# Patient Record
Sex: Male | Born: 1940 | Race: White | Hispanic: No | Marital: Married | Smoking: Never smoker
Health system: Southern US, Community
[De-identification: ages and names within clinical notes are randomized; demographics above are authoritative.]

## PROBLEM LIST (undated history)

## (undated) DIAGNOSIS — D472 Monoclonal gammopathy: Secondary | ICD-10-CM

## (undated) DIAGNOSIS — R7303 Prediabetes: Secondary | ICD-10-CM

## (undated) DIAGNOSIS — Z905 Acquired absence of kidney: Secondary | ICD-10-CM

## (undated) DIAGNOSIS — D369 Benign neoplasm, unspecified site: Secondary | ICD-10-CM

## (undated) DIAGNOSIS — C61 Malignant neoplasm of prostate: Secondary | ICD-10-CM

## (undated) DIAGNOSIS — N189 Chronic kidney disease, unspecified: Secondary | ICD-10-CM

## (undated) DIAGNOSIS — I10 Essential (primary) hypertension: Secondary | ICD-10-CM

## (undated) DIAGNOSIS — M1990 Unspecified osteoarthritis, unspecified site: Secondary | ICD-10-CM

## (undated) DIAGNOSIS — E785 Hyperlipidemia, unspecified: Secondary | ICD-10-CM

## (undated) DIAGNOSIS — Z8739 Personal history of other diseases of the musculoskeletal system and connective tissue: Secondary | ICD-10-CM

## (undated) DIAGNOSIS — Z87442 Personal history of urinary calculi: Secondary | ICD-10-CM

## (undated) DIAGNOSIS — I214 Non-ST elevation (NSTEMI) myocardial infarction: Secondary | ICD-10-CM

## (undated) DIAGNOSIS — C9000 Multiple myeloma not having achieved remission: Secondary | ICD-10-CM

## (undated) HISTORY — DX: Monoclonal gammopathy: D47.2

## (undated) HISTORY — DX: Malignant neoplasm of prostate: C61

## (undated) HISTORY — PX: CORONARY ANGIOPLASTY WITH STENT PLACEMENT: SHX49

## (undated) HISTORY — PX: INSERTION PROSTATE RADIATION SEED: SUR718

## (undated) HISTORY — PX: TONSILLECTOMY: SUR1361

## (undated) HISTORY — DX: Essential (primary) hypertension: I10

## (undated) HISTORY — DX: Prediabetes: R73.03

## (undated) HISTORY — DX: Hyperlipidemia, unspecified: E78.5

## (undated) HISTORY — DX: Benign neoplasm, unspecified site: D36.9

## (undated) HISTORY — DX: Chronic kidney disease, unspecified: N18.9

---
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
## 1982-05-13 HISTORY — PX: NEPHRECTOMY: SHX65

## 1982-05-13 HISTORY — PX: CARDIAC CATHETERIZATION: SHX172

## 1991-05-14 DIAGNOSIS — D369 Benign neoplasm, unspecified site: Secondary | ICD-10-CM

## 1991-05-14 HISTORY — DX: Benign neoplasm, unspecified site: D36.9

## 2002-08-14 ENCOUNTER — Emergency Department (HOSPITAL_COMMUNITY): Admission: EM | Admit: 2002-08-14 | Discharge: 2002-08-14 | Payer: Self-pay | Admitting: Emergency Medicine

## 2002-08-14 ENCOUNTER — Encounter: Payer: Self-pay | Admitting: Emergency Medicine

## 2004-05-13 DIAGNOSIS — C61 Malignant neoplasm of prostate: Secondary | ICD-10-CM

## 2004-05-13 HISTORY — DX: Malignant neoplasm of prostate: C61

## 2005-01-29 ENCOUNTER — Ambulatory Visit: Admission: RE | Admit: 2005-01-29 | Discharge: 2005-04-29 | Payer: Self-pay | Admitting: *Deleted

## 2005-05-17 ENCOUNTER — Ambulatory Visit (HOSPITAL_BASED_OUTPATIENT_CLINIC_OR_DEPARTMENT_OTHER): Admission: RE | Admit: 2005-05-17 | Discharge: 2005-05-17 | Payer: Self-pay | Admitting: Urology

## 2005-07-18 ENCOUNTER — Ambulatory Visit: Admission: RE | Admit: 2005-07-18 | Discharge: 2005-08-30 | Payer: Self-pay | Admitting: *Deleted

## 2009-06-22 ENCOUNTER — Ambulatory Visit: Payer: Self-pay | Admitting: Oncology

## 2009-06-26 LAB — COMPREHENSIVE METABOLIC PANEL
Albumin: 4 g/dL (ref 3.5–5.2)
BUN: 17 mg/dL (ref 6–23)
Chloride: 103 mEq/L (ref 96–112)
Creatinine, Ser: 1.48 mg/dL (ref 0.40–1.50)
Glucose, Bld: 162 mg/dL — ABNORMAL HIGH (ref 70–99)
Potassium: 4.2 mEq/L (ref 3.5–5.3)

## 2009-06-26 LAB — CBC WITH DIFFERENTIAL/PLATELET
Basophils Absolute: 0 10*3/uL (ref 0.0–0.1)
Eosinophils Absolute: 0.2 10*3/uL (ref 0.0–0.5)
HCT: 46.3 % (ref 38.4–49.9)
HGB: 15.9 g/dL (ref 13.0–17.1)
LYMPH%: 32.6 % (ref 14.0–49.0)
MCH: 30.2 pg (ref 27.2–33.4)
MONO#: 0.5 10*3/uL (ref 0.1–0.9)
MONO%: 8.1 % (ref 0.0–14.0)
NEUT#: 3.4 10*3/uL (ref 1.5–6.5)
NEUT%: 55.8 % (ref 39.0–75.0)
lymph#: 2 10*3/uL (ref 0.9–3.3)

## 2009-06-26 LAB — MORPHOLOGY
PLT EST: ADEQUATE
RBC Comments: NORMAL

## 2009-06-28 LAB — TSH: TSH: 1.123 u[IU]/mL (ref 0.350–4.500)

## 2009-06-28 LAB — SPEP & IFE WITH QIG
IgA: 302 mg/dL (ref 68–378)
IgG (Immunoglobin G), Serum: 499 mg/dL — ABNORMAL LOW (ref 694–1618)
IgM, Serum: 29 mg/dL — ABNORMAL LOW (ref 60–263)
M-Spike, %: 0.3 g/dL

## 2009-06-28 LAB — KAPPA/LAMBDA LIGHT CHAINS: Kappa free light chain: <0.6 mg/dL (ref 0.33–1.94)

## 2009-06-28 LAB — VITAMIN B12: Vitamin B-12: 549 pg/mL (ref 211–911)

## 2009-06-30 ENCOUNTER — Ambulatory Visit (HOSPITAL_COMMUNITY): Admission: RE | Admit: 2009-06-30 | Discharge: 2009-06-30 | Payer: Self-pay | Admitting: Internal Medicine

## 2009-06-30 IMAGING — CR DG BONE SURVEY MET
10 series · 10 of 10 positions shown · non-contrast
Comparison: None.

CLINICAL DATA: Abdominal pain and weight loss.  Evaluate for
multiple myeloma.  History of renal cell carcinoma and prostate
cancer.

METASTATIC BONE SURVEY

[w skull lat]
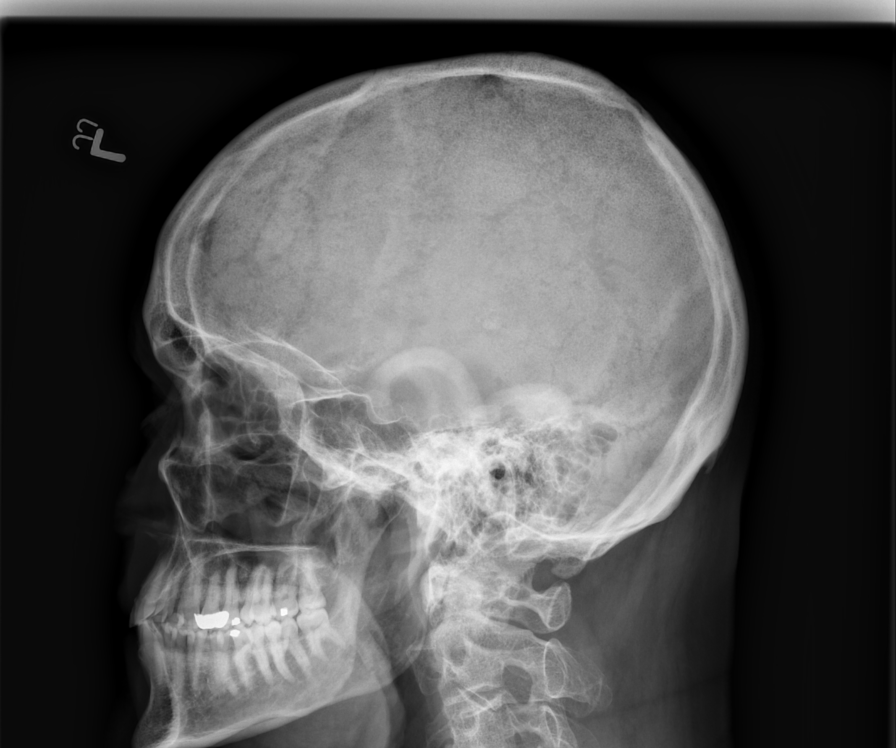

[w c-spine lat]
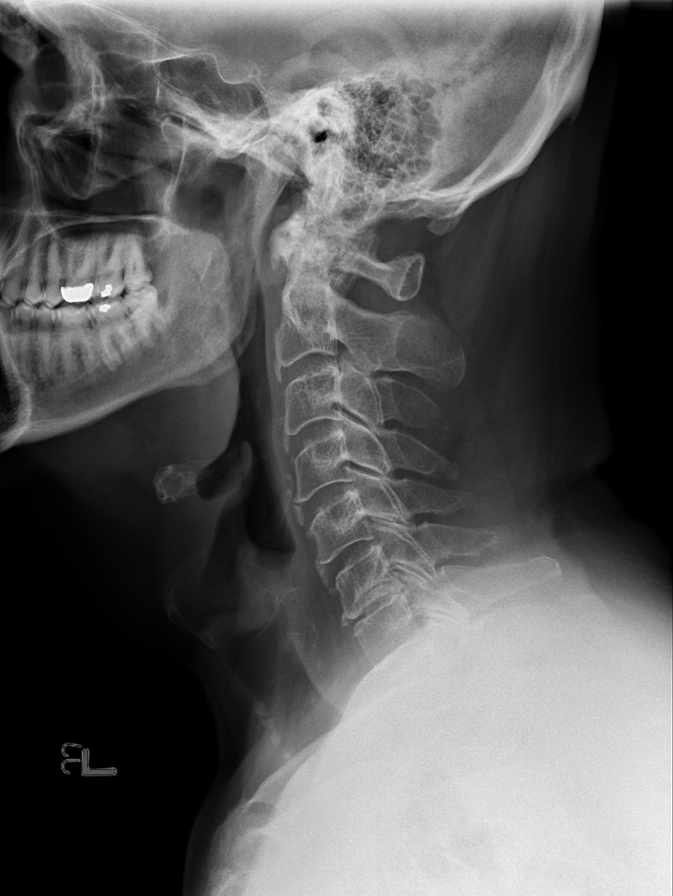

[t c-spine a.p.]
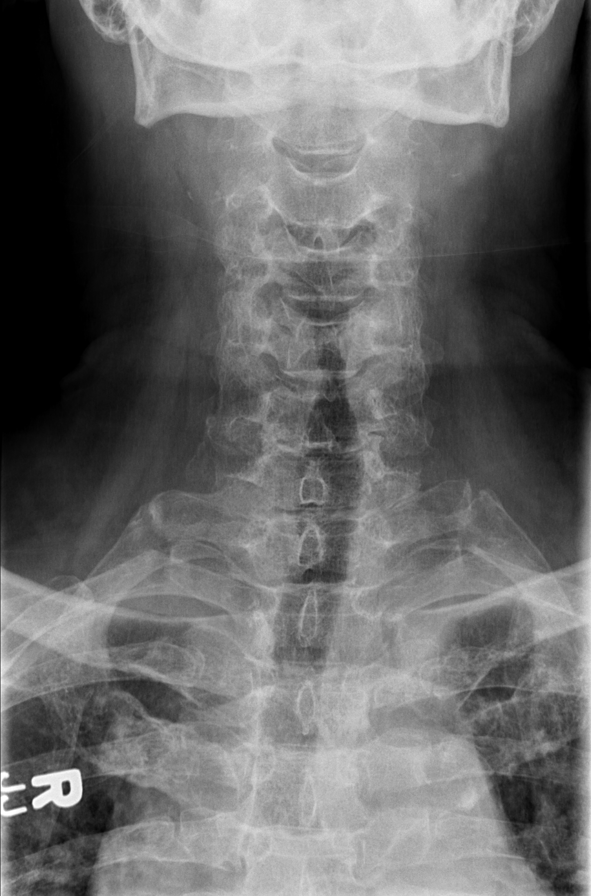

[t t-spine a.p.]
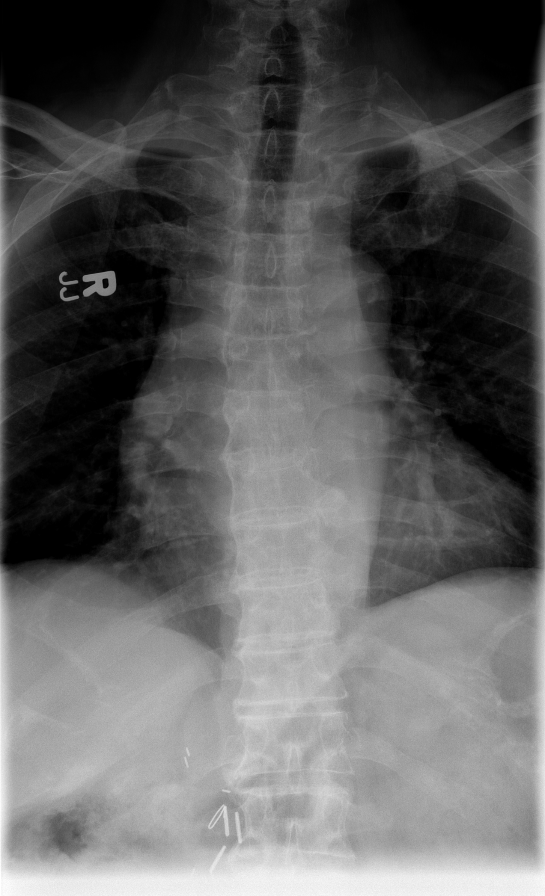

[t l-spine a.p.]
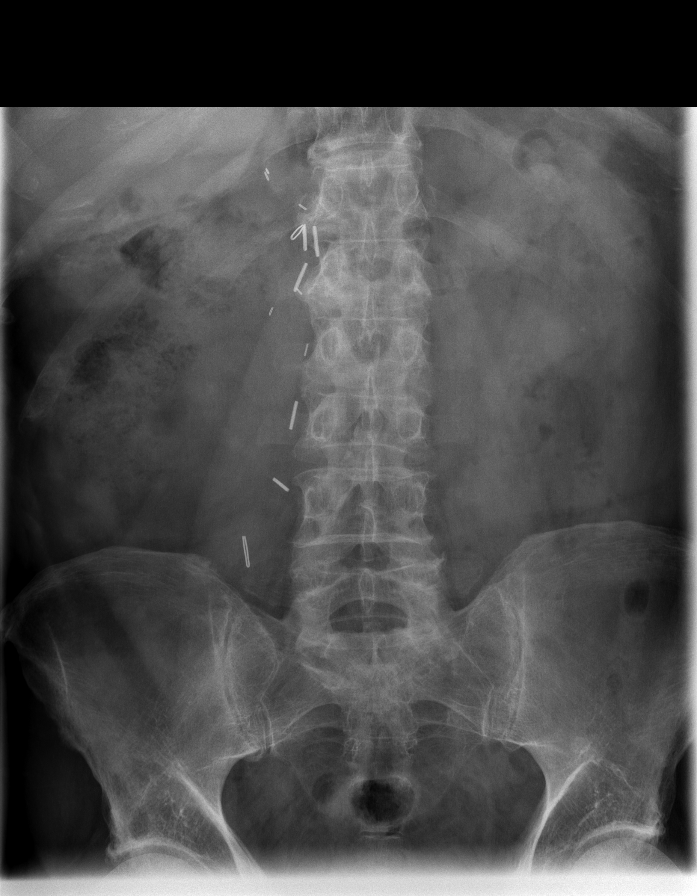

[t pelvis a.p.]
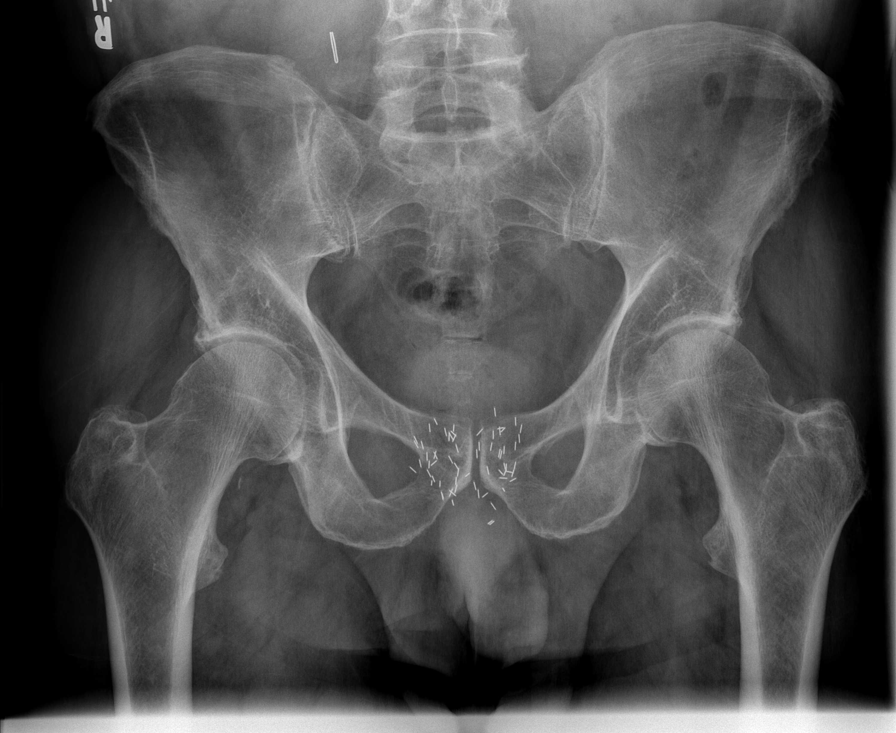

[t femur with hip  ap left]
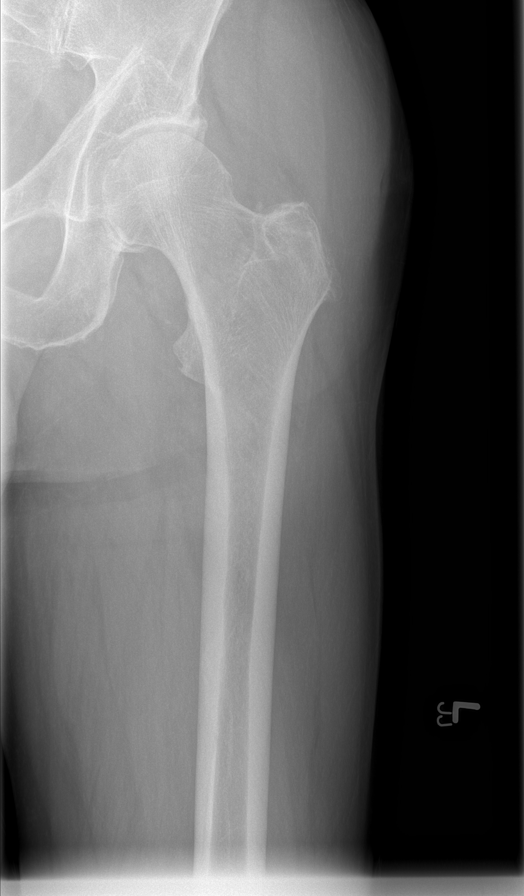

[t femur with knee ap left]
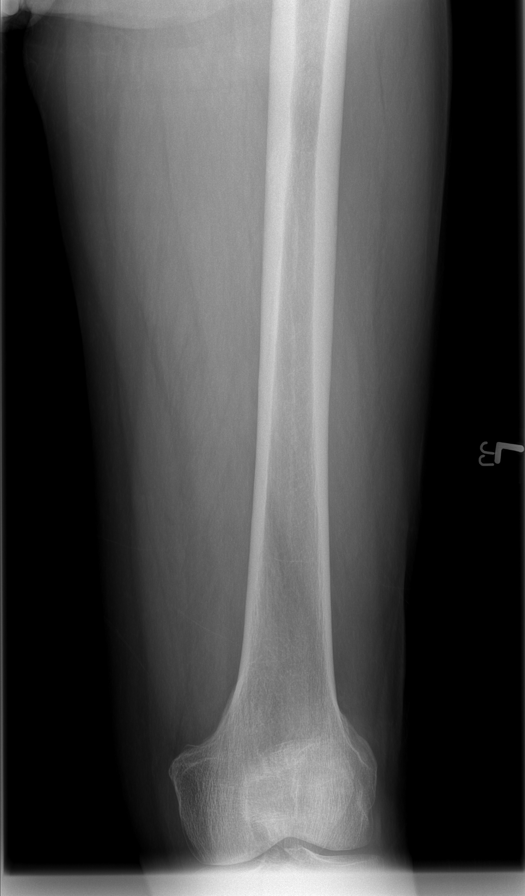

[t femur with hip  ap right]
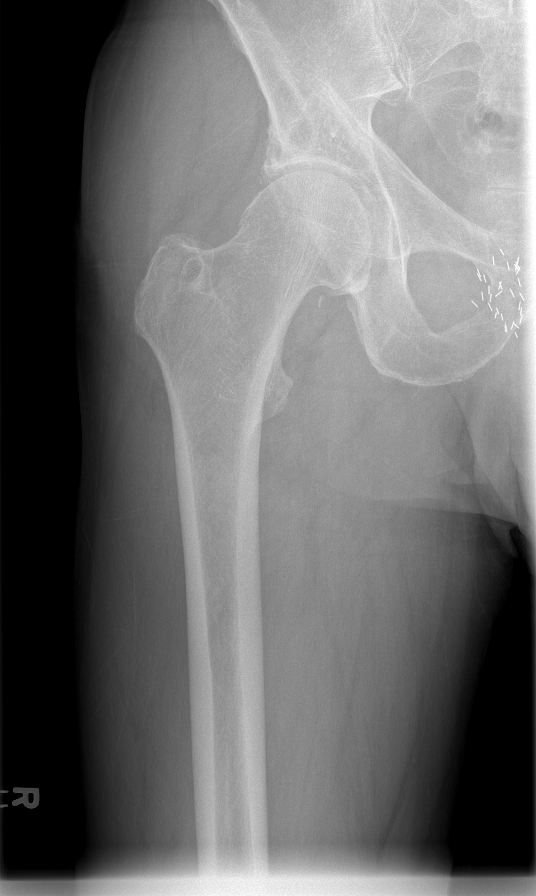

[t femur with knee ap right]
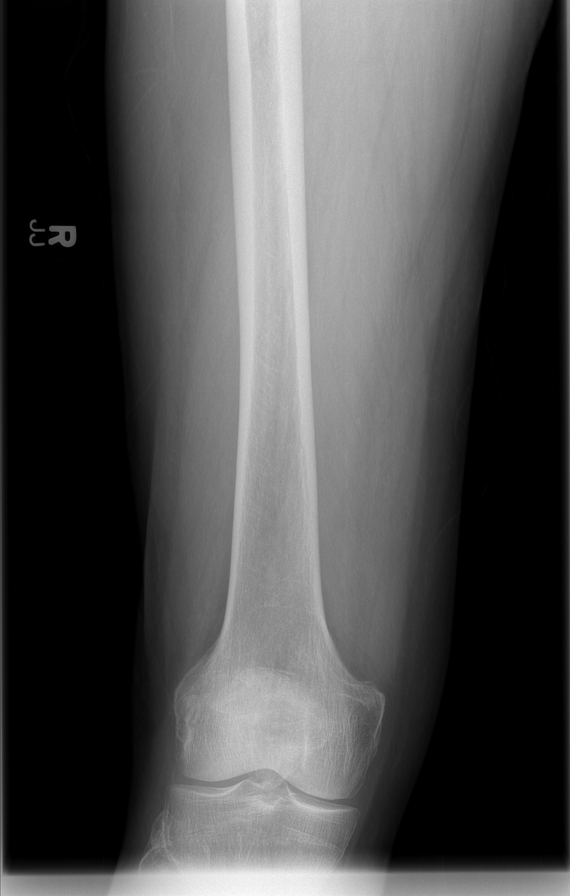

[10 of 10 positions shown; findings below may reference images not displayed]

FINDINGS: They are two small areas of lucency along the frontal and
parietal bones, on the lateral view.  No additional lucent or lytic
lesions in the visualized osseous structures.  Degenerative changes
are seen throughout the spine, with flowing anterior osteophytosis
in the thoracic spine.  Mild degenerative changes are seen in the
hips.  There are brachytherapy seeds in the prostate.
IMPRESSION: Two small lucent areas within the calvarium.  Without prior exams,
it is difficult to exclude lytic lesions.  Artifact is considered.
Attention on follow-up exams is warranted.

## 2009-07-03 LAB — UIFE/LIGHT CHAINS/TP QN, 24-HR UR
Free Kappa/Lambda Ratio: 7.9 ratio — ABNORMAL HIGH (ref 0.46–4.00)
Free Lambda Excretion/Day: 1.94 mg/d
Free Lambda Lt Chains,Ur: 0.21 mg/dL (ref 0.08–1.01)
Time: 24 hours
Total Protein, Urine-Ur/day: 22 mg/d (ref 10–140)
Total Protein, Urine: 2.4 mg/dL

## 2009-08-04 ENCOUNTER — Ambulatory Visit (HOSPITAL_COMMUNITY): Admission: RE | Admit: 2009-08-04 | Discharge: 2009-08-04 | Payer: Self-pay | Admitting: Internal Medicine

## 2009-09-25 ENCOUNTER — Ambulatory Visit: Payer: Self-pay | Admitting: Oncology

## 2009-09-26 LAB — CBC WITH DIFFERENTIAL/PLATELET
Basophils Absolute: 0 10*3/uL (ref 0.0–0.1)
Eosinophils Absolute: 0.1 10*3/uL (ref 0.0–0.5)
LYMPH%: 29.8 % (ref 14.0–49.0)
MCV: 88.3 fL (ref 79.3–98.0)
MONO#: 0.5 10*3/uL (ref 0.1–0.9)
NEUT#: 4 10*3/uL (ref 1.5–6.5)
NEUT%: 60.5 % (ref 39.0–75.0)
RBC: 5.09 10*6/uL (ref 4.20–5.82)
RDW: 13 % (ref 11.0–14.6)
lymph#: 2 10*3/uL (ref 0.9–3.3)

## 2009-09-28 LAB — COMPREHENSIVE METABOLIC PANEL
ALT: 31 U/L (ref 0–53)
Alkaline Phosphatase: 94 U/L (ref 39–117)
Creatinine, Ser: 1.5 mg/dL (ref 0.40–1.50)
Total Bilirubin: 0.6 mg/dL (ref 0.3–1.2)
Total Protein: 6.5 g/dL (ref 6.0–8.3)

## 2009-09-28 LAB — PROTEIN ELECTROPHORESIS, SERUM
Albumin ELP: 65.5 % (ref 55.8–66.1)
Alpha-1-Globulin: 4.5 % (ref 2.9–4.9)
Beta 2: 4.2 % (ref 3.2–6.5)
Beta Globulin: 6.9 % (ref 4.7–7.2)

## 2009-09-28 LAB — IGG, IGA, IGM: IgA: 283 mg/dL (ref 68–378)

## 2010-02-05 ENCOUNTER — Ambulatory Visit: Payer: Self-pay | Admitting: Oncology

## 2010-02-07 LAB — CBC WITH DIFFERENTIAL/PLATELET
BASO%: 0.7 % (ref 0.0–2.0)
Basophils Absolute: 0 10*3/uL (ref 0.0–0.1)
EOS%: 1.5 % (ref 0.0–7.0)
Eosinophils Absolute: 0.1 10*3/uL (ref 0.0–0.5)
HCT: 43.1 % (ref 38.4–49.9)
MCHC: 35 g/dL (ref 32.0–36.0)
MONO#: 0.5 10*3/uL (ref 0.1–0.9)
MONO%: 7 % (ref 0.0–14.0)
NEUT%: 62.7 % (ref 39.0–75.0)
RBC: 4.84 10*6/uL (ref 4.20–5.82)
RDW: 13.9 % (ref 11.0–14.6)
WBC: 6.8 10*3/uL (ref 4.0–10.3)

## 2010-02-13 LAB — PROTEIN ELECTROPHORESIS, SERUM
Alpha-2-Globulin: 8.6 % (ref 7.1–11.8)
Beta Globulin: 6.6 % (ref 4.7–7.2)

## 2010-02-13 LAB — KAPPA/LAMBDA LIGHT CHAINS: Lambda Free Lght Chn: 1.49 mg/dL (ref 0.57–2.63)

## 2010-02-13 LAB — COMPREHENSIVE METABOLIC PANEL
ALT: 16 U/L (ref 0–53)
AST: 16 U/L (ref 0–37)
Creatinine, Ser: 1.59 mg/dL — ABNORMAL HIGH (ref 0.40–1.50)
Potassium: 4.3 mEq/L (ref 3.5–5.3)
Total Bilirubin: 0.6 mg/dL (ref 0.3–1.2)

## 2010-05-24 ENCOUNTER — Ambulatory Visit: Payer: Self-pay | Admitting: Oncology

## 2010-05-28 LAB — CBC WITH DIFFERENTIAL/PLATELET
BASO%: 0.6 % (ref 0.0–2.0)
Basophils Absolute: 0 10*3/uL (ref 0.0–0.1)
EOS%: 1 % (ref 0.0–7.0)
Eosinophils Absolute: 0.1 10*3/uL (ref 0.0–0.5)
HCT: 46.3 % (ref 38.4–49.9)
HGB: 15.8 g/dL (ref 13.0–17.1)
LYMPH%: 26.1 % (ref 14.0–49.0)
MCH: 30.4 pg (ref 27.2–33.4)
MCHC: 34.2 g/dL (ref 32.0–36.0)
MCV: 88.9 fL (ref 79.3–98.0)
MONO#: 0.5 10*3/uL (ref 0.1–0.9)
MONO%: 6.3 % (ref 0.0–14.0)
NEUT#: 5.1 10*3/uL (ref 1.5–6.5)
NEUT%: 66 % (ref 39.0–75.0)
Platelets: 155 10*3/uL (ref 140–400)
RBC: 5.21 10*6/uL (ref 4.20–5.82)
RDW: 13.5 % (ref 11.0–14.6)
WBC: 7.8 10*3/uL (ref 4.0–10.3)
lymph#: 2 10*3/uL (ref 0.9–3.3)

## 2010-05-30 LAB — COMPREHENSIVE METABOLIC PANEL
ALT: 20 U/L (ref 0–53)
AST: 19 U/L (ref 0–37)
Albumin: 4.5 g/dL (ref 3.5–5.2)
Alkaline Phosphatase: 83 U/L (ref 39–117)
BUN: 20 mg/dL (ref 6–23)
CO2: 21 mEq/L (ref 19–32)
Calcium: 9.6 mg/dL (ref 8.4–10.5)
Chloride: 103 mEq/L (ref 96–112)
Creatinine, Ser: 1.8 mg/dL — ABNORMAL HIGH (ref 0.40–1.50)
Glucose, Bld: 152 mg/dL — ABNORMAL HIGH (ref 70–99)
Potassium: 4.5 mEq/L (ref 3.5–5.3)
Sodium: 143 mEq/L (ref 135–145)
Total Bilirubin: 0.7 mg/dL (ref 0.3–1.2)
Total Protein: 6.5 g/dL (ref 6.0–8.3)

## 2010-05-30 LAB — PROTEIN ELECTROPHORESIS, SERUM
Albumin ELP: 65.2 % (ref 55.8–66.1)
Alpha-1-Globulin: 4.5 % (ref 2.9–4.9)
Alpha-2-Globulin: 9.7 % (ref 7.1–11.8)
Beta 2: 4.2 % (ref 3.2–6.5)
Beta Globulin: 6.8 % (ref 4.7–7.2)
Gamma Globulin: 9.6 % — ABNORMAL LOW (ref 11.1–18.8)
M-Spike, %: 0.21 g/dL
Total Protein, Serum Electrophoresis: 6.5 g/dL (ref 6.0–8.3)

## 2010-05-30 LAB — KAPPA/LAMBDA LIGHT CHAINS
Kappa free light chain: 0.73 mg/dL (ref 0.33–1.94)
Kappa:Lambda Ratio: 0.57 (ref 0.26–1.65)
Lambda Free Lght Chn: 1.29 mg/dL (ref 0.57–2.63)

## 2010-08-27 ENCOUNTER — Other Ambulatory Visit: Payer: Self-pay | Admitting: Oncology

## 2010-08-27 ENCOUNTER — Encounter (HOSPITAL_BASED_OUTPATIENT_CLINIC_OR_DEPARTMENT_OTHER): Payer: Medicare Other | Admitting: Oncology

## 2010-08-27 DIAGNOSIS — D472 Monoclonal gammopathy: Secondary | ICD-10-CM

## 2010-08-27 LAB — CBC WITH DIFFERENTIAL/PLATELET
Basophils Absolute: 0.1 10*3/uL (ref 0.0–0.1)
LYMPH%: 29.1 % (ref 14.0–49.0)
MCV: 89.7 fL (ref 79.3–98.0)
MONO#: 0.6 10*3/uL (ref 0.1–0.9)
NEUT#: 4 10*3/uL (ref 1.5–6.5)
NEUT%: 58.8 % (ref 39.0–75.0)
RBC: 4.76 10*6/uL (ref 4.20–5.82)
RDW: 13.3 % (ref 11.0–14.6)
WBC: 6.7 10*3/uL (ref 4.0–10.3)
lymph#: 2 10*3/uL (ref 0.9–3.3)

## 2010-08-29 LAB — PROTEIN ELECTROPHORESIS, SERUM
Alpha-1-Globulin: 4.4 % (ref 2.9–4.9)
Alpha-2-Globulin: 9.5 % (ref 7.1–11.8)
Beta 2: 4 % (ref 3.2–6.5)
Gamma Globulin: 9.9 % — ABNORMAL LOW (ref 11.1–18.8)
Total Protein, Serum Electrophoresis: 6.1 g/dL (ref 6.0–8.3)

## 2010-08-29 LAB — COMPREHENSIVE METABOLIC PANEL
ALT: 18 U/L (ref 0–53)
Alkaline Phosphatase: 81 U/L (ref 39–117)
BUN: 28 mg/dL — ABNORMAL HIGH (ref 6–23)
CO2: 26 mEq/L (ref 19–32)
Glucose, Bld: 173 mg/dL — ABNORMAL HIGH (ref 70–99)
Potassium: 4.6 mEq/L (ref 3.5–5.3)
Sodium: 142 mEq/L (ref 135–145)
Total Bilirubin: 0.5 mg/dL (ref 0.3–1.2)

## 2010-09-03 ENCOUNTER — Encounter (HOSPITAL_BASED_OUTPATIENT_CLINIC_OR_DEPARTMENT_OTHER): Payer: Medicare Other | Admitting: Oncology

## 2010-09-03 DIAGNOSIS — D472 Monoclonal gammopathy: Secondary | ICD-10-CM

## 2010-09-03 DIAGNOSIS — I10 Essential (primary) hypertension: Secondary | ICD-10-CM

## 2010-09-03 DIAGNOSIS — N289 Disorder of kidney and ureter, unspecified: Secondary | ICD-10-CM

## 2010-09-03 DIAGNOSIS — E785 Hyperlipidemia, unspecified: Secondary | ICD-10-CM

## 2010-09-28 NOTE — Op Note (Signed)
NAME:  [name], [name] NO.:  [id number]   MEDICAL RECORD NO.:  [id number]          PATIENT TYPE:  AMB   LOCATION:  [facility]                         FACILITY:  [facility]   PHYSICIAN:  Domingo Pulse, M.D.  DATE OF BIRTH:  16-May-1940   DATE OF PROCEDURE:  05/17/2005  DATE OF DISCHARGE:                                 OPERATIVE REPORT   SERVICE:  Urology.   PREOPERATIVE DIAGNOSES:  Carcinoma of the prostate.   POSTOPERATIVE DIAGNOSES:  Carcinoma of the prostate.   PROCEDURE:  Radiation seed implantation plus cystoscopy.   SURGEON:  Domingo Pulse, M.D.   RADIATION ONCOLOGIST:  Ulyess Blossom. Elba Barman, M.D.   ANESTHESIA:  General.   COMPLICATIONS:  None.   DRAINS:  None.   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 The  patient was given options of therapy including extra beam radiation therapy,  radiation seed implantation, radical prostatectomy, robotic radical  prostatectomy, cryosurgery, etc. He was also told that he could consider  watchful waiting but it was not advised as he is felt to be in excellent  condition and likely to have at least 10 years of life expectancy. The  patient has elected to have seed implantation after considering all the pros  and cons of the options. He understands the risks and benefits including the  possibility of urgency, frequency, urgency incontinence, radiation injury,  etc. He gave full informed consent.   DESCRIPTION OF PROCEDURE:  After successful induction of general anesthesia,  the patient was placed in the dorsal lithotomy position, prepped with  Betadine and draped in the usual sterile fashion. The ultrasound probe was  inserted and the patient was positioned so that the image obtained on the  ultrasound was consistent with the preoperative imaging study. The patient  then had his plan developed using the Nucletron system. The  total dose  delivered was 145 cGy. The patient had a total of 24 needles with a total of  68 seeds using I-125. The seeds were placed using the Nucletron system and  what appeared to be an appropriate fashion with no evidence of seed  migration. The patient underwent cystoscopy, the urethra was visualized in  its entirety and was found to be normal beyond the verumontanum. One seed  could be seen partly sticking in the urethra, this was extracted. The  bladder was carefully inspected and no tumors or stones could be seen. The  patient did not have any seeds within the bladder.   The Foley catheter was reinserted, the patient tolerated the procedure well  and was taken to the recovery room in good condition. He will be sent home  with Lorcet plus and Cipro and return to see me in 2 weeks time. He will be  given instructions on how to use a leg bag and will have the Foley catheter  removed in 1-2 days.           ______________________________  Domingo Pulse, M.D.  Electronically Signed  RJE/MEDQ  D:  05/17/2005  T:  05/17/2005  Job:  [id number]   cc:   Ulyess Blossom. Elba Barman, M.D.  Fax: [phone]

## 2010-11-26 ENCOUNTER — Other Ambulatory Visit: Payer: Self-pay | Admitting: Oncology

## 2010-11-26 ENCOUNTER — Encounter (HOSPITAL_BASED_OUTPATIENT_CLINIC_OR_DEPARTMENT_OTHER): Payer: Medicare Other | Admitting: Oncology

## 2010-11-26 DIAGNOSIS — D472 Monoclonal gammopathy: Secondary | ICD-10-CM

## 2010-11-26 LAB — CBC WITH DIFFERENTIAL/PLATELET
Eosinophils Absolute: 0.1 10*3/uL (ref 0.0–0.5)
HCT: 44.3 % (ref 38.4–49.9)
LYMPH%: 32.9 % (ref 14.0–49.0)
MCH: 30.8 pg (ref 27.2–33.4)
MCHC: 34.3 g/dL (ref 32.0–36.0)
MCV: 89.6 fL (ref 79.3–98.0)
MONO#: 0.6 10*3/uL (ref 0.1–0.9)
MONO%: 8.8 % (ref 0.0–14.0)
Platelets: 162 10*3/uL (ref 140–400)
RBC: 4.95 10*6/uL (ref 4.20–5.82)
RDW: 13.8 % (ref 11.0–14.6)
WBC: 6.6 10*3/uL (ref 4.0–10.3)

## 2010-11-28 LAB — COMPREHENSIVE METABOLIC PANEL
BUN: 20 mg/dL (ref 6–23)
CO2: 27 mEq/L (ref 19–32)
Chloride: 102 mEq/L (ref 96–112)
Creatinine, Ser: 1.62 mg/dL — ABNORMAL HIGH (ref 0.50–1.35)
Glucose, Bld: 207 mg/dL — ABNORMAL HIGH (ref 70–99)
Total Bilirubin: 0.6 mg/dL (ref 0.3–1.2)
Total Protein: 6.5 g/dL (ref 6.0–8.3)

## 2010-11-28 LAB — PROTEIN ELECTROPHORESIS, SERUM
Albumin ELP: 66.2 % — ABNORMAL HIGH (ref 55.8–66.1)
Alpha-1-Globulin: 4.4 % (ref 2.9–4.9)
Gamma Globulin: 9.8 % — ABNORMAL LOW (ref 11.1–18.8)
M-Spike, %: 0.29 g/dL
Total Protein, Serum Electrophoresis: 6.5 g/dL (ref 6.0–8.3)

## 2010-11-28 LAB — KAPPA/LAMBDA LIGHT CHAINS: Lambda Free Lght Chn: 1.56 mg/dL (ref 0.57–2.63)

## 2010-12-03 ENCOUNTER — Encounter (HOSPITAL_BASED_OUTPATIENT_CLINIC_OR_DEPARTMENT_OTHER): Payer: Medicare Other | Admitting: Oncology

## 2010-12-03 DIAGNOSIS — I10 Essential (primary) hypertension: Secondary | ICD-10-CM

## 2010-12-03 DIAGNOSIS — D472 Monoclonal gammopathy: Secondary | ICD-10-CM

## 2010-12-03 DIAGNOSIS — E785 Hyperlipidemia, unspecified: Secondary | ICD-10-CM

## 2010-12-03 DIAGNOSIS — N289 Disorder of kidney and ureter, unspecified: Secondary | ICD-10-CM

## 2011-04-18 ENCOUNTER — Encounter: Payer: Self-pay | Admitting: *Deleted

## 2011-04-21 ENCOUNTER — Encounter: Payer: Self-pay | Admitting: Oncology

## 2011-04-21 DIAGNOSIS — N184 Chronic kidney disease, stage 4 (severe): Secondary | ICD-10-CM | POA: Insufficient documentation

## 2011-04-21 DIAGNOSIS — I10 Essential (primary) hypertension: Secondary | ICD-10-CM | POA: Insufficient documentation

## 2011-04-21 DIAGNOSIS — R7303 Prediabetes: Secondary | ICD-10-CM | POA: Insufficient documentation

## 2011-04-21 DIAGNOSIS — N189 Chronic kidney disease, unspecified: Secondary | ICD-10-CM | POA: Insufficient documentation

## 2011-04-21 DIAGNOSIS — L821 Other seborrheic keratosis: Secondary | ICD-10-CM | POA: Insufficient documentation

## 2011-04-21 DIAGNOSIS — E785 Hyperlipidemia, unspecified: Secondary | ICD-10-CM | POA: Insufficient documentation

## 2011-04-21 DIAGNOSIS — D472 Monoclonal gammopathy: Secondary | ICD-10-CM | POA: Insufficient documentation

## 2011-04-22 ENCOUNTER — Other Ambulatory Visit: Payer: Medicare Other | Admitting: Lab

## 2011-04-24 ENCOUNTER — Other Ambulatory Visit: Payer: Self-pay | Admitting: Oncology

## 2011-04-24 ENCOUNTER — Other Ambulatory Visit (HOSPITAL_BASED_OUTPATIENT_CLINIC_OR_DEPARTMENT_OTHER): Payer: Medicare Other | Admitting: Lab

## 2011-04-24 DIAGNOSIS — E785 Hyperlipidemia, unspecified: Secondary | ICD-10-CM

## 2011-04-24 DIAGNOSIS — I10 Essential (primary) hypertension: Secondary | ICD-10-CM

## 2011-04-24 DIAGNOSIS — N289 Disorder of kidney and ureter, unspecified: Secondary | ICD-10-CM

## 2011-04-24 DIAGNOSIS — D472 Monoclonal gammopathy: Secondary | ICD-10-CM

## 2011-04-24 LAB — CBC WITH DIFFERENTIAL/PLATELET
Basophils Absolute: 0 10*3/uL (ref 0.0–0.1)
EOS%: 3.6 % (ref 0.0–7.0)
HCT: 45.2 % (ref 38.4–49.9)
HGB: 15.3 g/dL (ref 13.0–17.1)
MCH: 30.3 pg (ref 27.2–33.4)
MCHC: 33.9 g/dL (ref 32.0–36.0)
MCV: 89.4 fL (ref 79.3–98.0)
MONO%: 7.9 % (ref 0.0–14.0)
NEUT%: 61.3 % (ref 39.0–75.0)
RDW: 14 % (ref 11.0–14.6)

## 2011-04-26 LAB — COMPREHENSIVE METABOLIC PANEL
AST: 14 U/L (ref 0–37)
Alkaline Phosphatase: 82 U/L (ref 39–117)
BUN: 30 mg/dL — ABNORMAL HIGH (ref 6–23)
Creatinine, Ser: 1.75 mg/dL — ABNORMAL HIGH (ref 0.50–1.35)

## 2011-04-26 LAB — PROTEIN ELECTROPHORESIS, SERUM
Alpha-1-Globulin: 4.5 % (ref 2.9–4.9)
Alpha-2-Globulin: 9.7 % (ref 7.1–11.8)
Beta 2: 3.7 % (ref 3.2–6.5)
Beta Globulin: 6.6 % (ref 4.7–7.2)
Gamma Globulin: 9.8 % — ABNORMAL LOW (ref 11.1–18.8)

## 2011-04-29 ENCOUNTER — Ambulatory Visit (HOSPITAL_BASED_OUTPATIENT_CLINIC_OR_DEPARTMENT_OTHER): Payer: Medicare Other | Admitting: Oncology

## 2011-04-29 ENCOUNTER — Telehealth: Payer: Self-pay | Admitting: Oncology

## 2011-04-29 DIAGNOSIS — I10 Essential (primary) hypertension: Secondary | ICD-10-CM

## 2011-04-29 DIAGNOSIS — R7303 Prediabetes: Secondary | ICD-10-CM

## 2011-04-29 DIAGNOSIS — N189 Chronic kidney disease, unspecified: Secondary | ICD-10-CM

## 2011-04-29 DIAGNOSIS — E785 Hyperlipidemia, unspecified: Secondary | ICD-10-CM

## 2011-04-29 DIAGNOSIS — D472 Monoclonal gammopathy: Secondary | ICD-10-CM

## 2011-04-29 NOTE — Progress Notes (Signed)
[place] OFFICE PROGRESS NOTE  Cc:  Andre Lyons, MD, MD  DIAGNOSIS:  Monoclonal gammopathy of undetermined significance.  CURRENT THERAPY:  watchful observation.  INTERVAL HISTORY: [name] 70 y.o. male returns for regular follow up by himself.  He only mild fatigue in the afternoon every day which requires him to take a short one hour nap.  He otherwise is very active and doing chores around the house. He is independent of all activities daily living.  He denies focal bone pain, paresthesia, bleeding symptoms, chest pain, dyspnea on exertion.  Patient denies headache, visual changes, confusion, drenching night sweats, palpable lymph node swelling, mucositis, odynophagia, dysphagia, nausea vomiting, jaundice, chest pain, palpitation, shortness of breath, dyspnea on exertion, productive cough, gum bleeding, epistaxis, hematemesis, hemoptysis, abdominal pain, abdominal swelling, early satiety, melena, hematochezia, hematuria, skin rash, spontaneous bleeding, joint swelling, joint pain, heat or cold intolerance, bowel bladder incontinence, back pain, focal motor weakness, paresthesia, depression, suicidal or homocidal ideation, feeling hopelessness.   MEDICAL HISTORY: Past Medical History  Diagnosis Date  . MGUS (monoclonal gammopathy of unknown significance)   . 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 Prostate cancer 2006    s/p brachytherapy  . Hypertension   . Migraine   . Chronic kidney disease   . Hyperlipidemia   . Borderline diabetes   . Seborrheic keratosis     SURGICAL HISTORY: No past surgical history on file.  MEDICATIONS: Current Outpatient Prescriptions  Medication Sig Dispense Refill  . enalapril (VASOTEC) 20 MG tablet Take 20 mg by mouth daily.        . metoprolol (TOPROL-XL) 50 MG 24 hr tablet Take 50 mg by mouth daily.        . simvastatin (ZOCOR) 20 MG tablet Take 20 mg by mouth at bedtime.           ALLERGIES:  is allergic to sulfa antibiotics.  REVIEW OF SYSTEMS:  The rest of the 14-point review of system was negative.   Filed Vitals:   04/29/11 1055  BP: 180/87  Pulse: 50  Temp: 98.4 F (36.9 C)   Wt Readings from Last 3 Encounters:  04/29/11 191 lb 6.4 oz (86.818 kg)  12/03/10 191 lb 3.2 oz (86.728 kg)   ECOG Performance status: 0-1  PHYSICAL EXAMINATION:   General:  well-nourished in no acute distress.  Eyes:  no scleral icterus.  ENT:  There were no oropharyngeal lesions.  Neck was without thyromegaly.  Lymphatics:  Negative cervical, supraclavicular or axillary adenopathy.  Respiratory: lungs were clear bilaterally without wheezing or crackles.  Cardiovascular:  Regular rate and rhythm, S1/S2, without murmur, rub or gallop.  There was no pedal edema.  GI:  abdomen was soft, flat, nontender, nondistended, without organomegaly.  Muscoloskeletal:  no spinal tenderness of palpation of vertebral spine.  Skin exam was without echymosis, petichae.  Neuro exam was nonfocal.  Patient was able to get on and off exam table without assistance.  Gait was normal.  Patient was alerted and oriented.  Attention was good.   Language was appropriate.  Mood was normal without depression.  Speech was not pressured.  Thought content was not tangential.     LABORATORY/RADIOLOGY DATA:  Lab Results  Component Value Date   WBC 6.1 04/24/2011   HGB 15.3 04/24/2011   HCT 45.2 04/24/2011   PLT 161 04/24/2011   GLUCOSE 219* 04/24/2011   GLUCOSE 219* 04/24/2011   GLUCOSE 219* 04/24/2011  ALT 17 04/24/2011   ALT 17 04/24/2011   ALT 17 04/24/2011   AST 14 04/24/2011   AST 14 04/24/2011   AST 14 04/24/2011   NA 140 04/24/2011   NA 140 04/24/2011   NA 140 04/24/2011   K 4.4 04/24/2011   K 4.4 04/24/2011   K 4.4 04/24/2011   CL 102 04/24/2011   CL 102 04/24/2011   CL 102 04/24/2011   CREATININE 1.75* 04/24/2011   CREATININE 1.75* 04/24/2011   CREATININE 1.75* 04/24/2011   BUN 30*  04/24/2011   BUN 30* 04/24/2011   BUN 30* 04/24/2011   CO2 30 04/24/2011   CO2 30 04/24/2011   CO2 30 04/24/2011   TSH 1.123 06/26/2009    ASSESSMENT AND PLAN:   1. Monoclonal gammopathy of undetermined significance (MGUS):  Again, does not have any evidence of MGUS progression to active myeloma.  His creatinine is stable.  He does not have anemia, thrombocytopenia, or hypercalcemia. In the future, if there is significant worsening in renal functions, M-spike increasing, or any concerning signs of progression, then repeat bone marrow biopsy may be indicated.  2. Renal insufficiency:  stable Cr, most likely secondary to his hypertension history.  He is also s/p right nephrectomy from history of oncocytoma in 1993.  3. Hypertension.  He is on enalapril and metoprolol per Dr. Reynaldo Townsend.  He said that the elevated BP today is white coat syndrome.  At home, his BP is consistently below 140's/80's.  4. Hyperlipidemia.  He is on simvastatin per PCP.  5. Borderline diabetes:  Diet control  6. Seborrheic keratosis.  F/u with Dermatology.  7. Follow up with me in 5-6 months.  I advised him to contact us sooner if he is concerning symptoms like to see fatigue, bleeding, recurrent infection, drenching night sweats, severe focal bone pain.

## 2011-04-29 NOTE — Telephone Encounter (Signed)
gv pt appt schedule for June. Date per pt due to out of town may. Ok per [facility].

## 2011-10-17 ENCOUNTER — Other Ambulatory Visit (HOSPITAL_BASED_OUTPATIENT_CLINIC_OR_DEPARTMENT_OTHER): Payer: Medicare Other | Admitting: Lab

## 2011-10-17 DIAGNOSIS — I10 Essential (primary) hypertension: Secondary | ICD-10-CM

## 2011-10-17 DIAGNOSIS — E785 Hyperlipidemia, unspecified: Secondary | ICD-10-CM

## 2011-10-17 DIAGNOSIS — D472 Monoclonal gammopathy: Secondary | ICD-10-CM

## 2011-10-17 DIAGNOSIS — R7303 Prediabetes: Secondary | ICD-10-CM

## 2011-10-17 DIAGNOSIS — N189 Chronic kidney disease, unspecified: Secondary | ICD-10-CM

## 2011-10-17 DIAGNOSIS — N289 Disorder of kidney and ureter, unspecified: Secondary | ICD-10-CM

## 2011-10-17 LAB — CBC WITH DIFFERENTIAL/PLATELET
Basophils Absolute: 0 10*3/uL (ref 0.0–0.1)
EOS%: 2 % (ref 0.0–7.0)
Eosinophils Absolute: 0.1 10*3/uL (ref 0.0–0.5)
HCT: 42.6 % (ref 38.4–49.9)
HGB: 15 g/dL (ref 13.0–17.1)
LYMPH%: 34.9 % (ref 14.0–49.0)
MCH: 30.2 pg (ref 27.2–33.4)
MCV: 85.7 fL (ref 79.3–98.0)
MONO%: 7.2 % (ref 0.0–14.0)
NEUT%: 55.6 % (ref 39.0–75.0)
Platelets: 150 10*3/uL (ref 140–400)

## 2011-10-21 LAB — PROTEIN ELECTROPHORESIS, SERUM
Alpha-1-Globulin: 4 % (ref 2.9–4.9)
Alpha-2-Globulin: 8.7 % (ref 7.1–11.8)
M-Spike, %: 0.21 g/dL
Total Protein, Serum Electrophoresis: 5.8 g/dL — ABNORMAL LOW (ref 6.0–8.3)

## 2011-10-21 LAB — COMPREHENSIVE METABOLIC PANEL
AST: 17 U/L (ref 0–37)
Alkaline Phosphatase: 66 U/L (ref 39–117)
BUN: 24 mg/dL — ABNORMAL HIGH (ref 6–23)
Creatinine, Ser: 1.54 mg/dL — ABNORMAL HIGH (ref 0.50–1.35)
Glucose, Bld: 141 mg/dL — ABNORMAL HIGH (ref 70–99)

## 2011-10-21 LAB — KAPPA/LAMBDA LIGHT CHAINS
Kappa:Lambda Ratio: 0.65 (ref 0.26–1.65)
Lambda Free Lght Chn: 1.48 mg/dL (ref 0.57–2.63)

## 2011-10-23 NOTE — Patient Instructions (Addendum)
1. Monoclonal gammopathy of undermined significance (MGUS):  No evidence of progression to active myeloma.  There is no anemia, hypercalcemia, bone lesions.  Chronic kidney disease is not due to myeloma in this case.  Your abnormal protein level is stable without progression. 2. Recommendation:  Continue to observe your protein level (M-spike) every 4-6 months.  In the future, if there is sign of progression from MGUS to active myeloma, we may consider bone marrow biopsy.

## 2011-10-24 ENCOUNTER — Ambulatory Visit (HOSPITAL_BASED_OUTPATIENT_CLINIC_OR_DEPARTMENT_OTHER): Payer: Medicare Other | Admitting: Oncology

## 2011-10-24 ENCOUNTER — Telehealth: Payer: Self-pay | Admitting: Oncology

## 2011-10-24 VITALS — BP 173/87 | HR 60 | Temp 97.8°F | Ht 72.0 in | Wt 191.3 lb

## 2011-10-24 DIAGNOSIS — D472 Monoclonal gammopathy: Secondary | ICD-10-CM

## 2011-10-24 DIAGNOSIS — I10 Essential (primary) hypertension: Secondary | ICD-10-CM

## 2011-10-24 DIAGNOSIS — N289 Disorder of kidney and ureter, unspecified: Secondary | ICD-10-CM

## 2011-10-24 DIAGNOSIS — N189 Chronic kidney disease, unspecified: Secondary | ICD-10-CM

## 2011-10-24 NOTE — Progress Notes (Signed)
[place]  Telephone:([phone] Fax:([phone]   OFFICE PROGRESS NOTE   Cc:  ARONSON,RICHARD A, MD  DIAGNOSIS: Monoclonal gammopathy of undetermined significance (MGUS).   CURRENT THERAPY: watchful observation.    INTERVAL HISTORY: [name] 71 y.o. male returns for regular follow up by himself. He reports he is slight fatigue especially after lunch around 2:00. He said that almost every day he he said that his TSH with Dr. Reynaldo Minium was normal last year. He denies recurrent infections such as pneumonia, urinary tract nfection, cellulitis, abscess. He has chronic mild diffuse bone pain however nothing focal or severe enough to require chronic pain medication. He has good appetite and relatively stable weight. He is independent of all activities of daily living.  Patient denies headache, visual changes, confusion, drenching night sweats, palpable lymph node swelling, mucositis, odynophagia, dysphagia, nausea vomiting, jaundice, chest pain, palpitation, shortness of breath, dyspnea on exertion, productive cough, gum bleeding, epistaxis, hematemesis, hemoptysis, abdominal pain, abdominal swelling, early satiety, melena, hematochezia, hematuria, skin rash, spontaneous bleeding, joint swelling, heat or cold intolerance, bowel bladder incontinence, back pain, focal motor weakness, paresthesia, depression, suicidal or homocidal ideation, feeling hopelessness.   Past Medical History  Diagnosis Date  . MGUS (monoclonal gammopathy of unknown significance)   . 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 Prostate cancer 2006    s/p brachytherapy  . Hypertension   . Migraine   . Chronic kidney disease   . Hyperlipidemia   . Borderline diabetes   . Seborrheic keratosis     No past surgical history on file.  Current Outpatient Prescriptions  Medication Sig Dispense Refill  . lisinopril-hydrochlorothiazide (PRINZIDE,ZESTORETIC) 20-12.5 MG per  tablet Take 1 tablet by mouth daily.      Marland Kitchen BYSTOLIC 20 MG TABS Take 20 mg by mouth. daily      . COLCRYS 0.6 MG tablet Take 0.6 mg by mouth. prn      . metoprolol (TOPROL-XL) 50 MG 24 hr tablet Take 50 mg by mouth daily.        . simvastatin (ZOCOR) 20 MG tablet Take 20 mg by mouth at bedtime.          ALLERGIES:  is allergic to sulfa antibiotics.  REVIEW OF SYSTEMS:  The rest of the 14-point review of system was negative.   Filed Vitals:   10/24/11 0903  BP: 173/87  Pulse: 60  Temp: 97.8 F (36.6 C)   Wt Readings from Last 3 Encounters:  10/24/11 191 lb 4.8 oz (86.773 kg)  04/29/11 191 lb 6.4 oz (86.818 kg)  12/03/10 191 lb 3.2 oz (86.728 kg)   ECOG Performance status: 1  PHYSICAL EXAMINATION:   General:  well-nourished man, in no acute distress.  Eyes:  no scleral icterus.  ENT:  There were no oropharyngeal lesions.  Neck was without thyromegaly.  Lymphatics:  Negative cervical, supraclavicular or axillary adenopathy.  Respiratory: lungs were clear bilaterally without wheezing or crackles.  Cardiovascular:  Regular rate and rhythm, S1/S2, without murmur, rub or gallop.  There was no pedal edema.  GI:  abdomen was soft, flat, nontender, nondistended, without organomegaly.  Muscoloskeletal:  no spinal tenderness of palpation of vertebral spine.  Skin exam was without echymosis, petichae.  Neuro exam was nonfocal.  Patient was able to get on and off exam table without assistance.  Gait was normal.  Patient was alerted and oriented.  Attention was good.   Language was appropriate.  Mood was normal without depression.  Speech was not pressured.  Thought content was not tangential.     LABORATORY/RADIOLOGY DATA:  Lab Results  Component Value Date   WBC 6.0 10/17/2011   HGB 15.0 10/17/2011   HCT 42.6 10/17/2011   PLT 150 10/17/2011   GLUCOSE 141* 10/17/2011   ALKPHOS 66 10/17/2011   ALT 19 10/17/2011   AST 17 10/17/2011   NA 140 10/17/2011   K 4.3 10/17/2011   CL 102 10/17/2011   CREATININE 1.54*  10/17/2011   BUN 24* 10/17/2011   CO2 30 10/17/2011    ASSESSMENT AND PLAN:    1. Monoclonal gammopathy of undetermined significance (MGUS):  No evidence of progression to active myeloma.  There is no anemia, hypercalcemia, bone lesions.  Chronic kidney disease is not due to myeloma in this case.  His S-spike level is stable.  I again recommend observation. In the future, if there is sign of progression from MGUS to active myeloma, we may consider bone marrow biopsy.  2. Renal insufficiency: stable Cr, most likely secondary to his hypertension history. He is also s/p right nephrectomy from history of oncocytoma in 1993.  3. Hypertension. He is on Lisinopril/HCTZ and metoprolol per PCP. 4. Hyperlipidemia. He is on simvastatin per PCP.  5. Borderline diabetes: Diet control  6. Seborrheic keratosis. F/u with Dermatology.  7. Follow up with me in about 6 months.   The length of time of the face-to-face encounter was 15 minutes. More than 50% of time was spent counseling and coordination of care.

## 2011-10-24 NOTE — Telephone Encounter (Signed)
appts made and printed for pt aom °

## 2012-04-20 ENCOUNTER — Other Ambulatory Visit (HOSPITAL_BASED_OUTPATIENT_CLINIC_OR_DEPARTMENT_OTHER): Payer: Medicare Other | Admitting: Lab

## 2012-04-20 DIAGNOSIS — N189 Chronic kidney disease, unspecified: Secondary | ICD-10-CM

## 2012-04-20 DIAGNOSIS — D472 Monoclonal gammopathy: Secondary | ICD-10-CM

## 2012-04-20 LAB — CBC WITH DIFFERENTIAL/PLATELET
BASO%: 1 % (ref 0.0–2.0)
HCT: 44.1 % (ref 38.4–49.9)
HGB: 15.3 g/dL (ref 13.0–17.1)
MCHC: 34.6 g/dL (ref 32.0–36.0)
MONO#: 0.7 10*3/uL (ref 0.1–0.9)
NEUT#: 3.6 10*3/uL (ref 1.5–6.5)
NEUT%: 55.2 % (ref 39.0–75.0)
WBC: 6.5 10*3/uL (ref 4.0–10.3)
lymph#: 1.9 10*3/uL (ref 0.9–3.3)

## 2012-04-22 LAB — KAPPA/LAMBDA LIGHT CHAINS
Kappa free light chain: 0.78 mg/dL (ref 0.33–1.94)
Lambda Free Lght Chn: 1.83 mg/dL (ref 0.57–2.63)

## 2012-04-22 LAB — PROTEIN ELECTROPHORESIS, SERUM
Alpha-1-Globulin: 8 % — ABNORMAL HIGH (ref 2.9–4.9)
Alpha-2-Globulin: 10 % (ref 7.1–11.8)
Gamma Globulin: 9.5 % — ABNORMAL LOW (ref 11.1–18.8)

## 2012-04-27 ENCOUNTER — Ambulatory Visit (HOSPITAL_BASED_OUTPATIENT_CLINIC_OR_DEPARTMENT_OTHER): Payer: Medicare Other | Admitting: Oncology

## 2012-04-27 ENCOUNTER — Telehealth: Payer: Self-pay | Admitting: Oncology

## 2012-04-27 ENCOUNTER — Encounter: Payer: Self-pay | Admitting: Oncology

## 2012-04-27 VITALS — BP 179/92 | HR 55 | Temp 96.8°F | Resp 18 | Ht 72.0 in | Wt 190.8 lb

## 2012-04-27 DIAGNOSIS — D472 Monoclonal gammopathy: Secondary | ICD-10-CM

## 2012-04-27 NOTE — Telephone Encounter (Signed)
appts made and printed for pt aom °

## 2012-04-27 NOTE — Patient Instructions (Addendum)
1. Monoclonal gammopathy of undermined significance (MGUS): No evidence of progression to active myeloma. There is no anemia, hypercalcemia, bone lesions. Chronic kidney disease is not due to myeloma in this case. Your abnormal protein level is stable without progression. 2. Recommendation: Continue to observe your protein level (M-spike) every 6 months. In the future, if there is sign of progression from MGUS to active myeloma, we may consider bone marrow biopsy.

## 2012-04-27 NOTE — Progress Notes (Signed)
[place]  Telephone:(336) [phone] Fax:([phone]   OFFICE PROGRESS NOTE   Cc:  ARONSON,RICHARD A, MD  DIAGNOSIS: Monoclonal gammopathy of undetermined significance (MGUS).   CURRENT THERAPY: watchful observation.  INTERVAL HISTORY: [name] 71 y.o. male returns for regular follow up by himself. He reports he is slight fatigue especially after lunch around 2:00. He denies recurrent infections such as pneumonia, urinary tract nfection, cellulitis, abscess. He has chronic mild diffuse bone pain however nothing focal or severe enough to require chronic pain medication. He has good appetite and relatively stable weight. He is independent of all activities of daily living.  Patient denies headache, visual changes, confusion, drenching night sweats, palpable lymph node swelling, mucositis, odynophagia, dysphagia, nausea vomiting, jaundice, chest pain, palpitation, shortness of breath, dyspnea on exertion, productive cough, gum bleeding, epistaxis, hematemesis, hemoptysis, abdominal pain, abdominal swelling, early satiety, melena, hematochezia, hematuria, skin rash, spontaneous bleeding, joint swelling, heat or cold intolerance, bowel bladder incontinence, back pain, focal motor weakness, paresthesia, depression, suicidal or homocidal ideation, feeling hopelessness.   Past Medical History  Diagnosis Date  . MGUS (monoclonal gammopathy of unknown significance)   . 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 Prostate cancer 2006    s/p brachytherapy  . Hypertension   . Migraine   . Chronic kidney disease   . Hyperlipidemia   . Borderline diabetes   . Seborrheic keratosis     History reviewed. No pertinent past surgical history.  Current Outpatient Prescriptions  Medication Sig Dispense Refill  . BYSTOLIC 20 MG TABS Take 20 mg by mouth. daily      . lisinopril-hydrochlorothiazide (PRINZIDE,ZESTORETIC) 20-12.5 MG per tablet Take 1 tablet  by mouth daily.      . simvastatin (ZOCOR) 20 MG tablet Take 20 mg by mouth at bedtime.          ALLERGIES:  is allergic to sulfa antibiotics.  REVIEW OF SYSTEMS:  The rest of the 14-point review of system was negative.   Filed Vitals:   04/27/12 0953  BP: 179/92  Pulse: 55  Temp: 96.8 F (36 C)  Resp: 18   Wt Readings from Last 3 Encounters:  04/27/12 190 lb 12.8 oz (86.546 kg)  10/24/11 191 lb 4.8 oz (86.773 kg)  04/29/11 191 lb 6.4 oz (86.818 kg)   ECOG Performance status: 1  PHYSICAL EXAMINATION:   General:  well-nourished man, in no acute distress.  Eyes:  no scleral icterus.  ENT:  There were no oropharyngeal lesions.  Neck was without thyromegaly.  Lymphatics:  Negative cervical, supraclavicular or axillary adenopathy.  Respiratory: lungs were clear bilaterally without wheezing or crackles.  Cardiovascular:  Regular rate and rhythm, S1/S2, without murmur, rub or gallop.  There was no pedal edema.  GI:  abdomen was soft, flat, nontender, nondistended, without organomegaly.  Muscoloskeletal:  no spinal tenderness of palpation of vertebral spine.  Skin exam was without echymosis, petichae.  Neuro exam was nonfocal.  Patient was able to get on and off exam table without assistance.  Gait was normal.  Patient was alerted and oriented.  Attention was good.   Language was appropriate.  Mood was normal without depression.  Speech was not pressured.  Thought content was not tangential.     LABORATORY/RADIOLOGY DATA:  Lab Results  Component Value Date   WBC 6.5 04/20/2012   HGB 15.3 04/20/2012   HCT 44.1 04/20/2012   PLT 147 04/20/2012   GLUCOSE 141*  10/17/2011   ALKPHOS 66 10/17/2011   ALT 19 10/17/2011   AST 17 10/17/2011   NA 140 10/17/2011   K 4.3 10/17/2011   CL 102 10/17/2011   CREATININE 1.54* 10/17/2011   BUN 24* 10/17/2011   CO2 30 10/17/2011   Creatinine 1.7, Calcium 9.5  ASSESSMENT AND PLAN:    1. Monoclonal gammopathy of undetermined significance (MGUS):  No evidence of  progression to active myeloma.  There is no anemia, hypercalcemia, bone lesions.  Chronic kidney disease is not due to myeloma in this case.  His M-spike level is stable.  I again recommend observation. In the future, if there is sign of progression from MGUS to active myeloma, we may consider bone marrow biopsy.  2. Renal insufficiency: stable Cr, most likely secondary to his hypertension history. He is also s/p right nephrectomy from history of oncocytoma in 1993.  3. Hypertension. He is on Lisinopril/HCTZ per PCP. 4. Hyperlipidemia. He is on simvastatin per PCP.  5. Borderline diabetes: Diet control  6. Seborrheic keratosis. F/u with Dermatology.  7. Follow up in about 6 months.   The length of time of the face-to-face encounter was 15 minutes. More than 50% of time was spent counseling and coordination of care.

## 2012-10-26 ENCOUNTER — Other Ambulatory Visit (HOSPITAL_BASED_OUTPATIENT_CLINIC_OR_DEPARTMENT_OTHER): Payer: Medicare Other

## 2012-10-26 DIAGNOSIS — D472 Monoclonal gammopathy: Secondary | ICD-10-CM

## 2012-10-26 LAB — CBC WITH DIFFERENTIAL/PLATELET
Basophils Absolute: 0.1 10*3/uL (ref 0.0–0.1)
EOS%: 3.1 % (ref 0.0–7.0)
HCT: 43.2 % (ref 38.4–49.9)
HGB: 14.7 g/dL (ref 13.0–17.1)
LYMPH%: 27.4 % (ref 14.0–49.0)
MCH: 30.5 pg (ref 27.2–33.4)
MCV: 89.7 fL (ref 79.3–98.0)
MONO%: 9.2 % (ref 0.0–14.0)
NEUT%: 59.1 % (ref 39.0–75.0)
Platelets: 167 10*3/uL (ref 140–400)

## 2012-10-26 LAB — COMPREHENSIVE METABOLIC PANEL (CC13)
AST: 13 U/L (ref 5–34)
BUN: 27.8 mg/dL — ABNORMAL HIGH (ref 7.0–26.0)
Calcium: 9.2 mg/dL (ref 8.4–10.4)
Chloride: 104 mEq/L (ref 98–107)
Creatinine: 2 mg/dL — ABNORMAL HIGH (ref 0.7–1.3)
Total Bilirubin: 0.55 mg/dL (ref 0.20–1.20)

## 2012-10-28 LAB — PROTEIN ELECTROPHORESIS, SERUM
Alpha-1-Globulin: 7.2 % — ABNORMAL HIGH (ref 2.9–4.9)
Alpha-2-Globulin: 8.8 % (ref 7.1–11.8)
Beta Globulin: 7.1 % (ref 4.7–7.2)
Gamma Globulin: 9 % — ABNORMAL LOW (ref 11.1–18.8)

## 2012-10-28 LAB — KAPPA/LAMBDA LIGHT CHAINS
Kappa free light chain: 0.98 mg/dL (ref 0.33–1.94)
Lambda Free Lght Chn: 1.79 mg/dL (ref 0.57–2.63)

## 2012-11-02 ENCOUNTER — Ambulatory Visit (HOSPITAL_BASED_OUTPATIENT_CLINIC_OR_DEPARTMENT_OTHER): Payer: Medicare Other | Admitting: Oncology

## 2012-11-02 ENCOUNTER — Telehealth: Payer: Self-pay | Admitting: Oncology

## 2012-11-02 VITALS — BP 180/83 | HR 48 | Temp 97.7°F | Resp 18 | Ht 72.0 in | Wt 190.9 lb

## 2012-11-02 DIAGNOSIS — N183 Chronic kidney disease, stage 3 unspecified: Secondary | ICD-10-CM

## 2012-11-02 DIAGNOSIS — D472 Monoclonal gammopathy: Secondary | ICD-10-CM

## 2012-11-02 NOTE — Telephone Encounter (Signed)
Gave pt appt for lab and MD on October , labs every month until October 2014

## 2012-11-02 NOTE — Progress Notes (Signed)
[place]  Telephone:(336) [phone] Fax:(336) [phone]   OFFICE PROGRESS NOTE   Cc:  ARONSON,RICHARD A, MD  DIAGNOSIS: Monoclonal gammopathy of undetermined significance (MGUS).   CURRENT THERAPY: watchful observation.    INTERVAL HISTORY: [name] 72 y.o. male returns for regular follow up by himself. He has mild fatigue when he watches his grandchildren.  He is still independent of activities of daily living.   Patient denies fever, anorexia, weight loss, headache, visual changes, confusion, drenching night sweats, palpable lymph node swelling, mucositis, odynophagia, dysphagia, nausea vomiting, jaundice, chest pain, palpitation, shortness of breath, dyspnea on exertion, productive cough, gum bleeding, epistaxis, hematemesis, hemoptysis, abdominal pain, abdominal swelling, early satiety, melena, hematochezia, hematuria, skin rash, spontaneous bleeding, joint swelling, joint pain, heat or cold intolerance, bowel bladder incontinence, back pain, focal motor weakness, paresthesia, depression.    Past Medical History  Diagnosis Date  . MGUS (monoclonal gammopathy of unknown significance)   . 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 Prostate cancer 2006    s/p brachytherapy  . Hypertension   . Migraine   . Chronic kidney disease   . Hyperlipidemia   . Borderline diabetes   . Seborrheic keratosis     No past surgical history on file.  Current Outpatient Prescriptions  Medication Sig Dispense Refill  . BYSTOLIC 20 MG TABS Take 20 mg by mouth. daily      . lisinopril-hydrochlorothiazide (PRINZIDE,ZESTORETIC) 20-12.5 MG per tablet Take 1 tablet by mouth daily.      . simvastatin (ZOCOR) 20 MG tablet Take 20 mg by mouth at bedtime.         No current facility-administered medications for this visit.    ALLERGIES:  is allergic to sulfa antibiotics.  REVIEW OF SYSTEMS:  The rest of the 14-point review of system was negative.    Filed Vitals:   11/02/12 1015  BP: 180/83  Pulse: 48  Temp: 97.7 F (36.5 C)  Resp: 18   Wt Readings from Last 3 Encounters:  11/02/12 190 lb 14.4 oz (86.592 kg)  04/27/12 190 lb 12.8 oz (86.546 kg)  10/24/11 191 lb 4.8 oz (86.773 kg)   ECOG Performance status: 1  PHYSICAL EXAMINATION:   General:  well-nourished man, in no acute distress.  Eyes:  no scleral icterus.  ENT:  There were no oropharyngeal lesions.  Neck was without thyromegaly.  Lymphatics:  Negative cervical, supraclavicular or axillary adenopathy.  Respiratory: lungs were clear bilaterally without wheezing or crackles.  Cardiovascular:  Regular rate and rhythm, S1/S2, without murmur, rub or gallop.  There was no pedal edema.  GI:  abdomen was soft, flat, nontender, nondistended, without organomegaly.  Muscoloskeletal:  no spinal tenderness of palpation of vertebral spine.  Skin exam was without echymosis, petichae.  Neuro exam was nonfocal.  Patient was able to get on and off exam table without assistance.  Gait was normal.  Patient was alerted and oriented.  Attention was good.   Language was appropriate.  Mood was normal without depression.  Speech was not pressured.  Thought content was not tangential.     LABORATORY/RADIOLOGY DATA:  Lab Results  Component Value Date   WBC 6.3 10/26/2012   HGB 14.7 10/26/2012   HCT 43.2 10/26/2012   PLT 167 10/26/2012   GLUCOSE 304* 10/26/2012   ALKPHOS 89 10/26/2012   ALT 14 10/26/2012   AST 13 10/26/2012   NA 140 10/26/2012   K 4.4 10/26/2012  CL 104 10/26/2012   CREATININE 2.0* 10/26/2012   BUN 27.8* 10/26/2012   CO2 28 10/26/2012    ASSESSMENT AND PLAN:    1. Monoclonal gammopathy of undetermined significance (MGUS):  No evidence of progression to active myeloma with the exception of worsening renal function which can be due to HTN, DM.  There is no anemia, hypercalcemia, lytic bone lesions.  His M-spike and serum free light chains have been stable.  I discussed with him two  options.  The aggressive option is to proceed with bone marrow biopsy now to rule out progression of MGUS to myeloma due to worsening renal function.  The conservative option is to wait until follow up BMET.  If his renal function worsens in the future, we can proceed with bone marrow biopsy then.  He preferred to wait.  2. Renal insufficiency: stable Cr, most likely secondary to his hypertension and diabetes. He is also s/p right nephrectomy from history of oncocytoma in 1993.  To monitor his renal function, he has lab only appointment for BMET monthly the next 3 months.  3. Hypertension. He is on Lisinopril/HCTZ and metoprolol per PCP. 4. Hyperlipidemia. He is on simvastatin per PCP.  5. Diabetes mellitus:  Per PCP.  6. Seborrheic keratosis. F/u with Dermatology.  7. Follow up:  Lab only appointment monthly for BMET.  Return visit in about 4 months.    I informed [name] that I am leaving the practice.  [facility] will arrange for him to see another provider when he returns.    The length of time of the face-to-face encounter was 15 minutes. More than 50% of time was spent counseling and coordination of care.      Tushar Enns T. Lamonte Sakai, M.D.

## 2012-11-09 ENCOUNTER — Telehealth: Payer: Self-pay | Admitting: *Deleted

## 2012-11-09 NOTE — Telephone Encounter (Signed)
Pt left VM states wants to s/w Dr. Lamonte Sakai about having a Bone Marrow Biopsy.  He would like to talk more about it and asks if Dr Lamonte Sakai can return his call after 5:30 pm today when he gets home from work?

## 2012-11-10 NOTE — Telephone Encounter (Signed)
Informed wife of Bone Marrow Biopsy time change.  Arrive to [facility] at 10:30 am for lab and Bx is scheduled w/ Dr. Lamonte Sakai at 11 am on 7/11.  She verbalized understanding and will inform pt..  Notified Butch Penny in Flow Cytometry of date/time of Biopsy.

## 2012-11-20 ENCOUNTER — Other Ambulatory Visit (HOSPITAL_COMMUNITY)
Admission: RE | Admit: 2012-11-20 | Discharge: 2012-11-20 | Disposition: A | Discharge status: Home / Self Care | Payer: Medicare Other | Source: Ambulatory Visit | Attending: Oncology | Admitting: Oncology

## 2012-11-20 ENCOUNTER — Ambulatory Visit (HOSPITAL_BASED_OUTPATIENT_CLINIC_OR_DEPARTMENT_OTHER): Payer: Medicare Other | Admitting: Oncology

## 2012-11-20 ENCOUNTER — Other Ambulatory Visit (HOSPITAL_BASED_OUTPATIENT_CLINIC_OR_DEPARTMENT_OTHER): Payer: Medicare Other

## 2012-11-20 VITALS — BP 176/69 | HR 46 | Temp 97.2°F | Resp 18

## 2012-11-20 DIAGNOSIS — D472 Monoclonal gammopathy: Secondary | ICD-10-CM

## 2012-11-20 DIAGNOSIS — N183 Chronic kidney disease, stage 3 (moderate): Secondary | ICD-10-CM

## 2012-11-20 LAB — CBC
MCH: 29.7 pg (ref 26.0–34.0)
MCHC: 33.9 g/dL (ref 30.0–36.0)
Platelets: 190 10*3/uL (ref 150–400)
RBC: 4.85 MIL/uL (ref 4.22–5.81)
RDW: 12.8 % (ref 11.5–15.5)

## 2012-11-20 LAB — BASIC METABOLIC PANEL (CC13)
CO2: 25 mEq/L (ref 22–29)
Calcium: 9.3 mg/dL (ref 8.4–10.4)
Creatinine: 1.6 mg/dL — ABNORMAL HIGH (ref 0.7–1.3)
Glucose: 143 mg/dl — ABNORMAL HIGH (ref 70–140)

## 2012-11-20 NOTE — Progress Notes (Signed)
Dressing clean, dry, intact.  No complaints.  SLJ

## 2012-11-20 NOTE — Patient Instructions (Addendum)
[place] Discharge Instructions for Post Bone Marrow Procedure  Today you had a bone marrow biopsy and aspirate of the left hip.  Please keep the pressure dressing in place for at least 24 hours.  Have someone check your dressing periodically for bleeding.  If needed you can reapply a pressure dressing to the site.  Take pain medication such as Tylenol as directed.  IF BLEEDING REOCCURS THAT SHOULD BE REPORTED IMMEDIATELY. Call the [facility] at [phone] if during business hours. Or report to the Emergency Room.   I have been informed and understand all the instructions given to me. I know to contact the clinic, my physician, or go to the Emergency Department if any problems should occur. I do not have any questions at this time, but understand that I may call the clinic during office hours at [phone]  should I have any questions or need assistance in obtaining follow up care.    __________________________________________  _____________  __________ Signature of Patient or Authorized Representative            Date                   Time    __________________________________________ Nurse's Signature

## 2012-11-21 NOTE — Progress Notes (Signed)
Please see bone marrow biopsy procedure dated 11/20/12.

## 2012-11-21 NOTE — Procedures (Signed)
   [place]  Telephone:([phone] Fax:(336) [phone]   BONE MARROW BIOPSY AND ASPIRATION   INDICATION:  Worsening of renal insufficiency in a patient with MGUS; ruling out progression from MGUS to myeloma.   Procedure: After obtained from consent, [name] was placed in the prone position. Time out was performed verifying correct patient and procedure. The skin overlying the left posterior crest was prepped with Betadine and draped in the usual sterile fashion. The skin and periosteum were infiltrated with 20 mL of 2% lidocaine. A small puncture wound was made with #11 scalpel blade.  Bone marrow aspirate was obtained on the first pass of the aspiration needle.  Two separate core biopsies were obtained through the same incision as the first one was sub centimeter.   The aspirate was sent for routine histology, flow cytometry, and cytogenetics.  Core biopsy was sent for routine histology.   [name] tolerated procedure well with minimal  blood loss and without immediate complication.   A sterile dressing was applied.   Sherryl Manges M.D. 11/21/2012

## 2012-11-23 ENCOUNTER — Other Ambulatory Visit: Payer: Medicare Other

## 2012-11-25 ENCOUNTER — Telehealth: Payer: Self-pay | Admitting: *Deleted

## 2012-11-25 ENCOUNTER — Encounter: Payer: Self-pay | Admitting: Oncology

## 2012-11-25 NOTE — Telephone Encounter (Signed)
Pt left VM states he is returning a call from Dr. Lamonte Sakai.

## 2012-11-26 ENCOUNTER — Telehealth: Payer: Self-pay | Admitting: *Deleted

## 2012-11-26 NOTE — Telephone Encounter (Signed)
Pt left VM states returning Dr. Agustina Caroli call, unfortunately he had to go to work this morning and unable to answer his cell phone while on job site.  S/w wife who states pt will take lunch from 11:30 am to 12 noon and can be reached on his cell phone [phone] during that time.   Otherwise he will be back at home today around 5:30 pm.

## 2012-11-30 ENCOUNTER — Encounter: Payer: Self-pay | Admitting: *Deleted

## 2012-11-30 LAB — CHROMOSOME ANALYSIS, BONE MARROW

## 2012-11-30 NOTE — Progress Notes (Signed)
Cytogenetic results rec'd from [place] to be scanned into Epic.  Cytogenetic Analysis states "Normal: Cytogenetic analysis revealed the presence of normal male chromosomes with no observable clonal chromosomal abnormalities."

## 2012-12-31 ENCOUNTER — Telehealth: Payer: Self-pay | Admitting: Hematology and Oncology

## 2012-12-31 NOTE — Telephone Encounter (Signed)
pt called and cancelled all appts he does not want to come back

## 2013-01-04 ENCOUNTER — Other Ambulatory Visit: Payer: Self-pay | Admitting: *Deleted

## 2013-01-04 ENCOUNTER — Other Ambulatory Visit: Payer: Medicare Other

## 2013-01-04 DIAGNOSIS — N189 Chronic kidney disease, unspecified: Secondary | ICD-10-CM

## 2013-01-04 DIAGNOSIS — D472 Monoclonal gammopathy: Secondary | ICD-10-CM

## 2013-01-06 ENCOUNTER — Other Ambulatory Visit: Payer: Medicare Other

## 2013-02-01 ENCOUNTER — Telehealth: Payer: Self-pay | Admitting: Internal Medicine

## 2013-02-01 NOTE — Telephone Encounter (Signed)
pt cancelled all appts , he has found another MD

## 2013-02-03 ENCOUNTER — Other Ambulatory Visit: Payer: Medicare Other

## 2013-03-01 ENCOUNTER — Other Ambulatory Visit: Payer: Medicare Other

## 2013-03-03 ENCOUNTER — Other Ambulatory Visit: Payer: Medicare Other

## 2013-03-08 ENCOUNTER — Ambulatory Visit: Payer: Medicare Other

## 2016-04-12 HISTORY — PX: REPAIR KNEE LIGAMENT: SUR1188

## 2016-09-25 ENCOUNTER — Inpatient Hospital Stay (HOSPITAL_COMMUNITY)
Admission: EM | Admit: 2016-09-25 | Discharge: 2016-09-28 | DRG: 246 | Disposition: A | Discharge status: Home / Self Care | Payer: Medicare Other | Attending: Cardiology | Admitting: Cardiology

## 2016-09-25 ENCOUNTER — Emergency Department (HOSPITAL_COMMUNITY): Payer: Medicare Other

## 2016-09-25 ENCOUNTER — Encounter (HOSPITAL_COMMUNITY): Payer: Self-pay

## 2016-09-25 ENCOUNTER — Other Ambulatory Visit: Payer: Self-pay

## 2016-09-25 DIAGNOSIS — R0789 Other chest pain: Secondary | ICD-10-CM | POA: Diagnosis present

## 2016-09-25 DIAGNOSIS — I214 Non-ST elevation (NSTEMI) myocardial infarction: Principal | ICD-10-CM | POA: Diagnosis present

## 2016-09-25 DIAGNOSIS — Z955 Presence of coronary angioplasty implant and graft: Secondary | ICD-10-CM

## 2016-09-25 DIAGNOSIS — Z7984 Long term (current) use of oral hypoglycemic drugs: Secondary | ICD-10-CM

## 2016-09-25 DIAGNOSIS — I25119 Atherosclerotic heart disease of native coronary artery with unspecified angina pectoris: Secondary | ICD-10-CM | POA: Diagnosis present

## 2016-09-25 DIAGNOSIS — E785 Hyperlipidemia, unspecified: Secondary | ICD-10-CM | POA: Diagnosis present

## 2016-09-25 DIAGNOSIS — N183 Chronic kidney disease, stage 3 (moderate): Secondary | ICD-10-CM | POA: Diagnosis present

## 2016-09-25 DIAGNOSIS — E1122 Type 2 diabetes mellitus with diabetic chronic kidney disease: Secondary | ICD-10-CM | POA: Diagnosis present

## 2016-09-25 DIAGNOSIS — I97630 Postprocedural hematoma of a circulatory system organ or structure following a cardiac catheterization: Secondary | ICD-10-CM | POA: Diagnosis not present

## 2016-09-25 DIAGNOSIS — Z882 Allergy status to sulfonamides status: Secondary | ICD-10-CM | POA: Diagnosis not present

## 2016-09-25 DIAGNOSIS — D472 Monoclonal gammopathy: Secondary | ICD-10-CM | POA: Diagnosis present

## 2016-09-25 DIAGNOSIS — I129 Hypertensive chronic kidney disease with stage 1 through stage 4 chronic kidney disease, or unspecified chronic kidney disease: Secondary | ICD-10-CM | POA: Diagnosis present

## 2016-09-25 DIAGNOSIS — Z905 Acquired absence of kidney: Secondary | ICD-10-CM

## 2016-09-25 DIAGNOSIS — Z8546 Personal history of malignant neoplasm of prostate: Secondary | ICD-10-CM

## 2016-09-25 DIAGNOSIS — R2231 Localized swelling, mass and lump, right upper limb: Secondary | ICD-10-CM | POA: Diagnosis not present

## 2016-09-25 HISTORY — DX: Non-ST elevation (NSTEMI) myocardial infarction: I21.4

## 2016-09-25 LAB — GLUCOSE, CAPILLARY: GLUCOSE-CAPILLARY: 165 mg/dL — AB (ref 65–99)

## 2016-09-25 LAB — LIPID PANEL
CHOL/HDL RATIO: 4.9 ratio
CHOLESTEROL: 199 mg/dL (ref 0–200)
HDL: 41 mg/dL (ref >40)
LDL CALC: 111 mg/dL — AB (ref 0–99)
Triglycerides: 233 mg/dL — ABNORMAL HIGH (ref <150)
VLDL: 47 mg/dL — AB (ref 0–40)

## 2016-09-25 LAB — I-STAT TROPONIN, ED: Troponin i, poc: 7.93 ng/mL (ref 0.00–0.08)

## 2016-09-25 LAB — D-DIMER, QUANTITATIVE: D-Dimer, Quant: 0.58 ug/mL-FEU — ABNORMAL HIGH (ref 0.00–0.50)

## 2016-09-25 LAB — CBC
HEMATOCRIT: 45.4 % (ref 39.0–52.0)
Hemoglobin: 15.6 g/dL (ref 13.0–17.0)
MCH: 30.4 pg (ref 26.0–34.0)
MCHC: 34.4 g/dL (ref 30.0–36.0)
MCV: 88.5 fL (ref 78.0–100.0)
Platelets: 179 10*3/uL (ref 150–400)
RBC: 5.13 MIL/uL (ref 4.22–5.81)
RDW: 12.8 % (ref 11.5–15.5)
WBC: 11 10*3/uL — AB (ref 4.0–10.5)

## 2016-09-25 LAB — BASIC METABOLIC PANEL
Anion gap: 10 (ref 5–15)
BUN: 27 mg/dL — AB (ref 6–20)
CHLORIDE: 100 mmol/L — AB (ref 101–111)
CO2: 28 mmol/L (ref 22–32)
CREATININE: 1.98 mg/dL — AB (ref 0.61–1.24)
Calcium: 9.5 mg/dL (ref 8.9–10.3)
GFR calc Af Amer: 36 mL/min — ABNORMAL LOW (ref >60)
GFR calc non Af Amer: 31 mL/min — ABNORMAL LOW (ref >60)
GLUCOSE: 256 mg/dL — AB (ref 65–99)
POTASSIUM: 4.5 mmol/L (ref 3.5–5.1)
Sodium: 138 mmol/L (ref 135–145)

## 2016-09-25 LAB — PROTIME-INR
INR: 0.96
PROTHROMBIN TIME: 12.8 s (ref 11.4–15.2)

## 2016-09-25 LAB — HEPARIN LEVEL (UNFRACTIONATED): Heparin Unfractionated: 0.22 IU/mL — ABNORMAL LOW (ref 0.30–0.70)

## 2016-09-25 LAB — TROPONIN I
Troponin I: 11.44 ng/mL (ref <0.03)
Troponin I: 11.1 ng/mL (ref <0.03)

## 2016-09-25 LAB — MRSA PCR SCREENING: MRSA by PCR: NEGATIVE

## 2016-09-25 LAB — APTT: aPTT: 27 seconds (ref 24–36)

## 2016-09-25 LAB — TSH: TSH: 0.733 u[IU]/mL (ref 0.350–4.500)

## 2016-09-25 IMAGING — CR DG CHEST 2V
2 series · 2 of 2 positions shown · non-contrast
Comparison: None in PACs

CLINICAL DATA: Mid chest pain radiating to the back increasing
since [REDACTED]. Patient reports shortness of breath with increased
pain episodes.

EXAM:
CHEST  2 VIEW

[w chest pa]
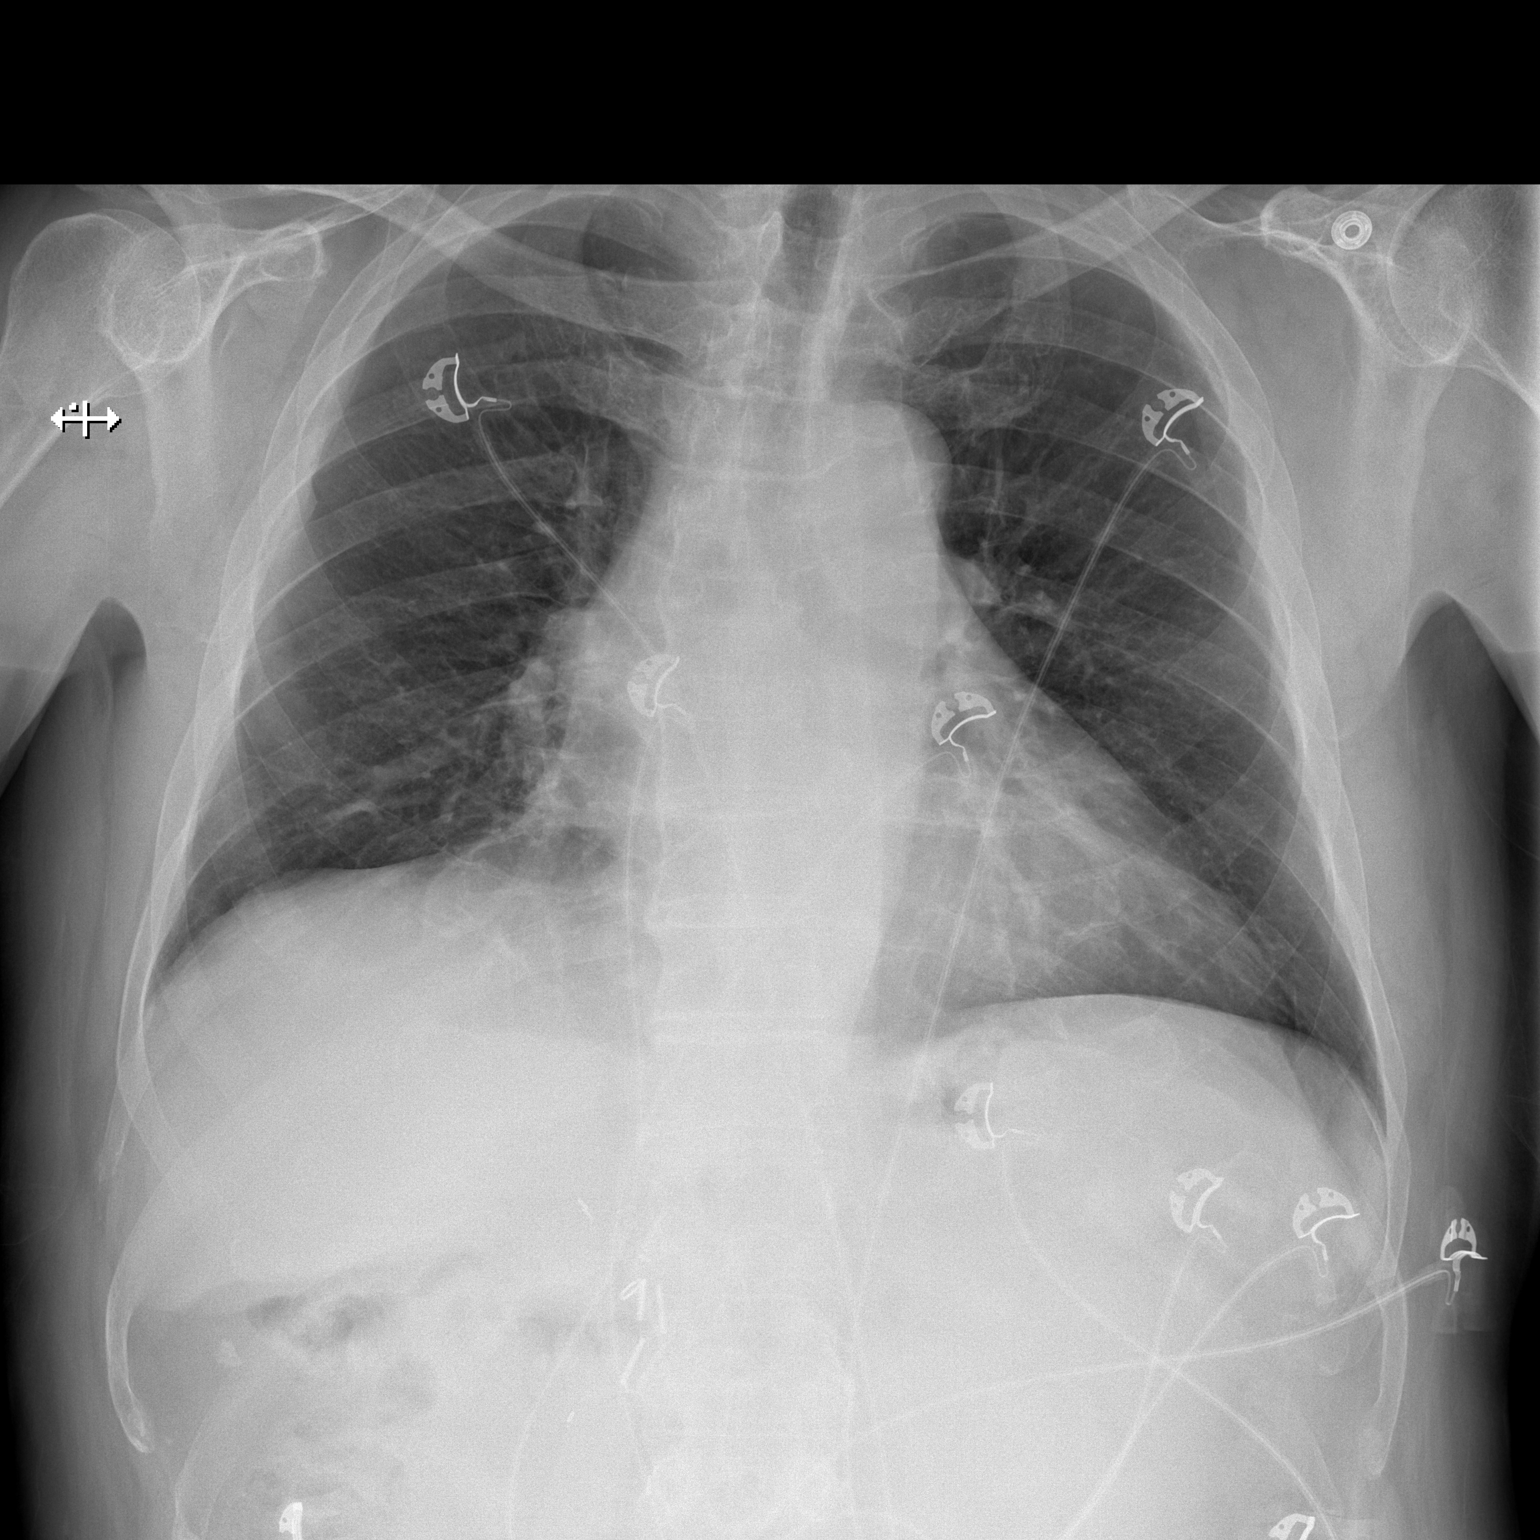

[w chest lat]
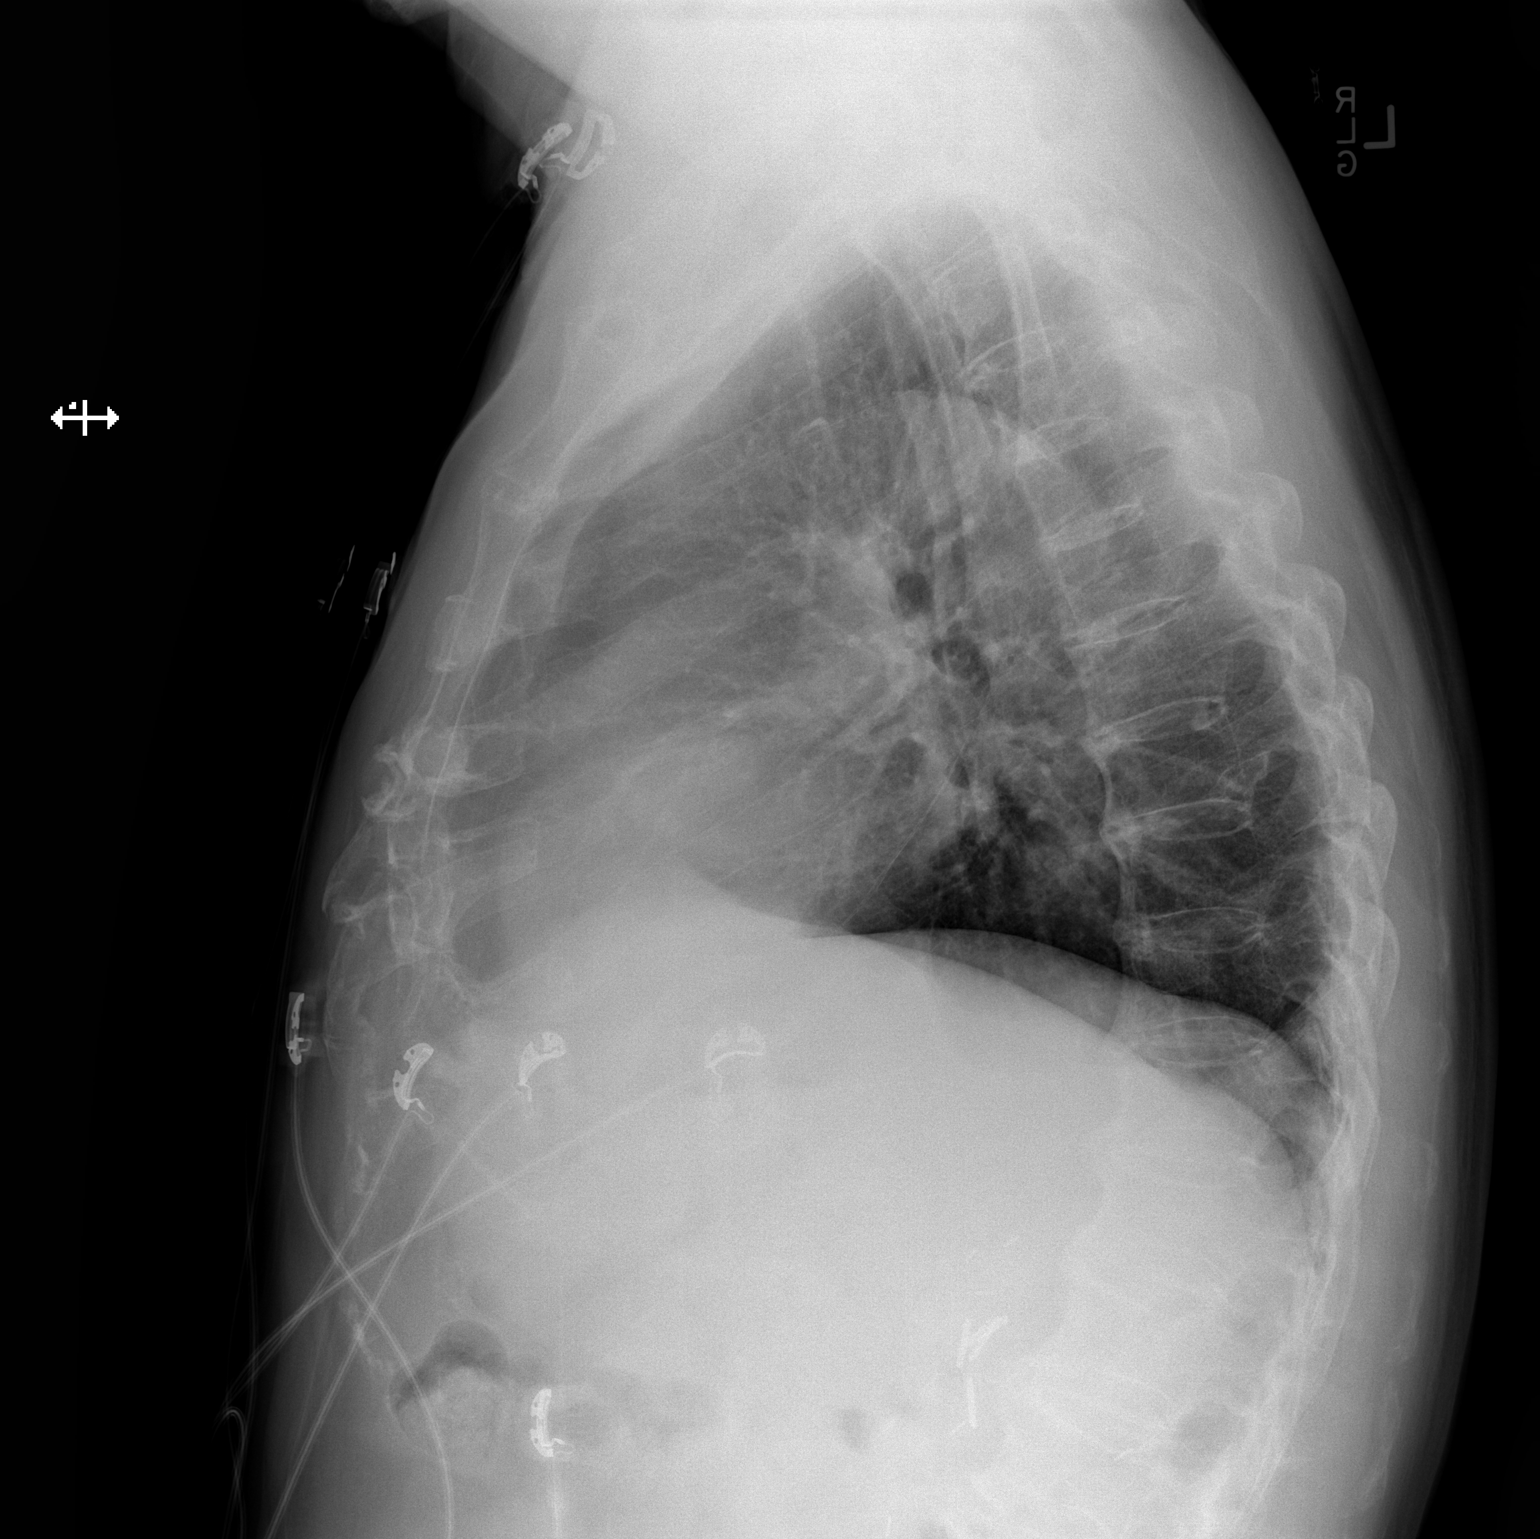

[2 of 2 positions shown; findings below may reference images not displayed]

FINDINGS: The lungs are reasonably well inflated. There is no focal
infiltrate. There is no pleural effusion. The heart is normal in
size. The pulmonary vascularity is normal. There is calcification in
the wall of the aortic arch. The mediastinum is normal in width.
There is mild multilevel degenerative disc disease of the thoracic
spine.
IMPRESSION: Top-normal cardiac size.  No pulmonary edema or pneumonia.

Thoracic aortic atherosclerosis.

## 2016-09-25 MED ORDER — HEPARIN (PORCINE) IN NACL 100-0.45 UNIT/ML-% IJ SOLN
1300.0000 [IU]/h | INTRAMUSCULAR | Status: DC
  Administered 2016-09-25: 1100 [IU]/h via INTRAVENOUS
  Filled 2016-09-25: qty 250

## 2016-09-25 MED ORDER — SODIUM CHLORIDE 0.9 % IV SOLN
INTRAVENOUS | Status: DC
  Administered 2016-09-25: 13:00:00 via INTRAVENOUS

## 2016-09-25 MED ORDER — ASPIRIN EC 81 MG PO TBEC
81.0000 mg | DELAYED_RELEASE_TABLET | Freq: Every day | ORAL | Status: DC
  Administered 2016-09-26 – 2016-09-28 (×3): 81 mg via ORAL
  Filled 2016-09-25 (×3): qty 1

## 2016-09-25 MED ORDER — ISOSORBIDE MONONITRATE ER 60 MG PO TB24
60.0000 mg | ORAL_TABLET | Freq: Every day | ORAL | Status: DC
  Administered 2016-09-25 – 2016-09-28 (×3): 60 mg via ORAL
  Filled 2016-09-25 (×3): qty 1

## 2016-09-25 MED ORDER — ASPIRIN 81 MG PO CHEW
324.0000 mg | CHEWABLE_TABLET | ORAL | Status: AC
  Administered 2016-09-25: 324 mg via ORAL
  Filled 2016-09-25: qty 4

## 2016-09-25 MED ORDER — SODIUM CHLORIDE 0.9 % IV SOLN
INTRAVENOUS | Status: DC
  Administered 2016-09-25 – 2016-09-26 (×2): via INTRAVENOUS

## 2016-09-25 MED ORDER — HEPARIN BOLUS VIA INFUSION
4000.0000 [IU] | Freq: Once | INTRAVENOUS | Status: AC
  Administered 2016-09-25: 4000 [IU] via INTRAVENOUS
  Filled 2016-09-25: qty 4000

## 2016-09-25 MED ORDER — HYDRALAZINE HCL 20 MG/ML IJ SOLN: 10.0000 mg | INTRAMUSCULAR | Status: DC | PRN

## 2016-09-25 MED ORDER — NEBIVOLOL HCL 10 MG PO TABS
20.0000 mg | ORAL_TABLET | Freq: Every day | ORAL | Status: DC
  Administered 2016-09-27 – 2016-09-28 (×2): 20 mg via ORAL
  Filled 2016-09-25 (×3): qty 2

## 2016-09-25 MED ORDER — SODIUM CHLORIDE 0.9 % IV SOLN: 250.0000 mL | INTRAVENOUS | Status: DC | PRN

## 2016-09-25 MED ORDER — INSULIN ASPART 100 UNIT/ML ~~LOC~~ SOLN
0.0000 [IU] | Freq: Three times a day (TID) | SUBCUTANEOUS | Status: DC
  Administered 2016-09-25 – 2016-09-26 (×2): 3 [IU] via SUBCUTANEOUS
  Administered 2016-09-27 (×2): 2 [IU] via SUBCUTANEOUS
  Administered 2016-09-27: 5 [IU] via SUBCUTANEOUS

## 2016-09-25 MED ORDER — SODIUM CHLORIDE 0.9% FLUSH: 3.0000 mL | INTRAVENOUS | Status: DC | PRN

## 2016-09-25 MED ORDER — ASPIRIN 300 MG RE SUPP: 300.0000 mg | RECTAL | Status: AC

## 2016-09-25 MED ORDER — SODIUM CHLORIDE 0.9 % IV SOLN: INTRAVENOUS | Status: DC

## 2016-09-25 MED ORDER — HEPARIN BOLUS VIA INFUSION
1500.0000 [IU] | Freq: Once | INTRAVENOUS | Status: AC
  Administered 2016-09-25: 1500 [IU] via INTRAVENOUS
  Filled 2016-09-25: qty 1500

## 2016-09-25 MED ORDER — SODIUM CHLORIDE 0.9% FLUSH
3.0000 mL | Freq: Two times a day (BID) | INTRAVENOUS | Status: DC
  Administered 2016-09-25: 3 mL via INTRAVENOUS

## 2016-09-25 MED ORDER — NITROGLYCERIN 0.4 MG SL SUBL: 0.4000 mg | SUBLINGUAL_TABLET | SUBLINGUAL | Status: DC | PRN

## 2016-09-25 MED ORDER — ATORVASTATIN CALCIUM 80 MG PO TABS
80.0000 mg | ORAL_TABLET | Freq: Every day | ORAL | Status: DC
  Administered 2016-09-25 – 2016-09-27 (×3): 80 mg via ORAL
  Filled 2016-09-25 (×3): qty 1

## 2016-09-25 MED ORDER — ONDANSETRON HCL 4 MG/2ML IJ SOLN: 4.0000 mg | Freq: Four times a day (QID) | INTRAMUSCULAR | Status: DC | PRN

## 2016-09-25 MED ORDER — METOPROLOL TARTRATE 25 MG PO TABS
25.0000 mg | ORAL_TABLET | Freq: Two times a day (BID) | ORAL | Status: DC
  Administered 2016-09-25: 25 mg via ORAL
  Filled 2016-09-25: qty 1

## 2016-09-25 MED ORDER — INSULIN ASPART 100 UNIT/ML ~~LOC~~ SOLN
3.0000 [IU] | Freq: Three times a day (TID) | SUBCUTANEOUS | Status: DC
  Administered 2016-09-25 – 2016-09-27 (×3): 3 [IU] via SUBCUTANEOUS

## 2016-09-25 MED ORDER — ENSURE ENLIVE PO LIQD
237.0000 mL | Freq: Two times a day (BID) | ORAL | Status: DC
  Administered 2016-09-28: 237 mL via ORAL

## 2016-09-25 NOTE — Progress Notes (Signed)
CRITICAL VALUE ALERT  Critical value received: Troponin 11  Date of notification:  09/25/16  Time of notification:  2206  Critical value read back:yes  Nurse who received alert:  Clyda Hurdle RN  MD notified (1st page):  Koleen Nimrod MD  Time of first page:  2244  MD notified (2nd page):  Time of second page:  Responding MD:  Koleen Nimrod MD  Time MD responded:  [phone]

## 2016-09-25 NOTE — Progress Notes (Signed)
[place] for IV heparin Indication: chest pain/ACS  Allergies  Allergen Reactions  . Sulfa Antibiotics Hives    Patient Measurements: Height: 6' (182.9 cm) Weight: 188 lb (85.3 kg) IBW/kg (Calculated) : 77.6 Heparin Dosing Weight: 85 kg  Vital Signs: Temp: 98.2 F (36.8 C) (05/16 1056) Temp Source: Oral (05/16 1056) BP: 129/72 (05/16 1200) Pulse Rate: 64 (05/16 1056)  Labs:  Recent Labs  09/25/16 1125  HGB 15.6  HCT 45.4  PLT 179  CREATININE 1.98*    Estimated Creatinine Clearance: 34.3 mL/min (A) (by C-G formula based on SCr of 1.98 mg/dL (H)).   Medical History: Past Medical History:  Diagnosis Date  . Borderline diabetes   . Chronic kidney disease   . Hyperlipidemia   . Hypertension   . MGUS (monoclonal gammopathy of unknown significance)   . Migraine   . 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 Prostate cancer 2006   s/p brachytherapy  . Seborrheic keratosis     Medications:  Prescriptions Prior to Admission  Medication Sig Dispense Refill Last Dose  . aspirin EC 81 MG tablet Take 81 mg by mouth daily.   09/25/2016 at 0800  . BYSTOLIC 20 MG TABS Take 20 mg by mouth daily. daily   09/25/2016 at Unknown time   Scheduled:  . [START ON 09/26/2016] aspirin EC  81 mg Oral Daily  . atorvastatin  80 mg Oral q1800  . metoprolol tartrate  25 mg Oral BID   Infusions:  . sodium chloride 125 mL/hr at 09/25/16 1311  . sodium chloride    . heparin 1,100 Units/hr (09/25/16 1311)    Assessment: 33 yoM with PMH CKD, HLD, HTN, MGUS, R nephrectomy, prostate Ca; presents with L chest pain x 2d. No prior CAD Hx. Troponins elevated but no ischemic changes on EKG. Pharmacy to dose heparin for ACS with plans to transfer to [facility] for cardiac workup   Baseline INR, aPTT: wnl  Prior anticoagulation: none, ASA 81 mg only  Significant events:  Today, 09/25/2016:  CBC: wnl  No bleeding or infusion issues per  nursing  CrCl: 34 ml/min  Goal of Therapy: Heparin level 0.3-0.7 units/ml Monitor platelets by anticoagulation protocol: Yes  Plan:  Heparin 4000 units IV bolus x 1  Heparin 1100 units/hr IV infusion  Check heparin level 8 hrs after start  Daily CBC, daily heparin level once stable  Monitor for signs of bleeding or thrombosis   Reuel Boom, PharmD Pager: [phone] 09/25/2016, 2:43 PM

## 2016-09-25 NOTE — ED Triage Notes (Signed)
Pt with L chest pain x 2 days, radiating to back between should blades. Pt states he mowed lawn yesterday and pain became severe after activity, pt rested yesterday and pain decreased to 5/10. Pt states he is SOB when pain becomes severe. No distress in triage.

## 2016-09-25 NOTE — H&P (Signed)
[name] is an 76 y.o. male.   Chief Complaint: Chest pain  PCP: Nada Libman, M.D.  HPI: [name]  is a 76 y.o. male  With history of hyperglycemia, stage III chronic kidney disease secondary to hypertension and also single kidney,S/P right nephrectomy secondary to malignancy, history of prostate cancer status post seed implantation, all remote, serum creatinine has been stable around 1.7-2.0, admitted to the hospital with new onset of chest pain that started on Sunday, waxing and waning, with gradual worsening, this morning she could not take even a few steps without having to stop due to marked dyspnea associated with chest tightness. Chest tightness was described as pressure in the center of the chest with radiation to the left shoulder. No other associated symptoms except for dyspnea that he noted this morning.  Patient otherwise fairly active, no prior history of dyspnea or chest pain. No recent weight changes, no history of GI bleed. His wife and children are present at the bedside.   Past Medical History:  Diagnosis Date  . Borderline diabetes   . Chronic kidney disease   . Hyperlipidemia   . Hypertension   . MGUS (monoclonal gammopathy of unknown significance)   . Migraine   . 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 Prostate cancer ([place]) 2006   s/p brachytherapy  . Seborrheic keratosis     Past Surgical History:  Procedure Laterality Date  . CARDIAC CATHETERIZATION    . Left knee surgery     december 2017  . Prostate surgery-seed implants    . Right nephrectomy  1984   [place]  . TONSILLECTOMY      History reviewed. No pertinent family history. Social History:  reports that he has never smoked. He has never used smokeless tobacco. He reports that he does not drink alcohol or use drugs.  Allergies:  Allergies  Allergen Reactions  . Sulfa Antibiotics Hives   Review of Systems - Negative except Recurrent gout, mild arthritis, no  claudication or TIA. No recent weight changes. No bowel or bladder disturbances. Other systems negative.    Blood pressure (!) 168/84, pulse (!) 58, temperature 98.2 F (36.8 C), temperature source Oral, resp. rate (!) 24, height 6' (1.829 m), weight 84.5 kg (186 lb 4.6 oz), SpO2 97 %. Body mass index is 25.27 kg/m.  General appearance: alert, cooperative, appears stated age and no distress Eyes: negative findings: lids and lashes normal and conjunctivae and sclerae normal Neck: no adenopathy, no carotid bruit, no JVD, supple, symmetrical, trachea midline and thyroid not enlarged, symmetric, no tenderness/mass/nodules Neck: JVP - normal, carotids 2+= without bruits Resp: clear to auscultation bilaterally Chest wall: no tenderness Cardio: regular rate and rhythm, S1, S2 normal, no murmur, click, rub or gallop GI: soft, non-tender; bowel sounds normal; no masses,  no organomegaly Extremities: extremities normal, atraumatic, no cyanosis or edema Pulses: 2+ and symmetric Skin: Skin color, texture, turgor normal. No rashes or lesions Neurologic: Grossly normal  Results for orders placed or performed during the hospital encounter of 09/25/16 (from the past 48 hour(s))  APTT     Status: None   Collection Time: 09/25/16 11:18 AM  Result Value Ref Range   aPTT 27 24 - 36 seconds  Protime-INR     Status: None   Collection Time: 09/25/16 11:18 AM  Result Value Ref Range   Prothrombin Time 12.8 11.4 - 15.2 seconds   INR 0.96   Troponin I  Status: Abnormal   Collection Time: 09/25/16 11:18 AM  Result Value Ref Range   Troponin I 11.44 (HH) <0.03 ng/mL    Comment: CRITICAL RESULT CALLED TO, READ BACK BY AND VERIFIED WITH: J.[facility] RN [phone] [id number] [email or web address]   Basic metabolic panel     Status: Abnormal   Collection Time: 09/25/16 11:25 AM  Result Value Ref Range   Sodium 138 135 - 145 mmol/L   Potassium 4.5 3.5 - 5.1 mmol/L   Chloride 100 (L) 101 - 111 mmol/L   CO2 28 22 - 32 mmol/L    Glucose, Bld 256 (H) 65 - 99 mg/dL   BUN 27 (H) 6 - 20 mg/dL   Creatinine, Ser 1.98 (H) 0.61 - 1.24 mg/dL   Calcium 9.5 8.9 - 10.3 mg/dL   GFR calc non Af Amer 31 (L) >60 mL/min   GFR calc Af Amer 36 (L) >60 mL/min    Comment: (NOTE) The eGFR has been calculated using the CKD EPI equation. This calculation has not been validated in all clinical situations. eGFR's persistently <60 mL/min signify possible Chronic Kidney Disease.    Anion gap 10 5 - 15  CBC     Status: Abnormal   Collection Time: 09/25/16 11:25 AM  Result Value Ref Range   WBC 11.0 (H) 4.0 - 10.5 K/uL   RBC 5.13 4.22 - 5.81 MIL/uL   Hemoglobin 15.6 13.0 - 17.0 g/dL   HCT 45.4 39.0 - 52.0 %   MCV 88.5 78.0 - 100.0 fL   MCH 30.4 26.0 - 34.0 pg   MCHC 34.4 30.0 - 36.0 g/dL   RDW 12.8 11.5 - 15.5 %   Platelets 179 150 - 400 K/uL  D-dimer, quantitative (not at [facility])     Status: Abnormal   Collection Time: 09/25/16 11:25 AM  Result Value Ref Range   D-Dimer, Quant 0.58 (H) 0.00 - 0.50 ug/mL-FEU    Comment: (NOTE) At the manufacturer cut-off of 0.50 ug/mL FEU, this assay has been documented to exclude PE with a sensitivity and negative predictive value of 97 to 99%.  At this time, this assay has not been approved by the FDA to exclude DVT/VTE. Results should be correlated with clinical presentation.   I-stat troponin, ED     Status: Abnormal   Collection Time: 09/25/16 11:43 AM  Result Value Ref Range   Troponin i, poc 7.93 (HH) 0.00 - 0.08 ng/mL   Comment NOTIFIED PHYSICIAN    Comment 3            Comment: Due to the release kinetics of cTnI, a negative result within the first hours of the onset of symptoms does not rule out myocardial infarction with certainty. If myocardial infarction is still suspected, repeat the test at appropriate intervals.   TSH     Status: None   Collection Time: 09/25/16 12:18 PM  Result Value Ref Range   TSH 0.733 0.350 - 4.500 uIU/mL    Comment: Performed by a 3rd Generation  assay with a functional sensitivity of <=0.01 uIU/mL.  Glucose, capillary     Status: Abnormal   Collection Time: 09/25/16  3:03 PM  Result Value Ref Range   Glucose-Capillary 165 (H) 65 - 99 mg/dL   Comment 1 Capillary Specimen     Labs:   Lab Results  Component Value Date   WBC 11.0 (H) 09/25/2016   HGB 15.6 09/25/2016   HCT 45.4 09/25/2016   MCV 88.5 09/25/2016   PLT 179 09/25/2016  Recent Labs Lab 09/25/16 1125  NA 138  K 4.5  CL 100*  CO2 28  BUN 27*  CREATININE 1.98*  CALCIUM 9.5  GLUCOSE 256*    Lipid Panel  No results found for: CHOL, TRIG, HDL, CHOLHDL, VLDL, LDLCALC  BNP (last 3 results) No results for input(s): BNP in the last 8760 hours.  HEMOGLOBIN A1C No results found for: HGBA1C, MPG  Cardiac Panel (last 3 results)  Recent Labs  09/25/16 1118  TROPONINI 11.44*    Lab Results  Component Value Date   TROPONINI 11.44 (HH) 09/25/2016     TSH  Recent Labs  09/25/16 1218  TSH 0.733     Medications Prior to Admission  Medication Sig Dispense Refill  . aspirin EC 81 MG tablet Take 81 mg by mouth daily.    Marland Kitchen BYSTOLIC 20 MG TABS Take 20 mg by mouth daily. daily        Current Facility-Administered Medications:  .  0.9 %  sodium chloride infusion, , Intravenous, Continuous, Lacretia Leigh, MD, Last Rate: 125 mL/hr at 09/25/16 1311 .  0.9 %  sodium chloride infusion, , Intravenous, Continuous, Adrian Prows, MD, Stopped at 09/25/16 1500 .  [START ON 09/26/2016] aspirin EC tablet 81 mg, 81 mg, Oral, Daily, Adrian Prows, MD .  atorvastatin (LIPITOR) tablet 80 mg, 80 mg, Oral, q1800, Adrian Prows, MD .  feeding supplement (ENSURE ENLIVE) (ENSURE ENLIVE) liquid 237 mL, 237 mL, Oral, BID BM, Adrian Prows, MD .  heparin ADULT infusion 100 units/mL (25000 units/227m sodium chloride 0.45%), 1,100 Units/hr, Intravenous, Continuous, Wofford, Drew A, RPH, Last Rate: 11 mL/hr at 09/25/16 1500, 1,100 Units/hr at 09/25/16 1500 .  hydrALAZINE (APRESOLINE)  injection 10 mg, 10 mg, Intravenous, Q4H PRN, GAdrian Prows MD .  isosorbide mononitrate (IMDUR) 24 hr tablet 60 mg, 60 mg, Oral, Daily, GAdrian Prows MD .  metoprolol tartrate (LOPRESSOR) tablet 25 mg, 25 mg, Oral, BID, GAdrian Prows MD, 25 mg at 09/25/16 1309 .  nitroGLYCERIN (NITROSTAT) SL tablet 0.4 mg, 0.4 mg, Sublingual, Q5 Min x 3 PRN, GAdrian Prows MD .  ondansetron ([facility] injection 4 mg, 4 mg, Intravenous, Q6H PRN, GAdrian Prows MD  CARDIAC STUDIES: EKG 09/24/2016: Normal sinus rhythm/sinus bradycardia at a rate of 57 bpm, early R-wave progression anteriorly, cannot exclude posterior infarct, old. Voltage criteria for LVH.  ECHO pending   Assessment/Plan  1. 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 2. Hyperglycemia, blood sugar is in the range of new-onset diabetes mellitus. 3. Hyperlipidemia 4. Chronic stage III kidney disease 5. Hypertension 6. Unilateral kidney, history of right nephrectomy remotely.  Recommendation: Patient is presently hemodynamically stable and essentially asymptomatic without chest pain. Due to chronic renal insufficiency although serum creatinine appears to be stable, would like to hydrate him overnight and plan on performing cardiac catheterization in the morning to evaluate coronary anatomy and will limit contrast use. I had a very long discussion with the patient and his family at the bedside regarding the risks of cardiac catheterization.  Discussed risks, benefits and alternatives of angiogram including but not limited to <1% risk of death, stroke, MI, need for urgent surgical revascularization, 2-3% renal failure, but not limited to thest. patient is willing to proceed. I will obtain echocardiogram, will also check his A1c and I'll start him on sliding scale insulin for now. Will avoid ACE inhibitors for now.  GAdrian Prows MD 09/25/2016, 4:05 PM [facility] [place] Pager: [phone] Office: [phone] If no answer: Cell:  [phone]

## 2016-09-25 NOTE — ED Notes (Signed)
Spoke with lab at 1300 and they are able to run labs on tubes that are already in the lab.

## 2016-09-25 NOTE — ED Notes (Signed)
EKG GIVEN BY CHARGE TIM SMITH RN TO EDP ALLEN. NO ORDERS GIVEN AT THIS TIME. CARELINK IN ROUTE. REPORT GIVEN TO Judeth Cornfield

## 2016-09-25 NOTE — ED Notes (Signed)
ADMISSION RN [place]

## 2016-09-25 NOTE — ED Notes (Signed)
Elevated Troponin reported to Casey Burkitt and Zenia Resides MD

## 2016-09-25 NOTE — Plan of Care (Signed)
Problem: Pain Managment: Goal: General experience of comfort will improve Outcome: Progressing Patient pain is down to a 2 or less

## 2016-09-25 NOTE — ED Provider Notes (Signed)
[facility] DEPT Provider Note   CSN: [id number] Arrival date & time: 09/25/16  1048     History   Chief Complaint Chief Complaint  Patient presents with  . Chest Pain    HPI [name] is a 76 y.o. male.  76 year old male presents with left-sided chest pain 2 days. Pain has been persistent but does become more intense at times. States that yesterday he was mowing his lawn when he developed worsening discomfort which radiated to his shoulder blade as well as up his neck and was associated with dyspnea. No associated diaphoresis but some nausea. Denies any prior history of cardiac disease. No recent fever or cough or congestion. No lower extremity edema. States that his pain currently is much better. No treatment use prior to arrival      Past Medical History:  Diagnosis Date  . Borderline diabetes   . Chronic kidney disease   . Hyperlipidemia   . Hypertension   . MGUS (monoclonal gammopathy of unknown significance)   . Migraine   . 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 Prostate cancer 2006   s/p brachytherapy  . Seborrheic keratosis     Patient Active Problem List   Diagnosis Date Noted  . MGUS (monoclonal gammopathy of unknown significance)   . Hypertension   . Chronic kidney disease   . Hyperlipidemia   . Borderline diabetes   . Seborrheic keratosis     No past surgical history on file.     Home Medications    Prior to Admission medications   Medication Sig Start Date End Date Taking? Authorizing Provider  BYSTOLIC 20 MG TABS Take 20 mg by mouth. daily 10/14/11   [provider]  glimepiride (AMARYL) 4 MG tablet Take 4 mg by mouth daily with breakfast.    [provider]  lisinopril-hydrochlorothiazide (PRINZIDE,ZESTORETIC) 20-12.5 MG per tablet Take 1 tablet by mouth daily.    [provider]  simvastatin (ZOCOR) 20 MG tablet Take 20 mg by mouth at bedtime.      [provider]     Family History No family history on file.  Social History Social History  Substance Use Topics  . Smoking status: Not on file  . Smokeless tobacco: Not on file  . Alcohol use Not on file     Allergies   Sulfa antibiotics   Review of Systems Review of Systems  All other systems reviewed and are negative.    Physical Exam Updated Vital Signs BP (!) 158/77 (BP Location: Right Arm)   Pulse 64   Temp 98.2 F (36.8 C) (Oral)   Resp 16   SpO2 97%   Physical Exam  Constitutional: He is oriented to person, place, and time. He appears well-developed and well-nourished.  Non-toxic appearance. No distress.  HENT:  Head: Normocephalic and atraumatic.  Eyes: Conjunctivae, EOM and lids are normal. Pupils are equal, round, and reactive to light.  Neck: Normal range of motion. Neck supple. No tracheal deviation present. No thyroid mass present.  Cardiovascular: Normal rate, regular rhythm and normal heart sounds.  Exam reveals no gallop.   No murmur heard. Pulmonary/Chest: Effort normal and breath sounds normal. No stridor. No respiratory distress. He has no decreased breath sounds. He has no wheezes. He has no rhonchi. He has no rales.  Abdominal: Soft. Normal appearance and bowel sounds are normal. He exhibits no distension. There is no tenderness. There is no rebound and no CVA tenderness.  Musculoskeletal: Normal range of motion. He exhibits no edema or tenderness.  Neurological: He is alert and oriented to person, place, and time. He has normal strength. No cranial nerve deficit or sensory deficit. GCS eye subscore is 4. GCS verbal subscore is 5. GCS motor subscore is 6.  Skin: Skin is warm and dry. No abrasion and no rash noted.  Psychiatric: He has a normal mood and affect. His speech is normal and behavior is normal.  Nursing note and vitals reviewed.    ED Treatments / Results  Labs (all labs ordered are listed, but only abnormal results are displayed) Labs Reviewed   [place], ED    EKG  EKG Interpretation None     ED ECG REPORT   Date: 09/25/2016  Rate: 66  Rhythm: normal sinus rhythm  QRS Axis: normal  Intervals: normal  ST/T Wave abnormalities: normal  Conduction Disutrbances:none  Narrative Interpretation:   Old EKG Reviewed: none available  I have personally reviewed the EKG tracing and agree with the computerized printout as noted.  Radiology No results found.  Procedures Procedures (including critical care time)  Medications Ordered in ED Medications - No data to display   Initial Impression / Assessment and Plan / ED Course  I have reviewed the triage vital signs and the nursing notes.  Pertinent labs & imaging results that were available during my care of the patient were reviewed by me and considered in my medical decision making (see chart for details).     Patient's troponin elevated at 7.83. EKG shows no sign of acute ischemic changes. Will be heparinized per pharmacy. He had his aspirin today. He is currently pain-free. Will consult cardiology for admission and transfer to [facility].  Final Clinical Impressions(s) / ED Diagnoses   Final diagnoses:  None    New Prescriptions New Prescriptions   No medications on file     Lacretia Leigh, MD 09/25/16 1210

## 2016-09-25 NOTE — ED Notes (Signed)
ED Provider at bedside. 

## 2016-09-25 NOTE — Progress Notes (Signed)
[place] for IV heparin Indication: chest pain/ACS  Allergies  Allergen Reactions  . Sulfa Antibiotics Hives    Patient Measurements: Height: 6' (182.9 cm) Weight: 186 lb 4.6 oz (84.5 kg) IBW/kg (Calculated) : 77.6 Heparin Dosing Weight: 85 kg  Vital Signs: Temp: 98.4 F (36.9 C) (05/16 2000) Temp Source: Oral (05/16 2000) BP: 123/65 (05/16 2000) Pulse Rate: 67 (05/16 2000)  Labs:  Recent Labs  09/25/16 1118 09/25/16 1125 09/25/16 2008  HGB  --  15.6  --   HCT  --  45.4  --   PLT  --  179  --   APTT 27  --   --   LABPROT 12.8  --   --   INR 0.96  --   --   HEPARINUNFRC  --   --  0.22*  CREATININE  --  1.98*  --   TROPONINI 11.44*  --   --     Estimated Creatinine Clearance: 34.3 mL/min (A) (by C-G formula based on SCr of 1.98 mg/dL (H)).   Assessment: 13 yoM with PMH CKD, HLD, HTN, MGUS, R nephrectomy, prostate Ca; presents with L chest pain x 2d. No prior CAD Hx. Troponins elevated but no ischemic changes on EKG.   Initial heparin level = 0.22, troponins trending down   Goal of Therapy: Heparin level 0.3-0.7 units/ml Monitor platelets by anticoagulation protocol: Yes  Plan: Heparin 1500 unit iv bolus x 1 Increase heparin to 1300 units / hr Follow up AM labs  Cath planned for 5/17  Thank you Anette Guarneri, PharmD [phone]  09/25/2016, 9:27 PM

## 2016-09-26 ENCOUNTER — Inpatient Hospital Stay (HOSPITAL_COMMUNITY): Payer: Medicare Other

## 2016-09-26 ENCOUNTER — Encounter (HOSPITAL_COMMUNITY)
Admission: EM | Disposition: A | Discharge status: Home / Self Care | Payer: Self-pay | Source: Home / Self Care | Attending: Cardiology

## 2016-09-26 ENCOUNTER — Other Ambulatory Visit: Payer: Self-pay

## 2016-09-26 ENCOUNTER — Encounter (HOSPITAL_COMMUNITY): Payer: Self-pay | Admitting: Cardiology

## 2016-09-26 ENCOUNTER — Ambulatory Visit (HOSPITAL_COMMUNITY): Admission: RE | Admit: 2016-09-26 | Payer: Medicare Other | Source: Ambulatory Visit | Admitting: Cardiology

## 2016-09-26 DIAGNOSIS — R2231 Localized swelling, mass and lump, right upper limb: Secondary | ICD-10-CM

## 2016-09-26 DIAGNOSIS — I214 Non-ST elevation (NSTEMI) myocardial infarction: Secondary | ICD-10-CM

## 2016-09-26 HISTORY — PX: CORONARY STENT INTERVENTION: CATH118234

## 2016-09-26 HISTORY — PX: LEFT HEART CATH AND CORONARY ANGIOGRAPHY: CATH118249

## 2016-09-26 LAB — GLUCOSE, CAPILLARY
Glucose-Capillary: 132 mg/dL — ABNORMAL HIGH (ref 65–99)
Glucose-Capillary: 181 mg/dL — ABNORMAL HIGH (ref 65–99)
Glucose-Capillary: 186 mg/dL — ABNORMAL HIGH (ref 65–99)

## 2016-09-26 LAB — CBC
HEMATOCRIT: 40.3 % (ref 39.0–52.0)
Hemoglobin: 13.7 g/dL (ref 13.0–17.0)
MCH: 30.3 pg (ref 26.0–34.0)
MCHC: 34 g/dL (ref 30.0–36.0)
MCV: 89.2 fL (ref 78.0–100.0)
Platelets: 146 10*3/uL — ABNORMAL LOW (ref 150–400)
RBC: 4.52 MIL/uL (ref 4.22–5.81)
RDW: 13.3 % (ref 11.5–15.5)
WBC: 8.6 10*3/uL (ref 4.0–10.5)

## 2016-09-26 LAB — POCT ACTIVATED CLOTTING TIME
ACTIVATED CLOTTING TIME: 274 s
Activated Clotting Time: 257 seconds
Activated Clotting Time: 263 seconds
Activated Clotting Time: 290 seconds

## 2016-09-26 LAB — BASIC METABOLIC PANEL
Anion gap: 7 (ref 5–15)
BUN: 26 mg/dL — AB (ref 6–20)
CHLORIDE: 107 mmol/L (ref 101–111)
CO2: 24 mmol/L (ref 22–32)
CREATININE: 1.77 mg/dL — AB (ref 0.61–1.24)
Calcium: 8.3 mg/dL — ABNORMAL LOW (ref 8.9–10.3)
GFR calc Af Amer: 41 mL/min — ABNORMAL LOW (ref >60)
GFR calc non Af Amer: 35 mL/min — ABNORMAL LOW (ref >60)
Glucose, Bld: 142 mg/dL — ABNORMAL HIGH (ref 65–99)
Potassium: 3.9 mmol/L (ref 3.5–5.1)
SODIUM: 138 mmol/L (ref 135–145)

## 2016-09-26 LAB — HEPARIN LEVEL (UNFRACTIONATED): HEPARIN UNFRACTIONATED: 0.52 [IU]/mL (ref 0.30–0.70)

## 2016-09-26 LAB — TROPONIN I: Troponin I: 11.98 ng/mL (ref <0.03)

## 2016-09-26 SURGERY — LEFT HEART CATH AND CORONARY ANGIOGRAPHY
Anesthesia: LOCAL | Laterality: Right

## 2016-09-26 MED ORDER — NITROGLYCERIN 1 MG/10 ML FOR IR/CATH LAB
INTRA_ARTERIAL | Status: DC | PRN
  Administered 2016-09-26 (×3): 200 ug via INTRACORONARY

## 2016-09-26 MED ORDER — HEPARIN (PORCINE) IN NACL 2-0.9 UNIT/ML-% IJ SOLN
INTRAMUSCULAR | Status: AC | PRN
  Administered 2016-09-26: 1000 mL

## 2016-09-26 MED ORDER — PERFLUTREN LIPID MICROSPHERE
1.0000 mL | INTRAVENOUS | Status: AC | PRN
  Administered 2016-09-26: 4 mL via INTRAVENOUS
  Filled 2016-09-26: qty 10

## 2016-09-26 MED ORDER — HEPARIN SODIUM (PORCINE) 1000 UNIT/ML IJ SOLN
INTRAMUSCULAR | Status: DC | PRN
  Administered 2016-09-26: 8000 [IU] via INTRAVENOUS
  Administered 2016-09-26 (×2): 3000 [IU] via INTRAVENOUS
  Administered 2016-09-26: 2000 [IU] via INTRAVENOUS

## 2016-09-26 MED ORDER — PERFLUTREN LIPID MICROSPHERE
INTRAVENOUS | Status: AC
  Administered 2016-09-26: 4 mL via INTRAVENOUS
  Filled 2016-09-26: qty 10

## 2016-09-26 MED ORDER — TICAGRELOR 90 MG PO TABS
ORAL_TABLET | ORAL | Status: DC | PRN
  Administered 2016-09-26: 180 mg via ORAL

## 2016-09-26 MED ORDER — SODIUM CHLORIDE 0.9 % IV SOLN: INTRAVENOUS | Status: AC

## 2016-09-26 MED ORDER — TICAGRELOR 90 MG PO TABS
90.0000 mg | ORAL_TABLET | Freq: Two times a day (BID) | ORAL | Status: DC
  Administered 2016-09-26 – 2016-09-28 (×4): 90 mg via ORAL
  Filled 2016-09-26 (×4): qty 1

## 2016-09-26 MED ORDER — FENTANYL CITRATE (PF) 100 MCG/2ML IJ SOLN
INTRAMUSCULAR | Status: DC | PRN
  Administered 2016-09-26 (×2): 50 ug via INTRAVENOUS

## 2016-09-26 MED ORDER — SODIUM CHLORIDE 0.9% FLUSH
3.0000 mL | INTRAVENOUS | Status: DC | PRN
Start: 2016-09-26 — End: 2016-09-28

## 2016-09-26 MED ORDER — IOPAMIDOL (ISOVUE-370) INJECTION 76%
INTRAVENOUS | Status: DC | PRN
  Administered 2016-09-26: 150 mL via INTRA_ARTERIAL

## 2016-09-26 MED ORDER — MIDAZOLAM HCL 2 MG/2ML IJ SOLN
INTRAMUSCULAR | Status: DC | PRN
  Administered 2016-09-26: 2 mg via INTRAVENOUS

## 2016-09-26 MED ORDER — SODIUM CHLORIDE 0.9 % IV SOLN: 250.0000 mL | INTRAVENOUS | Status: DC | PRN

## 2016-09-26 MED ORDER — VERAPAMIL HCL 2.5 MG/ML IV SOLN
INTRA_ARTERIAL | Status: DC | PRN
  Administered 2016-09-26: 10 mL via INTRA_ARTERIAL

## 2016-09-26 MED ORDER — SODIUM CHLORIDE 0.9% FLUSH
3.0000 mL | Freq: Two times a day (BID) | INTRAVENOUS | Status: DC
  Administered 2016-09-26 – 2016-09-27 (×4): 3 mL via INTRAVENOUS

## 2016-09-26 MED ORDER — SODIUM CHLORIDE 0.9 % IV SOLN
INTRAVENOUS | Status: AC | PRN
  Administered 2016-09-26: 500 mL via INTRAVENOUS

## 2016-09-26 MED ORDER — FENTANYL CITRATE (PF) 100 MCG/2ML IJ SOLN
25.0000 ug | INTRAMUSCULAR | Status: DC | PRN
  Administered 2016-09-26 (×2): 50 ug via INTRAVENOUS
  Filled 2016-09-26 (×2): qty 2

## 2016-09-26 MED ORDER — LIDOCAINE HCL (PF) 1 % IJ SOLN
INTRAMUSCULAR | Status: DC | PRN
  Administered 2016-09-26: 2 mL via INTRADERMAL

## 2016-09-26 SURGICAL SUPPLY — 24 items
BALLN EUPHORA RX 2.0X20 (BALLOONS) ×3
BALLN EUPHORA RX 2.0X6 (BALLOONS) ×3
BALLN EUPHORA RX 3.0X15 (BALLOONS) ×3
BALLOON EUPHORA RX 2.0X20 (BALLOONS) ×2 IMPLANT
BALLOON EUPHORA RX 2.0X6 (BALLOONS) ×2 IMPLANT
BALLOON EUPHORA RX 3.0X15 (BALLOONS) ×2 IMPLANT
CATH OPTITORQUE TIG 4.0 5F (CATHETERS) ×3 IMPLANT
CATH VISTA GUIDE 6FR XBLAD3.5 (CATHETERS) ×3 IMPLANT
DEVICE RAD COMP TR BAND LRG (VASCULAR PRODUCTS) ×3 IMPLANT
GLIDESHEATH SLEND A-KIT 6F 20G (SHEATH) ×3 IMPLANT
GUIDEWIRE INQWIRE 1.5J.035X260 (WIRE) ×2 IMPLANT
INQWIRE 1.5J .035X260CM (WIRE) ×3
KIT ENCORE 26 ADVANTAGE (KITS) ×6 IMPLANT
KIT HEART LEFT (KITS) ×3 IMPLANT
PACK CARDIAC CATHETERIZATION (CUSTOM PROCEDURE TRAY) ×3 IMPLANT
STENT RESOLUTE ONYX 2.0X26 (Permanent Stent) ×3 IMPLANT
STENT RESOLUTE ONYX 2.5X18 (Permanent Stent) ×3 IMPLANT
STENT RESOLUTE ONYX 3.0X15 (Permanent Stent) ×3 IMPLANT
STENT RESOLUTE ONYX 3.0X26 (Permanent Stent) ×3 IMPLANT
TRANSDUCER W/STOPCOCK (MISCELLANEOUS) ×3 IMPLANT
TUBING CIL FLEX 10 FLL-RA (TUBING) ×3 IMPLANT
WIRE COUGAR XT STRL 190CM (WIRE) ×3 IMPLANT
WIRE HI TORQ WHISPER MS 190CM (WIRE) ×3 IMPLANT
WIRE RUNTHROUGH .014X180CM (WIRE) ×3 IMPLANT

## 2016-09-26 NOTE — Progress Notes (Signed)
Call from cath lab; heparin stopped [phone]

## 2016-09-26 NOTE — Progress Notes (Signed)
  Echocardiogram 2D Echocardiogram has been performed.  Darlina Sicilian M 09/26/2016, 11:41 AM

## 2016-09-26 NOTE — Progress Notes (Signed)
Pt. Returned from cath lab with small amount of swelling around TR band. Within 30 min swelling had traveled up arm a little bit and I called cath lab.  Aaron Edelman came and repositioned TR band. Swelling was marked. Swelling continued and cath lab came back and added a second TR band.  Dr. Einar Gip made aware.  Circumference measurements begin every 15 min.  Swelling continued to grow.  Aaron Edelman came back and took off second TR band and held pressure for about 30 min.  Dr. Einar Gip made aware and vascular surgery consulted. Swelling went down after pressure was held and we began to deflate first TR band.   Pt. TR band off at 1900.  Swelling is down and brusing is marked.  Vascular surgery has seen and felt there was low risk of compartment syndrome.  Will continue to monitor closely.  Pt. Is stable and is not complaining of pain.

## 2016-09-26 NOTE — Consult Note (Signed)
Patient name: [name] MRN: [id number] DOB: [birth year] Sex: male  REASON FOR CONSULT:    Rule out compartment syndrome post cardiac cath. Consult is from Dr. Einar Gip.   HPI:   [name] is a 76 y.o. male who presented with NSTEMI. He underwent a cardiac catheterization today. This was done via a radial artery access with a 6 French sheath. He reportedly had some swelling in the forearm after the procedure and vascular surgeries consult to rule out a compartment syndrome.  Currently he still has a compression device on his wrist. He denies any significant pain in the right forearm. He has some mild paresthesias in his fingertips on the right hand.  Current Facility-Administered Medications  Medication Dose Route Frequency Provider Last Rate Last Dose  . 0.9 %  sodium chloride infusion   Intravenous Continuous Adrian Prows, MD 125 mL/hr at 09/26/16 1100    . 0.9 %  sodium chloride infusion  250 mL Intravenous PRN Adrian Prows, MD      . aspirin EC tablet 81 mg  81 mg Oral Daily Adrian Prows, MD   81 mg at 09/26/16 0554  . atorvastatin (LIPITOR) tablet 80 mg  80 mg Oral q1800 Adrian Prows, MD   80 mg at 09/25/16 1721  . feeding supplement (ENSURE ENLIVE) (ENSURE ENLIVE) liquid 237 mL  237 mL Oral BID BM Adrian Prows, MD      . fentaNYL (SUBLIMAZE) injection 25-50 mcg  25-50 mcg Intravenous Q4H PRN Adrian Prows, MD   50 mcg at 09/26/16 1500  . hydrALAZINE (APRESOLINE) injection 10 mg  10 mg Intravenous Q4H PRN Adrian Prows, MD      . insulin aspart (novoLOG) injection 0-15 Units  0-15 Units Subcutaneous TID WC Adrian Prows, MD   3 Units at 09/25/16 1721  . insulin aspart (novoLOG) injection 3 Units  3 Units Subcutaneous TID WC Adrian Prows, MD   3 Units at 09/25/16 1722  . isosorbide mononitrate (IMDUR) 24 hr tablet 60 mg  60 mg Oral Daily Adrian Prows, MD   60 mg at 09/25/16 1720  . nebivolol (BYSTOLIC) tablet 20 mg  20 mg Oral Daily Adrian Prows, MD      . nitroGLYCERIN (NITROSTAT) SL tablet 0.4  mg  0.4 mg Sublingual Q5 Min x 3 PRN Adrian Prows, MD      . ondansetron [facility]) injection 4 mg  4 mg Intravenous Q6H PRN Adrian Prows, MD      . sodium chloride flush (NS) 0.9 % injection 3 mL  3 mL Intravenous Q12H Adrian Prows, MD      . sodium chloride flush (NS) 0.9 % injection 3 mL  3 mL Intravenous PRN Adrian Prows, MD      . ticagrelor (BRILINTA) tablet 90 mg  90 mg Oral BID Adrian Prows, MD        REVIEW OF SYSTEMS:  [X]  denotes positive finding, [ ]  denotes negative finding Cardiac  Comments:  Chest pain or chest pressure:    Shortness of breath upon exertion:    Short of breath when lying flat:    Irregular heart rhythm:    Constitutional    Fever or chills:     PHYSICAL EXAM:   Vitals:   09/26/16 1430 09/26/16 1445 09/26/16 1500 09/26/16 1634  BP: 116/64 (!) 119/55 125/61   Pulse: 60 (!) 59 64   Resp: 17 (!) 22 20   Temp:    98.1 F (36.7 C)  TempSrc:  Oral  SpO2: 98% 99% 99%   Weight:      Height:        GENERAL: The patient is a well-nourished male, in no acute distress. The vital signs are documented above. CARDIOVASCULAR: There is a regular rate and rhythm. PULMONARY: There is good air exchange bilaterally without wheezing or rales. He has a radial, ulnar, and palmar arch signal with the Doppler which is fairly brisk. The left forearm is soft without evidence of a compartment syndrome. He has moderate ecchymosis.  DATA:   Hgb = 13.7  MEDICAL ISSUES:   RIGHT FOREARM SWELLING STATUS POST CARDIAC CATHETERIZATION: Currently he has no significant swelling in the right forearm and no evidence of a compartment syndrome. Motor and sensory function is intact. He has excellent Doppler signals in the radial, ulnar and palmar arch positions with the decompression device still in place. At this point I see no indication for surgical intervention. Will follow.  Deitra Mayo [facility] [phone]

## 2016-09-26 NOTE — Interval H&P Note (Signed)
History and Physical Interval Note:  09/26/2016 7:45 AM  [name]  has presented today for surgery, with the diagnosis of ACS  The various methods of treatment have been discussed with the patient and family. After consideration of risks, benefits and other options for treatment, the patient has consented to  Procedure(s): Left Heart Cath and Coronary Angiography (N/A) and possible angioplasty as a surgical intervention .  The patient's history has been reviewed, patient examined, no change in status, stable for surgery.  I have reviewed the patient's chart and labs.  Questions were answered to the patient's satisfaction.   Cath Lab Visit (complete for each Cath Lab visit)  Clinical Evaluation Leading to the Procedure:   ACS: Yes.    Non-ACS:    Anginal Classification: CCS IV  Anti-ischemic medical therapy: Minimal Therapy (1 class of medications)  Non-Invasive Test Results: No non-invasive testing performed  Prior CABG: No previous CABG        Adrian Prows

## 2016-09-26 NOTE — Progress Notes (Signed)
Pt. Is having 3/10 chest pain and some SOB.  No EKG changes and ECHO looks normal per MD.  O2 applied for comfort and Dr. Einar Gip made aware.  Fentanyl ordered.  Will continue to monitor closely.

## 2016-09-26 NOTE — Progress Notes (Signed)
Called to check right arm hematoma. Hematoma present in right arm past mid forearm. Right thumb swollen but not hard. Blood pressure cuff applied to mid forearm for 5 minutes, maintaining pleth waveform. After 2 cycles of 5 minutes, hematoma was softer and reduced. Tr band deflation initiated, to alleviate venous distention in right thumb.   Dr. Einar Gip notififed.

## 2016-09-26 NOTE — Progress Notes (Signed)
Called to 2h for right radial hematoma. 1 inch by 2 inch hematoma present proximal to TR band site. Tr band adjusted proximal and reinflated to 11 cc air. No further hematoma noted. SP02 98% as measured in right hand.

## 2016-09-26 NOTE — Progress Notes (Signed)
[place] for IV heparin Indication: chest pain/ACS  Allergies  Allergen Reactions  . Sulfa Antibiotics Hives    Patient Measurements: Height: 6' (182.9 cm) Weight: 188 lb 14.4 oz (85.7 kg) IBW/kg (Calculated) : 77.6 Heparin Dosing Weight: 85 kg  Vital Signs: Temp: 98.7 F (37.1 C) (05/17 0400) Temp Source: Oral (05/17 0400) BP: 130/67 (05/17 0700) Pulse Rate: 56 (05/17 0700)  Labs:  Recent Labs  09/25/16 1118 09/25/16 1125 09/25/16 2008 09/26/16 0136  HGB  --  15.6  --  13.7  HCT  --  45.4  --  40.3  PLT  --  179  --  146*  APTT 27  --   --   --   LABPROT 12.8  --   --   --   INR 0.96  --   --   --   HEPARINUNFRC  --   --  0.22* 0.52  CREATININE  --  1.98*  --  1.77*  TROPONINI 11.44*  --  11.10* 11.98*    Estimated Creatinine Clearance: 38.4 mL/min (A) (by C-G formula based on SCr of 1.77 mg/dL (H)).   Assessment: 73 yoM with PMH CKD, HLD, HTN, MGUS, R nephrectomy, prostate Ca; presents with L chest pain x 2d. No prior CAD Hx. Troponins elevated but no ischemic changes on EKG.   First heparin level therapeutic at 0.52 on 1300 units/hr. Hemoglobin is within normal limits and platelet count down slightly.    Goal of Therapy: Heparin level 0.3-0.7 units/ml Monitor platelets by anticoagulation protocol: Yes  Plan: Heparin 1300 units/hr IV - stopped at 0700 for cath  Daily heparin level and CBC while on heparin Follow up plans post-cath  Demetrius Charity, PharmD Acute Care Pharmacy Resident  Pager: [phone] 09/26/2016

## 2016-09-27 LAB — BASIC METABOLIC PANEL
ANION GAP: 8 (ref 5–15)
BUN: 22 mg/dL — ABNORMAL HIGH (ref 6–20)
CHLORIDE: 106 mmol/L (ref 101–111)
CO2: 24 mmol/L (ref 22–32)
Calcium: 8 mg/dL — ABNORMAL LOW (ref 8.9–10.3)
Creatinine, Ser: 1.7 mg/dL — ABNORMAL HIGH (ref 0.61–1.24)
GFR calc Af Amer: 43 mL/min — ABNORMAL LOW (ref >60)
GFR, EST NON AFRICAN AMERICAN: 37 mL/min — AB (ref >60)
GLUCOSE: 135 mg/dL — AB (ref 65–99)
Potassium: 3.7 mmol/L (ref 3.5–5.1)
Sodium: 138 mmol/L (ref 135–145)

## 2016-09-27 LAB — GLUCOSE, CAPILLARY
GLUCOSE-CAPILLARY: 138 mg/dL — AB (ref 65–99)
GLUCOSE-CAPILLARY: 135 mg/dL — AB (ref 65–99)
GLUCOSE-CAPILLARY: 233 mg/dL — AB (ref 65–99)
GLUCOSE-CAPILLARY: 96 mg/dL (ref 65–99)

## 2016-09-27 LAB — HEMOGLOBIN A1C
HEMOGLOBIN A1C: 7.5 % — AB (ref 4.8–5.6)
MEAN PLASMA GLUCOSE: 169 mg/dL

## 2016-09-27 LAB — ECHOCARDIOGRAM COMPLETE
Height: 72 in
WEIGHTICAEL: 3022.4 [oz_av]

## 2016-09-27 LAB — CBC
HEMATOCRIT: 36.3 % — AB (ref 39.0–52.0)
HEMOGLOBIN: 12 g/dL — AB (ref 13.0–17.0)
MCH: 29.6 pg (ref 26.0–34.0)
MCHC: 33.1 g/dL (ref 30.0–36.0)
MCV: 89.6 fL (ref 78.0–100.0)
Platelets: 144 10*3/uL — ABNORMAL LOW (ref 150–400)
RBC: 4.05 MIL/uL — ABNORMAL LOW (ref 4.22–5.81)
RDW: 13 % (ref 11.5–15.5)
WBC: 9.1 10*3/uL (ref 4.0–10.5)

## 2016-09-27 MED ORDER — GLYBURIDE 2.5 MG PO TABS
2.5000 mg | ORAL_TABLET | Freq: Two times a day (BID) | ORAL | Status: DC
  Administered 2016-09-27 – 2016-09-28 (×2): 2.5 mg via ORAL
  Filled 2016-09-27 (×3): qty 1

## 2016-09-27 MED ORDER — LIVING WELL WITH DIABETES BOOK
Freq: Once | Status: AC
  Administered 2016-09-27: 15:00:00
  Filled 2016-09-27: qty 1

## 2016-09-27 NOTE — Progress Notes (Signed)
CARDIAC REHAB PHASE I   PRE:  Rate/Rhythm: 35 SR  BP:  Sitting: 130/64        SaO2: 98 RA  MODE:  Ambulation: 350 ft   POST:  Rate/Rhythm: 70 SR  BP:  Sitting: 144/89         SaO2: 99 RA  Pt ambulated 350 ft on RA, handheld assist, steady gait, tolerated well with no complaints. Completed MI/stent education with pt and family at bedside.  Reviewed risk factors, MI book, anti-platelet therapy, stent card (unable to locate), activity restrictions, ntg, exercise, heart healthy diet, carb counting, portion control and phase 2 cardiac rehab. Pt verbalized understanding, receptive to education. Pt agrees to phase 2 cardiac rehab referral, will send to [facility] per pt request. Pt to recliner after walk, call bell within reach. Will follow.    [place], RN, BSN 09/27/2016 10:27 AM

## 2016-09-27 NOTE — Care Management Note (Signed)
Case Management Note Marvetta Gibbons RN, BSN Unit 2W-Case Manager [phone]   Patient Details  Name: [name] MRN: [id number] Date of Birth: 18-Oct-1940  Subjective/Objective:  Pt admitted with NSTEMI- s/p PTCA and stenting                 Action/Plan: PTA pt lived at home with spouse- independent- plan to return home- noted pt started on brilinta- insurance check submitted- copay cost $45- spoke with pt and wife at bedside- coverage info shared and pt given 30 day free card to use on discharge- per pt he uses [facility] call made to pharmacy which does have drug in stock to fill on discharge. Pt is asking about his stent info cards- MD please make sure pt has cards for discharge-did not see on shadow chart.   Expected Discharge Date:  09/28/16               Expected Discharge Plan:  Home/Self Care  In-House Referral:     Discharge planning Services  CM Consult, Medication Assistance  Post Acute Care Choice:    Choice offered to:     DME Arranged:    DME Agency:     HH Arranged:    HH Agency:     Status of Service:  Completed, signed off  If discussed at [place] of Stay Meetings, dates discussed:    Discharge Disposition: home/self care   Additional Comments:  Dawayne Patricia, RN 09/27/2016, 3:33 PM

## 2016-09-27 NOTE — Progress Notes (Signed)
   VASCULAR SURGERY ASSESSMENT & PLAN:   Right forearm looks fine. No evidence of compartment syndrome or compromised circulation.  Vascular will be available as needed.  SUBJECTIVE:   No specific complaints. No paresthesias or weakness in right forearm.  PHYSICAL EXAM:   Vitals:   09/27/16 0300 09/27/16 0400 09/27/16 0500 09/27/16 0600  BP: (!) 132/59 135/64 131/62 (!) 146/70  Pulse: (!) 55 (!) 56 60 (!) 59  Resp: (!) 22 (!) 25 (!) 24 (!) 25  Temp:  98.4 F (36.9 C)    TempSrc:  Oral    SpO2: 98% 100% 98% 99%  Weight:      Height:       Right hand is warm and well perfused.   LABS:   Lab Results  Component Value Date   WBC 9.1 09/27/2016   HGB 12.0 (L) 09/27/2016   HCT 36.3 (L) 09/27/2016   MCV 89.6 09/27/2016   PLT 144 (L) 09/27/2016   Lab Results  Component Value Date   CREATININE 1.70 (H) 09/27/2016   Lab Results  Component Value Date   INR 0.96 09/25/2016   CBG (last 3)   Recent Labs  09/25/16 2234 09/26/16 1632 09/26/16 2208  GLUCAP 132* 181* 186*    PROBLEM LIST:    Active Problems:   NSTEMI (non-ST elevated myocardial infarction) (HCC)   CURRENT MEDS:   . aspirin EC  81 mg Oral Daily  . atorvastatin  80 mg Oral q1800  . feeding supplement (ENSURE ENLIVE)  237 mL Oral BID BM  . insulin aspart  0-15 Units Subcutaneous TID WC  . insulin aspart  3 Units Subcutaneous TID WC  . isosorbide mononitrate  60 mg Oral Daily  . nebivolol  20 mg Oral Daily  . sodium chloride flush  3 mL Intravenous Q12H  . ticagrelor  90 mg Oral BID    Gae Gallop Beeper: [phone] Office: [phone] 09/27/2016

## 2016-09-27 NOTE — Progress Notes (Signed)
Pt ambulating hall with wife.  Will con't plan of care.

## 2016-09-27 NOTE — Progress Notes (Signed)
Visited with patient about his new diagnosis of diabetes.  States that he has checked blood sugars at home on occasion. Had an A1C done in the past with results of 6.9%. Reported to him that his current A1C is 7.5%. He will need to check blood sugars at least twice a day and keep a record of them until he goes back to see his PCP. Will order Living Well with Diabetes booklet and a dietician consult for meal planning. Will need to follow up with PCP at discharge for glucose control. He does have a glucose meter, strips, and lancets at home.  Harvel Ricks RN BSN CDE Diabetes Coordinator Pager: [phone]  8am-5pm

## 2016-09-27 NOTE — Plan of Care (Signed)
Problem: Food- and Nutrition-Related Knowledge Deficit (NB-1.1) Goal: Nutrition education Formal process to instruct or train a patient/client in a skill or to impart knowledge to help patients/clients voluntarily manage or modify food choices and eating behavior to maintain or improve health. Outcome: Completed/Met Date Met: 09/27/16  RD consulted for nutrition education regarding diabetes.   Pt and wife report blood sugar runs 100-140 most of the time.  He drinks sweet tea, water, and coke zero most of the time. He eats 3 meals per day. Breakfast: cereal, Lunch: sandwich, Dinner: out to eat. He loves getting cake from [employer].    Lab Results  Component Value Date   HGBA1C 7.5 (H) 09/25/2016    RD provided "Carbohydrate Counting for People with Diabetes" handout from the Academy of Nutrition and Dietetics. Discussed different food groups and their effects on blood sugar, emphasizing carbohydrate-containing foods. Provided list of carbohydrates and recommended serving sizes of common foods.  Discussed importance of controlled and consistent carbohydrate intake throughout the day. Provided examples of ways to balance meals/snacks and encouraged intake of high-fiber, whole grain complex carbohydrates. Teach back method used.  Expect good compliance.  Body mass index is 25.71 kg/m. Pt meets criteria for overweight based on current BMI.  Current diet order is Heart Healthy, patient is consuming approximately 100% of meals at this time. Labs and medications reviewed. No further nutrition interventions warranted at this time. RD contact information provided. If additional nutrition issues arise, please re-consult RD.  [place], [place], [place] Pager [phone] After Hours Pager

## 2016-09-27 NOTE — Progress Notes (Signed)
Per insurance check for Brilinta # 1.  S/W LOLITA  @ [facility] # [phone]   1. BRILINTA  90 MG BIS    COVER- YES  CO-PAY- $ 45.00 Q/L 2 PER DAY  TIER- 3 DRUG  PRIOR APPROVAL- NO   PREFERRED PHARMACY : [facility] AND [facility]

## 2016-09-27 NOTE — Progress Notes (Signed)
Subjective:  Feels well, no further chest pain, right arm without significant pain.  Objective:  Vital Signs in the last 24 hours: Temp:  [97.9 F (36.6 C)-98.5 F (36.9 C)] 97.9 F (36.6 C) (05/18 0748) Pulse Rate:  [0-75] 66 (05/18 0800) Resp:  [0-173] 17 (05/18 0800) BP: (109-149)/(51-76) 129/64 (05/18 0800) SpO2:  [0 %-100 %] 97 % (05/18 0800) Vitals:   09/27/16 0748 09/27/16 0800  BP:  129/64  Pulse:  66  Resp:  17  Temp: 97.9 F (36.6 C)     Intake/Output from previous day: 05/17 0701 - 05/18 0700 In: 2100 [P.O.:600; I.V.:1500] Out: 325 [Urine:325]  Physical Exam:  General appearance: alert, appears stated age and no distress Eyes: negative findings: lids and lashes normal Neck: no adenopathy, no carotid bruit, no JVD, supple, symmetrical, trachea midline and thyroid not enlarged, symmetric, no tenderness/mass/nodules Neck: JVP - normal, carotids 2+= without bruits Resp: clear to auscultation bilaterally Chest wall: no tenderness Cardio: regular rate and rhythm, S1, S2 normal, no murmur, click, rub or gallop GI: soft, non-tender; bowel sounds normal; no masses,  no organomegaly Extremities: extremities normal, atraumatic, no cyanosis or edema right upper extremity shows ecchymosis in the forearm, excellent radial pulse. Vascular exam is normal.  Lab Results: BMP  Recent Labs  09/25/16 1125 09/26/16 0136 09/27/16 0300  NA 138 138 138  K 4.5 3.9 3.7  CL 100* 107 106  CO2 28 24 24   GLUCOSE 256* 142* 135*  BUN 27* 26* 22*  CREATININE 1.98* 1.77* 1.70*  CALCIUM 9.5 8.3* 8.0*  GFRNONAA 31* 35* 37*  GFRAA 36* 41* 43*    CBC  Recent Labs Lab 09/27/16 0300  WBC 9.1  RBC 4.05*  HGB 12.0*  HCT 36.3*  PLT 144*  MCV 89.6  MCH 29.6  MCHC 33.1  RDW 13.0    HEMOGLOBIN A1C Lab Results  Component Value Date   HGBA1C 7.5 (H) 09/25/2016   MPG 169 09/25/2016    Cardiac Panel (last 3 results)  Recent Labs  09/25/16 1118 09/25/16 2008  09/26/16 0136  TROPONINI 11.44* 11.10* 11.98*    Recent Labs  09/25/16 1218  TSH 0.733    Lipid Panel     Component Value Date/Time   CHOL 199 09/25/2016 1243   TRIG 233 (H) 09/25/2016 1243   HDL 41 09/25/2016 1243   CHOLHDL 4.9 09/25/2016 1243   VLDL 47 (H) 09/25/2016 1243   LDLCALC 111 (H) 09/25/2016 1243     Hepatic Function Panel No results for input(s): PROT, ALBUMIN, AST, ALT, ALKPHOS, BILITOT, BILIDIR, IBILI in the last 8760 hours.  Imaging: Dg Chest 2 View  Result Date: 09/25/2016 CLINICAL DATA:  Mid chest pain radiating to the back increasing since Monday. Patient reports shortness of breath with increased pain episodes. EXAM: CHEST  2 VIEW COMPARISON:  None in PACs FINDINGS: The lungs are reasonably well inflated. There is no focal infiltrate. There is no pleural effusion. The heart is normal in size. The pulmonary vascularity is normal. There is calcification in the wall of the aortic arch. The mediastinum is normal in width. There is mild multilevel degenerative disc disease of the thoracic spine. IMPRESSION: Top-normal cardiac size.  No pulmonary edema or pneumonia. Thoracic aortic atherosclerosis. Electronically Signed   By: David  Martinique M.D.   On: 09/25/2016 11:36    Cardiac Studies:  EKG 09/27/2016: Normal EKG, normal sinus rhythm, unchanged from previous tracings. Early transition of R-wave in the anterior leads, cannot exclude true posterior  infarct. No significant change from yesterday.  ECHO:09/27/2016: Normal LV systolic function without wall motion of normality, no significant valvular abnormality.  Coronary angiogram 09/26/2016: Normal LVEDP. Ostial RCA 80% stenosis, mid and distal RCA 50-60% stenosis, small PDA 80% stenosis. Very large multiple PL branches, mild disease. Circumflex coronary artery mild disease. LAD diffusely diseased in the proximal segment and after the origin of D2, LAD is very small and tapers before reaching the apex. LAD and large  D2 are subtotally occluded with TIMI 1 flow  PTCA and crush stenting of the bifurcating LAD & D1 with implantation of 2.0 x 26 mm resolute onyx distally and 3.0 x 26 mm resolute onyx proximally into the large dominant D1 extending into the mid LAD crushing the mid LAD stent distal to the D1 origin, a 2.5 x 18 mm resolute onyx. Proximal LAD stented with an additional 3.0 x 15 mm resolute onyx stent. Stenosis in the LAD and diagonal 1 reduced from subtotal occlusion to 0% with improvement in TIMI 1 flow to TIMI-3 flow at the end of the procedure.  Assessment/Plan:  1. 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 Has residual hemodynamically significant ostial right stenosis with dampened waveform on engagement with 5 French catheter. 2. 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 Hyperlipidemia 4. Chronic stage III kidney disease 5. Hypertension 6. Unilateral kidney, history of right nephrectomy remotely  Recommendation: He can be transferred to telemetry, cardiac rehabilitation phase I, I will start the patient on glyburide 2.5 mg twice a day. Probably discharge home in the morning if he remains stable serum creatinine remained stable.  Adrian Prows, M.D. 09/27/2016, 8:28 AM [facility] Pager: [phone] Office: [phone] If no answer: [phone]

## 2016-09-28 LAB — CBC
HCT: 35.6 % — ABNORMAL LOW (ref 39.0–52.0)
Hemoglobin: 11.6 g/dL — ABNORMAL LOW (ref 13.0–17.0)
MCH: 29.1 pg (ref 26.0–34.0)
MCHC: 32.6 g/dL (ref 30.0–36.0)
MCV: 89.4 fL (ref 78.0–100.0)
PLATELETS: 152 10*3/uL (ref 150–400)
RBC: 3.98 MIL/uL — ABNORMAL LOW (ref 4.22–5.81)
RDW: 12.9 % (ref 11.5–15.5)
WBC: 9.5 10*3/uL (ref 4.0–10.5)

## 2016-09-28 LAB — GLUCOSE, CAPILLARY
Glucose-Capillary: 51 mg/dL — ABNORMAL LOW (ref 65–99)
Glucose-Capillary: 107 mg/dL — ABNORMAL HIGH (ref 65–99)
Glucose-Capillary: 124 mg/dL — ABNORMAL HIGH (ref 65–99)

## 2016-09-28 MED ORDER — TICAGRELOR 90 MG PO TABS: 90.0000 mg | ORAL_TABLET | Freq: Two times a day (BID) | ORAL | 0 refills | Status: DC

## 2016-09-28 MED ORDER — ISOSORBIDE MONONITRATE ER 60 MG PO TB24: 60.0000 mg | ORAL_TABLET | Freq: Every day | ORAL | 1 refills | Status: DC

## 2016-09-28 MED ORDER — ATORVASTATIN CALCIUM 80 MG PO TABS: 80.0000 mg | ORAL_TABLET | Freq: Every day | ORAL | 1 refills | Status: DC

## 2016-09-28 MED ORDER — GLYBURIDE 2.5 MG PO TABS: 2.5000 mg | ORAL_TABLET | Freq: Every day | ORAL | 0 refills | Status: DC

## 2016-09-28 MED ORDER — NITROGLYCERIN 0.4 MG SL SUBL: 0.4000 mg | SUBLINGUAL_TABLET | SUBLINGUAL | 1 refills | Status: DC | PRN

## 2016-09-28 NOTE — Progress Notes (Signed)
Hypoglycemic Event  CBG:51  Treatment:2 cups orange juice  Symptoms:no  Follow-up CBG: [id number] CBG Result:107  Possible Reasons for Event:unknown  Comments/MD notified:no    Netta Corrigan, Kristl Morioka [employer]

## 2016-09-28 NOTE — Discharge Instructions (Signed)
Acute Coronary Syndrome °Acute coronary syndrome (ACS) is a serious problem in which there is suddenly not enough blood and oxygen supplied to the heart. ACS may mean that one or more of the blood vessels in your heart (coronary arteries) may be blocked. ACS can result in chest pain or a heart attack (myocardial infarction or MI). °What are the causes? °This condition is caused by atherosclerosis, which is the buildup of fat and cholesterol (plaque) on the inside of the arteries. Over time, the plaque may narrow or block the artery, and this will lessen blood flow to the heart. Plaque can also become weak and break off within a coronary artery to form a clot and cause a sudden blockage. °What increases the risk? °The risk factors of this condition include: °· High cholesterol levels. °· High blood pressure (hypertension). °· Smoking. °· Diabetes. °· Age. °· Family history of chest pain, heart disease, or stroke. °· Lack of exercise. °What are the signs or symptoms? °The most common signs of this condition include: °· Chest pain, which can be: °¨ A crushing or squeezing in the chest. °¨ A tightness, pressure, fullness, or heaviness in the chest. °¨ Present for more than a few minutes, or it can stop and recur. °· Pain in the arms, neck, jaw, or back. °· Unexplained heartburn or indigestion. °· Shortness of breath. °· Nausea. °· Sudden cold sweats. °· Feeling light-headed or dizzy. °Sometimes, this condition has no symptoms. °How is this diagnosed? °ACS may be diagnosed through the following tests: °· Electrocardiogram (ECG). °· Blood tests. °· Coronary angiogram. This is a procedure to look at the coronary arteries to see if there is any blockage. °How is this treated? °Treatment for ACS may include: °· Healthy behavioral changes to reduce or control risk factors. °· Medicine. °· Coronary stenting. A stent helps to keep an artery open. °· Coronary angioplasty. This procedure widens a narrowed or blocked  artery. °· Coronary artery bypass surgery. This will allow your blood to pass the blockage (bypass) to reach your heart. °Follow these instructions at home: °Eating and drinking °· Follow a heart-healthy diet. A dietitian can you help to educate you about healthy food options and changes. °· Use healthy cooking methods such as roasting, grilling, broiling, baking, poaching, steaming, or stir-frying. Talk to a dietitian to learn more about healthy cooking methods. °Medicines °· Take medicines only as directed by your health care provider. °· Do not take the following medicines unless your health care provider approves: °¨ Nonsteroidal anti-inflammatory drugs (NSAIDs), such as ibuprofen, naproxen, or celecoxib. °¨ Vitamin supplements that contain vitamin A, vitamin E, or both. °¨ Hormone replacement therapy that contains estrogen with or without progestin. °· Stop illegal drug use. °Activity °· Follow an exercise program that is approved by your health care provider. °· Plan rest periods when you are fatigued. °Lifestyle °· Do not use any tobacco products, including cigarettes, chewing tobacco, or electronic cigarettes. If you need help quitting, ask your health care provider. °· If you drink alcohol, and your health care provider approves, limit your alcohol intake to no more than 1 drink per day. One drink equals 12 ounces of beer, 5 ounces of wine, or 1½ ounces of hard liquor. °· Learn to manage stress. °· Maintain a healthy weight. Lose weight as approved by your health care provider. °General instructions °· Manage other health conditions, such as hypertension and diabetes, as directed by your health care provider. °· Keep all follow-up visits as directed by your   health care provider. This is important. °· Your health care provider may ask you to monitor your blood pressure. A blood pressure reading consists of a higher number over a lower number, such as 110 over 72, written as 110/72. Ideally, your blood  pressure should be: °¨ Below 140/90 if you have no other medical conditions. °¨ Below 130/80 if you have diabetes or kidney disease. °Get help right away if: °· You have pain in your chest, neck, arm, jaw, stomach, or back that lasts more than a few minutes, is recurring, or is not relieved by taking medicine under your tongue (sublingual nitroglycerin). °· You have profuse sweating without cause. °· You have unexplained: °¨ Heartburn or indigestion. °¨ Shortness of breath or difficulty breathing. °¨ Nausea or vomiting. °¨ Fatigue. °¨ Feelings of nervousness or anxiety. °¨ Weakness. °¨ Diarrhea. °· You have sudden light-headedness or dizziness. °· You faint. °These symptoms may represent a serious problem that is an emergency. Do not wait to see if the symptoms will go away. Get medical help right away. Call your local emergency services (911 in the [place] Do not drive yourself to the clinic or hospital.  °This information is not intended to replace advice given to you by your health care provider. Make sure you discuss any questions you have with your health care provider. °Document Released: 04/29/2005 Document Revised: 10/11/2015 Document Reviewed: 08/31/2013 °Elsevier Interactive Patient Education © 2017 [employer] ° °

## 2016-09-28 NOTE — Discharge Summary (Signed)
Physician Discharge Summary  Patient ID: [name] MRN: [id number] DOB/AGE: July 25, 1940 76 y.o.  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  Significant Diagnostic Studies: EKG 09/27/2016: Normal EKG, normal sinus rhythm, unchanged from previous tracings. Early transition of R-wave in the anterior leads, cannot exclude true posterior infarct. No significant change from yesterday.  ECHO:09/27/2016: Normal LV systolic function without wall motion of normality, no significant valvular abnormality.  Coronary angiogram 09/26/2016: Normal LVEDP. Ostial RCA 80% stenosis, mid and distal RCA 50-60% stenosis, small PDA 80% stenosis. Very large multiple PL branches, mild disease. Circumflex coronary artery mild disease. LAD diffusely diseased in the proximal segment and after the origin of D2, LAD is very small and tapers before reaching the apex. LAD and large D2 are subtotally occluded with TIMI 1 flow  PTCA and crush stenting of the bifurcating LAD & D1 with implantation of 2.0 x 26 mm resolute onyx distally and 3.0 x 26 mm resolute onyx proximally into the large dominant D1 extending into the mid LAD crushing the mid LAD stent distal to the D1 origin, a 2.5 x 18 mm resolute onyx. Proximal LAD stented with an additional 3.0 x 15 mm resolute onyx stent. Stenosis in the LAD and diagonal 1 reduced from subtotal occlusion to 0% with improvement in TIMI 1 flow to TIMI-3 flow at the end of the procedure.  Hospital Course: Patient admitted with non-ST elevation myocardial infarctions with chest pain, on admission and patient was asymptomatic and hence was admitted to the  intensive care unit and was hydrated due to chronic renal insufficiency, underwent coronary angiography the following morning.  He did develop a small hematoma on his right radial arterial access site without any hemodynamic consequences. Vascular surgery was consulted to be on the standby.  He recovered well, after 2 days, he had not had any chest pain or shortness of breath, serum creatinine remained stable, hence felt stable for discharge with outpatient management.  Patient is a high-grade stenosis in the ostium of the RCA which is a very dominant vessel, he will need angioplasty and elective fashion.  Discharge Exam: Blood pressure 138/61, pulse 62, temperature 98.2 F (36.8 C), temperature source Oral, resp. rate 20, height 6' (1.829 m), weight 86 kg (189 lb 9.5 oz), SpO2 95 %.  General appearance: alert, appears stated age and no distress Eyes: negative findings: lids and lashes normal Neck: no adenopathy, no carotid bruit, no JVD, supple, symmetrical, trachea midline and thyroid not enlarged, symmetric, no tenderness/mass/nodules Neck: JVP - normal, carotids 2+= without bruits Resp: clear to auscultation bilaterally Chest wall: no tenderness Cardio: regular rate and rhythm, S1, S2 normal, no murmur, click, rub or gallop GI: soft, non-tender; bowel sounds normal; no masses,  no organomegaly Extremities: extremities normal, atraumatic, no cyanosis or edema right upper extremity shows ecchymosis in the forearm, excellent radial pulse. Vascular exam is normal  Labs:   Lab Results  Component Value Date   WBC 9.5 09/28/2016   HGB 11.6 (L) 09/28/2016   HCT 35.6 (L) 09/28/2016   MCV 89.4 09/28/2016   PLT 152 09/28/2016    Recent Labs Lab 09/27/16 0300  NA 138  K 3.7  CL 106  CO2 24  BUN 22*  CREATININE 1.70*  CALCIUM 8.0*  GLUCOSE 135*    Lipid Panel     Component Value Date/Time   CHOL 199 09/25/2016 1243   TRIG 233 (H) 09/25/2016 1243   HDL 41 09/25/2016 1243    CHOLHDL 4.9 09/25/2016 1243   VLDL 47 (H) 09/25/2016 1243   LDLCALC 111 (H) 09/25/2016 1243    HEMOGLOBIN A1C Lab Results  Component Value Date   HGBA1C 7.5 (H) 09/25/2016   MPG 169 09/25/2016    Cardiac Panel (last 3 results)  Recent Labs  09/25/16 1118 09/25/16 2008 09/26/16 0136  TROPONINI 11.44* 11.10* 11.98*    Recent Labs  09/25/16 1218  TSH 0.733   Radiology: Dg Chest 2 View  Result Date: 09/25/2016 CLINICAL DATA:  Mid chest pain radiating to the back increasing since Monday. Patient reports shortness of breath with increased pain episodes. EXAM: CHEST  2 VIEW COMPARISON:  None in PACs FINDINGS: The lungs are reasonably well inflated. There is no focal infiltrate. There is no pleural effusion. The heart is normal in size. The pulmonary vascularity is normal. There is calcification in the wall of the aortic arch. The mediastinum is normal in width. There is mild multilevel degenerative disc disease of the thoracic spine. IMPRESSION: Top-normal cardiac size.  No pulmonary edema or pneumonia. Thoracic aortic atherosclerosis. Electronically Signed   By: David  Martinique M.D.   On: 09/25/2016 11:36   FOLLOW UP PLANS AND APPOINTMENTS Discharge Instructions    Amb Referral to Cardiac Rehabilitation    Complete by:  As directed    Diagnosis:   Coronary Stents NSTEMI       Allergies as of 09/28/2016      Reactions   Sulfa Antibiotics Hives      Medication List    TAKE these medications   aspirin EC 81 MG tablet Take 81 mg by mouth daily.   atorvastatin 80 MG tablet Commonly known as:  LIPITOR Take 1 tablet (80 mg total) by mouth daily at 6 PM.   BYSTOLIC 20 MG Tabs Generic drug:  Nebivolol HCl Take 20 mg by mouth daily. daily   glyBURIDE 2.5 MG tablet Commonly known as:  DIABETA Take 1 tablet (2.5 mg total) by mouth daily with breakfast.   isosorbide mononitrate 60 MG 24 hr tablet Commonly known as:  IMDUR Take 1 tablet (60 mg total) by mouth daily. Start  taking on:  09/29/2016   nitroGLYCERIN 0.4 MG SL tablet Commonly known as:  NITROSTAT Place 1 tablet (0.4 mg total) under the tongue every 5 (five) minutes x 3 doses as needed for chest pain.   ticagrelor 90 MG Tabs tablet Commonly known as:  BRILINTA Take 1 tablet (90 mg total) by mouth 2 (two) times daily.      Follow-up Information    Burnard Bunting, MD. Call in 2 day(s).   Specialty:  Internal Medicine Why:  To be seen in 1 week for diabetes management Contact information: [place] [address] [phone]        Adrian Prows, MD. Call in 2 day(s).   Specialty:  Cardiology Why:  To be seen in 2 weeks Contact information: [address] [place] [address] [phone]          Adrian Prows, MD 09/28/2016, 10:55 AM  Pager: [phone] Office: [phone] If no answer: [phone]

## 2016-09-30 ENCOUNTER — Telehealth (HOSPITAL_COMMUNITY): Payer: Self-pay

## 2016-09-30 NOTE — Telephone Encounter (Signed)
Patient insurance is active and benefits verified. Patient insurance is [employer] medicare - $20.00 co-payment, no deductible, out of pocket $4,400/$4,400 has been met, no co-insurance, no pre-authorization and no limit on visit. Passport/reference [phone].

## 2016-10-20 DIAGNOSIS — I2510 Atherosclerotic heart disease of native coronary artery without angina pectoris: Secondary | ICD-10-CM | POA: Diagnosis present

## 2016-10-20 DIAGNOSIS — I25118 Atherosclerotic heart disease of native coronary artery with other forms of angina pectoris: Secondary | ICD-10-CM | POA: Diagnosis present

## 2016-10-20 NOTE — H&P (Signed)
OFFICE VISIT NOTES COPIED TO EPIC FOR DOCUMENTATION  . History of Present Illness Gwinda Maine FNP-C; 10/10/2016 5:20 PM) Patient words: NP hospital follow up cath.  The patient is a 76 year old male who presents for a Follow-up for Coronary artery disease. Patient presents for hospital follow up. Patient was admitted to [facility] with non-ST elevation myocardial infarction on 09/25/2016 with chest pain, on admission and patient was asymptomatic and hence was admitted to the intensive care unit and was hydrated due to chronic renal insufficiency, underwent coronary angiography the following morning. He did develop a small hematoma on his right radial arterial access site without any hemodynamic consequences. Vascular surgery was consulted to be on the standby. He recovered well, after 2 days, he had not had any chest pain or shortness of breath, serum creatinine remained stable, hence felt stable for discharge with outpatient management. Patient is a high-grade stenosis in the ostium of the RCA which is a very dominant vessel, he will need angioplasty and elective fashion.  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  Since discharge patient has recovered well. Denies any chest pain. He does continue to have shortness of breath with exertion. No other complaints today. Tolerating medications well.   Problem List/Past Medical (April Harrington; 10/10/2016 2:04 PM) Leukemia  Benign essential hypertension (I10)  Hyperlipidemia, group A (E78.00)  Controlled type 2 diabetes mellitus without complication, without long-term current use of insulin (E11.9)   Allergies (April Harrington; 10/10/2016 1:56 PM) Sulfabenzamide *CHEMICALS*  Hives.  Family History (April Harrington; 10/10/2016 2:00 PM) Brother 1   3 yrs younger, no heart issues Mother  Deceased. at age 70, DM, no heart issues Father  Deceased. at age 21 from leukemia, hx of 2 stents 5 yrs before passing  Social History (April Harrington; 10/10/2016 1:58 PM) Current tobacco use  Never smoker. Alcohol Use  Non Drinker/No Alcohol Use  Marital status  Married. Living Situation  Lives with spouse. Number of Children  3.  Past Surgical History (April Harrington; 10/10/2016 2:03 PM) Arthroscopic Knee Surgery - Left [04/2015]: left knee reconstruction Kidney Removal - Right [1984]: Prostate Surgery [2009]:  Medication History (April Harrington; 10/10/2016 2:08 PM) Aspirin (81MG Tablet, 1 Oral daily) Active. Bystolic (38VA Tablet, 1 Oral daily) Active. Lisinopril-Hydrochlorothiazide (20-12.5MG Tablet, 1 Oral daily) Active. Atorvastatin Calcium (80MG Tablet, 1 Oral daily) Active. Glimepiride (4MG Tablet, 1 Oral daily) Active. Nitroglycerin (0.4MG Tab Sublingual, Sublingual prn) Active. Isosorbide Mononitrate ER (60MG Tablet ER 24HR, 1 Oral daily) Active. Brilinta (60MG Tablet, 1 Oral two times daily) Active. Medications Reconciled (verbally with pt; no list or medication present)  Diagnostic Studies History Laverda Page, MD; 10/10/2016 3:02 PM) Coronary Angiogram  Coronary angiogram high 17 2018: Ostial RCA 80%, mid 60%, small PDA 80%. Circumflex mild disease. LAD and large D2 subtotally occluded S/P crush stenting of the bifurcating LAD & D1 with implantation of 2.0 x 26 mm resolute onyx distally and 3.0 x 26 mm resolute onyx proximally into the large D1 extending into the mid LAD crushing the mid LAD stent distal to the D1 origin, a 2.5 x 18 mm resolute onyx. Proximal LAD stented with an additional 3.0 x 15 mm resolute onyx stent. Echocardiogram     Review of Systems Gwinda Maine FNP-C; 10/10/2016 5:23 PM) General Not Present- Appetite Loss and Weight Gain. Respiratory Not  Present- Chronic Cough and  Wakes up from Sleep Wheezing or Short of Breath. Cardiovascular Present- Difficulty Breathing On Exertion. Not Present- Chest Pain, Difficulty Breathing Lying Down and Edema. Gastrointestinal Not Present- Black, Tarry Stool and Difficulty Swallowing. Musculoskeletal Not Present- Decreased Range of Motion and Muscle Atrophy. Neurological Not Present- Attention Deficit. Psychiatric Not Present- Personality Changes and Suicidal Ideation. Endocrine Not Present- Cold Intolerance and Heat Intolerance. Hematology Not Present- Abnormal Bleeding. All other systems negative  Vitals (April Harrington; 10/10/2016 2:09 PM) 10/10/2016 1:54 PM Weight: 186.5 lb Height: 72in Body Surface Area: 2.07 m Body Mass Index: 25.29 kg/m  Pulse: 50 (Regular)  P.OX: 98% (Room air) BP: 98/62 (Sitting, Left Arm, Standard)       Physical Exam Gwinda Maine FNP-C; 10/10/2016 2:48 PM) General Mental Status-Alert. General Appearance-Cooperative and Appears stated age. Build & Nutrition-Moderately built.  Head and Neck Thyroid Gland Characteristics - normal size and consistency and no palpable nodules.  Chest and Lung Exam Chest and lung exam reveals -quiet, even and easy respiratory effort with no use of accessory muscles, non-tender and on auscultation, normal breath sounds, no adventitious sounds.  Cardiovascular Cardiovascular examination reveals -normal heart sounds, regular rate and rhythm with no murmurs, carotid auscultation reveals no bruits, abdominal aorta auscultation reveals no bruits and no prominent pulsation, femoral artery auscultation bilaterally reveals normal pulses, no bruits, no thrills and normal pedal pulses bilaterally.  Abdomen Palpation/Percussion Palpation and Percussion of the abdomen reveal - Non Tender and No hepatosplenomegaly.  Peripheral Vascular Lower Extremity Inspection - Right - Note: right radial access site healing well. No hematoma or evidence  of infection. Ecchymosis and slight tenderness is present.  Neurologic Neurologic evaluation reveals -alert and oriented x 3 with no impairment of recent or remote memory. Motor-Grossly intact without any focal deficits.  Musculoskeletal Global Assessment Left Lower Extremity - no deformities, masses or tenderness, no known fractures. Right Lower Extremity - no deformities, masses or tenderness, no known fractures.    Assessment & Plan Gwinda Maine FNP-C; 10/10/2016 5:23 PM) Atherosclerosis of native coronary artery of native heart with angina pectoris (I25.119) Impression: EKG 10/10/2016: Marked sinus bradycardia at the rate of 48 bpm with borderline first-degree AV block, normal axis, T-wave inversion anteriorly, cannot exclude ischemia. Normal QT interval. Current Plans Complete electrocardiogram (61443) METABOLIC PANEL, BASIC (15400) CBC & PLATELETS (AUTO) (86761) PT (PROTHROMBIN TIME) (95093) S/P PTCA (percutaneous transluminal coronary angioplasty) (Z98.61) Story: Coronary angiogram 09/26/2016: Ostial RCA 80%, mid 60%, small PDA 80%. Circumflex mild disease. LAD and large D2 subtotally occluded S/P crush stenting of the bifurcating LAD & D1 with implantation of 2.0 x 26 mm resolute onyx distally and 3.0 x 26 mm resolute onyx proximally into the large D1 extending into the mid LAD crushing the mid LAD stent distal to the D1 origin, a 2.5 x 18 mm resolute onyx. Proximal LAD stented with an additional 3.0 x 15 mm resolute onyx stent. History of non-ST elevation myocardial infarction (NSTEMI) (I25.2) Laboratory examination (O67.12) Story: 09/28/2016: RBC 3.98, hemoglobin 11.6, hematocrit 35.6, CBC otherwise normal. Potassium 3.7, creatinine 1.7, EGFR 37, BMP otherwise normal. Hemoglobin A1c 7.5%. Cholesterol 199, triglycerides 233, HDL 41, LDL 111. Benign essential hypertension (I10) Hyperlipidemia, group A (E78.00) Controlled type 2 diabetes mellitus without complication, without  long-term current use of insulin (E11.9) Stage 3 chronic kidney disease (N18.3) Story: history of nephrectomy in 1984 Current Plans Mechanism of underlying disease process and action of medications discussed with the patient. I discussed primary/secondary prevention and  also dietary counceling was done. Patient presents for has And hospital follow-up. Patient is presently doing well, he's had no recurrence of angina pectoris. He does continue to have shortness of breath on exertion. He underwent stenting to his LAD and large D2, he continues to have high-grade stenosis in the ostium of the RCA that we'll need angioplasty in the future. Right radial access site is healing well, no evidence of infection or hematoma. He is tolerating medications well, blood pressure is soft today. He is also noted to be slightly bradycardic. Will decrease Bystolic to 10 mg daily. As patient has recovered well, feel that we should proceed with coronary angiogram for stenting of RCA. Schedule for cardiac catheterization, and possible angioplasty. We discussed regarding risks, benefits, alternatives to this including stress testing, CTA and continued medical therapy. Patient wants to proceed. Understands <1-2% risk of death, stroke, MI, urgent CABG, bleeding, infection, renal failure but not limited to these.  Patient reports diabetes is well controlled with current medications and recently saw Dr. Reynaldo Minium for evaluation. We'll have him follow up after procedure for further recommendations and evaluation.  *I have discussed this case with Dr. Einar Gip and he personally examined the patient and participated in formulating the plan.*  CC: Dr. Burnard Bunting  Signed electronically by Gwinda Maine, FNP-C (10/10/2016 5:24 PM)

## 2016-10-22 ENCOUNTER — Ambulatory Visit (HOSPITAL_COMMUNITY)
Admission: RE | Admit: 2016-10-22 | Discharge: 2016-10-23 | Disposition: A | Discharge status: Home / Self Care | Payer: Medicare Other | Source: Ambulatory Visit | Attending: Cardiology | Admitting: Cardiology

## 2016-10-22 ENCOUNTER — Encounter (HOSPITAL_COMMUNITY): Payer: Self-pay | Admitting: General Practice

## 2016-10-22 ENCOUNTER — Encounter (HOSPITAL_COMMUNITY)
Admission: RE | Disposition: A | Discharge status: Home / Self Care | Payer: Self-pay | Source: Ambulatory Visit | Attending: Cardiology

## 2016-10-22 DIAGNOSIS — I252 Old myocardial infarction: Secondary | ICD-10-CM | POA: Insufficient documentation

## 2016-10-22 DIAGNOSIS — N183 Chronic kidney disease, stage 3 (moderate): Secondary | ICD-10-CM | POA: Insufficient documentation

## 2016-10-22 DIAGNOSIS — Z7902 Long term (current) use of antithrombotics/antiplatelets: Secondary | ICD-10-CM | POA: Diagnosis not present

## 2016-10-22 DIAGNOSIS — Z7982 Long term (current) use of aspirin: Secondary | ICD-10-CM | POA: Insufficient documentation

## 2016-10-22 DIAGNOSIS — Z955 Presence of coronary angioplasty implant and graft: Secondary | ICD-10-CM

## 2016-10-22 DIAGNOSIS — Z905 Acquired absence of kidney: Secondary | ICD-10-CM | POA: Diagnosis not present

## 2016-10-22 DIAGNOSIS — E785 Hyperlipidemia, unspecified: Secondary | ICD-10-CM | POA: Insufficient documentation

## 2016-10-22 DIAGNOSIS — Z856 Personal history of leukemia: Secondary | ICD-10-CM | POA: Insufficient documentation

## 2016-10-22 DIAGNOSIS — I25118 Atherosclerotic heart disease of native coronary artery with other forms of angina pectoris: Secondary | ICD-10-CM | POA: Diagnosis present

## 2016-10-22 DIAGNOSIS — E1122 Type 2 diabetes mellitus with diabetic chronic kidney disease: Secondary | ICD-10-CM | POA: Diagnosis not present

## 2016-10-22 DIAGNOSIS — Z9861 Coronary angioplasty status: Secondary | ICD-10-CM

## 2016-10-22 DIAGNOSIS — I129 Hypertensive chronic kidney disease with stage 1 through stage 4 chronic kidney disease, or unspecified chronic kidney disease: Secondary | ICD-10-CM | POA: Insufficient documentation

## 2016-10-22 DIAGNOSIS — I2510 Atherosclerotic heart disease of native coronary artery without angina pectoris: Secondary | ICD-10-CM | POA: Diagnosis present

## 2016-10-22 HISTORY — DX: Non-ST elevation (NSTEMI) myocardial infarction: I21.4

## 2016-10-22 HISTORY — DX: Personal history of urinary calculi: Z87.442

## 2016-10-22 HISTORY — DX: Multiple myeloma not having achieved remission: C90.00

## 2016-10-22 HISTORY — PX: CORONARY STENT INTERVENTION: CATH118234

## 2016-10-22 HISTORY — DX: Unspecified osteoarthritis, unspecified site: M19.90

## 2016-10-22 HISTORY — DX: Personal history of other diseases of the musculoskeletal system and connective tissue: Z87.39

## 2016-10-22 LAB — BASIC METABOLIC PANEL
Anion gap: 9 (ref 5–15)
BUN: 28 mg/dL — AB (ref 6–20)
CALCIUM: 9.2 mg/dL (ref 8.9–10.3)
CO2: 23 mmol/L (ref 22–32)
Chloride: 108 mmol/L (ref 101–111)
Creatinine, Ser: 1.88 mg/dL — ABNORMAL HIGH (ref 0.61–1.24)
GFR calc Af Amer: 38 mL/min — ABNORMAL LOW (ref >60)
GFR, EST NON AFRICAN AMERICAN: 33 mL/min — AB (ref >60)
Glucose, Bld: 104 mg/dL — ABNORMAL HIGH (ref 65–99)
POTASSIUM: 4.2 mmol/L (ref 3.5–5.1)
SODIUM: 140 mmol/L (ref 135–145)

## 2016-10-22 LAB — GLUCOSE, CAPILLARY
GLUCOSE-CAPILLARY: 83 mg/dL (ref 65–99)
GLUCOSE-CAPILLARY: 85 mg/dL (ref 65–99)
GLUCOSE-CAPILLARY: 180 mg/dL — AB (ref 65–99)
Glucose-Capillary: 144 mg/dL — ABNORMAL HIGH (ref 65–99)

## 2016-10-22 LAB — POCT ACTIVATED CLOTTING TIME
ACTIVATED CLOTTING TIME: 422 s
Activated Clotting Time: 257 seconds
Activated Clotting Time: 219 seconds

## 2016-10-22 SURGERY — CORONARY STENT INTERVENTION
Anesthesia: LOCAL

## 2016-10-22 MED ORDER — SODIUM CHLORIDE 0.9% FLUSH
3.0000 mL | Freq: Two times a day (BID) | INTRAVENOUS | Status: DC
Start: 2016-10-22 — End: 2016-10-23

## 2016-10-22 MED ORDER — LIDOCAINE HCL (PF) 1 % IJ SOLN
INTRAMUSCULAR | Status: DC | PRN
  Administered 2016-10-22: 2 mL

## 2016-10-22 MED ORDER — FENTANYL CITRATE (PF) 100 MCG/2ML IJ SOLN
INTRAMUSCULAR | Status: AC
  Filled 2016-10-22: qty 2

## 2016-10-22 MED ORDER — SODIUM CHLORIDE 0.9 % WEIGHT BASED INFUSION
3.0000 mL/kg/h | INTRAVENOUS | Status: DC
  Administered 2016-10-22: 3 mL/kg/h via INTRAVENOUS

## 2016-10-22 MED ORDER — SODIUM CHLORIDE 0.9% FLUSH: 3.0000 mL | INTRAVENOUS | Status: DC | PRN

## 2016-10-22 MED ORDER — LIDOCAINE HCL 1 % IJ SOLN
INTRAMUSCULAR | Status: AC
  Filled 2016-10-22: qty 20

## 2016-10-22 MED ORDER — HYDRALAZINE HCL 20 MG/ML IJ SOLN: 5.0000 mg | INTRAMUSCULAR | Status: AC | PRN

## 2016-10-22 MED ORDER — IOPAMIDOL (ISOVUE-370) INJECTION 76%
INTRAVENOUS | Status: AC
  Filled 2016-10-22: qty 100

## 2016-10-22 MED ORDER — NITROGLYCERIN 0.4 MG SL SUBL: 0.4000 mg | SUBLINGUAL_TABLET | SUBLINGUAL | Status: DC | PRN

## 2016-10-22 MED ORDER — TICAGRELOR 90 MG PO TABS
90.0000 mg | ORAL_TABLET | Freq: Two times a day (BID) | ORAL | Status: DC
  Administered 2016-10-22 – 2016-10-23 (×2): 90 mg via ORAL
  Filled 2016-10-22 (×2): qty 1

## 2016-10-22 MED ORDER — ATORVASTATIN CALCIUM 80 MG PO TABS
80.0000 mg | ORAL_TABLET | Freq: Every day | ORAL | Status: DC
  Administered 2016-10-22: 80 mg via ORAL
  Filled 2016-10-22: qty 1

## 2016-10-22 MED ORDER — ONDANSETRON HCL 4 MG/2ML IJ SOLN: 4.0000 mg | Freq: Four times a day (QID) | INTRAMUSCULAR | Status: DC | PRN

## 2016-10-22 MED ORDER — NITROGLYCERIN 1 MG/10 ML FOR IR/CATH LAB
INTRA_ARTERIAL | Status: AC
  Filled 2016-10-22: qty 10

## 2016-10-22 MED ORDER — NITROGLYCERIN 1 MG/10 ML FOR IR/CATH LAB
INTRA_ARTERIAL | Status: DC | PRN
  Administered 2016-10-22: 200 ug via INTRACORONARY
  Administered 2016-10-22: 150 ug via INTRACORONARY

## 2016-10-22 MED ORDER — MIDAZOLAM HCL 2 MG/2ML IJ SOLN
INTRAMUSCULAR | Status: DC | PRN
  Administered 2016-10-22: 2 mg via INTRAVENOUS

## 2016-10-22 MED ORDER — VERAPAMIL HCL 2.5 MG/ML IV SOLN
INTRAVENOUS | Status: AC
  Filled 2016-10-22: qty 2

## 2016-10-22 MED ORDER — SODIUM CHLORIDE 0.9% FLUSH: 3.0000 mL | Freq: Two times a day (BID) | INTRAVENOUS | Status: DC

## 2016-10-22 MED ORDER — INSULIN ASPART 100 UNIT/ML ~~LOC~~ SOLN
0.0000 [IU] | Freq: Three times a day (TID) | SUBCUTANEOUS | Status: DC
  Administered 2016-10-22: 18:00:00 3 [IU] via SUBCUTANEOUS
  Administered 2016-10-23: 07:00:00 2 [IU] via SUBCUTANEOUS

## 2016-10-22 MED ORDER — ASPIRIN 81 MG PO CHEW: 81.0000 mg | CHEWABLE_TABLET | ORAL | Status: DC

## 2016-10-22 MED ORDER — SODIUM CHLORIDE 0.9 % WEIGHT BASED INFUSION
1.0000 mL/kg/h | INTRAVENOUS | Status: AC
  Administered 2016-10-22: 18:00:00 1 mL/kg/h via INTRAVENOUS

## 2016-10-22 MED ORDER — HEPARIN SODIUM (PORCINE) 1000 UNIT/ML IJ SOLN
INTRAMUSCULAR | Status: AC
  Filled 2016-10-22: qty 1

## 2016-10-22 MED ORDER — LIDOCAINE HCL (PF) 1 % IJ SOLN
INTRAMUSCULAR | Status: DC | PRN
  Administered 2016-10-22: 7 mL via INTRA_ARTERIAL

## 2016-10-22 MED ORDER — NEBIVOLOL HCL 20 MG PO TABS: 10.0000 mg | ORAL_TABLET | Freq: Every day | ORAL | Status: DC

## 2016-10-22 MED ORDER — SODIUM CHLORIDE 0.9 % IV SOLN: 250.0000 mL | INTRAVENOUS | Status: DC | PRN

## 2016-10-22 MED ORDER — FENTANYL CITRATE (PF) 100 MCG/2ML IJ SOLN
INTRAMUSCULAR | Status: DC | PRN
  Administered 2016-10-22: 25 ug via INTRAVENOUS

## 2016-10-22 MED ORDER — ASPIRIN EC 81 MG PO TBEC
81.0000 mg | DELAYED_RELEASE_TABLET | Freq: Every day | ORAL | Status: DC
  Administered 2016-10-23: 10:00:00 81 mg via ORAL
  Filled 2016-10-22: qty 1

## 2016-10-22 MED ORDER — HEPARIN SODIUM (PORCINE) 1000 UNIT/ML IJ SOLN
INTRAMUSCULAR | Status: DC | PRN
  Administered 2016-10-22: 2500 [IU] via INTRAVENOUS
  Administered 2016-10-22: 8000 [IU] via INTRAVENOUS

## 2016-10-22 MED ORDER — TETRAHYDROZOLINE HCL 0.05 % OP SOLN: 1.0000 [drp] | Freq: Every day | OPHTHALMIC | Status: DC | PRN

## 2016-10-22 MED ORDER — NEBIVOLOL HCL 10 MG PO TABS
10.0000 mg | ORAL_TABLET | Freq: Every day | ORAL | Status: DC
  Administered 2016-10-23: 10:00:00 10 mg via ORAL
  Filled 2016-10-22: qty 1

## 2016-10-22 MED ORDER — MIDAZOLAM HCL 2 MG/2ML IJ SOLN
INTRAMUSCULAR | Status: AC
  Filled 2016-10-22: qty 2

## 2016-10-22 MED ORDER — LABETALOL HCL 5 MG/ML IV SOLN: 10.0000 mg | INTRAVENOUS | Status: AC | PRN

## 2016-10-22 MED ORDER — IOPAMIDOL (ISOVUE-370) INJECTION 76%
INTRAVENOUS | Status: DC | PRN
  Administered 2016-10-22: 120 mL via INTRA_ARTERIAL

## 2016-10-22 MED ORDER — ACETAMINOPHEN 500 MG PO TABS: 1000.0000 mg | ORAL_TABLET | Freq: Four times a day (QID) | ORAL | Status: DC | PRN

## 2016-10-22 MED ORDER — ANGIOPLASTY BOOK
Freq: Once | Status: AC
  Administered 2016-10-22: 21:00:00
  Filled 2016-10-22: qty 1

## 2016-10-22 MED ORDER — SODIUM CHLORIDE 0.9 % WEIGHT BASED INFUSION
1.0000 mL/kg/h | INTRAVENOUS | Status: DC
  Administered 2016-10-22: 1 mL/kg/h via INTRAVENOUS

## 2016-10-22 MED ORDER — HEPARIN (PORCINE) IN NACL 2-0.9 UNIT/ML-% IJ SOLN
INTRAMUSCULAR | Status: AC | PRN
  Administered 2016-10-22: 1000 mL

## 2016-10-22 MED ORDER — ISOSORBIDE MONONITRATE ER 60 MG PO TB24
60.0000 mg | ORAL_TABLET | Freq: Every day | ORAL | Status: DC
  Administered 2016-10-23: 60 mg via ORAL
  Filled 2016-10-22: qty 1

## 2016-10-22 SURGICAL SUPPLY — 19 items
BALLN WOLVERINE 3.00X10 (BALLOONS) ×2
BALLN ~~LOC~~ EMERGE MR 3.25X8 (BALLOONS) ×2
BALLOON WOLVERINE 3.00X10 (BALLOONS) ×1 IMPLANT
BALLOON ~~LOC~~ EMERGE MR 3.25X8 (BALLOONS) ×1 IMPLANT
CATH VISTA GUIDE 6FR JR4 SH (CATHETERS) ×2 IMPLANT
DEVICE RAD COMP TR BAND LRG (VASCULAR PRODUCTS) ×2 IMPLANT
GLIDESHEATH SLEND A-KIT 6F 20G (SHEATH) ×2 IMPLANT
GUIDEWIRE INQWIRE 1.5J.035X260 (WIRE) ×1 IMPLANT
INQWIRE 1.5J .035X260CM (WIRE) ×2
KIT ENCORE 26 ADVANTAGE (KITS) ×2 IMPLANT
KIT HEART LEFT (KITS) ×2 IMPLANT
LUBRICANT VIPERSLIDE CORONARY (MISCELLANEOUS) ×2 IMPLANT
PACK CARDIAC CATHETERIZATION (CUSTOM PROCEDURE TRAY) ×2 IMPLANT
STENT PROMUS PREM MR 3.0X16 (Permanent Stent) ×2 IMPLANT
STENT PROMUS PREM MR 4.0X16 (Permanent Stent) ×2 IMPLANT
TRANSDUCER W/STOPCOCK (MISCELLANEOUS) ×2 IMPLANT
TUBING CIL FLEX 10 FLL-RA (TUBING) ×2 IMPLANT
WIRE RUNTHROUGH .014X300CM (WIRE) ×2 IMPLANT
WIRE VIPER ADVANCE COR .012TIP (WIRE) ×2 IMPLANT

## 2016-10-22 NOTE — Progress Notes (Signed)
9. S/W VERONICA @ [facility] # [phone]   1. BRILINTA 90 MG BID   COVER- YES  CO-PAY- $ 45.00  TIER- 3 DRUG  PRIOR APPROVAL- NO   PHARMACY : ANY RETAIL

## 2016-10-22 NOTE — Care Management Note (Signed)
Case Management Note  Patient Details  Name: [name] MRN: [id number] Date of Birth: 11-23-1940  Subjective/Objective:   From home with wife, s/p coronary stent intervention, will be on brilinta, NCM awaiting benefit check.  PCP  Burnard Bunting               Action/Plan: NCM will follow for dc needs.    Expected Discharge Date:                  Expected Discharge Plan:  Home/Self Care  In-House Referral:     Discharge planning Services  CM Consult  Post Acute Care Choice:    Choice offered to:     DME Arranged:    DME Agency:     HH Arranged:    HH Agency:     Status of Service:  In process, will continue to follow  If discussed at Long Length of Stay Meetings, dates discussed:    Additional Comments:  Zenon Mayo, RN 10/22/2016, 3:18 PM

## 2016-10-22 NOTE — Progress Notes (Signed)
TR BAND REMOVAL  LOCATION:    right radial  DEFLATED PER PROTOCOL:    Yes.    TIME BAND OFF / DRESSING APPLIED:    1545   SITE UPON ARRIVAL:    Level 0  SITE AFTER BAND REMOVAL:    Level 0  CIRCULATION SENSATION AND MOVEMENT:    Within Normal Limits   Yes.    COMMENTS:   TOLERATED PROCEDURE WELL

## 2016-10-22 NOTE — Interval H&P Note (Signed)
History and Physical Interval Note:  10/22/2016 9:51 AM  [name]  has presented today for surgery, with the diagnosis of cad  The various methods of treatment have been discussed with the patient and family. After consideration of risks, benefits and other options for treatment, the patient has consented to  Procedure(s): Coronary Stent Intervention (N/A) as a surgical intervention .  The patient's history has been reviewed, patient examined, no change in status, stable for surgery.  I have reviewed the patient's chart and labs.  Questions were answered to the patient's satisfaction.   Ischemic Symptoms? CCS III (Marked limitation of ordinary activity) Anti-ischemic Medical Therapy? Maximal Medical Therapy (2 or more classes of medications) Non-invasive Test Results? No non-invasive testing performed Prior CABG? No Previous CABG   Patient Information:   1-2V CAD, no prox LAD  A (7)  Indication: 20; Score: 7   Patient Information:   1-2V-CAD with DS 50-60% With No FFR, No IVUS  I (3)  Indication: 21; Score: 3   Patient Information:   1-2V-CAD with DS 50-60% With FFR  A (7)  Indication: 22; Score: 7   Patient Information:   1-2V-CAD with DS 50-60% With FFR>0.8, IVUS not significant  I (2)  Indication: 23; Score: 2   Patient Information:   3V-CAD without LMCA With Abnormal LV systolic function  A (9)  Indication: 48; Score: 9   Patient Information:   LMCA-CAD  A (9)  Indication: 49; Score: 9   Patient Information:   2V-CAD with prox LAD PCI  A (7)  Indication: 62; Score: 7   Patient Information:   2V-CAD with prox LAD CABG  A (8)  Indication: 62; Score: 8   Patient Information:   3V-CAD without LMCA With Low CAD burden(i.e., 3 focal stenoses, low SYNTAX score) PCI  A (7)  Indication: 63; Score: 7   Patient Information:   3V-CAD without LMCA With Low CAD burden(i.e., 3 focal stenoses, low SYNTAX score) CABG  A (9)   Indication: 63; Score: 9   Patient Information:   3V-CAD without LMCA E06c - Intermediate-high CAD burden (i.e., multiple diffuse lesions, presence of CTO, or high SYNTAX score) PCI  U (4)  Indication: 64; Score: 4   Patient Information:   3V-CAD without LMCA E06c - Intermediate-high CAD burden (i.e., multiple diffuse lesions, presence of CTO, or high SYNTAX score) CABG  A (9)  Indication: 64; Score: 9   Patient Information:   LMCA-CAD With Isolated LMCA stenosis  PCI  U (6)  Indication: 65; Score: 6   Patient Information:   LMCA-CAD With Isolated LMCA stenosis  CABG  A (9)  Indication: 65; Score: 9   Patient Information:   LMCA-CAD Additional CAD, low CAD burden (i.e., 1- to 2-vessel additional involvement, low SYNTAX score) PCI  U (5)  Indication: 66; Score: 5   Patient Information:   LMCA-CAD Additional CAD, low CAD burden (i.e., 1- to 2-vessel additional involvement, low SYNTAX score) CABG  A (9)  Indication: 66; Score: 9   Patient Information:   LMCA-CAD Additional CAD, intermediate-high CAD burden (i.e., 3-vessel involvement, presence of CTO, or high SYNTAX score) PCI  I (3)  Indication: 67; Score: 3   Patient Information:   LMCA-CAD Additional CAD, intermediate-high CAD burden (i.e., 3-vessel involvement, presence of CTO, or high SYNTAX score) CABG  A (9)  Indication: 67; Score: 9   [name]

## 2016-10-23 ENCOUNTER — Encounter (HOSPITAL_COMMUNITY): Payer: Self-pay | Admitting: Cardiology

## 2016-10-23 DIAGNOSIS — I252 Old myocardial infarction: Secondary | ICD-10-CM | POA: Diagnosis not present

## 2016-10-23 DIAGNOSIS — I25118 Atherosclerotic heart disease of native coronary artery with other forms of angina pectoris: Secondary | ICD-10-CM | POA: Diagnosis not present

## 2016-10-23 DIAGNOSIS — Z955 Presence of coronary angioplasty implant and graft: Secondary | ICD-10-CM | POA: Diagnosis not present

## 2016-10-23 DIAGNOSIS — E1122 Type 2 diabetes mellitus with diabetic chronic kidney disease: Secondary | ICD-10-CM | POA: Diagnosis not present

## 2016-10-23 LAB — CBC
HCT: 35.2 % — ABNORMAL LOW (ref 39.0–52.0)
HEMOGLOBIN: 11.3 g/dL — AB (ref 13.0–17.0)
MCH: 29 pg (ref 26.0–34.0)
MCHC: 32.1 g/dL (ref 30.0–36.0)
MCV: 90.3 fL (ref 78.0–100.0)
PLATELETS: 153 10*3/uL (ref 150–400)
RBC: 3.9 MIL/uL — ABNORMAL LOW (ref 4.22–5.81)
RDW: 13.4 % (ref 11.5–15.5)
WBC: 7 10*3/uL (ref 4.0–10.5)

## 2016-10-23 LAB — BASIC METABOLIC PANEL
Anion gap: 10 (ref 5–15)
BUN: 25 mg/dL — ABNORMAL HIGH (ref 6–20)
CALCIUM: 8.5 mg/dL — AB (ref 8.9–10.3)
CO2: 22 mmol/L (ref 22–32)
CREATININE: 1.59 mg/dL — AB (ref 0.61–1.24)
Chloride: 107 mmol/L (ref 101–111)
GFR calc Af Amer: 47 mL/min — ABNORMAL LOW (ref >60)
GFR calc non Af Amer: 40 mL/min — ABNORMAL LOW (ref >60)
GLUCOSE: 147 mg/dL — AB (ref 65–99)
Potassium: 4.5 mmol/L (ref 3.5–5.1)
Sodium: 139 mmol/L (ref 135–145)

## 2016-10-23 LAB — GLUCOSE, CAPILLARY: GLUCOSE-CAPILLARY: 132 mg/dL — AB (ref 65–99)

## 2016-10-23 NOTE — Progress Notes (Signed)
CARDIAC REHAB PHASE I   PRE:  Rate/Rhythm: 70 SR  BP:  Sitting: 140/64   MODE:  Ambulation: 400 ft   POST:  Rate/Rhythm: 76 SR  BP:  Sitting: 182/59       Pt last seen by cardiac rehab 09/2016. Pt ambulated 400 ft on RA, handheld assist, slow, mostly steady gait, tolerated well with no complaints. Completed PCI/stent education with pt and brother at bedside.  Reviewed risk factors, PCI book, anti-platelet therapy, stent card, activity restrictions, ntg, exercise, heart healthy and diabetes diet handouts, and phase 2 cardiac rehab. Pt verbalized understanding. Pt agrees to phase 2 cardiac rehab referral, will send updated referral to [facility] per pt request. Pt to recliner after walk, call bell within reach.     [id number] Lenna Sciara, RN, BSN 10/23/2016 9:02 AM

## 2016-10-23 NOTE — Progress Notes (Signed)
Pt and family (wife and brother) given all discharge instructions and questions answered.  Pt and family understands brilinta orders and where to pick it up.  Pt discharged via home with wife via wheelchair. No chest pain.

## 2016-10-23 NOTE — Discharge Summary (Signed)
Physician Discharge Summary  Patient ID: [name] MRN: [id number] DOB/AGE: 78-Mar-1942 78 y.o.  Admit date: 10/22/2016 Discharge date: 10/23/2016  Primary Discharge Diagnosis Coronary artery disease of native vessels with angina pectoris H/O NSTEMI 09/25/2016  Secondary Discharge Diagnosis Hypertension Hyperlipidemia Diabetes mellitus type 2 controlled with stage III chronic kidney disease Unilateral kidney, history of right nephrectomy remotely for malignancy.  Significant Diagnostic Studies:  Hospital Course: Patient with history of known coronary artery disease when he presented with non-ST elevation MI on 09/28/2016 and had undergone complex angioplasty to the subtotally occluded LAD and diagonal vessels. At the time he was also found to stenosis of the right coronary artery. Due to renal insufficiency, right coronary being non-culprit, he was scheduled for elective angioplasty as it was a superdominant right coronary artery.  Elective angiography on 10/22/2016, successful antiplastic and stenting to the right coronary artery, following morning felt stable for discharge. Serum creatinine remains stable.   Recommendations on discharge: Patient will be continued on dual antiplatelet therapy. He will need this at least one year. Aggressive medical therapy with control of hypertension, hyperlipidemia and diabetes.  Discharge Exam: Blood pressure (!) 156/63, pulse (!) 56, temperature 98.1 F (36.7 C), temperature source Oral, resp. rate 19, height 6' (1.829 m), weight 83 kg (182 lb 15.7 oz), SpO2 95 %.  General appearance: alert, appears stated age and no distress Eyes: negative findings: lids and lashes normal Neck: no adenopathy, no carotid bruit, no JVD, supple, symmetrical, trachea midline and thyroid not enlarged, symmetric, no tenderness/mass/nodules Neck: JVP - normal, carotids 2+= without bruits Resp: clear to auscultation bilaterally Chest wall: no tenderness Cardio:  regular rate and rhythm, S1, S2 normal, no murmur, click, rub or gallop GI: soft, non-tender; bowel sounds normal; no masses, no organomegaly Extremities: extremities normal, atraumatic, no cyanosis or edemaright upper extremity shows ecchymosis in the forearm, excellent radial pulse. Vascular exam is normal  Labs:   Lab Results  Component Value Date   WBC 7.0 10/23/2016   HGB 11.3 (L) 10/23/2016   HCT 35.2 (L) 10/23/2016   MCV 90.3 10/23/2016   PLT 153 10/23/2016    Recent Labs Lab 10/23/16 0308  NA 139  K 4.5  CL 107  CO2 22  BUN 25*  CREATININE 1.59*  CALCIUM 8.5*  GLUCOSE 147*    Lipid Panel     Component Value Date/Time   CHOL 199 09/25/2016 1243   TRIG 233 (H) 09/25/2016 1243   HDL 41 09/25/2016 1243   CHOLHDL 4.9 09/25/2016 1243   VLDL 47 (H) 09/25/2016 1243   LDLCALC 111 (H) 09/25/2016 1243    BNP (last 3 results) No results for input(s): BNP in the last 8760 hours.  HEMOGLOBIN A1C Lab Results  Component Value Date   HGBA1C 7.5 (H) 09/25/2016   MPG 169 09/25/2016    Cardiac Panel (last 3 results)  Recent Labs  09/25/16 1118 09/25/16 2008 09/26/16 0136  TROPONINI 11.44* 11.10* 11.98*    Lab Results  Component Value Date   TROPONINI 11.98 ([place]) 09/26/2016     TSH  Recent Labs  09/25/16 1218  TSH 0.733   EKG 10/23/2016: Sinus rhythm with borderline first-degree AV block at a rate of 61 bpm, normal axis, borderline criteria for LVH. No evidence of ischemia..   Radiology: Dg Chest 2 View  Result Date: 09/25/2016 CLINICAL DATA:  Mid chest pain radiating to the back increasing since Monday. Patient reports shortness of breath with increased pain episodes. EXAM: CHEST  2  VIEW COMPARISON:  None in PACs FINDINGS: The lungs are reasonably well inflated. There is no focal infiltrate. There is no pleural effusion. The heart is normal in size. The pulmonary vascularity is normal. There is calcification in the wall of the aortic arch. The  mediastinum is normal in width. There is mild multilevel degenerative disc disease of the thoracic spine. IMPRESSION: Top-normal cardiac size.  No pulmonary edema or pneumonia. Thoracic aortic atherosclerosis. Electronically Signed   By: David  Martinique M.D.   On: 09/25/2016 11:36   FOLLOW UP PLANS AND APPOINTMENTS Discharge Instructions    Amb Referral to Cardiac Rehabilitation    Complete by:  As directed    Diagnosis:  Coronary Stents     Allergies as of 10/23/2016      Reactions   Sulfa Antibiotics Hives      Medication List    TAKE these medications   acetaminophen 500 MG tablet Commonly known as:  TYLENOL Take 1,000 mg by mouth every 6 (six) hours as needed for moderate pain.   aspirin EC 81 MG tablet Take 81 mg by mouth daily.   atorvastatin 80 MG tablet Commonly known as:  LIPITOR Take 1 tablet (80 mg total) by mouth daily at 6 PM.   BYSTOLIC 20 MG Tabs Generic drug:  Nebivolol HCl Take 10 mg by mouth daily. daily   glyBURIDE 2.5 MG tablet Commonly known as:  DIABETA Take 1 tablet (2.5 mg total) by mouth daily with breakfast.   isosorbide mononitrate 60 MG 24 hr tablet Commonly known as:  IMDUR Take 1 tablet (60 mg total) by mouth daily.   lisinopril-hydrochlorothiazide 20-12.5 MG tablet Commonly known as:  PRINZIDE,ZESTORETIC Take 1 tablet by mouth daily.   nitroGLYCERIN 0.4 MG SL tablet Commonly known as:  NITROSTAT Place 1 tablet (0.4 mg total) under the tongue every 5 (five) minutes x 3 doses as needed for chest pain.   TART CHERRY ADVANCED PO Take 1 tablet by mouth daily.   ticagrelor 90 MG Tabs tablet Commonly known as:  BRILINTA Take 1 tablet (90 mg total) by mouth 2 (two) times daily.   VISINE OP Place 1 drop into both eyes daily as needed (dry eyes).      Follow-up Information    Adrian Prows, MD. Call.   Specialty:  Cardiology Why:  Keep previous appointment Contact information: [address] [place]  [address] [phone]          Adrian Prows, MD 10/23/2016, 10:14 AM  Pager: [phone] Office: [phone] If no answer: [phone]

## 2016-10-23 NOTE — Care Management Note (Signed)
Case Management Note  Patient Details  Name: [name] MRN: [id number] Date of Birth: [birth year]  Subjective/Objective:   From home with wife, pta indep, s/p coronary stent intervention will be on brilinta, he has been on brilinta, he goes to [facility].  He is for discharge today, no other needs.   PCP   Burnard Bunting               Action/Plan:   Expected Discharge Date:                  Expected Discharge Plan:  Home/Self Care  In-House Referral:     Discharge planning Services  CM Consult  Post Acute Care Choice:    Choice offered to:     DME Arranged:    DME Agency:     HH Arranged:    [place] Agency:     Status of Service:  Completed, signed off  If discussed at [place] of Stay Meetings, dates discussed:    Additional Comments:  Zenon Mayo, RN 10/23/2016, 8:44 AM

## 2016-11-01 ENCOUNTER — Telehealth (HOSPITAL_COMMUNITY): Payer: Self-pay

## 2016-11-01 NOTE — Telephone Encounter (Signed)
I called and left message on patient voicemail to call office about scheduling for cardiac rehab. I left office contact information on patient voicemail to return call.  ° °

## 2016-11-12 ENCOUNTER — Telehealth (HOSPITAL_COMMUNITY): Payer: Self-pay

## 2016-11-12 NOTE — Telephone Encounter (Addendum)
*  Updated insurance* [employer] Medicare - $ 20.00 co-payment, no deductible, out of pocket $4400/$1106.48 has been met, no co-insurance, no pre-authorization and no limit on visit. Passport/reference [phone].

## 2016-11-14 ENCOUNTER — Encounter (HOSPITAL_COMMUNITY)
Admission: RE | Admit: 2016-11-14 | Discharge: 2016-11-14 | Disposition: A | Discharge status: Home / Self Care | Payer: Medicare Other | Source: Ambulatory Visit | Attending: Cardiology | Admitting: Cardiology

## 2016-11-14 ENCOUNTER — Encounter (HOSPITAL_COMMUNITY): Payer: Self-pay

## 2016-11-14 VITALS — BP 122/78 | HR 69 | Ht 70.25 in | Wt 184.1 lb

## 2016-11-14 DIAGNOSIS — I214 Non-ST elevation (NSTEMI) myocardial infarction: Secondary | ICD-10-CM | POA: Insufficient documentation

## 2016-11-14 DIAGNOSIS — Z955 Presence of coronary angioplasty implant and graft: Secondary | ICD-10-CM | POA: Diagnosis present

## 2016-11-14 NOTE — Progress Notes (Signed)
Cardiac Individual Treatment Plan  Patient Details  Name: XADEN KAUFMAN MRN: [id number] Date of Birth: 1941-04-29 Referring Provider:     CARDIAC REHAB PHASE II ORIENTATION from 11/14/2016 in [facility]  Referring Provider  Kela Millin MD      Initial Encounter Date:    CARDIAC REHAB PHASE II ORIENTATION from 11/14/2016 in [facility]  Date  11/14/16  Referring Provider  Kela Millin MD      Visit Diagnosis: Status post coronary artery stent placement  Patient's Home Medications on Admission:  Current Outpatient Prescriptions:  .  acetaminophen (TYLENOL) 500 MG tablet, Take 1,000 mg by mouth every 6 (six) hours as needed for moderate pain., Disp: , Rfl:  .  aspirin EC 81 MG tablet, Take 81 mg by mouth daily., Disp: , Rfl:  .  atorvastatin (LIPITOR) 80 MG tablet, Take 1 tablet (80 mg total) by mouth daily at 6 PM., Disp: 30 tablet, Rfl: 1 .  BYSTOLIC 20 MG TABS, Take 10 mg by mouth daily. daily, Disp: , Rfl:  .  glyBURIDE (DIABETA) 2.5 MG tablet, Take 1 tablet (2.5 mg total) by mouth daily with breakfast., Disp: 30 tablet, Rfl: 0 .  isosorbide mononitrate (IMDUR) 60 MG 24 hr tablet, Take 1 tablet (60 mg total) by mouth daily., Disp: 30 tablet, Rfl: 1 .  Misc Natural Products (TART CHERRY ADVANCED PO), Take 1 tablet by mouth daily., Disp: , Rfl:  .  nitroGLYCERIN (NITROSTAT) 0.4 MG SL tablet, Place 1 tablet (0.4 mg total) under the tongue every 5 (five) minutes x 3 doses as needed for chest pain., Disp: 25 tablet, Rfl: 1 .  Tetrahydrozoline HCl (VISINE OP), Place 1 drop into both eyes daily as needed (dry eyes)., Disp: , Rfl:  .  ticagrelor (BRILINTA) 90 MG TABS tablet, Take 1 tablet (90 mg total) by mouth 2 (two) times daily., Disp: 60 tablet, Rfl: 0 .  valsartan-hydrochlorothiazide (DIOVAN-HCT) 80-12.5 MG tablet, Take 1 tablet by mouth daily., Disp: , Rfl:   Past Medical History: Past Medical History:   Diagnosis Date  . Arthritis    "little in my right hand; some in my right big toe" (10/22/2016)  . Borderline diabetes    "tx'd w/RX; blood surgars dropped too low; dr. dc'd it 09/25/2016; blood sugars fine since then" (10/22/2016)  . Chronic kidney disease    S/P right nephrectomy in 1984  . History of gout 04/2016; 09/2016   RLE; RLE  . History of kidney stones   . Hyperlipidemia   . Hypertension   . MGUS (monoclonal gammopathy of unknown significance)   . Migraine    "none in years" (10/22/2016)  . Myeloma ([place])    "dormant for the last 2-3 years" (10/22/2016)  . NSTEMI (non-ST elevated myocardial infarction) ([place]) 09/25/2016  . 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 Prostate cancer ([place]) 2006   s/p brachytherapy  . Seborrheic keratosis     Tobacco Use: History  Smoking Status  . Never Smoker  Smokeless Tobacco  . Never Used    Labs: Recent Review Flowsheet Data    Labs for ITP Cardiac and Pulmonary Rehab Latest Ref Rng & Units 09/25/2016   Cholestrol 0 - 200 mg/dL 199   LDLCALC 0 - 99 mg/dL 111(H)   HDL >40 mg/dL 41   Trlycerides <150 mg/dL 233(H)   Hemoglobin A1c 4.8 - 5.6 % 7.5(H)      Capillary  Blood Glucose: Lab Results  Component Value Date   GLUCAP 132 (H) 10/23/2016   GLUCAP 144 (H) 10/22/2016   GLUCAP 180 (H) 10/22/2016   GLUCAP 85 10/22/2016   GLUCAP 83 10/22/2016     Exercise Target Goals: Date: 11/14/16  Exercise Program Goal: Individual exercise prescription set with THRR, safety & activity barriers. Participant demonstrates ability to understand and report RPE using BORG scale, to self-measure pulse accurately, and to acknowledge the importance of the exercise prescription.  Exercise Prescription Goal: Starting with aerobic activity 30 plus minutes a day, 3 days per week for initial exercise prescription. Provide home exercise prescription and guidelines that participant acknowledges understanding prior to  discharge.  Activity Barriers & Risk Stratification:     Activity Barriers & Cardiac Risk Stratification - 11/14/16 0914      Activity Barriers & Cardiac Risk Stratification   Activity Barriers Left Knee Replacement   Cardiac Risk Stratification Moderate      6 Minute Walk:     6 Minute Walk    Row Name 11/14/16 1131         6 Minute Walk   Phase Initial     Distance 1300 feet     Walk Time 6 minutes     # of Rest Breaks 0     MPH 2.5     METS 2.4     RPE 11     VO2 Peak 8.6     Symptoms No     Resting HR 69 bpm     Resting BP 122/78     Max Ex. HR 75 bpm     Max Ex. BP 138/50     2 Minute Post BP 134/64        Oxygen Initial Assessment:   Oxygen Re-Evaluation:   Oxygen Discharge (Final Oxygen Re-Evaluation):   Initial Exercise Prescription:     Initial Exercise Prescription - 11/14/16 1100      Date of Initial Exercise RX and Referring Provider   Date 11/14/16   Referring Provider Kela Millin MD     Recumbant Bike   Level 1.5   Minutes 10   METs 2     NuStep   Level 2   SPM 80   Minutes 10   METs 2     Arm Ergometer   Level 25   Minutes 10   METs 2.2     Prescription Details   Frequency (times per week) 3   Duration Progress to 45 minutes of aerobic exercise without signs/symptoms of physical distress     Intensity   THRR 40-80% of Max Heartrate 57-114   Ratings of Perceived Exertion 11-13   Perceived Dyspnea 0-4     Progression   Progression Continue to progress workloads to maintain intensity without signs/symptoms of physical distress.     Resistance Training   Training Prescription Yes   Weight 2   Reps 10-15      Perform Capillary Blood Glucose checks as needed.  Exercise Prescription Changes:   Exercise Comments:   Exercise Goals and Review:     Exercise Goals    Row Name 11/14/16 1139             Exercise Goals   Increase Physical Activity Yes       Intervention Provide advice, education,  support and counseling about physical activity/exercise needs.;Develop an individualized exercise prescription for aerobic and resistive training based on initial evaluation findings, risk stratification, comorbidities and participant's personal  goals.       Expected Outcomes Achievement of increased cardiorespiratory fitness and enhanced flexibility, muscular endurance and strength shown through measurements of functional capacity and personal statement of participant.       Increase Strength and Stamina Yes       Intervention Provide advice, education, support and counseling about physical activity/exercise needs.;Develop an individualized exercise prescription for aerobic and resistive training based on initial evaluation findings, risk stratification, comorbidities and participant's personal goals.       Expected Outcomes Achievement of increased cardiorespiratory fitness and enhanced flexibility, muscular endurance and strength shown through measurements of functional capacity and personal statement of participant.          Exercise Goals Re-Evaluation :    Discharge Exercise Prescription (Final Exercise Prescription Changes):   Nutrition:  Target Goals: Understanding of nutrition guidelines, daily intake of sodium 1500mg , cholesterol 200mg , calories 30% from fat and 7% or less from saturated fats, daily to have 5 or more servings of fruits and vegetables.  Biometrics:     Pre Biometrics - 11/14/16 1140      Pre Biometrics   Waist Circumference 40.5 inches   Hip Circumference 39.5 inches   Waist to Hip Ratio 1.03 %   Triceps Skinfold 12 mm   % Body Fat 26.5 %   Grip Strength 36 kg   Flexibility 11 in   Single Leg Stand 9.3 seconds       Nutrition Therapy Plan and Nutrition Goals:   Nutrition Discharge: Nutrition Scores:   Nutrition Goals Re-Evaluation:   Nutrition Goals Re-Evaluation:   Nutrition Goals Discharge (Final Nutrition Goals  Re-Evaluation):   Psychosocial: Target Goals: Acknowledge presence or absence of significant depression and/or stress, maximize coping skills, provide positive support system. Participant is able to verbalize types and ability to use techniques and skills needed for reducing stress and depression.  Initial Review & Psychosocial Screening:     Initial Psych Review & Screening - 11/14/16 1214      Initial Review   Current issues with None Identified     Family Dynamics   Good Support System? Yes     Barriers   Psychosocial barriers to participate in program There are no identifiable barriers or psychosocial needs.      Quality of Life Scores:     Quality of Life - 11/14/16 1142      Quality of Life Scores   Health/Function Pre (P)  22.6 %   Socioeconomic Pre (P)  24.38 %   Psych/Spiritual Pre (P)  24.86 %   Family Pre (P)  26.25 %      PHQ-9: Recent Review Flowsheet Data    There is no flowsheet data to display.     Interpretation of Total Score  Total Score Depression Severity:  1-4 = Minimal depression, 5-9 = Mild depression, 10-14 = Moderate depression, 15-19 = Moderately severe depression, 20-27 = Severe depression   Psychosocial Evaluation and Intervention:   Psychosocial Re-Evaluation:   Psychosocial Discharge (Final Psychosocial Re-Evaluation):   Vocational Rehabilitation: Provide vocational rehab assistance to qualifying candidates.   Vocational Rehab Evaluation & Intervention:     Vocational Rehab - 11/14/16 1215      Initial Vocational Rehab Evaluation & Intervention   Assessment shows need for Vocational Rehabilitation No  pt is retired      Education: Education Goals: Education classes will be provided on a weekly basis, covering required topics. Participant will state understanding/return demonstration of topics presented.  Learning Barriers/Preferences:  Learning Barriers/Preferences - 11/14/16 0910      Learning  Barriers/Preferences   Learning Barriers None   Learning Preferences Verbal Instruction;Skilled Demonstration;Individual Instruction;Audio      Education Topics: Count Your Pulse:  -Group instruction provided by verbal instruction, demonstration, patient participation and written materials to support subject.  Instructors address importance of being able to find your pulse and how to count your pulse when at home without a heart monitor.  Patients get hands on experience counting their pulse with staff help and individually.   Heart Attack, Angina, and Risk Factor Modification:  -Group instruction provided by verbal instruction, video, and written materials to support subject.  Instructors address signs and symptoms of angina and heart attacks.    Also discuss risk factors for heart disease and how to make changes to improve heart health risk factors.   Functional Fitness:  -Group instruction provided by verbal instruction, demonstration, patient participation, and written materials to support subject.  Instructors address safety measures for doing things around the house.  Discuss how to get up and down off the floor, how to pick things up properly, how to safely get out of a chair without assistance, and balance training.   Meditation and Mindfulness:  -Group instruction provided by verbal instruction, patient participation, and written materials to support subject.  Instructor addresses importance of mindfulness and meditation practice to help reduce stress and improve awareness.  Instructor also leads participants through a meditation exercise.    Stretching for Flexibility and Mobility:  -Group instruction provided by verbal instruction, patient participation, and written materials to support subject.  Instructors lead participants through series of stretches that are designed to increase flexibility thus improving mobility.  These stretches are additional exercise for major muscle  groups that are typically performed during regular warm up and cool down.   Hands Only CPR:  -Group verbal, video, and participation provides a basic overview of AHA guidelines for community CPR. Role-play of emergencies allow participants the opportunity to practice calling for help and chest compression technique with discussion of AED use.   Hypertension: -Group verbal and written instruction that provides a basic overview of hypertension including the most recent diagnostic guidelines, risk factor reduction with self-care instructions and medication management.    Nutrition I class: Heart Healthy Eating:  -Group instruction provided by PowerPoint slides, verbal discussion, and written materials to support subject matter. The instructor gives an explanation and review of the Therapeutic Lifestyle Changes diet recommendations, which includes a discussion on lipid goals, dietary fat, sodium, fiber, plant stanol/sterol esters, sugar, and the components of a well-balanced, healthy diet.   Nutrition II class: Lifestyle Skills:  -Group instruction provided by PowerPoint slides, verbal discussion, and written materials to support subject matter. The instructor gives an explanation and review of label reading, grocery shopping for heart health, heart healthy recipe modifications, and ways to make healthier choices when eating out.   Diabetes Question & Answer:  -Group instruction provided by PowerPoint slides, verbal discussion, and written materials to support subject matter. The instructor gives an explanation and review of diabetes co-morbidities, pre- and post-prandial blood glucose goals, pre-exercise blood glucose goals, signs, symptoms, and treatment of hypoglycemia and hyperglycemia, and foot care basics.   Diabetes Blitz:  -Group instruction provided by PowerPoint slides, verbal discussion, and written materials to support subject matter. The instructor gives an explanation and review of  the physiology behind type 1 and type 2 diabetes, diabetes medications and rational behind using different medications, pre- and  post-prandial blood glucose recommendations and Hemoglobin A1c goals, diabetes diet, and exercise including blood glucose guidelines for exercising safely.    Portion Distortion:  -Group instruction provided by PowerPoint slides, verbal discussion, written materials, and food models to support subject matter. The instructor gives an explanation of serving size versus portion size, changes in portions sizes over the last 20 years, and what consists of a serving from each food group.   Stress Management:  -Group instruction provided by verbal instruction, video, and written materials to support subject matter.  Instructors review role of stress in heart disease and how to cope with stress positively.     Exercising on Your Own:  -Group instruction provided by verbal instruction, power point, and written materials to support subject.  Instructors discuss benefits of exercise, components of exercise, frequency and intensity of exercise, and end points for exercise.  Also discuss use of nitroglycerin and activating EMS.  Review options of places to exercise outside of rehab.  Review guidelines for sex with heart disease.   Cardiac Drugs I:  -Group instruction provided by verbal instruction and written materials to support subject.  Instructor reviews cardiac drug classes: antiplatelets, anticoagulants, beta blockers, and statins.  Instructor discusses reasons, side effects, and lifestyle considerations for each drug class.   Cardiac Drugs II:  -Group instruction provided by verbal instruction and written materials to support subject.  Instructor reviews cardiac drug classes: angiotensin converting enzyme inhibitors (ACE-I), angiotensin II receptor blockers (ARBs), nitrates, and calcium channel blockers.  Instructor discusses reasons, side effects, and lifestyle  considerations for each drug class.   Anatomy and Physiology of the Circulatory System:  Group verbal and written instruction and models provide basic cardiac anatomy and physiology, with the coronary electrical and arterial systems. Review of: AMI, Angina, Valve disease, Heart Failure, Peripheral Artery Disease, Cardiac Arrhythmia, Pacemakers, and the ICD.   Other Education:  -Group or individual verbal, written, or video instructions that support the educational goals of the cardiac rehab program.   Knowledge Questionnaire Score:     Knowledge Questionnaire Score - 11/14/16 1129      Knowledge Questionnaire Score   Pre Score 13/24      Core Components/Risk Factors/Patient Goals at Admission:     Personal Goals and Risk Factors at Admission - 11/14/16 1141      Core Components/Risk Factors/Patient Goals on Admission   Diabetes Yes   Intervention Provide education about signs/symptoms and action to take for hypo/hyperglycemia.;Provide education about proper nutrition, including hydration, and aerobic/resistive exercise prescription along with prescribed medications to achieve blood glucose in normal ranges: Fasting glucose 65-99 mg/dL   Expected Outcomes Short Term: Participant verbalizes understanding of the signs/symptoms and immediate care of hyper/hypoglycemia, proper foot care and importance of medication, aerobic/resistive exercise and nutrition plan for blood glucose control.;Long Term: Attainment of HbA1C < 7%.   Lipids Yes   Intervention Provide education and support for participant on nutrition & aerobic/resistive exercise along with prescribed medications to achieve LDL 70mg , HDL >40mg .   Expected Outcomes Short Term: Participant states understanding of desired cholesterol values and is compliant with medications prescribed. Participant is following exercise prescription and nutrition guidelines.;Long Term: Cholesterol controlled with medications as prescribed, with  individualized exercise RX and with personalized nutrition plan. Value goals: LDL < 70mg , HDL > 40 mg.      Core Components/Risk Factors/Patient Goals Review:    Core Components/Risk Factors/Patient Goals at Discharge (Final Review):    ITP Comments:     ITP Comments  [place] Name 11/14/16 0914           ITP Comments Dr. Fransico Him, Medical Director          Comments:  Patient attended orientation from 0800 to 100 to review rules and guidelines for program. Completed 6 minute walk test, Intitial ITP, and exercise prescription.  VSS. Telemetry-SR with no ectopy.  Asymptomatic.  Brief Psychosocial Assessment reveals no barriers to participating in cardiac rehab.  Pt is excited looking forward to starting CR next week. Cherre Huger, BSN Cardiac and Training and development officer

## 2016-11-14 NOTE — Progress Notes (Signed)
Cardiac Rehab Medication Review by a Pharmacist  Does the patient  feel that his/her medications are working for him/her?  yes  Has the patient been experiencing any side effects to the medications prescribed?  no  Does the patient measure his/her own blood pressure or blood glucose at home?  Yes, checks both blood pressure and blood glucose daily    Does the patient have any problems obtaining medications due to transportation or finances?   no  Understanding of regimen: good Understanding of indications: good Potential of compliance: excellent    Pharmacist comments: [name] is a pleasant 76 YO male, ambulating without assistance in the clinic. Reviewed SL NTG and hypoglycemia counseling points with patient. Patient reports tolerating medications well and feels that they are working for him currently. Patient reported some hypoglycemia and hypotension in the past couple of weeks. Patient verbalizes understanding of appropriate hypoglycemia management. No further concerns at this time    [name], [place].D. PGY1 Pharmacy Resident 11/14/2016 9:41 AM Main Pharmacy: [phone]

## 2016-11-15 NOTE — Progress Notes (Signed)
[name] 76 y.o. male       Nutrition Screen & Note  1. Status post coronary artery stent placement    Past Medical History:  Diagnosis Date  . Arthritis    "little in my right hand; some in my right big toe" (10/22/2016)  . Borderline diabetes    "tx'd w/RX; blood surgars dropped too low; dr. dc'd it 09/25/2016; blood sugars fine since then" (10/22/2016)  . Chronic kidney disease    S/P right nephrectomy in 1984  . History of gout 04/2016; 09/2016   RLE; RLE  . History of kidney stones   . Hyperlipidemia   . Hypertension   . MGUS (monoclonal gammopathy of unknown significance)   . Migraine    "none in years" (10/22/2016)  . Myeloma ([place])    "dormant for the last 2-3 years" (10/22/2016)  . NSTEMI (non-ST elevated myocardial infarction) ([place]) 09/25/2016  . 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 Prostate cancer ([place]) 2006   s/p brachytherapy  . Seborrheic keratosis    Meds reviewed. Glyburide noted  HT: Ht Readings from Last 1 Encounters:  11/14/16 5' 10.25" (1.784 m)    WT: Wt Readings from Last 3 Encounters:  11/14/16 184 lb 1.4 oz (83.5 kg)  10/23/16 182 lb 15.7 oz (83 kg)  09/27/16 189 lb 9.5 oz (86 kg)     BMI 26.3   Current tobacco use? No  Labs:  Lipid Panel     Component Value Date/Time   CHOL 199 09/25/2016 1243   TRIG 233 (H) 09/25/2016 1243   HDL 41 09/25/2016 1243   CHOLHDL 4.9 09/25/2016 1243   VLDL 47 (H) 09/25/2016 1243   LDLCALC 111 (H) 09/25/2016 1243    Lab Results  Component Value Date   HGBA1C 7.5 (H) 09/25/2016   CBG (last 3)  No results for input(s): GLUCAP in the last 72 hours.  Nutrition Note Spoke with pt. Nutrition plan and goals reviewed with pt. Pt is a "mild" diabetic according to pt. Pt's A1c indicates reasonable blood glucose control given pt age and co-morbidities. Pt expressed understanding of the information reviewed. Pt aware of nutrition education classes offered and is unable to attend  nutrition classes.  Nutrition Diagnosis ? Food-and nutrition-related knowledge deficit related to lack of exposure to information as related to diagnosis of: ? CVD ? DM ?   Nutrition Intervention ? Pt's individual nutrition plan reviewed with pt. ? Benefits of adopting Therapeutic Lifestyle Changes discussed when Medficts reviewed.   ? Pt given handouts for: ? Nutrition I class ? Nutrition II class   Nutrition Goal(s):  ? Pt to describe the benefit of including fruits, vegetables, whole grains, and low-fat dairy products in a heart healthy meal plan.  Plan:  Will provide client-centered nutrition education as part of interdisciplinary care.   Monitor and evaluate progress toward nutrition goal with team.  Derek M[place], M.Ed, RD, LDN, CDE 11/15/2016 8:33 AM

## 2016-11-22 ENCOUNTER — Encounter (HOSPITAL_COMMUNITY): Payer: Medicare Other

## 2016-11-22 ENCOUNTER — Encounter (HOSPITAL_COMMUNITY)
Admission: RE | Admit: 2016-11-22 | Discharge: 2016-11-22 | Disposition: A | Discharge status: Home / Self Care | Payer: Medicare Other | Source: Ambulatory Visit | Attending: Cardiology | Admitting: Cardiology

## 2016-11-22 DIAGNOSIS — Z955 Presence of coronary angioplasty implant and graft: Secondary | ICD-10-CM

## 2016-11-22 LAB — GLUCOSE, CAPILLARY
Glucose-Capillary: 181 mg/dL — ABNORMAL HIGH (ref 65–99)
Glucose-Capillary: 118 mg/dL — ABNORMAL HIGH (ref 65–99)

## 2016-11-22 NOTE — Progress Notes (Signed)
Daily Session Note  Patient Details  Name: [name] MRN: [id number] Date of Birth: 1940/12/08 Referring Provider:     CARDIAC REHAB PHASE II ORIENTATION from 11/14/2016 in [place]  Referring Provider  Kela Millin MD      Encounter Date: 11/22/2016  Check In:     Session Check In - 11/22/16 1208      Check-In   Location [facility]   Staff Present Seward Carol, MS, ACSM CEP, Exercise Physiologist;Maria Whitaker, RN, BSN;Joann Rion, RN, BSN;Amber Fair, MS, ACSM RCEP, Exercise Physiologist;Carlette Wilber Oliphant, RN, BSN   Supervising physician immediately available to respond to emergencies [facility] immediately available   Physician(s) Dr. [place] Pink   Medication changes reported     No   Fall or balance concerns reported    No   Tobacco Cessation No Change   Warm-up and Cool-down Performed as group-led instruction   Resistance Training Performed Yes   VAD Patient? No     Pain Assessment   Currently in Pain? No/denies      Capillary Blood Glucose: Results for orders placed or performed during the hospital encounter of 11/22/16 (from the past 24 hour(s))  Glucose, capillary     Status: Abnormal   Collection Time: 11/22/16 11:41 AM  Result Value Ref Range   Glucose-Capillary 181 (H) 65 - 99 mg/dL  Glucose, capillary     Status: Abnormal   Collection Time: 11/22/16 12:38 PM  Result Value Ref Range   Glucose-Capillary 118 (H) 65 - 99 mg/dL      History  Smoking Status  . Never Smoker  Smokeless Tobacco  . Never Used    Goals Met:  Exercise tolerated well Personal goals reviewed  Goals Unmet:  Not Applicable  Comments: Pt started  Full exercise at cardiac rehab today.  Pt tolerated light exercise without difficulty. VSS, telemetry-SR, asymptomatic.  Medication list reconciled. Pt denies barriers to medication compliance.  PSYCHOSOCIAL ASSESSMENT:  PHQ-0. Pt completed pre assessment quality of  life survey.  Pt scored the following     Quality of Life - 11/14/16 1142      Quality of Life Scores   Health/Function Pre (P)  22.6 %   Socioeconomic Pre (P)  24.38 %   Psych/Spiritual Pre (P)  24.86 %   Family Pre (P)  26.25 %       Pt exhibits positive coping skills, hopeful outlook with supportive family. No psychosocial needs identified at this time, no psychosocial interventions necessary.    Pt enjoys fishing and golfing.  Pt desires to get his strength and stamina back to where it was before his cardiac event.   Pt oriented to exercise equipment and routine.    Understanding verbalized. Maurice Small RN, BSN Cardiac and Pulmonary Rehab Nurse Navigator      Dr. Fransico Him is Medical Director for Cardiac Rehab at [facility].

## 2016-11-24 NOTE — Progress Notes (Signed)
Cardiac Individual Treatment Plan  Patient Details  Name: [name] MRN: [id number] Date of Birth: 10-23-1940 Referring Provider:     CARDIAC REHAB PHASE II ORIENTATION from 11/14/2016 in [place]  Referring Provider  Kela Millin MD      Initial Encounter Date:    CARDIAC REHAB PHASE II ORIENTATION from 11/14/2016 in [place]  Date  11/14/16  Referring Provider  Kela Millin MD      Visit Diagnosis: Status post coronary artery stent placement  Patient's Home Medications on Admission:  Current Outpatient Prescriptions:  .  acetaminophen (TYLENOL) 500 MG tablet, Take 1,000 mg by mouth every 6 (six) hours as needed for moderate pain., Disp: , Rfl:  .  aspirin EC 81 MG tablet, Take 81 mg by mouth daily., Disp: , Rfl:  .  atorvastatin (LIPITOR) 80 MG tablet, Take 1 tablet (80 mg total) by mouth daily at 6 PM., Disp: 30 tablet, Rfl: 1 .  BYSTOLIC 20 MG TABS, Take 10 mg by mouth daily. daily, Disp: , Rfl:  .  glyBURIDE (DIABETA) 2.5 MG tablet, Take 1 tablet (2.5 mg total) by mouth daily with breakfast., Disp: 30 tablet, Rfl: 0 .  isosorbide mononitrate (IMDUR) 60 MG 24 hr tablet, Take 1 tablet (60 mg total) by mouth daily., Disp: 30 tablet, Rfl: 1 .  Misc Natural Products (TART CHERRY ADVANCED PO), Take 1 tablet by mouth daily., Disp: , Rfl:  .  nitroGLYCERIN (NITROSTAT) 0.4 MG SL tablet, Place 1 tablet (0.4 mg total) under the tongue every 5 (five) minutes x 3 doses as needed for chest pain., Disp: 25 tablet, Rfl: 1 .  Tetrahydrozoline HCl (VISINE OP), Place 1 drop into both eyes daily as needed (dry eyes)., Disp: , Rfl:  .  ticagrelor (BRILINTA) 90 MG TABS tablet, Take 1 tablet (90 mg total) by mouth 2 (two) times daily., Disp: 60 tablet, Rfl: 0 .  valsartan-hydrochlorothiazide (DIOVAN-HCT) 80-12.5 MG tablet, Take 1 tablet by mouth daily., Disp: , Rfl:   Past Medical History: Past Medical History:   Diagnosis Date  . Arthritis    "little in my right hand; some in my right big toe" (10/22/2016)  . Borderline diabetes    "tx'd w/RX; blood surgars dropped too low; dr. dc'd it 09/25/2016; blood sugars fine since then" (10/22/2016)  . Chronic kidney disease    S/P right nephrectomy in 1984  . History of gout 04/2016; 09/2016   RLE; RLE  . History of kidney stones   . Hyperlipidemia   . Hypertension   . MGUS (monoclonal gammopathy of unknown significance)   . Migraine    "none in years" (10/22/2016)  . Myeloma ([place])    "dormant for the last 2-3 years" (10/22/2016)  . NSTEMI (non-ST elevated myocardial infarction) ([place]) 09/25/2016  . 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 Prostate cancer ([place]) 2006   s/p brachytherapy  . Seborrheic keratosis     Tobacco Use: History  Smoking Status  . Never Smoker  Smokeless Tobacco  . Never Used    Labs: Recent Review Flowsheet Data    Labs for ITP Cardiac and Pulmonary Rehab Latest Ref Rng & Units 09/25/2016   Cholestrol 0 - 200 mg/dL 199   LDLCALC 0 - 99 mg/dL 111(H)   HDL >40 mg/dL 41   Trlycerides <150 mg/dL 233(H)   Hemoglobin A1c 4.8 - 5.6 % 7.5(H)      Capillary  Blood Glucose: Lab Results  Component Value Date   GLUCAP 118 (H) 11/22/2016   GLUCAP 181 (H) 11/22/2016   GLUCAP 132 (H) 10/23/2016   GLUCAP 144 (H) 10/22/2016   GLUCAP 180 (H) 10/22/2016     Exercise Target Goals:    Exercise Program Goal: Individual exercise prescription set with THRR, safety & activity barriers. Participant demonstrates ability to understand and report RPE using BORG scale, to self-measure pulse accurately, and to acknowledge the importance of the exercise prescription.  Exercise Prescription Goal: Starting with aerobic activity 30 plus minutes a day, 3 days per week for initial exercise prescription. Provide home exercise prescription and guidelines that participant acknowledges understanding prior to  discharge.  Activity Barriers & Risk Stratification:     Activity Barriers & Cardiac Risk Stratification - 11/14/16 0914      Activity Barriers & Cardiac Risk Stratification   Activity Barriers Left Knee Replacement   Cardiac Risk Stratification Moderate      6 Minute Walk:     6 Minute Walk    Row Name 11/14/16 1131         6 Minute Walk   Phase Initial     Distance 1300 feet     Walk Time 6 minutes     # of Rest Breaks 0     MPH 2.5     METS 2.4     RPE 11     VO2 Peak 8.6     Symptoms No     Resting HR 69 bpm     Resting BP 122/78     Max Ex. HR 75 bpm     Max Ex. BP 138/50     2 Minute Post BP 134/64        Oxygen Initial Assessment:   Oxygen Re-Evaluation:   Oxygen Discharge (Final Oxygen Re-Evaluation):   Initial Exercise Prescription:     Initial Exercise Prescription - 11/14/16 1100      Date of Initial Exercise RX and Referring Provider   Date 11/14/16   Referring Provider Kela Millin MD     Recumbant Bike   Level 1.5   Minutes 10   METs 2     NuStep   Level 2   SPM 80   Minutes 10   METs 2     Arm Ergometer   Level 25   Minutes 10   METs 2.2     Prescription Details   Frequency (times per week) 3   Duration Progress to 45 minutes of aerobic exercise without signs/symptoms of physical distress     Intensity   THRR 40-80% of Max Heartrate 57-114   Ratings of Perceived Exertion 11-13   Perceived Dyspnea 0-4     Progression   Progression Continue to progress workloads to maintain intensity without signs/symptoms of physical distress.     Resistance Training   Training Prescription Yes   Weight 2   Reps 10-15      Perform Capillary Blood Glucose checks as needed.  Exercise Prescription Changes:   Exercise Comments:   Exercise Goals and Review:     Exercise Goals    Row Name 11/14/16 1139             Exercise Goals   Increase Physical Activity Yes       Intervention Provide advice, education,  support and counseling about physical activity/exercise needs.;Develop an individualized exercise prescription for aerobic and resistive training based on initial evaluation findings, risk stratification, comorbidities and  participant's personal goals.       Expected Outcomes Achievement of increased cardiorespiratory fitness and enhanced flexibility, muscular endurance and strength shown through measurements of functional capacity and personal statement of participant.       Increase Strength and Stamina Yes       Intervention Provide advice, education, support and counseling about physical activity/exercise needs.;Develop an individualized exercise prescription for aerobic and resistive training based on initial evaluation findings, risk stratification, comorbidities and participant's personal goals.       Expected Outcomes Achievement of increased cardiorespiratory fitness and enhanced flexibility, muscular endurance and strength shown through measurements of functional capacity and personal statement of participant.          Exercise Goals Re-Evaluation :    Discharge Exercise Prescription (Final Exercise Prescription Changes):   Nutrition:  Target Goals: Understanding of nutrition guidelines, daily intake of sodium 1500mg , cholesterol 200mg , calories 30% from fat and 7% or less from saturated fats, daily to have 5 or more servings of fruits and vegetables.  Biometrics:     Pre Biometrics - 11/14/16 1140      Pre Biometrics   Waist Circumference 40.5 inches   Hip Circumference 39.5 inches   Waist to Hip Ratio 1.03 %   Triceps Skinfold 12 mm   % Body Fat 26.5 %   Grip Strength 36 kg   Flexibility 11 in   Single Leg Stand 9.3 seconds       Nutrition Therapy Plan and Nutrition Goals:     Nutrition Therapy & Goals - 11/15/16 0830      Nutrition Therapy   Diet Therapeutic Lifestyle Changes     Intervention Plan   Intervention Prescribe, educate and counsel regarding  individualized specific dietary modifications aiming towards targeted core components such as weight, hypertension, lipid management, diabetes, heart failure and other comorbidities.   Expected Outcomes Short Term Goal: Understand basic principles of dietary content, such as calories, fat, sodium, cholesterol and nutrients.;Long Term Goal: Adherence to prescribed nutrition plan.      Nutrition Discharge: Nutrition Scores:     Nutrition Assessments - 11/15/16 0830      MEDFICTS Scores   Pre Score 12      Nutrition Goals Re-Evaluation:   Nutrition Goals Re-Evaluation:   Nutrition Goals Discharge (Final Nutrition Goals Re-Evaluation):   Psychosocial: Target Goals: Acknowledge presence or absence of significant depression and/or stress, maximize coping skills, provide positive support system. Participant is able to verbalize types and ability to use techniques and skills needed for reducing stress and depression.  Initial Review & Psychosocial Screening:     Initial Psych Review & Screening - 11/14/16 1214      Initial Review   Current issues with None Identified     Family Dynamics   Good Support System? Yes     Barriers   Psychosocial barriers to participate in program There are no identifiable barriers or psychosocial needs.      Quality of Life Scores:     Quality of Life - 11/14/16 1142      Quality of Life Scores   Health/Function Pre (P)  22.6 %   Socioeconomic Pre (P)  24.38 %   Psych/Spiritual Pre (P)  24.86 %   Family Pre (P)  26.25 %      PHQ-9: Recent Review Flowsheet Data    Depression screen [facility] 2/9 11/22/2016   Decreased Interest 0   Down, Depressed, Hopeless 0   PHQ - 2 Score 0  Interpretation of Total Score  Total Score Depression Severity:  1-4 = Minimal depression, 5-9 = Mild depression, 10-14 = Moderate depression, 15-19 = Moderately severe depression, 20-27 = Severe depression   Psychosocial Evaluation and Intervention:      Psychosocial Evaluation - 11/22/16 1421      Psychosocial Evaluation & Interventions   Interventions Encouraged to exercise with the program and follow exercise prescription;Relaxation education;Stress management education   Continue Psychosocial Services  Follow up required by staff      Psychosocial Re-Evaluation:     Psychosocial Re-Evaluation    [place] Name 11/24/16 1627             Psychosocial Re-Evaluation   Current issues with None Identified       Interventions Relaxation education;Stress management education;Encouraged to attend Cardiac Rehabilitation for the exercise       Continue Psychosocial Services  Follow up required by staff          Psychosocial Discharge (Final Psychosocial Re-Evaluation):     Psychosocial Re-Evaluation - 11/24/16 1627      Psychosocial Re-Evaluation   Current issues with None Identified   Interventions Relaxation education;Stress management education;Encouraged to attend Cardiac Rehabilitation for the exercise   Continue Psychosocial Services  Follow up required by staff      Vocational Rehabilitation: Provide vocational rehab assistance to qualifying candidates.   Vocational Rehab Evaluation & Intervention:     Vocational Rehab - 11/14/16 1215      Initial Vocational Rehab Evaluation & Intervention   Assessment shows need for Vocational Rehabilitation No  pt is retired      Education: Education Goals: Education classes will be provided on a weekly basis, covering required topics. Participant will state understanding/return demonstration of topics presented.  Learning Barriers/Preferences:     Learning Barriers/Preferences - 11/14/16 0910      Learning Barriers/Preferences   Learning Barriers None   Learning Preferences Verbal Instruction;Skilled Demonstration;Individual Instruction;Audio      Education Topics: Count Your Pulse:  -Group instruction provided by verbal instruction, demonstration, patient  participation and written materials to support subject.  Instructors address importance of being able to find your pulse and how to count your pulse when at home without a heart monitor.  Patients get hands on experience counting their pulse with staff help and individually.   Heart Attack, Angina, and Risk Factor Modification:  -Group instruction provided by verbal instruction, video, and written materials to support subject.  Instructors address signs and symptoms of angina and heart attacks.    Also discuss risk factors for heart disease and how to make changes to improve heart health risk factors.   Functional Fitness:  -Group instruction provided by verbal instruction, demonstration, patient participation, and written materials to support subject.  Instructors address safety measures for doing things around the house.  Discuss how to get up and down off the floor, how to pick things up properly, how to safely get out of a chair without assistance, and balance training.   Meditation and Mindfulness:  -Group instruction provided by verbal instruction, patient participation, and written materials to support subject.  Instructor addresses importance of mindfulness and meditation practice to help reduce stress and improve awareness.  Instructor also leads participants through a meditation exercise.    Stretching for Flexibility and Mobility:  -Group instruction provided by verbal instruction, patient participation, and written materials to support subject.  Instructors lead participants through series of stretches that are designed to increase flexibility thus improving mobility.  These stretches are additional exercise for major muscle groups that are typically performed during regular warm up and cool down.   Hands Only CPR:  -Group verbal, video, and participation provides a basic overview of AHA guidelines for community CPR. Role-play of emergencies allow participants the opportunity to  practice calling for help and chest compression technique with discussion of AED use.   Hypertension: -Group verbal and written instruction that provides a basic overview of hypertension including the most recent diagnostic guidelines, risk factor reduction with self-care instructions and medication management.    Nutrition I class: Heart Healthy Eating:  -Group instruction provided by PowerPoint slides, verbal discussion, and written materials to support subject matter. The instructor gives an explanation and review of the Therapeutic Lifestyle Changes diet recommendations, which includes a discussion on lipid goals, dietary fat, sodium, fiber, plant stanol/sterol esters, sugar, and the components of a well-balanced, healthy diet.   CARDIAC REHAB PHASE II EXERCISE from 11/22/2016 in [place]  Date  11/14/16  Educator  RD  Instruction Review Code  Not applicable [class handouts given]      Nutrition II class: Lifestyle Skills:  -Group instruction provided by PowerPoint slides, verbal discussion, and written materials to support subject matter. The instructor gives an explanation and review of label reading, grocery shopping for heart health, heart healthy recipe modifications, and ways to make healthier choices when eating out.   CARDIAC REHAB PHASE II EXERCISE from 11/22/2016 in [place]  Date  11/14/16  Educator  RD  Instruction Review Code  Not applicable [class handouts given]      Diabetes Question & Answer:  -Group instruction provided by PowerPoint slides, verbal discussion, and written materials to support subject matter. The instructor gives an explanation and review of diabetes co-morbidities, pre- and post-prandial blood glucose goals, pre-exercise blood glucose goals, signs, symptoms, and treatment of hypoglycemia and hyperglycemia, and foot care basics.   CARDIAC REHAB PHASE II EXERCISE from 11/22/2016 in [place]  Date  11/22/16  Educator  RD  Instruction Review Code  2- meets goals/outcomes      Diabetes Blitz:  -Group instruction provided by PowerPoint slides, verbal discussion, and written materials to support subject matter. The instructor gives an explanation and review of the physiology behind type 1 and type 2 diabetes, diabetes medications and rational behind using different medications, pre- and post-prandial blood glucose recommendations and Hemoglobin A1c goals, diabetes diet, and exercise including blood glucose guidelines for exercising safely.    Portion Distortion:  -Group instruction provided by PowerPoint slides, verbal discussion, written materials, and food models to support subject matter. The instructor gives an explanation of serving size versus portion size, changes in portions sizes over the last 20 years, and what consists of a serving from each food group.   Stress Management:  -Group instruction provided by verbal instruction, video, and written materials to support subject matter.  Instructors review role of stress in heart disease and how to cope with stress positively.     Exercising on Your Own:  -Group instruction provided by verbal instruction, power point, and written materials to support subject.  Instructors discuss benefits of exercise, components of exercise, frequency and intensity of exercise, and end points for exercise.  Also discuss use of nitroglycerin and activating EMS.  Review options of places to exercise outside of rehab.  Review guidelines for sex with heart disease.   Cardiac Drugs I:  -Group  instruction provided by verbal instruction and written materials to support subject.  Instructor reviews cardiac drug classes: antiplatelets, anticoagulants, beta blockers, and statins.  Instructor discusses reasons, side effects, and lifestyle considerations for each drug class.   Cardiac Drugs II:  -Group instruction  provided by verbal instruction and written materials to support subject.  Instructor reviews cardiac drug classes: angiotensin converting enzyme inhibitors (ACE-I), angiotensin II receptor blockers (ARBs), nitrates, and calcium channel blockers.  Instructor discusses reasons, side effects, and lifestyle considerations for each drug class.   Anatomy and Physiology of the Circulatory System:  Group verbal and written instruction and models provide basic cardiac anatomy and physiology, with the coronary electrical and arterial systems. Review of: AMI, Angina, Valve disease, Heart Failure, Peripheral Artery Disease, Cardiac Arrhythmia, Pacemakers, and the ICD.   Other Education:  -Group or individual verbal, written, or video instructions that support the educational goals of the cardiac rehab program.   Knowledge Questionnaire Score:     Knowledge Questionnaire Score - 11/14/16 1129      Knowledge Questionnaire Score   Pre Score 13/24      Core Components/Risk Factors/Patient Goals at Admission:     Personal Goals and Risk Factors at Admission - 11/14/16 1141      Core Components/Risk Factors/Patient Goals on Admission   Diabetes Yes   Intervention Provide education about signs/symptoms and action to take for hypo/hyperglycemia.;Provide education about proper nutrition, including hydration, and aerobic/resistive exercise prescription along with prescribed medications to achieve blood glucose in normal ranges: Fasting glucose 65-99 mg/dL   Expected Outcomes Short Term: Participant verbalizes understanding of the signs/symptoms and immediate care of hyper/hypoglycemia, proper foot care and importance of medication, aerobic/resistive exercise and nutrition plan for blood glucose control.;Long Term: Attainment of HbA1C < 7%.   Lipids Yes   Intervention Provide education and support for participant on nutrition & aerobic/resistive exercise along with prescribed medications to achieve LDL  70mg , HDL >40mg .   Expected Outcomes Short Term: Participant states understanding of desired cholesterol values and is compliant with medications prescribed. Participant is following exercise prescription and nutrition guidelines.;Long Term: Cholesterol controlled with medications as prescribed, with individualized exercise RX and with personalized nutrition plan. Value goals: LDL < 70mg , HDL > 40 mg.      Core Components/Risk Factors/Patient Goals Review:      Goals and Risk Factor Review    Row Name 11/24/16 1623             Core Components/Risk Factors/Patient Goals Review   Personal Goals Review Diabetes;Hypertension       Review Pt is off to a good start toward achieving his desired goals.  Continue to monitor his progress.       Expected Outcomes Pt will achieve his desired goals with improved diabetes management and blood pressure readings within normal limits.          Core Components/Risk Factors/Patient Goals at Discharge (Final Review):      Goals and Risk Factor Review - 11/24/16 1623      Core Components/Risk Factors/Patient Goals Review   Personal Goals Review Diabetes;Hypertension   Review Pt is off to a good start toward achieving his desired goals.  Continue to monitor his progress.   Expected Outcomes Pt will achieve his desired goals with improved diabetes management and blood pressure readings within normal limits.      ITP Comments:     ITP Comments    Row Name 11/14/16 [phone]  ITP Comments Dr. Fransico Him, Medical Director          Comments:  Filiberto is off to a great start and is making the expected progress toward personal goals after completing 2 sessions. Psychosocial Assessment reveals no barriers to participating in rehab. Pt is happy go lucky and demonstrates a healthy and positive outlook on life.  Pt feels supported in his rehab participation by his family. Pt desires to get his strength and stamina back. No psychosocial needs  identified at this time, no psychosocial interventions necessary. Recommend continued exercise and life style modification education including  stress management and relaxation techniques to decrease cardiac risk profile.  Cherre Huger, BSN Cardiac and Training and development officer

## 2016-11-25 ENCOUNTER — Encounter (HOSPITAL_COMMUNITY)
Admission: RE | Admit: 2016-11-25 | Discharge: 2016-11-25 | Disposition: A | Discharge status: Home / Self Care | Payer: Medicare Other | Source: Ambulatory Visit | Attending: Cardiology | Admitting: Cardiology

## 2016-11-25 ENCOUNTER — Encounter (HOSPITAL_COMMUNITY): Payer: Medicare Other

## 2016-11-25 DIAGNOSIS — Z955 Presence of coronary angioplasty implant and graft: Secondary | ICD-10-CM | POA: Diagnosis not present

## 2016-11-25 LAB — GLUCOSE, CAPILLARY
GLUCOSE-CAPILLARY: 265 mg/dL — AB (ref 65–99)
GLUCOSE-CAPILLARY: 156 mg/dL — AB (ref 65–99)

## 2016-11-27 ENCOUNTER — Encounter (HOSPITAL_COMMUNITY): Payer: Medicare Other

## 2016-11-27 ENCOUNTER — Encounter (HOSPITAL_COMMUNITY)
Admission: RE | Admit: 2016-11-27 | Discharge: 2016-11-27 | Disposition: A | Discharge status: Home / Self Care | Payer: Medicare Other | Source: Ambulatory Visit | Attending: Cardiology | Admitting: Cardiology

## 2016-11-27 DIAGNOSIS — Z955 Presence of coronary angioplasty implant and graft: Secondary | ICD-10-CM | POA: Diagnosis not present

## 2016-11-29 ENCOUNTER — Encounter (HOSPITAL_COMMUNITY)
Admission: RE | Admit: 2016-11-29 | Discharge: 2016-11-29 | Disposition: A | Discharge status: Home / Self Care | Payer: Medicare Other | Source: Ambulatory Visit | Attending: Cardiology | Admitting: Cardiology

## 2016-11-29 ENCOUNTER — Encounter (HOSPITAL_COMMUNITY): Payer: Medicare Other

## 2016-11-29 DIAGNOSIS — Z955 Presence of coronary angioplasty implant and graft: Secondary | ICD-10-CM | POA: Diagnosis not present

## 2016-12-02 ENCOUNTER — Encounter (HOSPITAL_COMMUNITY): Payer: Medicare Other

## 2016-12-02 ENCOUNTER — Encounter (HOSPITAL_COMMUNITY)
Admission: RE | Admit: 2016-12-02 | Discharge: 2016-12-02 | Disposition: A | Discharge status: Home / Self Care | Payer: Medicare Other | Source: Ambulatory Visit | Attending: Cardiology | Admitting: Cardiology

## 2016-12-02 DIAGNOSIS — Z955 Presence of coronary angioplasty implant and graft: Secondary | ICD-10-CM

## 2016-12-02 LAB — GLUCOSE, CAPILLARY: Glucose-Capillary: 171 mg/dL — ABNORMAL HIGH (ref 65–99)

## 2016-12-03 NOTE — Progress Notes (Signed)
Reviewed home exercise with pt today.  Pt plans to walk for exercise, 2x/week in addition to coming to cardiac rehab.  Reviewed THR, pulse, RPE, sign and symptoms, NTG use, and when to call 911 or MD.  Also discussed weather considerations and indoor options.  Pt voiced understanding.    [name] [employer]

## 2016-12-04 ENCOUNTER — Encounter (HOSPITAL_COMMUNITY): Payer: Medicare Other

## 2016-12-06 ENCOUNTER — Encounter (HOSPITAL_COMMUNITY): Payer: Medicare Other

## 2016-12-06 ENCOUNTER — Encounter (HOSPITAL_COMMUNITY)
Admission: RE | Admit: 2016-12-06 | Discharge: 2016-12-06 | Disposition: A | Discharge status: Home / Self Care | Payer: Medicare Other | Source: Ambulatory Visit | Attending: Cardiology | Admitting: Cardiology

## 2016-12-06 DIAGNOSIS — Z955 Presence of coronary angioplasty implant and graft: Secondary | ICD-10-CM | POA: Diagnosis not present

## 2016-12-09 ENCOUNTER — Encounter (HOSPITAL_COMMUNITY)
Admission: RE | Admit: 2016-12-09 | Discharge: 2016-12-09 | Disposition: A | Discharge status: Home / Self Care | Payer: Medicare Other | Source: Ambulatory Visit | Attending: Cardiology | Admitting: Cardiology

## 2016-12-09 ENCOUNTER — Encounter (HOSPITAL_COMMUNITY): Payer: Medicare Other

## 2016-12-09 DIAGNOSIS — Z955 Presence of coronary angioplasty implant and graft: Secondary | ICD-10-CM | POA: Diagnosis not present

## 2016-12-09 LAB — GLUCOSE, CAPILLARY: Glucose-Capillary: 170 mg/dL — ABNORMAL HIGH (ref 65–99)

## 2016-12-11 ENCOUNTER — Encounter (HOSPITAL_COMMUNITY): Payer: Medicare Other

## 2016-12-11 ENCOUNTER — Encounter (HOSPITAL_COMMUNITY)
Admission: RE | Admit: 2016-12-11 | Discharge: 2016-12-11 | Disposition: A | Discharge status: Home / Self Care | Payer: Medicare Other | Source: Ambulatory Visit | Attending: Cardiology | Admitting: Cardiology

## 2016-12-11 DIAGNOSIS — I214 Non-ST elevation (NSTEMI) myocardial infarction: Secondary | ICD-10-CM | POA: Insufficient documentation

## 2016-12-11 DIAGNOSIS — Z955 Presence of coronary angioplasty implant and graft: Secondary | ICD-10-CM | POA: Diagnosis present

## 2016-12-11 LAB — GLUCOSE, CAPILLARY: Glucose-Capillary: 231 mg/dL — ABNORMAL HIGH (ref 65–99)

## 2016-12-13 ENCOUNTER — Encounter (HOSPITAL_COMMUNITY)
Admission: RE | Admit: 2016-12-13 | Discharge: 2016-12-13 | Disposition: A | Discharge status: Home / Self Care | Payer: Medicare Other | Source: Ambulatory Visit | Attending: Cardiology | Admitting: Cardiology

## 2016-12-13 ENCOUNTER — Encounter (HOSPITAL_COMMUNITY): Payer: Medicare Other

## 2016-12-13 DIAGNOSIS — Z955 Presence of coronary angioplasty implant and graft: Secondary | ICD-10-CM

## 2016-12-16 ENCOUNTER — Encounter (HOSPITAL_COMMUNITY): Payer: Medicare Other

## 2016-12-16 ENCOUNTER — Encounter (HOSPITAL_COMMUNITY)
Admission: RE | Admit: 2016-12-16 | Discharge: 2016-12-16 | Disposition: A | Discharge status: Home / Self Care | Payer: Medicare Other | Source: Ambulatory Visit | Attending: Cardiology | Admitting: Cardiology

## 2016-12-16 DIAGNOSIS — Z955 Presence of coronary angioplasty implant and graft: Secondary | ICD-10-CM

## 2016-12-18 ENCOUNTER — Encounter (HOSPITAL_COMMUNITY)
Admission: RE | Admit: 2016-12-18 | Discharge: 2016-12-18 | Disposition: A | Discharge status: Home / Self Care | Payer: Medicare Other | Source: Ambulatory Visit | Attending: Cardiology | Admitting: Cardiology

## 2016-12-18 ENCOUNTER — Encounter (HOSPITAL_COMMUNITY): Payer: Medicare Other

## 2016-12-18 DIAGNOSIS — Z955 Presence of coronary angioplasty implant and graft: Secondary | ICD-10-CM

## 2016-12-18 LAB — GLUCOSE, CAPILLARY: GLUCOSE-CAPILLARY: 295 mg/dL — AB (ref 65–99)

## 2016-12-19 ENCOUNTER — Encounter (HOSPITAL_COMMUNITY): Payer: Self-pay

## 2016-12-19 DIAGNOSIS — M79601 Pain in right arm: Secondary | ICD-10-CM | POA: Insufficient documentation

## 2016-12-19 DIAGNOSIS — Z5321 Procedure and treatment not carried out due to patient leaving prior to being seen by health care provider: Secondary | ICD-10-CM | POA: Insufficient documentation

## 2016-12-19 NOTE — ED Triage Notes (Signed)
Pt here for arm pain in right bicep. No other pains noted, right elbow is swollen, red and war to touch. Denies injury to arm.

## 2016-12-20 ENCOUNTER — Encounter (HOSPITAL_COMMUNITY): Payer: Medicare Other

## 2016-12-20 ENCOUNTER — Emergency Department (HOSPITAL_COMMUNITY)
Admission: EM | Admit: 2016-12-20 | Discharge: 2016-12-20 | Disposition: A | Discharge status: Home / Self Care | Payer: Medicare Other | Attending: Emergency Medicine | Admitting: Emergency Medicine

## 2016-12-20 ENCOUNTER — Telehealth (HOSPITAL_COMMUNITY): Payer: Self-pay | Admitting: Internal Medicine

## 2016-12-20 NOTE — ED Notes (Signed)
Pt states he is going to go see his doctor in the am.

## 2016-12-23 ENCOUNTER — Encounter (HOSPITAL_COMMUNITY): Payer: Medicare Other

## 2016-12-23 NOTE — Progress Notes (Signed)
Cardiac Individual Treatment Plan  Patient Details  Name: [name] MRN: [id number] Date of Birth: 05/15/1940 Referring Provider:     CARDIAC REHAB PHASE II ORIENTATION from 11/14/2016 in [place]  Referring Provider  Kela Millin MD      Initial Encounter Date:    CARDIAC REHAB PHASE II ORIENTATION from 11/14/2016 in [place]  Date  11/14/16  Referring Provider  Kela Millin MD      Visit Diagnosis: Status post coronary artery stent placement  Patient's Home Medications on Admission:  Current Outpatient Prescriptions:  .  acetaminophen (TYLENOL) 500 MG tablet, Take 1,000 mg by mouth every 6 (six) hours as needed for moderate pain., Disp: , Rfl:  .  aspirin EC 81 MG tablet, Take 81 mg by mouth daily., Disp: , Rfl:  .  atorvastatin (LIPITOR) 80 MG tablet, Take 1 tablet (80 mg total) by mouth daily at 6 PM., Disp: 30 tablet, Rfl: 1 .  BYSTOLIC 20 MG TABS, Take 10 mg by mouth daily. daily, Disp: , Rfl:  .  glyBURIDE (DIABETA) 2.5 MG tablet, Take 1 tablet (2.5 mg total) by mouth daily with breakfast., Disp: 30 tablet, Rfl: 0 .  isosorbide mononitrate (IMDUR) 60 MG 24 hr tablet, Take 1 tablet (60 mg total) by mouth daily., Disp: 30 tablet, Rfl: 1 .  Misc Natural Products (TART CHERRY ADVANCED PO), Take 1 tablet by mouth daily., Disp: , Rfl:  .  nitroGLYCERIN (NITROSTAT) 0.4 MG SL tablet, Place 1 tablet (0.4 mg total) under the tongue every 5 (five) minutes x 3 doses as needed for chest pain., Disp: 25 tablet, Rfl: 1 .  Tetrahydrozoline HCl (VISINE OP), Place 1 drop into both eyes daily as needed (dry eyes)., Disp: , Rfl:  .  ticagrelor (BRILINTA) 90 MG TABS tablet, Take 1 tablet (90 mg total) by mouth 2 (two) times daily., Disp: 60 tablet, Rfl: 0 .  valsartan-hydrochlorothiazide (DIOVAN-HCT) 80-12.5 MG tablet, Take 1 tablet by mouth daily., Disp: , Rfl:   Past Medical History: Past Medical History:   Diagnosis Date  . Arthritis    "little in my right hand; some in my right big toe" (10/22/2016)  . Borderline diabetes    "tx'd w/RX; blood surgars dropped too low; dr. dc'd it 09/25/2016; blood sugars fine since then" (10/22/2016)  . Chronic kidney disease    S/P right nephrectomy in 1984  . History of gout 04/2016; 09/2016   RLE; RLE  . History of kidney stones   . Hyperlipidemia   . Hypertension   . MGUS (monoclonal gammopathy of unknown significance)   . Migraine    "none in years" (10/22/2016)  . Myeloma ([place])    "dormant for the last 2-3 years" (10/22/2016)  . NSTEMI (non-ST elevated myocardial infarction) ([place]) 09/25/2016  . 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 Prostate cancer ([place]) 2006   s/p brachytherapy  . Seborrheic keratosis     Tobacco Use: History  Smoking Status  . Never Smoker  Smokeless Tobacco  . Never Used    Labs: Recent Review Flowsheet Data    Labs for ITP Cardiac and Pulmonary Rehab Latest Ref Rng & Units 09/25/2016   Cholestrol 0 - 200 mg/dL 199   LDLCALC 0 - 99 mg/dL 111(H)   HDL >40 mg/dL 41   Trlycerides <150 mg/dL 233(H)   Hemoglobin A1c 4.8 - 5.6 % 7.5(H)      Capillary  Blood Glucose: Lab Results  Component Value Date   GLUCAP 295 (H) 12/18/2016   GLUCAP 231 (H) 12/11/2016   GLUCAP 170 (H) 12/09/2016   GLUCAP 171 (H) 12/02/2016   GLUCAP 156 (H) 11/25/2016     Exercise Target Goals:    Exercise Program Goal: Individual exercise prescription set with THRR, safety & activity barriers. Participant demonstrates ability to understand and report RPE using BORG scale, to self-measure pulse accurately, and to acknowledge the importance of the exercise prescription.  Exercise Prescription Goal: Starting with aerobic activity 30 plus minutes a day, 3 days per week for initial exercise prescription. Provide home exercise prescription and guidelines that participant acknowledges understanding prior to  discharge.  Activity Barriers & Risk Stratification:     Activity Barriers & Cardiac Risk Stratification - 11/14/16 0914      Activity Barriers & Cardiac Risk Stratification   Activity Barriers Left Knee Replacement   Cardiac Risk Stratification Moderate      6 Minute Walk:     6 Minute Walk    Row Name 11/14/16 1131         6 Minute Walk   Phase Initial     Distance 1300 feet     Walk Time 6 minutes     # of Rest Breaks 0     MPH 2.5     METS 2.4     RPE 11     VO2 Peak 8.6     Symptoms No     Resting HR 69 bpm     Resting BP 122/78     Max Ex. HR 75 bpm     Max Ex. BP 138/50     2 Minute Post BP 134/64        Oxygen Initial Assessment:   Oxygen Re-Evaluation:   Oxygen Discharge (Final Oxygen Re-Evaluation):   Initial Exercise Prescription:     Initial Exercise Prescription - 11/14/16 1100      Date of Initial Exercise RX and Referring Provider   Date 11/14/16   Referring Provider Kela Millin MD     Recumbant Bike   Level 1.5   Minutes 10   METs 2     NuStep   Level 2   SPM 80   Minutes 10   METs 2     Arm Ergometer   Level 25   Minutes 10   METs 2.2     Prescription Details   Frequency (times per week) 3   Duration Progress to 45 minutes of aerobic exercise without signs/symptoms of physical distress     Intensity   THRR 40-80% of Max Heartrate 57-114   Ratings of Perceived Exertion 11-13   Perceived Dyspnea 0-4     Progression   Progression Continue to progress workloads to maintain intensity without signs/symptoms of physical distress.     Resistance Training   Training Prescription Yes   Weight 2   Reps 10-15      Perform Capillary Blood Glucose checks as needed.  Exercise Prescription Changes:      Exercise Prescription Changes    Row Name 11/25/16 1400             Response to Exercise   Blood Pressure (Admit) 114/72       Blood Pressure (Exercise) 142/72       Blood Pressure (Exit) 118/60        Heart Rate (Admit) 65 bpm       Heart Rate (Exercise) 104 bpm  Heart Rate (Exit) 65 bpm       Rating of Perceived Exertion (Exercise) 12       Symptoms none       Comments pt was oriented to exercise equipment on 11/22/16. Pt did well with exercise sesion       Duration Continue with 30 min of aerobic exercise without signs/symptoms of physical distress.       Intensity THRR unchanged         Resistance Training   Training Prescription Yes       Weight 2       Reps 10-15       Time 10 Minutes         Recumbant Bike   Level 1.5       Minutes 10       METs 2         NuStep   Level 2       SPM 80       Minutes 10       METs 2.2         Arm Ergometer   Level 25       Minutes 10       METs 2.57          Exercise Comments:      Exercise Comments    Row Name 11/25/16 1430           Exercise Comments Pt was oriented to exercise equipment on 11/22/16. Pt did well with first session; will continue to monitor.          Exercise Goals and Review:      Exercise Goals    Row Name 11/14/16 1139             Exercise Goals   Increase Physical Activity Yes       Intervention Provide advice, education, support and counseling about physical activity/exercise needs.;Develop an individualized exercise prescription for aerobic and resistive training based on initial evaluation findings, risk stratification, comorbidities and participant's personal goals.       Expected Outcomes Achievement of increased cardiorespiratory fitness and enhanced flexibility, muscular endurance and strength shown through measurements of functional capacity and personal statement of participant.       Increase Strength and Stamina Yes       Intervention Provide advice, education, support and counseling about physical activity/exercise needs.;Develop an individualized exercise prescription for aerobic and resistive training based on initial evaluation findings, risk stratification, comorbidities  and participant's personal goals.       Expected Outcomes Achievement of increased cardiorespiratory fitness and enhanced flexibility, muscular endurance and strength shown through measurements of functional capacity and personal statement of participant.          Exercise Goals Re-Evaluation :     Exercise Goals Re-Evaluation    Row Name 12/03/16 1038             Exercise Goal Re-Evaluation   Exercise Goals Review Increase Strenth and Stamina       Comments Reviewed home exercise with pt today.  Pt plans to walk for exercise, 2x/week in addition to coming to cardiac rehab.  Reviewed THR, pulse, RPE, sign and symptoms, NTG use, and when to call 911 or MD.  Also discussed weather considerations and indoor options.  Pt voiced understanding.       Expected Outcomes Pt will be compliant with HEP and will improve in cardiorespiratory fitness.  Discharge Exercise Prescription (Final Exercise Prescription Changes):     Exercise Prescription Changes - 11/25/16 1400      Response to Exercise   Blood Pressure (Admit) 114/72   Blood Pressure (Exercise) 142/72   Blood Pressure (Exit) 118/60   Heart Rate (Admit) 65 bpm   Heart Rate (Exercise) 104 bpm   Heart Rate (Exit) 65 bpm   Rating of Perceived Exertion (Exercise) 12   Symptoms none   Comments pt was oriented to exercise equipment on 11/22/16. Pt did well with exercise sesion   Duration Continue with 30 min of aerobic exercise without signs/symptoms of physical distress.   Intensity THRR unchanged     Resistance Training   Training Prescription Yes   Weight 2   Reps 10-15   Time 10 Minutes     Recumbant Bike   Level 1.5   Minutes 10   METs 2     NuStep   Level 2   SPM 80   Minutes 10   METs 2.2     Arm Ergometer   Level 25   Minutes 10   METs 2.57      Nutrition:  Target Goals: Understanding of nutrition guidelines, daily intake of sodium 1500mg , cholesterol 200mg , calories 30% from fat and 7% or  less from saturated fats, daily to have 5 or more servings of fruits and vegetables.  Biometrics:     Pre Biometrics - 11/14/16 1140      Pre Biometrics   Waist Circumference 40.5 inches   Hip Circumference 39.5 inches   Waist to Hip Ratio 1.03 %   Triceps Skinfold 12 mm   % Body Fat 26.5 %   Grip Strength 36 kg   Flexibility 11 in   Single Leg Stand 9.3 seconds       Nutrition Therapy Plan and Nutrition Goals:     Nutrition Therapy & Goals - 11/15/16 0830      Nutrition Therapy   Diet Therapeutic Lifestyle Changes     Intervention Plan   Intervention Prescribe, educate and counsel regarding individualized specific dietary modifications aiming towards targeted core components such as weight, hypertension, lipid management, diabetes, heart failure and other comorbidities.   Expected Outcomes Short Term Goal: Understand basic principles of dietary content, such as calories, fat, sodium, cholesterol and nutrients.;Long Term Goal: Adherence to prescribed nutrition plan.      Nutrition Discharge: Nutrition Scores:     Nutrition Assessments - 11/15/16 0830      MEDFICTS Scores   Pre Score 12      Nutrition Goals Re-Evaluation:   Nutrition Goals Re-Evaluation:   Nutrition Goals Discharge (Final Nutrition Goals Re-Evaluation):   Psychosocial: Target Goals: Acknowledge presence or absence of significant depression and/or stress, maximize coping skills, provide positive support system. Participant is able to verbalize types and ability to use techniques and skills needed for reducing stress and depression.  Initial Review & Psychosocial Screening:     Initial Psych Review & Screening - 11/14/16 1214      Initial Review   Current issues with None Identified     Family Dynamics   Good Support System? Yes     Barriers   Psychosocial barriers to participate in program There are no identifiable barriers or psychosocial needs.      Quality of Life Scores:      Quality of Life - 11/14/16 1142      Quality of Life Scores   Health/Function Pre (P)  22.6 %  Socioeconomic Pre (P)  24.38 %   Psych/Spiritual Pre (P)  24.86 %   Family Pre (P)  26.25 %      PHQ-9: Recent Review Flowsheet Data    Depression screen [facility] 2/9 11/22/2016   Decreased Interest 0   Down, Depressed, Hopeless 0   PHQ - 2 Score 0     Interpretation of Total Score  Total Score Depression Severity:  1-4 = Minimal depression, 5-9 = Mild depression, 10-14 = Moderate depression, 15-19 = Moderately severe depression, 20-27 = Severe depression   Psychosocial Evaluation and Intervention:     Psychosocial Evaluation - 12/23/16 1533      Psychosocial Evaluation & Interventions   Interventions Encouraged to exercise with the program and follow exercise prescription;Relaxation education;Stress management education   Comments pt with supportive family.   Expected Outcomes Pt will continue to exhibit positive and healthy coping skills.    Continue Psychosocial Services  No Follow up required      Psychosocial Re-Evaluation:     Psychosocial Re-Evaluation    [place] Name 11/24/16 1627             Psychosocial Re-Evaluation   Current issues with None Identified       Interventions Relaxation education;Stress management education;Encouraged to attend Cardiac Rehabilitation for the exercise       Continue Psychosocial Services  Follow up required by staff          Psychosocial Discharge (Final Psychosocial Re-Evaluation):     Psychosocial Re-Evaluation - 11/24/16 1627      Psychosocial Re-Evaluation   Current issues with None Identified   Interventions Relaxation education;Stress management education;Encouraged to attend Cardiac Rehabilitation for the exercise   Continue Psychosocial Services  Follow up required by staff      Vocational Rehabilitation: Provide vocational rehab assistance to qualifying candidates.   Vocational Rehab Evaluation & Intervention:      Vocational Rehab - 11/14/16 1215      Initial Vocational Rehab Evaluation & Intervention   Assessment shows need for Vocational Rehabilitation No  pt is retired      Education: Education Goals: Education classes will be provided on a weekly basis, covering required topics. Participant will state understanding/return demonstration of topics presented.  Learning Barriers/Preferences:     Learning Barriers/Preferences - 11/14/16 0910      Learning Barriers/Preferences   Learning Barriers None   Learning Preferences Verbal Instruction;Skilled Demonstration;Individual Instruction;Audio      Education Topics: Count Your Pulse:  -Group instruction provided by verbal instruction, demonstration, patient participation and written materials to support subject.  Instructors address importance of being able to find your pulse and how to count your pulse when at home without a heart monitor.  Patients get hands on experience counting their pulse with staff help and individually.   Heart Attack, Angina, and Risk Factor Modification:  -Group instruction provided by verbal instruction, video, and written materials to support subject.  Instructors address signs and symptoms of angina and heart attacks.    Also discuss risk factors for heart disease and how to make changes to improve heart health risk factors.   Functional Fitness:  -Group instruction provided by verbal instruction, demonstration, patient participation, and written materials to support subject.  Instructors address safety measures for doing things around the house.  Discuss how to get up and down off the floor, how to pick things up properly, how to safely get out of a chair without assistance, and balance training.   CARDIAC REHAB  PHASE II EXERCISE from 12/18/2016 in [place]  Date  11/29/16  Educator  ep  Instruction Review Code  2- meets goals/outcomes      Meditation and Mindfulness:   -Group instruction provided by verbal instruction, patient participation, and written materials to support subject.  Instructor addresses importance of mindfulness and meditation practice to help reduce stress and improve awareness.  Instructor also leads participants through a meditation exercise.    Stretching for Flexibility and Mobility:  -Group instruction provided by verbal instruction, patient participation, and written materials to support subject.  Instructors lead participants through series of stretches that are designed to increase flexibility thus improving mobility.  These stretches are additional exercise for major muscle groups that are typically performed during regular warm up and cool down.   CARDIAC REHAB PHASE II EXERCISE from 12/18/2016 in [place]  Date  12/06/16  Instruction Review Code  2- meets goals/outcomes      Hands Only CPR:  -Group verbal, video, and participation provides a basic overview of AHA guidelines for community CPR. Role-play of emergencies allow participants the opportunity to practice calling for help and chest compression technique with discussion of AED use.   Hypertension: -Group verbal and written instruction that provides a basic overview of hypertension including the most recent diagnostic guidelines, risk factor reduction with self-care instructions and medication management.    Nutrition I class: Heart Healthy Eating:  -Group instruction provided by PowerPoint slides, verbal discussion, and written materials to support subject matter. The instructor gives an explanation and review of the Therapeutic Lifestyle Changes diet recommendations, which includes a discussion on lipid goals, dietary fat, sodium, fiber, plant stanol/sterol esters, sugar, and the components of a well-balanced, healthy diet.   CARDIAC REHAB PHASE II EXERCISE from 12/18/2016 in [place]  Date  11/14/16   Educator  RD  Instruction Review Code  Not applicable [class handouts given]      Nutrition II class: Lifestyle Skills:  -Group instruction provided by PowerPoint slides, verbal discussion, and written materials to support subject matter. The instructor gives an explanation and review of label reading, grocery shopping for heart health, heart healthy recipe modifications, and ways to make healthier choices when eating out.   CARDIAC REHAB PHASE II EXERCISE from 12/18/2016 in [place]  Date  11/14/16  Educator  RD  Instruction Review Code  Not applicable [class handouts given]      Diabetes Question & Answer:  -Group instruction provided by PowerPoint slides, verbal discussion, and written materials to support subject matter. The instructor gives an explanation and review of diabetes co-morbidities, pre- and post-prandial blood glucose goals, pre-exercise blood glucose goals, signs, symptoms, and treatment of hypoglycemia and hyperglycemia, and foot care basics.   CARDIAC REHAB PHASE II EXERCISE from 12/18/2016 in [place]  Date  12/13/16  Educator  RD  Instruction Review Code  2- meets goals/outcomes      Diabetes Blitz:  -Group instruction provided by PowerPoint slides, verbal discussion, and written materials to support subject matter. The instructor gives an explanation and review of the physiology behind type 1 and type 2 diabetes, diabetes medications and rational behind using different medications, pre- and post-prandial blood glucose recommendations and Hemoglobin A1c goals, diabetes diet, and exercise including blood glucose guidelines for exercising safely.    Portion Distortion:  -Group instruction provided by PowerPoint slides, verbal discussion, written materials,  and food models to support subject matter. The instructor gives an explanation of serving size versus portion size, changes in portions sizes over the  last 20 years, and what consists of a serving from each food group.   Stress Management:  -Group instruction provided by verbal instruction, video, and written materials to support subject matter.  Instructors review role of stress in heart disease and how to cope with stress positively.     Exercising on Your Own:  -Group instruction provided by verbal instruction, power point, and written materials to support subject.  Instructors discuss benefits of exercise, components of exercise, frequency and intensity of exercise, and end points for exercise.  Also discuss use of nitroglycerin and activating EMS.  Review options of places to exercise outside of rehab.  Review guidelines for sex with heart disease.   CARDIAC REHAB PHASE II EXERCISE from 12/18/2016 in [place]  Date  11/27/16  Instruction Review Code  2- meets goals/outcomes      Cardiac Drugs I:  -Group instruction provided by verbal instruction and written materials to support subject.  Instructor reviews cardiac drug classes: antiplatelets, anticoagulants, beta blockers, and statins.  Instructor discusses reasons, side effects, and lifestyle considerations for each drug class.   CARDIAC REHAB PHASE II EXERCISE from 12/18/2016 in [place]  Date  12/18/16  Instruction Review Code  2- meets goals/outcomes      Cardiac Drugs II:  -Group instruction provided by verbal instruction and written materials to support subject.  Instructor reviews cardiac drug classes: angiotensin converting enzyme inhibitors (ACE-I), angiotensin II receptor blockers (ARBs), nitrates, and calcium channel blockers.  Instructor discusses reasons, side effects, and lifestyle considerations for each drug class.   Anatomy and Physiology of the Circulatory System:  Group verbal and written instruction and models provide basic cardiac anatomy and physiology, with the coronary electrical and arterial  systems. Review of: AMI, Angina, Valve disease, Heart Failure, Peripheral Artery Disease, Cardiac Arrhythmia, Pacemakers, and the ICD.   CARDIAC REHAB PHASE II EXERCISE from 12/18/2016 in [place]  Date  12/11/16  Educator  RN  Instruction Review Code  2- meets goals/outcomes      Other Education:  -Group or individual verbal, written, or video instructions that support the educational goals of the cardiac rehab program.   Knowledge Questionnaire Score:     Knowledge Questionnaire Score - 11/14/16 1129      Knowledge Questionnaire Score   Pre Score 13/24      Core Components/Risk Factors/Patient Goals at Admission:     Personal Goals and Risk Factors at Admission - 11/14/16 1141      Core Components/Risk Factors/Patient Goals on Admission   Diabetes Yes   Intervention Provide education about signs/symptoms and action to take for hypo/hyperglycemia.;Provide education about proper nutrition, including hydration, and aerobic/resistive exercise prescription along with prescribed medications to achieve blood glucose in normal ranges: Fasting glucose 65-99 mg/dL   Expected Outcomes Short Term: Participant verbalizes understanding of the signs/symptoms and immediate care of hyper/hypoglycemia, proper foot care and importance of medication, aerobic/resistive exercise and nutrition plan for blood glucose control.;Long Term: Attainment of HbA1C < 7%.   Lipids Yes   Intervention Provide education and support for participant on nutrition & aerobic/resistive exercise along with prescribed medications to achieve LDL 70mg , HDL >40mg .   Expected Outcomes Short Term: Participant states understanding of desired cholesterol values and is compliant with medications prescribed. Participant is following  exercise prescription and nutrition guidelines.;Long Term: Cholesterol controlled with medications as prescribed, with individualized exercise RX and with personalized  nutrition plan. Value goals: LDL < 70mg , HDL > 40 mg.      Core Components/Risk Factors/Patient Goals Review:      Goals and Risk Factor Review    Row Name 11/24/16 1623 12/23/16 1532           Core Components/Risk Factors/Patient Goals Review   Personal Goals Review Diabetes;Hypertension Diabetes;Hypertension      Review Pt is off to a good start toward achieving his desired goals.  Continue to monitor his progress. Pt is off to a good start toward achieving the desired goals of diabetes and bp readings within normal limits. Continue to monitor his progress.      Expected Outcomes Pt will achieve his desired goals with improved diabetes management and blood pressure readings within normal limits. Pt will achieve his desired goals with improved diabetes management and blood pressure readings within normal limits.         Core Components/Risk Factors/Patient Goals at Discharge (Final Review):      Goals and Risk Factor Review - 12/23/16 1532      Core Components/Risk Factors/Patient Goals Review   Personal Goals Review Diabetes;Hypertension   Review Pt is off to a good start toward achieving the desired goals of diabetes and bp readings within normal limits. Continue to monitor his progress.   Expected Outcomes Pt will achieve his desired goals with improved diabetes management and blood pressure readings within normal limits.      ITP Comments:     ITP Comments    Row Name 11/14/16 0914           ITP Comments Dr. Fransico Him, Medical Director          Comments: "[name]" is making expected progress toward personal goals after completing 13 sessions. Pt with recent gout flare up to his elbow and he has been unable to exercise. Psychosocial Assessment reveals no barriers to participating in rehab. Pt is happy go lucky and demonstrates a healthy and positive outlook on life.  Pt feels supported in his rehab participation by his family. Pt desires to get his strength and  stamina back. No psychosocial needs identified at this time, no psychosocial interventions necessary.Recommend continued exercise and life style modification education including  stress management and relaxation techniques to decrease cardiac risk profile. Cherre Huger, BSN Cardiac and Training and development officer

## 2016-12-25 ENCOUNTER — Encounter (HOSPITAL_COMMUNITY): Payer: Medicare Other

## 2016-12-25 ENCOUNTER — Encounter (HOSPITAL_COMMUNITY)
Admission: RE | Admit: 2016-12-25 | Discharge: 2016-12-25 | Disposition: A | Discharge status: Home / Self Care | Payer: Medicare Other | Source: Ambulatory Visit | Attending: Cardiology | Admitting: Cardiology

## 2016-12-25 DIAGNOSIS — Z955 Presence of coronary angioplasty implant and graft: Secondary | ICD-10-CM

## 2016-12-27 ENCOUNTER — Encounter (HOSPITAL_COMMUNITY)
Admission: RE | Admit: 2016-12-27 | Discharge: 2016-12-27 | Disposition: A | Discharge status: Home / Self Care | Payer: Medicare Other | Source: Ambulatory Visit | Attending: Cardiology | Admitting: Cardiology

## 2016-12-27 ENCOUNTER — Encounter (HOSPITAL_COMMUNITY): Payer: Medicare Other

## 2016-12-27 DIAGNOSIS — Z955 Presence of coronary angioplasty implant and graft: Secondary | ICD-10-CM

## 2016-12-27 LAB — GLUCOSE, CAPILLARY: GLUCOSE-CAPILLARY: 136 mg/dL — AB (ref 65–99)

## 2016-12-30 ENCOUNTER — Encounter (HOSPITAL_COMMUNITY)
Admission: RE | Admit: 2016-12-30 | Discharge: 2016-12-30 | Disposition: A | Discharge status: Home / Self Care | Payer: Medicare Other | Source: Ambulatory Visit | Attending: Cardiology | Admitting: Cardiology

## 2016-12-30 ENCOUNTER — Encounter (HOSPITAL_COMMUNITY): Payer: Medicare Other

## 2016-12-30 DIAGNOSIS — Z955 Presence of coronary angioplasty implant and graft: Secondary | ICD-10-CM | POA: Diagnosis not present

## 2017-01-01 ENCOUNTER — Encounter (HOSPITAL_COMMUNITY)
Admission: RE | Admit: 2017-01-01 | Discharge: 2017-01-01 | Disposition: A | Discharge status: Home / Self Care | Payer: Medicare Other | Source: Ambulatory Visit | Attending: Cardiology | Admitting: Cardiology

## 2017-01-01 ENCOUNTER — Encounter (HOSPITAL_COMMUNITY): Payer: Medicare Other

## 2017-01-01 DIAGNOSIS — Z955 Presence of coronary angioplasty implant and graft: Secondary | ICD-10-CM | POA: Diagnosis not present

## 2017-01-01 LAB — GLUCOSE, CAPILLARY: Glucose-Capillary: 122 mg/dL — ABNORMAL HIGH (ref 65–99)

## 2017-01-03 ENCOUNTER — Encounter (HOSPITAL_COMMUNITY)
Admission: RE | Admit: 2017-01-03 | Discharge: 2017-01-03 | Disposition: A | Discharge status: Home / Self Care | Payer: Medicare Other | Source: Ambulatory Visit | Attending: Cardiology | Admitting: Cardiology

## 2017-01-03 ENCOUNTER — Encounter (HOSPITAL_COMMUNITY): Payer: Medicare Other

## 2017-01-03 DIAGNOSIS — Z955 Presence of coronary angioplasty implant and graft: Secondary | ICD-10-CM | POA: Diagnosis not present

## 2017-01-06 ENCOUNTER — Encounter (HOSPITAL_COMMUNITY): Payer: Medicare Other

## 2017-01-08 ENCOUNTER — Encounter (HOSPITAL_COMMUNITY): Payer: Medicare Other

## 2017-01-10 ENCOUNTER — Encounter (HOSPITAL_COMMUNITY)
Admission: RE | Admit: 2017-01-10 | Discharge: 2017-01-10 | Disposition: A | Discharge status: Home / Self Care | Payer: Medicare Other | Source: Ambulatory Visit | Attending: Cardiology | Admitting: Cardiology

## 2017-01-10 ENCOUNTER — Encounter (HOSPITAL_COMMUNITY): Payer: Medicare Other

## 2017-01-10 DIAGNOSIS — Z955 Presence of coronary angioplasty implant and graft: Secondary | ICD-10-CM

## 2017-01-15 ENCOUNTER — Encounter (HOSPITAL_COMMUNITY)
Admission: RE | Admit: 2017-01-15 | Discharge: 2017-01-15 | Disposition: A | Discharge status: Home / Self Care | Payer: Medicare Other | Source: Ambulatory Visit | Attending: Cardiology | Admitting: Cardiology

## 2017-01-15 ENCOUNTER — Encounter (HOSPITAL_COMMUNITY): Payer: Medicare Other

## 2017-01-15 DIAGNOSIS — I214 Non-ST elevation (NSTEMI) myocardial infarction: Secondary | ICD-10-CM | POA: Diagnosis not present

## 2017-01-15 DIAGNOSIS — Z955 Presence of coronary angioplasty implant and graft: Secondary | ICD-10-CM | POA: Insufficient documentation

## 2017-01-15 LAB — GLUCOSE, CAPILLARY: GLUCOSE-CAPILLARY: 151 mg/dL — AB (ref 65–99)

## 2017-01-15 NOTE — Progress Notes (Signed)
[name] 76 y.o. male       Nutrition Note  1. Status post coronary artery stent placement    Nutrition Note Spoke with pt. Pt is following step 2 of the TLC diet. Nutrition plan and survey goals reviewed with pt. Pt previously given Nutrition class handouts. Pt denies any questions re: nutrition or class handouts given. Pt expressed understanding of the information reviewed.   Nutrition Diagnosis ? Food-and nutrition-related knowledge deficit related to lack of exposure to information as related to diagnosis of: ? CVD ? DM   Nutrition Intervention ? Pt's individual nutrition plan reviewed with pt.  Nutrition Goal(s):  ? Pt to describe the benefit of including fruits, vegetables, whole grains, and low-fat dairy products in a heart healthy meal plan.  Plan:  Will provide client-centered nutrition education as part of interdisciplinary care.   Monitor and evaluate progress toward nutrition goal with team.  Derek Mound, M.Ed, RD, LDN, CDE 01/15/2017 1:37 PM

## 2017-01-17 ENCOUNTER — Encounter (HOSPITAL_COMMUNITY): Payer: Medicare Other

## 2017-01-17 ENCOUNTER — Encounter (HOSPITAL_COMMUNITY)
Admission: RE | Admit: 2017-01-17 | Discharge: 2017-01-17 | Disposition: A | Discharge status: Home / Self Care | Payer: Medicare Other | Source: Ambulatory Visit | Attending: Cardiology | Admitting: Cardiology

## 2017-01-17 DIAGNOSIS — Z955 Presence of coronary angioplasty implant and graft: Secondary | ICD-10-CM | POA: Diagnosis not present

## 2017-01-17 LAB — GLUCOSE, CAPILLARY: Glucose-Capillary: 265 mg/dL — ABNORMAL HIGH (ref 65–99)

## 2017-01-20 ENCOUNTER — Encounter (HOSPITAL_COMMUNITY)
Admission: RE | Admit: 2017-01-20 | Discharge: 2017-01-20 | Disposition: A | Discharge status: Home / Self Care | Payer: Medicare Other | Source: Ambulatory Visit | Attending: Cardiology | Admitting: Cardiology

## 2017-01-20 ENCOUNTER — Encounter (HOSPITAL_COMMUNITY): Payer: Medicare Other

## 2017-01-20 DIAGNOSIS — Z955 Presence of coronary angioplasty implant and graft: Secondary | ICD-10-CM | POA: Diagnosis not present

## 2017-01-21 NOTE — Progress Notes (Signed)
Cardiac Individual Treatment Plan  Patient Details  Name: [name] MRN: [id number] Date of Birth: 09/25/1940 Referring Provider:     CARDIAC REHAB PHASE II ORIENTATION from 11/14/2016 in [place]  Referring Provider  Kela Millin MD      Initial Encounter Date:    CARDIAC REHAB PHASE II ORIENTATION from 11/14/2016 in [place]  Date  11/14/16  Referring Provider  Kela Millin MD      Visit Diagnosis: Status post coronary artery stent placement  Patient's Home Medications on Admission:  Current Outpatient Prescriptions:  .  acetaminophen (TYLENOL) 500 MG tablet, Take 1,000 mg by mouth every 6 (six) hours as needed for moderate pain., Disp: , Rfl:  .  aspirin EC 81 MG tablet, Take 81 mg by mouth daily., Disp: , Rfl:  .  atorvastatin (LIPITOR) 80 MG tablet, Take 1 tablet (80 mg total) by mouth daily at 6 PM., Disp: 30 tablet, Rfl: 1 .  BYSTOLIC 20 MG TABS, Take 10 mg by mouth daily. daily, Disp: , Rfl:  .  glyBURIDE (DIABETA) 2.5 MG tablet, Take 1 tablet (2.5 mg total) by mouth daily with breakfast., Disp: 30 tablet, Rfl: 0 .  isosorbide mononitrate (IMDUR) 60 MG 24 hr tablet, Take 1 tablet (60 mg total) by mouth daily., Disp: 30 tablet, Rfl: 1 .  Misc Natural Products (TART CHERRY ADVANCED PO), Take 1 tablet by mouth daily., Disp: , Rfl:  .  nitroGLYCERIN (NITROSTAT) 0.4 MG SL tablet, Place 1 tablet (0.4 mg total) under the tongue every 5 (five) minutes x 3 doses as needed for chest pain., Disp: 25 tablet, Rfl: 1 .  Olmesartan-Amlodipine-HCTZ 20-5-12.5 MG TABS, Take by mouth., Disp: , Rfl:  .  Tetrahydrozoline HCl (VISINE OP), Place 1 drop into both eyes daily as needed (dry eyes)., Disp: , Rfl:  .  ticagrelor (BRILINTA) 90 MG TABS tablet, Take 1 tablet (90 mg total) by mouth 2 (two) times daily., Disp: 60 tablet, Rfl: 0  Past Medical History: Past Medical History:  Diagnosis Date  . Arthritis     "little in my right hand; some in my right big toe" (10/22/2016)  . Borderline diabetes    "tx'd w/RX; blood surgars dropped too low; dr. dc'd it 09/25/2016; blood sugars fine since then" (10/22/2016)  . Chronic kidney disease    S/P right nephrectomy in 1984  . History of gout 04/2016; 09/2016   RLE; RLE  . History of kidney stones   . Hyperlipidemia   . Hypertension   . MGUS (monoclonal gammopathy of unknown significance)   . Migraine    "none in years" (10/22/2016)  . Myeloma ([place])    "dormant for the last 2-3 years" (10/22/2016)  . NSTEMI (non-ST elevated myocardial infarction) ([place]) 09/25/2016  . 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 Prostate cancer ([place]) 2006   s/p brachytherapy  . Seborrheic keratosis     Tobacco Use: History  Smoking Status  . Never Smoker  Smokeless Tobacco  . Never Used    Labs: Recent Review Flowsheet Data    Labs for ITP Cardiac and Pulmonary Rehab Latest Ref Rng & Units 09/25/2016   Cholestrol 0 - 200 mg/dL 199   LDLCALC 0 - 99 mg/dL 111(H)   HDL >40 mg/dL 41   Trlycerides <150 mg/dL 233(H)   Hemoglobin A1c 4.8 - 5.6 % 7.5(H)      Capillary Blood Glucose: Lab Results  Component Value Date   GLUCAP 265 (H) 01/17/2017   GLUCAP 151 (H) 01/15/2017   GLUCAP 122 (H) 01/01/2017   GLUCAP 136 (H) 12/27/2016   GLUCAP 295 (H) 12/18/2016     Exercise Target Goals:    Exercise Program Goal: Individual exercise prescription set with THRR, safety & activity barriers. Participant demonstrates ability to understand and report RPE using BORG scale, to self-measure pulse accurately, and to acknowledge the importance of the exercise prescription.  Exercise Prescription Goal: Starting with aerobic activity 30 plus minutes a day, 3 days per week for initial exercise prescription. Provide home exercise prescription and guidelines that participant acknowledges understanding prior to discharge.  Activity Barriers & Risk  Stratification:     Activity Barriers & Cardiac Risk Stratification - 11/14/16 0914      Activity Barriers & Cardiac Risk Stratification   Activity Barriers Left Knee Replacement   Cardiac Risk Stratification Moderate      6 Minute Walk:     6 Minute Walk    Row Name 11/14/16 1131         6 Minute Walk   Phase Initial     Distance 1300 feet     Walk Time 6 minutes     # of Rest Breaks 0     MPH 2.5     METS 2.4     RPE 11     VO2 Peak 8.6     Symptoms No     Resting HR 69 bpm     Resting BP 122/78     Max Ex. HR 75 bpm     Max Ex. BP 138/50     2 Minute Post BP 134/64        Oxygen Initial Assessment:   Oxygen Re-Evaluation:   Oxygen Discharge (Final Oxygen Re-Evaluation):   Initial Exercise Prescription:     Initial Exercise Prescription - 11/14/16 1100      Date of Initial Exercise RX and Referring Provider   Date 11/14/16   Referring Provider Kela Millin MD     Recumbant Bike   Level 1.5   Minutes 10   METs 2     NuStep   Level 2   SPM 80   Minutes 10   METs 2     Arm Ergometer   Level 25   Minutes 10   METs 2.2     Prescription Details   Frequency (times per week) 3   Duration Progress to 45 minutes of aerobic exercise without signs/symptoms of physical distress     Intensity   THRR 40-80% of Max Heartrate 57-114   Ratings of Perceived Exertion 11-13   Perceived Dyspnea 0-4     Progression   Progression Continue to progress workloads to maintain intensity without signs/symptoms of physical distress.     Resistance Training   Training Prescription Yes   Weight 2   Reps 10-15      Perform Capillary Blood Glucose checks as needed.  Exercise Prescription Changes:      Exercise Prescription Changes    Row Name 11/25/16 1400 12/18/16 1241 01/03/17 1241 01/16/17 1200       Response to Exercise   Blood Pressure (Admit) 114/72 130/64 120/70 122/70    Blood Pressure (Exercise) 142/72 144/62 120/70 150/80    Blood  Pressure (Exit) 118/60 108/70 122/80 136/70    Heart Rate (Admit) 65 bpm 54 bpm 55 bpm 54 bpm    Heart Rate (Exercise) 104 bpm 96 bpm  90 bpm 88 bpm    Heart Rate (Exit) 65 bpm 66 bpm 55 bpm 54 bpm    Rating of Perceived Exertion (Exercise) 12 13 13 11     Symptoms none none none none    Comments pt was oriented to exercise equipment on 11/22/16. Pt did well with exercise sesion  -  -  -    Duration Continue with 30 min of aerobic exercise without signs/symptoms of physical distress. Continue with 30 min of aerobic exercise without signs/symptoms of physical distress. Continue with 30 min of aerobic exercise without signs/symptoms of physical distress. Continue with 30 min of aerobic exercise without signs/symptoms of physical distress.    Intensity THRR unchanged THRR unchanged THRR unchanged THRR unchanged      Progression   Progression  - Continue to progress workloads to maintain intensity without signs/symptoms of physical distress. Continue to progress workloads to maintain intensity without signs/symptoms of physical distress. Continue to progress workloads to maintain intensity without signs/symptoms of physical distress.    Average METs  - 3.2 3.1 3.1      Resistance Training   Training Prescription Yes Yes Yes Yes    Weight 2 4lbs 4lbs 4lbs    Reps 10-15 10-15 10-15 10-15    Time 10 Minutes 10 Minutes 10 Minutes 10 Minutes      Recumbant Bike   Level 1.5 2.5 2.5 2.5    Minutes 10 10 10 10     METs 2 3.1 3.2 3.2      NuStep   Level 2 3 3 3     SPM 80 100 80 80    Minutes 10 10 10 10     METs 2.2 4 2.8 3.8      Arm Ergometer   Level 25 25  -  -    Minutes 10 10  -  -    METs 2.57 2.6  -  -      Track   Laps  -  - 13 7    Minutes  -  - 10 10    METs  -  - 3.26 2.23      Home Exercise Plan   Plans to continue exercise at  - Home (comment)  walking Home (comment)  walking Home (comment)  walking    Frequency  - Add 3 additional days to program exercise sessions. Add 3  additional days to program exercise sessions. Add 3 additional days to program exercise sessions.    Initial Home Exercises Provided  - 12/02/16 12/02/16 12/02/16       Exercise Comments:      Exercise Comments    Row Name 11/25/16 1430 01/16/17 1248         Exercise Comments Pt was oriented to exercise equipment on 11/22/16. Pt did well with first session; will continue to monitor. Reviewed METs and goals. Pt is doing very well in cardiac rehab. Will continue to monitor pt's activity levels and progress.         Exercise Goals and Review:      Exercise Goals    Row Name 11/14/16 1139             Exercise Goals   Increase Physical Activity Yes       Intervention Provide advice, education, support and counseling about physical activity/exercise needs.;Develop an individualized exercise prescription for aerobic and resistive training based on initial evaluation findings, risk stratification, comorbidities and participant's personal goals.       Expected  Outcomes Achievement of increased cardiorespiratory fitness and enhanced flexibility, muscular endurance and strength shown through measurements of functional capacity and personal statement of participant.       Increase Strength and Stamina Yes       Intervention Provide advice, education, support and counseling about physical activity/exercise needs.;Develop an individualized exercise prescription for aerobic and resistive training based on initial evaluation findings, risk stratification, comorbidities and participant's personal goals.       Expected Outcomes Achievement of increased cardiorespiratory fitness and enhanced flexibility, muscular endurance and strength shown through measurements of functional capacity and personal statement of participant.          Exercise Goals Re-Evaluation :     Exercise Goals Re-Evaluation    [place] Name 12/03/16 1038 01/16/17 1249           Exercise Goal Re-Evaluation   Exercise Goals  Review Increase Strenth and Stamina Increase Physical Activity;Able to understand and use rate of perceived exertion (RPE) scale;Knowledge and understanding of Target Heart Rate Range (THRR);Understanding of Exercise Prescription;Increase Strength and Stamina      Comments Reviewed home exercise with pt today.  Pt plans to walk for exercise, 2x/week in addition to coming to cardiac rehab.  Reviewed THR, pulse, RPE, sign and symptoms, NTG use, and when to call 911 or MD.  Also discussed weather considerations and indoor options.  Pt voiced understanding. Pt is compliant with HEP. Pt was on family vacation in [place] and was able to walk ~74miles without SOB or fatigue.      Expected Outcomes Pt will be compliant with HEP and will improve in cardiorespiratory fitness. Pt will be compliant with HEP and will improve in cardiorespiratory fitness.          Discharge Exercise Prescription (Final Exercise Prescription Changes):     Exercise Prescription Changes - 01/16/17 1200      Response to Exercise   Blood Pressure (Admit) 122/70   Blood Pressure (Exercise) 150/80   Blood Pressure (Exit) 136/70   Heart Rate (Admit) 54 bpm   Heart Rate (Exercise) 88 bpm   Heart Rate (Exit) 54 bpm   Rating of Perceived Exertion (Exercise) 11   Symptoms none   Duration Continue with 30 min of aerobic exercise without signs/symptoms of physical distress.   Intensity THRR unchanged     Progression   Progression Continue to progress workloads to maintain intensity without signs/symptoms of physical distress.   Average METs 3.1     Resistance Training   Training Prescription Yes   Weight 4lbs   Reps 10-15   Time 10 Minutes     Recumbant Bike   Level 2.5   Minutes 10   METs 3.2     NuStep   Level 3   SPM 80   Minutes 10   METs 3.8     Track   Laps 7   Minutes 10   METs 2.23     Home Exercise Plan   Plans to continue exercise at Home (comment)  walking   Frequency Add 3 additional days to  program exercise sessions.   Initial Home Exercises Provided 12/02/16      Nutrition:  Target Goals: Understanding of nutrition guidelines, daily intake of sodium 1500mg , cholesterol 200mg , calories 30% from fat and 7% or less from saturated fats, daily to have 5 or more servings of fruits and vegetables.  Biometrics:     Pre Biometrics - 11/14/16 1140      Pre Biometrics  Waist Circumference 40.5 inches   Hip Circumference 39.5 inches   Waist to Hip Ratio 1.03 %   Triceps Skinfold 12 mm   % Body Fat 26.5 %   Grip Strength 36 kg   Flexibility 11 in   Single Leg Stand 9.3 seconds       Nutrition Therapy Plan and Nutrition Goals:     Nutrition Therapy & Goals - 11/15/16 0830      Nutrition Therapy   Diet Therapeutic Lifestyle Changes     Intervention Plan   Intervention Prescribe, educate and counsel regarding individualized specific dietary modifications aiming towards targeted core components such as weight, hypertension, lipid management, diabetes, heart failure and other comorbidities.   Expected Outcomes Short Term Goal: Understand basic principles of dietary content, such as calories, fat, sodium, cholesterol and nutrients.;Long Term Goal: Adherence to prescribed nutrition plan.      Nutrition Discharge: Nutrition Scores:     Nutrition Assessments - 11/15/16 0830      MEDFICTS Scores   Pre Score 12      Nutrition Goals Re-Evaluation:   Nutrition Goals Re-Evaluation:   Nutrition Goals Discharge (Final Nutrition Goals Re-Evaluation):   Psychosocial: Target Goals: Acknowledge presence or absence of significant depression and/or stress, maximize coping skills, provide positive support system. Participant is able to verbalize types and ability to use techniques and skills needed for reducing stress and depression.  Initial Review & Psychosocial Screening:     Initial Psych Review & Screening - 11/14/16 1214      Initial Review   Current issues  with None Identified     Family Dynamics   Good Support System? Yes     Barriers   Psychosocial barriers to participate in program There are no identifiable barriers or psychosocial needs.      Quality of Life Scores:     Quality of Life - 11/14/16 1142      Quality of Life Scores   Health/Function Pre (P)  22.6 %   Socioeconomic Pre (P)  24.38 %   Psych/Spiritual Pre (P)  24.86 %   Family Pre (P)  26.25 %      PHQ-9: Recent Review Flowsheet Data    Depression screen [facility] 2/9 11/22/2016   Decreased Interest 0   Down, Depressed, Hopeless 0   PHQ - 2 Score 0     Interpretation of Total Score  Total Score Depression Severity:  1-4 = Minimal depression, 5-9 = Mild depression, 10-14 = Moderate depression, 15-19 = Moderately severe depression, 20-27 = Severe depression   Psychosocial Evaluation and Intervention:     Psychosocial Evaluation - 01/21/17 2227      Psychosocial Evaluation & Interventions   Interventions Encouraged to exercise with the program and follow exercise prescription;Relaxation education;Stress management education   Comments pt with supportive family.   Expected Outcomes Pt will continue to exhibit positive and healthy coping skills.    Continue Psychosocial Services  No Follow up required      Psychosocial Re-Evaluation:     Psychosocial Re-Evaluation    [place] Name 11/24/16 1627 01/21/17 2227           Psychosocial Re-Evaluation   Current issues with None Identified None Identified      Interventions Relaxation education;Stress management education;Encouraged to attend Cardiac Rehabilitation for the exercise Relaxation education;Stress management education;Encouraged to attend Cardiac Rehabilitation for the exercise      Continue Psychosocial Services  Follow up required by staff No Follow up required  Psychosocial Discharge (Final Psychosocial Re-Evaluation):     Psychosocial Re-Evaluation - 01/21/17 2227      Psychosocial  Re-Evaluation   Current issues with None Identified   Interventions Relaxation education;Stress management education;Encouraged to attend Cardiac Rehabilitation for the exercise   Continue Psychosocial Services  No Follow up required      Vocational Rehabilitation: Provide vocational rehab assistance to qualifying candidates.   Vocational Rehab Evaluation & Intervention:     Vocational Rehab - 11/14/16 1215      Initial Vocational Rehab Evaluation & Intervention   Assessment shows need for Vocational Rehabilitation No  pt is retired      Education: Education Goals: Education classes will be provided on a weekly basis, covering required topics. Participant will state understanding/return demonstration of topics presented.  Learning Barriers/Preferences:     Learning Barriers/Preferences - 11/14/16 0910      Learning Barriers/Preferences   Learning Barriers None   Learning Preferences Verbal Instruction;Skilled Demonstration;Individual Instruction;Audio      Education Topics: Count Your Pulse:  -Group instruction provided by verbal instruction, demonstration, patient participation and written materials to support subject.  Instructors address importance of being able to find your pulse and how to count your pulse when at home without a heart monitor.  Patients get hands on experience counting their pulse with staff help and individually.   CARDIAC REHAB PHASE II EXERCISE from 01/17/2017 in [place]  Date  01/17/17  Instruction Review Code  2- meets goals/outcomes      Heart Attack, Angina, and Risk Factor Modification:  -Group instruction provided by verbal instruction, video, and written materials to support subject.  Instructors address signs and symptoms of angina and heart attacks.    Also discuss risk factors for heart disease and how to make changes to improve heart health risk factors.   Functional Fitness:  -Group instruction  provided by verbal instruction, demonstration, patient participation, and written materials to support subject.  Instructors address safety measures for doing things around the house.  Discuss how to get up and down off the floor, how to pick things up properly, how to safely get out of a chair without assistance, and balance training.   CARDIAC REHAB PHASE II EXERCISE from 01/17/2017 in [place]  Date  12/27/16  Educator  ep  Instruction Review Code  2- meets goals/outcomes      Meditation and Mindfulness:  -Group instruction provided by verbal instruction, patient participation, and written materials to support subject.  Instructor addresses importance of mindfulness and meditation practice to help reduce stress and improve awareness.  Instructor also leads participants through a meditation exercise.    CARDIAC REHAB PHASE II EXERCISE from 01/17/2017 in [place]  Date  12/25/16  Instruction Review Code  2- meets goals/outcomes      Stretching for Flexibility and Mobility:  -Group instruction provided by verbal instruction, patient participation, and written materials to support subject.  Instructors lead participants through series of stretches that are designed to increase flexibility thus improving mobility.  These stretches are additional exercise for major muscle groups that are typically performed during regular warm up and cool down.   CARDIAC REHAB PHASE II EXERCISE from 01/17/2017 in [place]  Date  01/03/17  Educator  EP  Instruction Review Code  R- Review/reinforce      Hands Only CPR:  -Group verbal, video, and participation provides a basic overview  of AHA guidelines for community CPR. Role-play of emergencies allow participants the opportunity to practice calling for help and chest compression technique with discussion of AED use.   Hypertension: -Group verbal and written  instruction that provides a basic overview of hypertension including the most recent diagnostic guidelines, risk factor reduction with self-care instructions and medication management.    Nutrition I class: Heart Healthy Eating:  -Group instruction provided by PowerPoint slides, verbal discussion, and written materials to support subject matter. The instructor gives an explanation and review of the Therapeutic Lifestyle Changes diet recommendations, which includes a discussion on lipid goals, dietary fat, sodium, fiber, plant stanol/sterol esters, sugar, and the components of a well-balanced, healthy diet.   CARDIAC REHAB PHASE II EXERCISE from 01/17/2017 in [place]  Date  11/14/16  Educator  RD  Instruction Review Code  Not applicable [class handouts given]      Nutrition II class: Lifestyle Skills:  -Group instruction provided by PowerPoint slides, verbal discussion, and written materials to support subject matter. The instructor gives an explanation and review of label reading, grocery shopping for heart health, heart healthy recipe modifications, and ways to make healthier choices when eating out.   CARDIAC REHAB PHASE II EXERCISE from 01/17/2017 in [place]  Date  11/14/16  Educator  RD  Instruction Review Code  Not applicable [class handouts given]      Diabetes Question & Answer:  -Group instruction provided by PowerPoint slides, verbal discussion, and written materials to support subject matter. The instructor gives an explanation and review of diabetes co-morbidities, pre- and post-prandial blood glucose goals, pre-exercise blood glucose goals, signs, symptoms, and treatment of hypoglycemia and hyperglycemia, and foot care basics.   CARDIAC REHAB PHASE II EXERCISE from 01/17/2017 in [place]  Date  12/13/16  Educator  RD  Instruction Review Code  2- meets goals/outcomes       Diabetes Blitz:  -Group instruction provided by PowerPoint slides, verbal discussion, and written materials to support subject matter. The instructor gives an explanation and review of the physiology behind type 1 and type 2 diabetes, diabetes medications and rational behind using different medications, pre- and post-prandial blood glucose recommendations and Hemoglobin A1c goals, diabetes diet, and exercise including blood glucose guidelines for exercising safely.    Portion Distortion:  -Group instruction provided by PowerPoint slides, verbal discussion, written materials, and food models to support subject matter. The instructor gives an explanation of serving size versus portion size, changes in portions sizes over the last 20 years, and what consists of a serving from each food group.   CARDIAC REHAB PHASE II EXERCISE from 01/17/2017 in [place]  Date  01/01/17  Educator  RD  Instruction Review Code  2- meets goals/outcomes      Stress Management:  -Group instruction provided by verbal instruction, video, and written materials to support subject matter.  Instructors review role of stress in heart disease and how to cope with stress positively.     Exercising on Your Own:  -Group instruction provided by verbal instruction, power point, and written materials to support subject.  Instructors discuss benefits of exercise, components of exercise, frequency and intensity of exercise, and end points for exercise.  Also discuss use of nitroglycerin and activating EMS.  Review options of places to exercise outside of rehab.  Review guidelines for sex with heart disease.   CARDIAC REHAB PHASE II EXERCISE  from 01/17/2017 in [place]  Date  01/15/17  Instruction Review Code  2- meets goals/outcomes      Cardiac Drugs I:  -Group instruction provided by verbal instruction and written materials to support subject.  Instructor  reviews cardiac drug classes: antiplatelets, anticoagulants, beta blockers, and statins.  Instructor discusses reasons, side effects, and lifestyle considerations for each drug class.   CARDIAC REHAB PHASE II EXERCISE from 01/17/2017 in [place]  Date  12/18/16  Instruction Review Code  2- meets goals/outcomes      Cardiac Drugs II:  -Group instruction provided by verbal instruction and written materials to support subject.  Instructor reviews cardiac drug classes: angiotensin converting enzyme inhibitors (ACE-I), angiotensin II receptor blockers (ARBs), nitrates, and calcium channel blockers.  Instructor discusses reasons, side effects, and lifestyle considerations for each drug class.   Anatomy and Physiology of the Circulatory System:  Group verbal and written instruction and models provide basic cardiac anatomy and physiology, with the coronary electrical and arterial systems. Review of: AMI, Angina, Valve disease, Heart Failure, Peripheral Artery Disease, Cardiac Arrhythmia, Pacemakers, and the ICD.   CARDIAC REHAB PHASE II EXERCISE from 01/17/2017 in [place]  Date  12/11/16  Educator  RN  Instruction Review Code  2- meets goals/outcomes      Other Education:  -Group or individual verbal, written, or video instructions that support the educational goals of the cardiac rehab program.   Knowledge Questionnaire Score:     Knowledge Questionnaire Score - 11/14/16 1129      Knowledge Questionnaire Score   Pre Score 13/24      Core Components/Risk Factors/Patient Goals at Admission:     Personal Goals and Risk Factors at Admission - 11/14/16 1141      Core Components/Risk Factors/Patient Goals on Admission   Diabetes Yes   Intervention Provide education about signs/symptoms and action to take for hypo/hyperglycemia.;Provide education about proper nutrition, including hydration, and aerobic/resistive exercise  prescription along with prescribed medications to achieve blood glucose in normal ranges: Fasting glucose 65-99 mg/dL   Expected Outcomes Short Term: Participant verbalizes understanding of the signs/symptoms and immediate care of hyper/hypoglycemia, proper foot care and importance of medication, aerobic/resistive exercise and nutrition plan for blood glucose control.;Long Term: Attainment of HbA1C < 7%.   Lipids Yes   Intervention Provide education and support for participant on nutrition & aerobic/resistive exercise along with prescribed medications to achieve LDL 70mg , HDL >40mg .   Expected Outcomes Short Term: Participant states understanding of desired cholesterol values and is compliant with medications prescribed. Participant is following exercise prescription and nutrition guidelines.;Long Term: Cholesterol controlled with medications as prescribed, with individualized exercise RX and with personalized nutrition plan. Value goals: LDL < 70mg , HDL > 40 mg.      Core Components/Risk Factors/Patient Goals Review:      Goals and Risk Factor Review    Row Name 11/24/16 1623 12/23/16 1532 01/21/17 2226         Core Components/Risk Factors/Patient Goals Review   Personal Goals Review Diabetes;Hypertension Diabetes;Hypertension Diabetes;Hypertension     Review Pt is off to a good start toward achieving his desired goals.  Continue to monitor his progress. Pt is off to a good start toward achieving the desired goals of diabetes and bp readings within normal limits. Continue to monitor his progress. Pt  continues to make progress toward achieving the desired goals of diabetes and bp readings within normal  limits. Continue to monitor his progress.     Expected Outcomes Pt will achieve his desired goals with improved diabetes management and blood pressure readings within normal limits. Pt will achieve his desired goals with improved diabetes management and blood pressure readings within normal  limits. Pt will achieve his desired goals with improved diabetes management and blood pressure readings within normal limits.        Core Components/Risk Factors/Patient Goals at Discharge (Final Review):      Goals and Risk Factor Review - 01/21/17 2226      Core Components/Risk Factors/Patient Goals Review   Personal Goals Review Diabetes;Hypertension   Review Pt  continues to make progress toward achieving the desired goals of diabetes and bp readings within normal limits. Continue to monitor his progress.   Expected Outcomes Pt will achieve his desired goals with improved diabetes management and blood pressure readings within normal limits.      ITP Comments:     ITP Comments    Row Name 11/14/16 0914           ITP Comments Dr. Fransico Him, Medical Director          Comments:  Rush Landmark is making expected progress toward personal goals after completing 20 essions.  Pt with brief absence due to gout flare up. Pt able to pick back up with exercise and remains on track with his goals. Psychosocial Assessmentreveals no barriers to participating in rehab. Pt is happy go lucky and demonstrates a healthy and positive outlook on life. Pt feels supported in his rehab participation by his family. Pt desires to get his strength and stamina back which he feels he has returned to baseline. Pt interacts with fellow participants and is engaging with rehab staff.  Recommend continued exercise and life style modification education including  stress management and relaxation techniques to decrease cardiac risk profile. Cherre Huger, BSN Cardiac and Training and development officer

## 2017-01-22 ENCOUNTER — Encounter (HOSPITAL_COMMUNITY): Payer: Medicare Other

## 2017-01-22 ENCOUNTER — Encounter (HOSPITAL_COMMUNITY)
Admission: RE | Admit: 2017-01-22 | Discharge: 2017-01-22 | Disposition: A | Discharge status: Home / Self Care | Payer: Medicare Other | Source: Ambulatory Visit | Attending: Cardiology | Admitting: Cardiology

## 2017-01-22 DIAGNOSIS — Z955 Presence of coronary angioplasty implant and graft: Secondary | ICD-10-CM

## 2017-01-24 ENCOUNTER — Encounter (HOSPITAL_COMMUNITY): Payer: Medicare Other

## 2017-01-24 ENCOUNTER — Encounter (HOSPITAL_COMMUNITY)
Admission: RE | Admit: 2017-01-24 | Discharge: 2017-01-24 | Disposition: A | Discharge status: Home / Self Care | Payer: Medicare Other | Source: Ambulatory Visit | Attending: Cardiology | Admitting: Cardiology

## 2017-01-24 DIAGNOSIS — Z955 Presence of coronary angioplasty implant and graft: Secondary | ICD-10-CM | POA: Diagnosis not present

## 2017-01-27 ENCOUNTER — Encounter (HOSPITAL_COMMUNITY)
Admission: RE | Admit: 2017-01-27 | Discharge: 2017-01-27 | Disposition: A | Discharge status: Home / Self Care | Payer: Medicare Other | Source: Ambulatory Visit | Attending: Cardiology | Admitting: Cardiology

## 2017-01-27 ENCOUNTER — Encounter (HOSPITAL_COMMUNITY): Payer: Medicare Other

## 2017-01-27 DIAGNOSIS — Z955 Presence of coronary angioplasty implant and graft: Secondary | ICD-10-CM | POA: Diagnosis not present

## 2017-01-29 ENCOUNTER — Encounter (HOSPITAL_COMMUNITY)
Admission: RE | Admit: 2017-01-29 | Discharge: 2017-01-29 | Disposition: A | Discharge status: Home / Self Care | Payer: Medicare Other | Source: Ambulatory Visit | Attending: Cardiology | Admitting: Cardiology

## 2017-01-29 ENCOUNTER — Encounter (HOSPITAL_COMMUNITY): Payer: Medicare Other

## 2017-01-29 DIAGNOSIS — Z955 Presence of coronary angioplasty implant and graft: Secondary | ICD-10-CM

## 2017-01-31 ENCOUNTER — Encounter (HOSPITAL_COMMUNITY): Payer: Medicare Other

## 2017-02-03 ENCOUNTER — Encounter (HOSPITAL_COMMUNITY): Payer: Medicare Other

## 2017-02-03 ENCOUNTER — Encounter (HOSPITAL_COMMUNITY)
Admission: RE | Admit: 2017-02-03 | Discharge: 2017-02-03 | Disposition: A | Discharge status: Home / Self Care | Payer: Medicare Other | Source: Ambulatory Visit | Attending: Cardiology | Admitting: Cardiology

## 2017-02-03 DIAGNOSIS — Z955 Presence of coronary angioplasty implant and graft: Secondary | ICD-10-CM

## 2017-02-05 ENCOUNTER — Encounter (HOSPITAL_COMMUNITY): Payer: Medicare Other

## 2017-02-05 ENCOUNTER — Encounter (HOSPITAL_COMMUNITY)
Admission: RE | Admit: 2017-02-05 | Discharge: 2017-02-05 | Disposition: A | Discharge status: Home / Self Care | Payer: Medicare Other | Source: Ambulatory Visit | Attending: Cardiology | Admitting: Cardiology

## 2017-02-05 DIAGNOSIS — Z955 Presence of coronary angioplasty implant and graft: Secondary | ICD-10-CM

## 2017-02-07 ENCOUNTER — Encounter (HOSPITAL_COMMUNITY)
Admission: RE | Admit: 2017-02-07 | Discharge: 2017-02-07 | Disposition: A | Discharge status: Home / Self Care | Payer: Medicare Other | Source: Ambulatory Visit | Attending: Cardiology | Admitting: Cardiology

## 2017-02-07 ENCOUNTER — Encounter (HOSPITAL_COMMUNITY): Payer: Medicare Other

## 2017-02-07 DIAGNOSIS — Z955 Presence of coronary angioplasty implant and graft: Secondary | ICD-10-CM

## 2017-02-10 ENCOUNTER — Encounter (HOSPITAL_COMMUNITY)
Admission: RE | Admit: 2017-02-10 | Discharge: 2017-02-10 | Disposition: A | Discharge status: Home / Self Care | Payer: Medicare Other | Source: Ambulatory Visit | Attending: Cardiology | Admitting: Cardiology

## 2017-02-10 ENCOUNTER — Encounter (HOSPITAL_COMMUNITY): Payer: Medicare Other

## 2017-02-10 DIAGNOSIS — I214 Non-ST elevation (NSTEMI) myocardial infarction: Secondary | ICD-10-CM | POA: Insufficient documentation

## 2017-02-10 DIAGNOSIS — Z955 Presence of coronary angioplasty implant and graft: Secondary | ICD-10-CM | POA: Insufficient documentation

## 2017-02-12 ENCOUNTER — Encounter (HOSPITAL_COMMUNITY): Payer: Medicare Other

## 2017-02-12 ENCOUNTER — Encounter (HOSPITAL_COMMUNITY)
Admission: RE | Admit: 2017-02-12 | Discharge: 2017-02-12 | Disposition: A | Discharge status: Home / Self Care | Payer: Medicare Other | Source: Ambulatory Visit | Attending: Cardiology | Admitting: Cardiology

## 2017-02-12 DIAGNOSIS — Z955 Presence of coronary angioplasty implant and graft: Secondary | ICD-10-CM | POA: Diagnosis not present

## 2017-02-12 LAB — GLUCOSE, CAPILLARY: GLUCOSE-CAPILLARY: 132 mg/dL — AB (ref 65–99)

## 2017-02-14 ENCOUNTER — Encounter (HOSPITAL_COMMUNITY)
Admission: RE | Admit: 2017-02-14 | Discharge: 2017-02-14 | Disposition: A | Discharge status: Home / Self Care | Payer: Medicare Other | Source: Ambulatory Visit | Attending: Cardiology | Admitting: Cardiology

## 2017-02-14 ENCOUNTER — Encounter (HOSPITAL_COMMUNITY): Payer: Medicare Other

## 2017-02-14 VITALS — Ht 70.25 in | Wt 182.3 lb

## 2017-02-14 DIAGNOSIS — Z955 Presence of coronary angioplasty implant and graft: Secondary | ICD-10-CM | POA: Diagnosis not present

## 2017-02-16 ENCOUNTER — Encounter (HOSPITAL_COMMUNITY): Payer: Self-pay

## 2017-02-16 NOTE — Progress Notes (Signed)
Cardiac Individual Treatment Plan  Patient Details  Name: [name] MRN: [id number] Date of Birth: May 20, 1940 Referring Provider:     CARDIAC REHAB PHASE II ORIENTATION from 11/14/2016 in [place]  Referring Provider  Kela Millin MD      Initial Encounter Date:    CARDIAC REHAB PHASE II ORIENTATION from 11/14/2016 in [place]  Date  11/14/16  Referring Provider  Kela Millin MD      Visit Diagnosis: Status post coronary artery stent placement  Patient's Home Medications on Admission:  Current Outpatient Prescriptions:  .  acetaminophen (TYLENOL) 500 MG tablet, Take 1,000 mg by mouth every 6 (six) hours as needed for moderate pain., Disp: , Rfl:  .  aspirin EC 81 MG tablet, Take 81 mg by mouth daily., Disp: , Rfl:  .  atorvastatin (LIPITOR) 80 MG tablet, Take 1 tablet (80 mg total) by mouth daily at 6 PM., Disp: 30 tablet, Rfl: 1 .  BYSTOLIC 20 MG TABS, Take 10 mg by mouth daily. daily, Disp: , Rfl:  .  glyBURIDE (DIABETA) 2.5 MG tablet, Take 1 tablet (2.5 mg total) by mouth daily with breakfast., Disp: 30 tablet, Rfl: 0 .  isosorbide mononitrate (IMDUR) 60 MG 24 hr tablet, Take 1 tablet (60 mg total) by mouth daily., Disp: 30 tablet, Rfl: 1 .  Misc Natural Products (TART CHERRY ADVANCED PO), Take 1 tablet by mouth daily., Disp: , Rfl:  .  nitroGLYCERIN (NITROSTAT) 0.4 MG SL tablet, Place 1 tablet (0.4 mg total) under the tongue every 5 (five) minutes x 3 doses as needed for chest pain., Disp: 25 tablet, Rfl: 1 .  Olmesartan-Amlodipine-HCTZ 20-5-12.5 MG TABS, Take by mouth., Disp: , Rfl:  .  Tetrahydrozoline HCl (VISINE OP), Place 1 drop into both eyes daily as needed (dry eyes)., Disp: , Rfl:  .  ticagrelor (BRILINTA) 90 MG TABS tablet, Take 1 tablet (90 mg total) by mouth 2 (two) times daily., Disp: 60 tablet, Rfl: 0  Past Medical History: Past Medical History:  Diagnosis Date  . Arthritis     "little in my right hand; some in my right big toe" (10/22/2016)  . Borderline diabetes    "tx'd w/RX; blood surgars dropped too low; dr. dc'd it 09/25/2016; blood sugars fine since then" (10/22/2016)  . Chronic kidney disease    S/P right nephrectomy in 1984  . History of gout 04/2016; 09/2016   RLE; RLE  . History of kidney stones   . Hyperlipidemia   . Hypertension   . MGUS (monoclonal gammopathy of unknown significance)   . Migraine    "none in years" (10/22/2016)  . Myeloma ([place])    "dormant for the last 2-3 years" (10/22/2016)  . NSTEMI (non-ST elevated myocardial infarction) ([place]) 09/25/2016  . 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 Prostate cancer ([place]) 2006   s/p brachytherapy  . Seborrheic keratosis     Tobacco Use: History  Smoking Status  . Never Smoker  Smokeless Tobacco  . Never Used    Labs: Recent Review Flowsheet Data    Labs for ITP Cardiac and Pulmonary Rehab Latest Ref Rng & Units 09/25/2016   Cholestrol 0 - 200 mg/dL 199   LDLCALC 0 - 99 mg/dL 111(H)   HDL >40 mg/dL 41   Trlycerides <150 mg/dL 233(H)   Hemoglobin A1c 4.8 - 5.6 % 7.5(H)      Capillary Blood Glucose: Lab Results  Component Value Date   GLUCAP 132 (H) 02/12/2017   GLUCAP 265 (H) 01/17/2017   GLUCAP 151 (H) 01/15/2017   GLUCAP 122 (H) 01/01/2017   GLUCAP 136 (H) 12/27/2016     Exercise Target Goals:    Exercise Program Goal: Individual exercise prescription set with THRR, safety & activity barriers. Participant demonstrates ability to understand and report RPE using BORG scale, to self-measure pulse accurately, and to acknowledge the importance of the exercise prescription.  Exercise Prescription Goal: Starting with aerobic activity 30 plus minutes a day, 3 days per week for initial exercise prescription. Provide home exercise prescription and guidelines that participant acknowledges understanding prior to discharge.  Activity Barriers & Risk  Stratification:     Activity Barriers & Cardiac Risk Stratification - 11/14/16 0914      Activity Barriers & Cardiac Risk Stratification   Activity Barriers Left Knee Replacement   Cardiac Risk Stratification Moderate      6 Minute Walk:     6 Minute Walk    Row Name 11/14/16 1131 02/14/17 1414       6 Minute Walk   Phase Initial Discharge    Distance 1300 feet 1566 feet    Distance % Change  - 20.46 %    Distance Feet Change  - 266 ft    Walk Time 6 minutes 6 minutes    # of Rest Breaks 0 0    MPH 2.5 2.96    METS 2.4 2.94    RPE 11 11    VO2 Peak 8.6 10.31    Symptoms No No    Resting HR 69 bpm 55 bpm    Resting BP 122/78 114/70    Max Ex. HR 75 bpm 79 bpm    Max Ex. BP 138/50 134/70    2 Minute Post BP 134/64 110/60       Oxygen Initial Assessment:   Oxygen Re-Evaluation:   Oxygen Discharge (Final Oxygen Re-Evaluation):   Initial Exercise Prescription:     Initial Exercise Prescription - 11/14/16 1100      Date of Initial Exercise RX and Referring Provider   Date 11/14/16   Referring Provider Kela Millin MD     Recumbant Bike   Level 1.5   Minutes 10   METs 2     NuStep   Level 2   SPM 80   Minutes 10   METs 2     Arm Ergometer   Level 25   Minutes 10   METs 2.2     Prescription Details   Frequency (times per week) 3   Duration Progress to 45 minutes of aerobic exercise without signs/symptoms of physical distress     Intensity   THRR 40-80% of Max Heartrate 57-114   Ratings of Perceived Exertion 11-13   Perceived Dyspnea 0-4     Progression   Progression Continue to progress workloads to maintain intensity without signs/symptoms of physical distress.     Resistance Training   Training Prescription Yes   Weight 2   Reps 10-15      Perform Capillary Blood Glucose checks as needed.  Exercise Prescription Changes:      Exercise Prescription Changes    Row Name 11/25/16 1400 12/18/16 1241 01/03/17 1241 01/16/17 1200  01/27/17 1501     Response to Exercise   Blood Pressure (Admit) 114/72 130/64 120/70 122/70 130/76   Blood Pressure (Exercise) 142/72 144/62 120/70 150/80 142/80   Blood Pressure (Exit) 118/60 108/70 122/80 136/70  124/64   Heart Rate (Admit) 65 bpm 54 bpm 55 bpm 54 bpm 48 bpm   Heart Rate (Exercise) 104 bpm 96 bpm 90 bpm 88 bpm 78 bpm   Heart Rate (Exit) 65 bpm 66 bpm 55 bpm 54 bpm 48 bpm   Rating of Perceived Exertion (Exercise) 12 13 13 11 13    Symptoms none none none none none   Comments pt was oriented to exercise equipment on 11/22/16. Pt did well with exercise sesion  -  -  -  -   Duration Continue with 30 min of aerobic exercise without signs/symptoms of physical distress. Continue with 30 min of aerobic exercise without signs/symptoms of physical distress. Continue with 30 min of aerobic exercise without signs/symptoms of physical distress. Continue with 30 min of aerobic exercise without signs/symptoms of physical distress. Continue with 30 min of aerobic exercise without signs/symptoms of physical distress.   Intensity THRR unchanged THRR unchanged THRR unchanged THRR unchanged THRR unchanged     Progression   Progression  - Continue to progress workloads to maintain intensity without signs/symptoms of physical distress. Continue to progress workloads to maintain intensity without signs/symptoms of physical distress. Continue to progress workloads to maintain intensity without signs/symptoms of physical distress. Continue to progress workloads to maintain intensity without signs/symptoms of physical distress.   Average METs  - 3.2 3.1 3.1 4.3     Resistance Training   Training Prescription Yes Yes Yes Yes Yes   Weight 2 4lbs 4lbs 4lbs 5lbs   Reps 10-15 10-15 10-15 10-15 10-15   Time 10 Minutes 10 Minutes 10 Minutes 10 Minutes 10 Minutes     Recumbant Bike   Level 1.5 2.5 2.5 2.5 3.3   Minutes 10 10 10 10 10    METs 2 3.1 3.2 3.2 4.6     NuStep   Level 2 3 3 3 5    SPM 80 100 80  80 80   Minutes 10 10 10 10 10    METs 2.2 4 2.8 3.8 3.8     Arm Ergometer   Level 25 25  -  -  -   Minutes 10 10  -  -  -   METs 2.57 2.6  -  -  -     Track   Laps  -  - 13 7 20    Minutes  -  - 10 10 10    METs  -  - 3.26 2.23 4.49     Home Exercise Plan   Plans to continue exercise at  - Home (comment)  walking Home (comment)  walking Home (comment)  walking Home (comment)  walking   Frequency  - Add 3 additional days to program exercise sessions. Add 3 additional days to program exercise sessions. Add 3 additional days to program exercise sessions. Add 3 additional days to program exercise sessions.   Initial Home Exercises Provided  - 12/02/16 12/02/16 12/02/16 12/02/16   Row Name 02/12/17 1400             Response to Exercise   Blood Pressure (Admit) 118/60       Blood Pressure (Exercise) 142/60       Blood Pressure (Exit) 102/60       Heart Rate (Admit) 50 bpm       Heart Rate (Exercise) 86 bpm       Heart Rate (Exit) 59 bpm       Rating of Perceived Exertion (Exercise) 13  Symptoms none       Duration Continue with 30 min of aerobic exercise without signs/symptoms of physical distress.       Intensity THRR unchanged         Progression   Progression Continue to progress workloads to maintain intensity without signs/symptoms of physical distress.       Average METs 4.1         Resistance Training   Training Prescription Yes       Weight 4lbs       Reps 10-15       Time 10 Minutes         Recumbant Bike   Level 3.3       Minutes 15       METs 4.1         NuStep   Level 3       SPM 90       Minutes 15       METs 4.2         Home Exercise Plan   Plans to continue exercise at Home (comment)  walking       Frequency Add 3 additional days to program exercise sessions.       Initial Home Exercises Provided 12/02/16          Exercise Comments:      Exercise Comments    Row Name 11/25/16 1430 01/16/17 1248 02/18/17 1536       Exercise  Comments Pt was oriented to exercise equipment on 11/22/16. Pt did well with first session; will continue to monitor. Reviewed METs and goals. Pt is doing very well in cardiac rehab. Will continue to monitor pt's activity levels and progress. Reviewed METs and goals. Pt is doing very well in cardiac rehab. Will continue to monitor pt's activity levels and progress.        Exercise Goals and Review:      Exercise Goals    Row Name 11/14/16 1139             Exercise Goals   Increase Physical Activity Yes       Intervention Provide advice, education, support and counseling about physical activity/exercise needs.;Develop an individualized exercise prescription for aerobic and resistive training based on initial evaluation findings, risk stratification, comorbidities and participant's personal goals.       Expected Outcomes Achievement of increased cardiorespiratory fitness and enhanced flexibility, muscular endurance and strength shown through measurements of functional capacity and personal statement of participant.       Increase Strength and Stamina Yes       Intervention Provide advice, education, support and counseling about physical activity/exercise needs.;Develop an individualized exercise prescription for aerobic and resistive training based on initial evaluation findings, risk stratification, comorbidities and participant's personal goals.       Expected Outcomes Achievement of increased cardiorespiratory fitness and enhanced flexibility, muscular endurance and strength shown through measurements of functional capacity and personal statement of participant.          Exercise Goals Re-Evaluation :     Exercise Goals Re-Evaluation    Row Name 12/03/16 1038 01/16/17 1249 02/18/17 1536 02/18/17 1537       Exercise Goal Re-Evaluation   Exercise Goals Review Increase Strenth and Stamina Increase Physical Activity;Able to understand and use rate of perceived exertion (RPE)  scale;Knowledge and understanding of Target Heart Rate Range (THRR);Understanding of Exercise Prescription;Increase Strength and Stamina Increase Physical Activity;Able to understand and use rate of perceived exertion (  RPE) scale;Knowledge and understanding of Target Heart Rate Range (THRR);Understanding of Exercise Prescription;Increase Strength and Stamina  -    Comments Reviewed home exercise with pt today.  Pt plans to walk for exercise, 2x/week in addition to coming to cardiac rehab.  Reviewed THR, pulse, RPE, sign and symptoms, NTG use, and when to call 911 or MD.  Also discussed weather considerations and indoor options.  Pt voiced understanding. Pt is compliant with HEP. Pt was on family vacation in [place] and was able to walk ~45miles without SOB or fatigue. Pt has made great progress in cardiac rehab and Pt is making great progress in cardiac rehab and has increased walking distance from walk test, pre/post, by 277 ft.     Expected Outcomes Pt will be compliant with HEP and will improve in cardiorespiratory fitness. Pt will be compliant with HEP and will improve in cardiorespiratory fitness.  - Pt will continue to improve in strength and cardiovascular endurance.         Discharge Exercise Prescription (Final Exercise Prescription Changes):     Exercise Prescription Changes - 02/12/17 1400      Response to Exercise   Blood Pressure (Admit) 118/60   Blood Pressure (Exercise) 142/60   Blood Pressure (Exit) 102/60   Heart Rate (Admit) 50 bpm   Heart Rate (Exercise) 86 bpm   Heart Rate (Exit) 59 bpm   Rating of Perceived Exertion (Exercise) 13   Symptoms none   Duration Continue with 30 min of aerobic exercise without signs/symptoms of physical distress.   Intensity THRR unchanged     Progression   Progression Continue to progress workloads to maintain intensity without signs/symptoms of physical distress.   Average METs 4.1     Resistance Training   Training Prescription Yes    Weight 4lbs   Reps 10-15   Time 10 Minutes     Recumbant Bike   Level 3.3   Minutes 15   METs 4.1     NuStep   Level 3   SPM 90   Minutes 15   METs 4.2     Home Exercise Plan   Plans to continue exercise at Home (comment)  walking   Frequency Add 3 additional days to program exercise sessions.   Initial Home Exercises Provided 12/02/16      Nutrition:  Target Goals: Understanding of nutrition guidelines, daily intake of sodium 1500mg , cholesterol 200mg , calories 30% from fat and 7% or less from saturated fats, daily to have 5 or more servings of fruits and vegetables.  Biometrics:     Pre Biometrics - 11/14/16 1140      Pre Biometrics   Waist Circumference 40.5 inches   Hip Circumference 39.5 inches   Waist to Hip Ratio 1.03 %   Triceps Skinfold 12 mm   % Body Fat 26.5 %   Grip Strength 36 kg   Flexibility 11 in   Single Leg Stand 9.3 seconds         Post Biometrics - 02/14/17 1415       Post  Biometrics   Height 5' 10.25" (1.784 m)   Weight 182 lb 5.1 oz (82.7 kg)   Waist Circumference 38.5 inches   Hip Circumference 38 inches   Waist to Hip Ratio 1.01 %   BMI (Calculated) 25.98   Triceps Skinfold 12 mm   % Body Fat 25.4 %   Grip Strength 33 kg   Flexibility 9.5 in   Single Leg Stand 12.75 seconds  Nutrition Therapy Plan and Nutrition Goals:     Nutrition Therapy & Goals - 11/15/16 0830      Nutrition Therapy   Diet Therapeutic Lifestyle Changes     Intervention Plan   Intervention Prescribe, educate and counsel regarding individualized specific dietary modifications aiming towards targeted core components such as weight, hypertension, lipid management, diabetes, heart failure and other comorbidities.   Expected Outcomes Short Term Goal: Understand basic principles of dietary content, such as calories, fat, sodium, cholesterol and nutrients.;Long Term Goal: Adherence to prescribed nutrition plan.      Nutrition Discharge: Nutrition  Scores:     Nutrition Assessments - 11/15/16 0830      MEDFICTS Scores   Pre Score 12      Nutrition Goals Re-Evaluation:   Nutrition Goals Re-Evaluation:   Nutrition Goals Discharge (Final Nutrition Goals Re-Evaluation):   Psychosocial: Target Goals: Acknowledge presence or absence of significant depression and/or stress, maximize coping skills, provide positive support system. Participant is able to verbalize types and ability to use techniques and skills needed for reducing stress and depression.  Initial Review & Psychosocial Screening:     Initial Psych Review & Screening - 11/14/16 1214      Initial Review   Current issues with None Identified     Family Dynamics   Good Support System? Yes     Barriers   Psychosocial barriers to participate in program There are no identifiable barriers or psychosocial needs.      Quality of Life Scores:     Quality of Life - 11/14/16 1142      Quality of Life Scores   Health/Function Pre (P)  22.6 %   Socioeconomic Pre (P)  24.38 %   Psych/Spiritual Pre (P)  24.86 %   Family Pre (P)  26.25 %      PHQ-9: Recent Review Flowsheet Data    Depression screen [facility] 2/9 11/22/2016   Decreased Interest 0   Down, Depressed, Hopeless 0   PHQ - 2 Score 0     Interpretation of Total Score  Total Score Depression Severity:  1-4 = Minimal depression, 5-9 = Mild depression, 10-14 = Moderate depression, 15-19 = Moderately severe depression, 20-27 = Severe depression   Psychosocial Evaluation and Intervention:     Psychosocial Evaluation - 02/16/17 1132      Psychosocial Evaluation & Interventions   Expected Outcomes Pt will continue to exhibit positive and healthy coping skills and contribute to the community in a meaningful way.      Psychosocial Re-Evaluation:     Psychosocial Re-Evaluation    [place] Name 11/24/16 1627 01/21/17 2227 02/16/17 1132         Psychosocial Re-Evaluation   Current issues with None  Identified None Identified None Identified     Comments  -  - Pt with a positve outlook and interacts well with fellow participants and rehab staff.     Expected Outcomes  -  - Pt will continue to exhibit positive and healthy coping skills and contribute to the community in a meaningful way.     Interventions Relaxation education;Stress management education;Encouraged to attend Cardiac Rehabilitation for the exercise Relaxation education;Stress management education;Encouraged to attend Cardiac Rehabilitation for the exercise Encouraged to attend Cardiac Rehabilitation for the exercise     Continue Psychosocial Services  Follow up required by staff No Follow up required No Follow up required        Psychosocial Discharge (Final Psychosocial Re-Evaluation):     Psychosocial  Re-Evaluation - 02/16/17 1132      Psychosocial Re-Evaluation   Current issues with None Identified   Comments Pt with a positve outlook and interacts well with fellow participants and rehab staff.   Expected Outcomes Pt will continue to exhibit positive and healthy coping skills and contribute to the community in a meaningful way.   Interventions Encouraged to attend Cardiac Rehabilitation for the exercise   Continue Psychosocial Services  No Follow up required      Vocational Rehabilitation: Provide vocational rehab assistance to qualifying candidates.   Vocational Rehab Evaluation & Intervention:     Vocational Rehab - 11/14/16 1215      Initial Vocational Rehab Evaluation & Intervention   Assessment shows need for Vocational Rehabilitation No  pt is retired      Education: Education Goals: Education classes will be provided on a weekly basis, covering required topics. Participant will state understanding/return demonstration of topics presented.  Learning Barriers/Preferences:     Learning Barriers/Preferences - 11/14/16 0910      Learning Barriers/Preferences   Learning Barriers None   Learning  Preferences Verbal Instruction;Skilled Demonstration;Individual Instruction;Audio      Education Topics: Count Your Pulse:  -Group instruction provided by verbal instruction, demonstration, patient participation and written materials to support subject.  Instructors address importance of being able to find your pulse and how to count your pulse when at home without a heart monitor.  Patients get hands on experience counting their pulse with staff help and individually.   CARDIAC REHAB PHASE II EXERCISE from 02/14/2017 in [place]  Date  01/17/17  Instruction Review Code  2- meets goals/outcomes      Heart Attack, Angina, and Risk Factor Modification:  -Group instruction provided by verbal instruction, video, and written materials to support subject.  Instructors address signs and symptoms of angina and heart attacks.    Also discuss risk factors for heart disease and how to make changes to improve heart health risk factors.   CARDIAC REHAB PHASE II EXERCISE from 02/14/2017 in [place]  Date  01/29/17  Instruction Review Code  2- meets goals/outcomes      Functional Fitness:  -Group instruction provided by verbal instruction, demonstration, patient participation, and written materials to support subject.  Instructors address safety measures for doing things around the house.  Discuss how to get up and down off the floor, how to pick things up properly, how to safely get out of a chair without assistance, and balance training.   CARDIAC REHAB PHASE II EXERCISE from 02/14/2017 in [place]  Date  12/27/16  Educator  ep  Instruction Review Code  2- meets goals/outcomes      Meditation and Mindfulness:  -Group instruction provided by verbal instruction, patient participation, and written materials to support subject.  Instructor addresses importance of mindfulness and meditation practice to  help reduce stress and improve awareness.  Instructor also leads participants through a meditation exercise.    CARDIAC REHAB PHASE II EXERCISE from 02/14/2017 in [place]  Date  02/12/17  Instruction Review Code  2- meets goals/outcomes      Stretching for Flexibility and Mobility:  -Group instruction provided by verbal instruction, patient participation, and written materials to support subject.  Instructors lead participants through series of stretches that are designed to increase flexibility thus improving mobility.  These stretches are additional exercise for major muscle groups that are  typically performed during regular warm up and cool down.   CARDIAC REHAB PHASE II EXERCISE from 02/14/2017 in [place]  Date  02/14/17  Educator  EP  Instruction Review Code  R- Review/reinforce      Hands Only CPR:  -Group verbal, video, and participation provides a basic overview of AHA guidelines for community CPR. Role-play of emergencies allow participants the opportunity to practice calling for help and chest compression technique with discussion of AED use.   Hypertension: -Group verbal and written instruction that provides a basic overview of hypertension including the most recent diagnostic guidelines, risk factor reduction with self-care instructions and medication management.   CARDIAC REHAB PHASE II EXERCISE from 02/14/2017 in [place]  Date  02/07/17  Instruction Review Code  2- meets goals/outcomes       Nutrition I class: Heart Healthy Eating:  -Group instruction provided by PowerPoint slides, verbal discussion, and written materials to support subject matter. The instructor gives an explanation and review of the Therapeutic Lifestyle Changes diet recommendations, which includes a discussion on lipid goals, dietary fat, sodium, fiber, plant stanol/sterol esters, sugar, and the  components of a well-balanced, healthy diet.   CARDIAC REHAB PHASE II EXERCISE from 02/14/2017 in [place]  Date  11/14/16  Educator  RD  Instruction Review Code  Not applicable [class handouts given]      Nutrition II class: Lifestyle Skills:  -Group instruction provided by PowerPoint slides, verbal discussion, and written materials to support subject matter. The instructor gives an explanation and review of label reading, grocery shopping for heart health, heart healthy recipe modifications, and ways to make healthier choices when eating out.   CARDIAC REHAB PHASE II EXERCISE from 02/14/2017 in [place]  Date  11/14/16  Educator  RD  Instruction Review Code  Not applicable [class handouts given]      Diabetes Question & Answer:  -Group instruction provided by PowerPoint slides, verbal discussion, and written materials to support subject matter. The instructor gives an explanation and review of diabetes co-morbidities, pre- and post-prandial blood glucose goals, pre-exercise blood glucose goals, signs, symptoms, and treatment of hypoglycemia and hyperglycemia, and foot care basics.   CARDIAC REHAB PHASE II EXERCISE from 02/14/2017 in [place]  Date  01/24/17  Educator  RD  Instruction Review Code  2- meets goals/outcomes      Diabetes Blitz:  -Group instruction provided by PowerPoint slides, verbal discussion, and written materials to support subject matter. The instructor gives an explanation and review of the physiology behind type 1 and type 2 diabetes, diabetes medications and rational behind using different medications, pre- and post-prandial blood glucose recommendations and Hemoglobin A1c goals, diabetes diet, and exercise including blood glucose guidelines for exercising safely.    Portion Distortion:  -Group instruction provided by PowerPoint slides, verbal discussion, written  materials, and food models to support subject matter. The instructor gives an explanation of serving size versus portion size, changes in portions sizes over the last 20 years, and what consists of a serving from each food group.   CARDIAC REHAB PHASE II EXERCISE from 02/14/2017 in [place]  Date  01/01/17  Educator  RD  Instruction Review Code  2- meets goals/outcomes      Stress Management:  -Group instruction provided by verbal instruction, video, and written materials to support subject matter.  Instructors review  role of stress in heart disease and how to cope with stress positively.     Exercising on Your Own:  -Group instruction provided by verbal instruction, power point, and written materials to support subject.  Instructors discuss benefits of exercise, components of exercise, frequency and intensity of exercise, and end points for exercise.  Also discuss use of nitroglycerin and activating EMS.  Review options of places to exercise outside of rehab.  Review guidelines for sex with heart disease.   CARDIAC REHAB PHASE II EXERCISE from 02/14/2017 in [place]  Date  01/15/17  Instruction Review Code  2- meets goals/outcomes      Cardiac Drugs I:  -Group instruction provided by verbal instruction and written materials to support subject.  Instructor reviews cardiac drug classes: antiplatelets, anticoagulants, beta blockers, and statins.  Instructor discusses reasons, side effects, and lifestyle considerations for each drug class.   CARDIAC REHAB PHASE II EXERCISE from 02/14/2017 in [facility]  Date  12/18/16  Instruction Review Code  2- meets goals/outcomes      Cardiac Drugs II:  -Group instruction provided by verbal instruction and written materials to support subject.  Instructor reviews cardiac drug classes: angiotensin converting enzyme inhibitors (ACE-I), angiotensin II receptor  blockers (ARBs), nitrates, and calcium channel blockers.  Instructor discusses reasons, side effects, and lifestyle considerations for each drug class.   Anatomy and Physiology of the Circulatory System:  Group verbal and written instruction and models provide basic cardiac anatomy and physiology, with the coronary electrical and arterial systems. Review of: AMI, Angina, Valve disease, Heart Failure, Peripheral Artery Disease, Cardiac Arrhythmia, Pacemakers, and the ICD.   CARDIAC REHAB PHASE II EXERCISE from 02/14/2017 in [place]  Date  12/11/16  Educator  RN  Instruction Review Code  2- meets goals/outcomes      Other Education:  -Group or individual verbal, written, or video instructions that support the educational goals of the cardiac rehab program.   Knowledge Questionnaire Score:     Knowledge Questionnaire Score - 11/14/16 1129      Knowledge Questionnaire Score   Pre Score 13/24      Core Components/Risk Factors/Patient Goals at Admission:     Personal Goals and Risk Factors at Admission - 11/14/16 1141      Core Components/Risk Factors/Patient Goals on Admission   Diabetes Yes   Intervention Provide education about signs/symptoms and action to take for hypo/hyperglycemia.;Provide education about proper nutrition, including hydration, and aerobic/resistive exercise prescription along with prescribed medications to achieve blood glucose in normal ranges: Fasting glucose 65-99 mg/dL   Expected Outcomes Short Term: Participant verbalizes understanding of the signs/symptoms and immediate care of hyper/hypoglycemia, proper foot care and importance of medication, aerobic/resistive exercise and nutrition plan for blood glucose control.;Long Term: Attainment of HbA1C < 7%.   Lipids Yes   Intervention Provide education and support for participant on nutrition & aerobic/resistive exercise along with prescribed medications to achieve LDL 70mg , HDL  >40mg .   Expected Outcomes Short Term: Participant states understanding of desired cholesterol values and is compliant with medications prescribed. Participant is following exercise prescription and nutrition guidelines.;Long Term: Cholesterol controlled with medications as prescribed, with individualized exercise RX and with personalized nutrition plan. Value goals: LDL < 70mg , HDL > 40 mg.      Core Components/Risk Factors/Patient Goals Review:      Goals and Risk Factor Review    Row Name 11/24/16 1623 12/23/16 1532  01/21/17 2226 02/16/17 1131       Core Components/Risk Factors/Patient Goals Review   Personal Goals Review Diabetes;Hypertension Diabetes;Hypertension Diabetes;Hypertension Diabetes;Hypertension    Review Pt is off to a good start toward achieving his desired goals.  Continue to monitor his progress. Pt is off to a good start toward achieving the desired goals of diabetes and bp readings within normal limits. Continue to monitor his progress. Pt  continues to make progress toward achieving the desired goals of diabetes and bp readings within normal limits. Continue to monitor his progress. Pt  continues to make progress toward achieving the desired goals of diabetes and bp readings within normal limits. Continue to monitor his progress.    Expected Outcomes Pt will achieve his desired goals with improved diabetes management and blood pressure readings within normal limits. Pt will achieve his desired goals with improved diabetes management and blood pressure readings within normal limits. Pt will achieve his desired goals with improved diabetes management and blood pressure readings within normal limits. Pt will achieve his desired goals with improved diabetes management and blood pressure readings within normal limits.       Core Components/Risk Factors/Patient Goals at Discharge (Final Review):      Goals and Risk Factor Review - 02/16/17 1131      Core Components/Risk  Factors/Patient Goals Review   Personal Goals Review Diabetes;Hypertension   Review Pt  continues to make progress toward achieving the desired goals of diabetes and bp readings within normal limits. Continue to monitor his progress.   Expected Outcomes Pt will achieve his desired goals with improved diabetes management and blood pressure readings within normal limits.      ITP Comments:     ITP Comments    Row Name 11/14/16 0914 02/18/17 1541         ITP Comments Dr. Fransico Him, Medical Director Dr. Fransico Him, Medical Director         Comments:  R[facility]  is making expected progress toward personal goals after completing 30 sessions.Psychosocial Assessmentreveals no barriers to participating in rehab. Pt is happy go lucky and demonstrates a healthy and positive outlook on life. Pt feels supported in his rehab participation by his family.Pt is nearing graduation soon.Pt interacts with fellow participants and is engaging with rehab staff.  Recommend continued exercise and life style modification education including  stress management and relaxation techniques to decrease cardiac risk profile. Cherre Huger, BSN Cardiac and Training and development officer

## 2017-02-17 ENCOUNTER — Encounter (HOSPITAL_COMMUNITY): Payer: Medicare Other

## 2017-02-19 ENCOUNTER — Encounter (HOSPITAL_COMMUNITY)
Admission: RE | Admit: 2017-02-19 | Discharge: 2017-02-19 | Disposition: A | Discharge status: Home / Self Care | Payer: Medicare Other | Source: Ambulatory Visit | Attending: Cardiology | Admitting: Cardiology

## 2017-02-19 ENCOUNTER — Encounter (HOSPITAL_COMMUNITY): Payer: Medicare Other

## 2017-02-19 DIAGNOSIS — Z955 Presence of coronary angioplasty implant and graft: Secondary | ICD-10-CM

## 2017-02-21 ENCOUNTER — Encounter (HOSPITAL_COMMUNITY): Payer: Medicare Other

## 2017-02-21 ENCOUNTER — Encounter (HOSPITAL_COMMUNITY)
Admission: RE | Admit: 2017-02-21 | Discharge: 2017-02-21 | Disposition: A | Discharge status: Home / Self Care | Payer: Medicare Other | Source: Ambulatory Visit | Attending: Cardiology | Admitting: Cardiology

## 2017-02-21 DIAGNOSIS — Z955 Presence of coronary angioplasty implant and graft: Secondary | ICD-10-CM

## 2017-02-24 ENCOUNTER — Encounter (HOSPITAL_COMMUNITY): Payer: Medicare Other

## 2017-02-24 ENCOUNTER — Encounter (HOSPITAL_COMMUNITY)
Admission: RE | Admit: 2017-02-24 | Discharge: 2017-02-24 | Disposition: A | Discharge status: Home / Self Care | Payer: Medicare Other | Source: Ambulatory Visit | Attending: Cardiology | Admitting: Cardiology

## 2017-02-24 DIAGNOSIS — Z955 Presence of coronary angioplasty implant and graft: Secondary | ICD-10-CM | POA: Diagnosis not present

## 2017-02-26 ENCOUNTER — Encounter (HOSPITAL_COMMUNITY)
Admission: RE | Admit: 2017-02-26 | Discharge: 2017-02-26 | Disposition: A | Discharge status: Home / Self Care | Payer: Medicare Other | Source: Ambulatory Visit | Attending: Cardiology | Admitting: Cardiology

## 2017-02-26 DIAGNOSIS — Z955 Presence of coronary angioplasty implant and graft: Secondary | ICD-10-CM | POA: Diagnosis not present

## 2017-02-26 NOTE — Progress Notes (Signed)
Discharge Progress Report  Patient Details  Name: [name] MRN: [id number] Date of Birth: 1940/06/03 Referring Provider:     [place] from 11/14/2016 in [facility]  Referring Provider  Kela Millin MD       Number of Visits: 34  Reason for Discharge:  Patient reached a stable level of exercise. Patient independent in their exercise. Patient has met program and personal goals.  Smoking History:  History  Smoking Status  . Never Smoker  Smokeless Tobacco  . Never Used    Diagnosis:  Status post coronary artery stent placement  ADL UCSD:   Initial Exercise Prescription:     Initial Exercise Prescription - 11/14/16 1100      Date of Initial Exercise RX and Referring Provider   Date 11/14/16   Referring Provider Kela Millin MD     Recumbant Bike   Level 1.5   Minutes 10   METs 2     NuStep   Level 2   SPM 80   Minutes 10   METs 2     Arm Ergometer   Level 25   Minutes 10   METs 2.2     Prescription Details   Frequency (times per week) 3   Duration Progress to 45 minutes of aerobic exercise without signs/symptoms of physical distress     Intensity   THRR 40-80% of Max Heartrate 57-114   Ratings of Perceived Exertion 11-13   Perceived Dyspnea 0-4     Progression   Progression Continue to progress workloads to maintain intensity without signs/symptoms of physical distress.     Resistance Training   Training Prescription Yes   Weight 2   Reps 10-15      Discharge Exercise Prescription (Final Exercise Prescription Changes):     Exercise Prescription Changes - 02/26/17 1542      Response to Exercise   Blood Pressure (Admit) 130/70   Blood Pressure (Exercise) 148/82   Blood Pressure (Exit) 118/62   Heart Rate (Admit) 47 bpm   Heart Rate (Exercise) 80 bpm   Heart Rate (Exit) 53 bpm   Rating of Perceived Exertion (Exercise) 11   Symptoms none   Duration Continue with  30 min of aerobic exercise without signs/symptoms of physical distress.   Intensity THRR unchanged     Progression   Progression Continue to progress workloads to maintain intensity without signs/symptoms of physical distress.   Average METs 4.3     Resistance Training   Training Prescription No     NuStep   Level 5   SPM 100   Minutes 15   METs 5.1     Track   Laps 21   Minutes 15   METs 3.43     Home Exercise Plan   Plans to continue exercise at Home (comment)  walking   Frequency Add 3 additional days to program exercise sessions.   Initial Home Exercises Provided 12/02/16      Functional Capacity:     6 Minute Walk    Row Name 11/14/16 1131 02/14/17 1414       6 Minute Walk   Phase Initial Discharge    Distance 1300 feet 1566 feet    Distance % Change  - 20.46 %    Distance Feet Change  - 266 ft    Walk Time 6 minutes 6 minutes    # of Rest Breaks 0 0    MPH 2.5 2.96  METS 2.4 2.94    RPE 11 11    VO2 Peak 8.6 10.31    Symptoms No No    Resting HR 69 bpm 55 bpm    Resting BP 122/78 114/70    Max Ex. HR 75 bpm 79 bpm    Max Ex. BP 138/50 134/70    2 Minute Post BP 134/64 110/60       Psychological, QOL, Others - Outcomes: PHQ 2/9: Depression screen [place] 2/9 02/26/2017 11/22/2016  Decreased Interest 0 0  Down, Depressed, Hopeless 0 0  PHQ - 2 Score 0 0    Quality of Life:     Quality of Life - 02/28/17 1549      Quality of Life Scores   Health/Function Pre 22.6 %   Health/Function Post 23 %   Health/Function % Change 1.77 %   Socioeconomic Pre 24.38 %   Socioeconomic Post 24.42 %   Socioeconomic % Change  0.16 %   Psych/Spiritual Pre 24.86 %   Psych/Spiritual Post 24.64 %   Psych/Spiritual % Change -0.88 %   Family Pre 26.25 %   Family Post 27.6 %   Family % Change 5.14 %   GLOBAL Pre 23.85 %   GLOBAL Post 24.3 %   GLOBAL % Change 1.89 %      Personal Goals: Goals established at orientation with interventions provided to work  toward goal.     Personal Goals and Risk Factors at Admission - 11/14/16 1141      Core Components/Risk Factors/Patient Goals on Admission   Diabetes Yes   Intervention Provide education about signs/symptoms and action to take for hypo/hyperglycemia.;Provide education about proper nutrition, including hydration, and aerobic/resistive exercise prescription along with prescribed medications to achieve blood glucose in normal ranges: Fasting glucose 65-99 mg/dL   Expected Outcomes Short Term: Participant verbalizes understanding of the signs/symptoms and immediate care of hyper/hypoglycemia, proper foot care and importance of medication, aerobic/resistive exercise and nutrition plan for blood glucose control.;Long Term: Attainment of HbA1C < 7%.   Lipids Yes   Intervention Provide education and support for participant on nutrition & aerobic/resistive exercise along with prescribed medications to achieve LDL <49m, HDL >412m   Expected Outcomes Short Term: Participant states understanding of desired cholesterol values and is compliant with medications prescribed. Participant is following exercise prescription and nutrition guidelines.;Long Term: Cholesterol controlled with medications as prescribed, with individualized exercise RX and with personalized nutrition plan. Value goals: LDL < 7017mHDL > 40 mg.       Personal Goals Discharge:     Goals and Risk Factor Review    Row Name 11/24/16 1623 12/23/16 1532 01/21/17 2226 02/16/17 1131       Core Components/Risk Factors/Patient Goals Review   Personal Goals Review Diabetes;Hypertension Diabetes;Hypertension Diabetes;Hypertension Diabetes;Hypertension    Review Pt is off to a good start toward achieving his desired goals.  Continue to monitor his progress. Pt is off to a good start toward achieving the desired goals of diabetes and bp readings within normal limits. Continue to monitor his progress. Pt  continues to make progress toward achieving  the desired goals of diabetes and bp readings within normal limits. Continue to monitor his progress. Pt  continues to make progress toward achieving the desired goals of diabetes and bp readings within normal limits. Continue to monitor his progress.    Expected Outcomes Pt will achieve his desired goals with improved diabetes management and blood pressure readings within normal limits. Pt will achieve  his desired goals with improved diabetes management and blood pressure readings within normal limits. Pt will achieve his desired goals with improved diabetes management and blood pressure readings within normal limits. Pt will achieve his desired goals with improved diabetes management and blood pressure readings within normal limits.       Exercise Goals and Review:     Exercise Goals    Row Name 11/14/16 1139             Exercise Goals   Increase Physical Activity Yes       Intervention Provide advice, education, support and counseling about physical activity/exercise needs.;Develop an individualized exercise prescription for aerobic and resistive training based on initial evaluation findings, risk stratification, comorbidities and participant's personal goals.       Expected Outcomes Achievement of increased cardiorespiratory fitness and enhanced flexibility, muscular endurance and strength shown through measurements of functional capacity and personal statement of participant.       Increase Strength and Stamina Yes       Intervention Provide advice, education, support and counseling about physical activity/exercise needs.;Develop an individualized exercise prescription for aerobic and resistive training based on initial evaluation findings, risk stratification, comorbidities and participant's personal goals.       Expected Outcomes Achievement of increased cardiorespiratory fitness and enhanced flexibility, muscular endurance and strength shown through measurements of functional capacity  and personal statement of participant.          Nutrition & Weight - Outcomes:     Pre Biometrics - 11/14/16 1140      Pre Biometrics   Waist Circumference 40.5 inches   Hip Circumference 39.5 inches   Waist to Hip Ratio 1.03 %   Triceps Skinfold 12 mm   % Body Fat 26.5 %   Grip Strength 36 kg   Flexibility 11 in   Single Leg Stand 9.3 seconds         Post Biometrics - 02/14/17 1415       Post  Biometrics   Height 5' 10.25" (1.784 m)   Weight 182 lb 5.1 oz (82.7 kg)   Waist Circumference 38.5 inches   Hip Circumference 38 inches   Waist to Hip Ratio 1.01 %   BMI (Calculated) 25.98   Triceps Skinfold 12 mm   % Body Fat 25.4 %   Grip Strength 33 kg   Flexibility 9.5 in   Single Leg Stand 12.75 seconds      Nutrition:     Nutrition Therapy & Goals - 11/15/16 0830      Nutrition Therapy   Diet Therapeutic Lifestyle Changes     Intervention Plan   Intervention Prescribe, educate and counsel regarding individualized specific dietary modifications aiming towards targeted core components such as weight, hypertension, lipid management, diabetes, heart failure and other comorbidities.   Expected Outcomes Short Term Goal: Understand basic principles of dietary content, such as calories, fat, sodium, cholesterol and nutrients.;Long Term Goal: Adherence to prescribed nutrition plan.      Nutrition Discharge:     Nutrition Assessments - 03/07/17 1122      MEDFICTS Scores   Pre Score 12   Post Score 0   Score Difference -12      Education Questionnaire Score:     Knowledge Questionnaire Score - 02/24/17 1458      Knowledge Questionnaire Score   Post Score 23/24      Goals reviewed with patient. Pt graduated from cardiac rehab program today with completion of 34 exercise sessions  in Phase II. Pt maintained good attendance and progressed nicely during his participation in rehab as evidenced by increased MET level.   Medication list reconciled. Repeat  PHQ  score-0. Pt completed post quality of life survey. Pt scored the following:     Quality of Life - 02/28/17 1549      Quality of Life Scores   Health/Function Pre 22.6 %   Health/Function Post 23 %   Health/Function % Change 1.77 %   Socioeconomic Pre 24.38 %   Socioeconomic Post 24.42 %   Socioeconomic % Change  0.16 %   Psych/Spiritual Pre 24.86 %   Psych/Spiritual Post 24.64 %   Psych/Spiritual % Change -0.88 %   Family Pre 26.25 %   Family Post 27.6 %   Family % Change 5.14 %   GLOBAL Pre 23.85 %   GLOBAL Post 24.3 %   GLOBAL % Change 1.89 %     Pt has made significant lifestyle changes and should be commended for his success. Pt feels he has achieved his goals during cardiac rehab. Pt has gotten back to playing golf and being active.  Pt plans to continue exercise in cardiac maintenance program when he returns from vaction. Cherre Huger, BSN Cardiac and Training and development officer

## 2017-03-19 ENCOUNTER — Encounter (HOSPITAL_COMMUNITY)
Admission: RE | Admit: 2017-03-19 | Discharge: 2017-03-19 | Disposition: A | Discharge status: Home / Self Care | Payer: Self-pay | Source: Ambulatory Visit | Attending: Cardiology | Admitting: Cardiology

## 2017-03-19 ENCOUNTER — Encounter (HOSPITAL_COMMUNITY): Admission: RE | Admit: 2017-03-19 | Payer: Self-pay | Source: Ambulatory Visit

## 2017-03-19 ENCOUNTER — Encounter (HOSPITAL_COMMUNITY): Payer: Self-pay

## 2017-03-19 DIAGNOSIS — Z955 Presence of coronary angioplasty implant and graft: Secondary | ICD-10-CM | POA: Insufficient documentation

## 2017-03-21 ENCOUNTER — Encounter (HOSPITAL_COMMUNITY)
Admission: RE | Admit: 2017-03-21 | Discharge: 2017-03-21 | Disposition: A | Discharge status: Home / Self Care | Payer: Self-pay | Source: Ambulatory Visit | Attending: Cardiology | Admitting: Cardiology

## 2017-03-24 ENCOUNTER — Encounter (HOSPITAL_COMMUNITY)
Admission: RE | Admit: 2017-03-24 | Discharge: 2017-03-24 | Disposition: A | Discharge status: Home / Self Care | Payer: Self-pay | Source: Ambulatory Visit | Attending: Cardiology | Admitting: Cardiology

## 2017-03-26 ENCOUNTER — Telehealth (HOSPITAL_COMMUNITY): Payer: Self-pay | Admitting: *Deleted

## 2017-03-26 ENCOUNTER — Encounter (HOSPITAL_COMMUNITY): Payer: Self-pay

## 2017-03-28 ENCOUNTER — Encounter (HOSPITAL_COMMUNITY)
Admission: RE | Admit: 2017-03-28 | Discharge: 2017-03-28 | Disposition: A | Discharge status: Home / Self Care | Payer: Self-pay | Source: Ambulatory Visit | Attending: Cardiology | Admitting: Cardiology

## 2017-03-31 ENCOUNTER — Encounter (HOSPITAL_COMMUNITY): Payer: Self-pay

## 2017-04-02 ENCOUNTER — Encounter (HOSPITAL_COMMUNITY): Payer: Self-pay

## 2017-04-07 ENCOUNTER — Encounter (HOSPITAL_COMMUNITY): Payer: Self-pay

## 2017-04-09 ENCOUNTER — Encounter (HOSPITAL_COMMUNITY)
Admission: RE | Admit: 2017-04-09 | Discharge: 2017-04-09 | Disposition: A | Discharge status: Home / Self Care | Payer: Self-pay | Source: Ambulatory Visit | Attending: Cardiology | Admitting: Cardiology

## 2017-04-11 ENCOUNTER — Encounter (HOSPITAL_COMMUNITY): Payer: Self-pay

## 2017-04-14 ENCOUNTER — Encounter (HOSPITAL_COMMUNITY)
Admission: RE | Admit: 2017-04-14 | Discharge: 2017-04-14 | Disposition: A | Discharge status: Home / Self Care | Payer: Self-pay | Source: Ambulatory Visit | Attending: Cardiology | Admitting: Cardiology

## 2017-04-14 DIAGNOSIS — Z955 Presence of coronary angioplasty implant and graft: Secondary | ICD-10-CM | POA: Insufficient documentation

## 2017-04-16 ENCOUNTER — Encounter (HOSPITAL_COMMUNITY)
Admission: RE | Admit: 2017-04-16 | Discharge: 2017-04-16 | Disposition: A | Discharge status: Home / Self Care | Payer: Self-pay | Source: Ambulatory Visit | Attending: Cardiology | Admitting: Cardiology

## 2017-04-18 ENCOUNTER — Encounter (HOSPITAL_COMMUNITY)
Admission: RE | Admit: 2017-04-18 | Discharge: 2017-04-18 | Disposition: A | Discharge status: Home / Self Care | Payer: Medicare Other | Source: Ambulatory Visit | Attending: Cardiology | Admitting: Cardiology

## 2017-04-21 ENCOUNTER — Encounter (HOSPITAL_COMMUNITY): Payer: Self-pay

## 2017-04-23 ENCOUNTER — Encounter (HOSPITAL_COMMUNITY): Payer: Self-pay

## 2017-04-25 ENCOUNTER — Encounter (HOSPITAL_COMMUNITY)
Admission: RE | Admit: 2017-04-25 | Discharge: 2017-04-25 | Disposition: A | Discharge status: Home / Self Care | Payer: Medicare Other | Source: Ambulatory Visit | Attending: Cardiology | Admitting: Cardiology

## 2017-04-28 ENCOUNTER — Encounter (HOSPITAL_COMMUNITY)
Admission: RE | Admit: 2017-04-28 | Discharge: 2017-04-28 | Disposition: A | Discharge status: Home / Self Care | Payer: Self-pay | Source: Ambulatory Visit | Attending: Cardiology | Admitting: Cardiology

## 2017-04-30 ENCOUNTER — Encounter (HOSPITAL_COMMUNITY): Payer: Self-pay

## 2017-05-02 ENCOUNTER — Encounter (HOSPITAL_COMMUNITY)
Admission: RE | Admit: 2017-05-02 | Discharge: 2017-05-02 | Disposition: A | Discharge status: Home / Self Care | Payer: Self-pay | Source: Ambulatory Visit | Attending: Cardiology | Admitting: Cardiology

## 2017-05-07 ENCOUNTER — Encounter (HOSPITAL_COMMUNITY)
Admission: RE | Admit: 2017-05-07 | Discharge: 2017-05-07 | Disposition: A | Discharge status: Home / Self Care | Payer: Medicare Other | Source: Ambulatory Visit | Attending: Cardiology | Admitting: Cardiology

## 2017-05-09 ENCOUNTER — Encounter (HOSPITAL_COMMUNITY)
Admission: RE | Admit: 2017-05-09 | Discharge: 2017-05-09 | Disposition: A | Discharge status: Home / Self Care | Payer: Self-pay | Source: Ambulatory Visit | Attending: Cardiology | Admitting: Cardiology

## 2017-05-12 ENCOUNTER — Encounter (HOSPITAL_COMMUNITY)
Admission: RE | Admit: 2017-05-12 | Discharge: 2017-05-12 | Disposition: A | Discharge status: Home / Self Care | Payer: Self-pay | Source: Ambulatory Visit | Attending: Cardiology | Admitting: Cardiology

## 2017-05-14 ENCOUNTER — Encounter (HOSPITAL_COMMUNITY)
Admission: RE | Admit: 2017-05-14 | Discharge: 2017-05-14 | Disposition: A | Discharge status: Home / Self Care | Payer: Self-pay | Source: Ambulatory Visit | Attending: Cardiology | Admitting: Cardiology

## 2017-05-14 DIAGNOSIS — Z955 Presence of coronary angioplasty implant and graft: Secondary | ICD-10-CM | POA: Insufficient documentation

## 2017-05-16 ENCOUNTER — Encounter (HOSPITAL_COMMUNITY): Payer: Self-pay

## 2017-05-19 ENCOUNTER — Encounter (HOSPITAL_COMMUNITY)
Admission: RE | Admit: 2017-05-19 | Discharge: 2017-05-19 | Disposition: A | Discharge status: Home / Self Care | Payer: Self-pay | Source: Ambulatory Visit | Attending: Cardiology | Admitting: Cardiology

## 2017-05-21 ENCOUNTER — Encounter (HOSPITAL_COMMUNITY): Payer: Self-pay

## 2017-05-23 ENCOUNTER — Encounter (HOSPITAL_COMMUNITY)
Admission: RE | Admit: 2017-05-23 | Discharge: 2017-05-23 | Disposition: A | Discharge status: Home / Self Care | Payer: Medicare Other | Source: Ambulatory Visit | Attending: Cardiology | Admitting: Cardiology

## 2017-05-26 ENCOUNTER — Encounter (HOSPITAL_COMMUNITY)
Admission: RE | Admit: 2017-05-26 | Discharge: 2017-05-26 | Disposition: A | Discharge status: Home / Self Care | Payer: Medicare Other | Source: Ambulatory Visit | Attending: Cardiology | Admitting: Cardiology

## 2017-05-28 ENCOUNTER — Encounter (HOSPITAL_COMMUNITY): Payer: Self-pay

## 2017-05-30 ENCOUNTER — Encounter (HOSPITAL_COMMUNITY)
Admission: RE | Admit: 2017-05-30 | Discharge: 2017-05-30 | Disposition: A | Discharge status: Home / Self Care | Payer: Self-pay | Source: Ambulatory Visit | Attending: Cardiology | Admitting: Cardiology

## 2017-06-02 ENCOUNTER — Encounter (HOSPITAL_COMMUNITY): Payer: Self-pay

## 2017-06-04 ENCOUNTER — Encounter (HOSPITAL_COMMUNITY)
Admission: RE | Admit: 2017-06-04 | Discharge: 2017-06-04 | Disposition: A | Discharge status: Home / Self Care | Payer: Self-pay | Source: Ambulatory Visit | Attending: Cardiology | Admitting: Cardiology

## 2017-06-06 ENCOUNTER — Encounter (HOSPITAL_COMMUNITY)
Admission: RE | Admit: 2017-06-06 | Discharge: 2017-06-06 | Disposition: A | Discharge status: Home / Self Care | Payer: Self-pay | Source: Ambulatory Visit | Attending: Cardiology | Admitting: Cardiology

## 2017-06-09 ENCOUNTER — Encounter (HOSPITAL_COMMUNITY)
Admission: RE | Admit: 2017-06-09 | Discharge: 2017-06-09 | Disposition: A | Discharge status: Home / Self Care | Payer: Self-pay | Source: Ambulatory Visit | Attending: Cardiology | Admitting: Cardiology

## 2017-06-11 ENCOUNTER — Encounter (HOSPITAL_COMMUNITY)
Admission: RE | Admit: 2017-06-11 | Discharge: 2017-06-11 | Disposition: A | Discharge status: Home / Self Care | Payer: Self-pay | Source: Ambulatory Visit | Attending: Cardiology | Admitting: Cardiology

## 2017-06-25 ENCOUNTER — Encounter (HOSPITAL_COMMUNITY): Admission: RE | Admit: 2017-06-25 | Payer: Self-pay | Source: Ambulatory Visit

## 2017-07-16 ENCOUNTER — Encounter (HOSPITAL_COMMUNITY): Payer: Self-pay

## 2017-07-16 DIAGNOSIS — Z48812 Encounter for surgical aftercare following surgery on the circulatory system: Secondary | ICD-10-CM | POA: Insufficient documentation

## 2017-07-16 DIAGNOSIS — Z955 Presence of coronary angioplasty implant and graft: Secondary | ICD-10-CM | POA: Insufficient documentation

## 2017-07-18 ENCOUNTER — Encounter (HOSPITAL_COMMUNITY): Payer: Self-pay

## 2017-07-21 ENCOUNTER — Encounter (HOSPITAL_COMMUNITY): Admission: RE | Admit: 2017-07-21 | Payer: Self-pay | Source: Ambulatory Visit

## 2017-07-23 ENCOUNTER — Encounter (HOSPITAL_COMMUNITY): Payer: Self-pay

## 2017-07-25 ENCOUNTER — Encounter (HOSPITAL_COMMUNITY): Payer: Self-pay

## 2017-07-28 ENCOUNTER — Encounter (HOSPITAL_COMMUNITY)
Admission: RE | Admit: 2017-07-28 | Discharge: 2017-07-28 | Disposition: A | Discharge status: Home / Self Care | Payer: Self-pay | Source: Ambulatory Visit | Attending: Cardiology | Admitting: Cardiology

## 2017-07-30 ENCOUNTER — Encounter (HOSPITAL_COMMUNITY): Payer: Self-pay

## 2017-08-01 ENCOUNTER — Encounter (HOSPITAL_COMMUNITY): Payer: Self-pay

## 2017-08-04 ENCOUNTER — Encounter (HOSPITAL_COMMUNITY): Payer: Self-pay

## 2017-08-06 ENCOUNTER — Encounter (HOSPITAL_COMMUNITY)
Admission: RE | Admit: 2017-08-06 | Discharge: 2017-08-06 | Disposition: A | Discharge status: Home / Self Care | Payer: Self-pay | Source: Ambulatory Visit | Attending: Cardiology | Admitting: Cardiology

## 2017-08-08 ENCOUNTER — Encounter (HOSPITAL_COMMUNITY): Payer: Self-pay

## 2017-08-11 ENCOUNTER — Encounter (HOSPITAL_COMMUNITY)
Admission: RE | Admit: 2017-08-11 | Discharge: 2017-08-11 | Disposition: A | Discharge status: Home / Self Care | Payer: Self-pay | Source: Ambulatory Visit | Attending: Cardiology | Admitting: Cardiology

## 2017-08-11 DIAGNOSIS — Z955 Presence of coronary angioplasty implant and graft: Secondary | ICD-10-CM | POA: Insufficient documentation

## 2017-08-11 DIAGNOSIS — Z48812 Encounter for surgical aftercare following surgery on the circulatory system: Secondary | ICD-10-CM | POA: Insufficient documentation

## 2017-08-13 ENCOUNTER — Encounter (HOSPITAL_COMMUNITY): Payer: Self-pay

## 2017-08-15 ENCOUNTER — Encounter (HOSPITAL_COMMUNITY): Payer: Self-pay

## 2017-08-18 ENCOUNTER — Encounter (HOSPITAL_COMMUNITY)
Admission: RE | Admit: 2017-08-18 | Discharge: 2017-08-18 | Disposition: A | Discharge status: Home / Self Care | Payer: Self-pay | Source: Ambulatory Visit | Attending: Cardiology | Admitting: Cardiology

## 2017-08-20 ENCOUNTER — Encounter (HOSPITAL_COMMUNITY)
Admission: RE | Admit: 2017-08-20 | Discharge: 2017-08-20 | Disposition: A | Discharge status: Home / Self Care | Payer: Self-pay | Source: Ambulatory Visit | Attending: Cardiology | Admitting: Cardiology

## 2017-08-22 ENCOUNTER — Encounter (HOSPITAL_COMMUNITY): Payer: Self-pay

## 2017-08-25 ENCOUNTER — Encounter (HOSPITAL_COMMUNITY): Payer: Self-pay

## 2017-08-27 ENCOUNTER — Encounter (HOSPITAL_COMMUNITY)
Admission: RE | Admit: 2017-08-27 | Discharge: 2017-08-27 | Disposition: A | Discharge status: Home / Self Care | Payer: Self-pay | Source: Ambulatory Visit | Attending: Cardiology | Admitting: Cardiology

## 2017-08-29 ENCOUNTER — Encounter (HOSPITAL_COMMUNITY): Payer: Self-pay

## 2017-09-01 ENCOUNTER — Encounter (HOSPITAL_COMMUNITY)
Admission: RE | Admit: 2017-09-01 | Discharge: 2017-09-01 | Disposition: A | Discharge status: Home / Self Care | Payer: Medicare Other | Source: Ambulatory Visit | Attending: Cardiology | Admitting: Cardiology

## 2017-09-03 ENCOUNTER — Encounter (HOSPITAL_COMMUNITY)
Admission: RE | Admit: 2017-09-03 | Discharge: 2017-09-03 | Disposition: A | Discharge status: Home / Self Care | Payer: Medicare Other | Source: Ambulatory Visit | Attending: Cardiology | Admitting: Cardiology

## 2017-09-05 ENCOUNTER — Encounter (HOSPITAL_COMMUNITY): Payer: Self-pay

## 2017-09-08 ENCOUNTER — Encounter (HOSPITAL_COMMUNITY): Payer: Self-pay

## 2017-09-10 ENCOUNTER — Encounter (HOSPITAL_COMMUNITY)
Admission: RE | Admit: 2017-09-10 | Discharge: 2017-09-10 | Disposition: A | Discharge status: Home / Self Care | Payer: Self-pay | Source: Ambulatory Visit | Attending: Cardiology | Admitting: Cardiology

## 2017-09-10 DIAGNOSIS — Z48812 Encounter for surgical aftercare following surgery on the circulatory system: Secondary | ICD-10-CM | POA: Insufficient documentation

## 2017-09-10 DIAGNOSIS — Z955 Presence of coronary angioplasty implant and graft: Secondary | ICD-10-CM | POA: Insufficient documentation

## 2017-09-12 ENCOUNTER — Encounter (HOSPITAL_COMMUNITY)
Admission: RE | Admit: 2017-09-12 | Discharge: 2017-09-12 | Disposition: A | Discharge status: Home / Self Care | Payer: Self-pay | Source: Ambulatory Visit | Attending: Cardiology | Admitting: Cardiology

## 2017-09-15 ENCOUNTER — Encounter (HOSPITAL_COMMUNITY)
Admission: RE | Admit: 2017-09-15 | Discharge: 2017-09-15 | Disposition: A | Discharge status: Home / Self Care | Payer: Self-pay | Source: Ambulatory Visit | Attending: Cardiology | Admitting: Cardiology

## 2017-09-17 ENCOUNTER — Encounter (HOSPITAL_COMMUNITY): Payer: Self-pay

## 2017-09-19 ENCOUNTER — Encounter (HOSPITAL_COMMUNITY)
Admission: RE | Admit: 2017-09-19 | Discharge: 2017-09-19 | Disposition: A | Discharge status: Home / Self Care | Payer: Self-pay | Source: Ambulatory Visit | Attending: Cardiology | Admitting: Cardiology

## 2017-09-22 ENCOUNTER — Encounter (HOSPITAL_COMMUNITY): Payer: Self-pay

## 2017-09-24 ENCOUNTER — Encounter (HOSPITAL_COMMUNITY)
Admission: RE | Admit: 2017-09-24 | Discharge: 2017-09-24 | Disposition: A | Discharge status: Home / Self Care | Payer: Self-pay | Source: Ambulatory Visit | Attending: Cardiology | Admitting: Cardiology

## 2017-09-26 ENCOUNTER — Encounter (HOSPITAL_COMMUNITY): Payer: Self-pay

## 2017-09-29 ENCOUNTER — Encounter (HOSPITAL_COMMUNITY)
Admission: RE | Admit: 2017-09-29 | Discharge: 2017-09-29 | Disposition: A | Discharge status: Home / Self Care | Payer: Self-pay | Source: Ambulatory Visit | Attending: Cardiology | Admitting: Cardiology

## 2017-10-01 ENCOUNTER — Encounter (HOSPITAL_COMMUNITY): Payer: Self-pay

## 2017-10-03 ENCOUNTER — Encounter (HOSPITAL_COMMUNITY)
Admission: RE | Admit: 2017-10-03 | Discharge: 2017-10-03 | Disposition: A | Discharge status: Home / Self Care | Payer: Self-pay | Source: Ambulatory Visit | Attending: Cardiology | Admitting: Cardiology

## 2017-10-08 ENCOUNTER — Encounter (HOSPITAL_COMMUNITY)
Admission: RE | Admit: 2017-10-08 | Discharge: 2017-10-08 | Disposition: A | Discharge status: Home / Self Care | Payer: Self-pay | Source: Ambulatory Visit | Attending: Cardiology | Admitting: Cardiology

## 2017-10-10 ENCOUNTER — Encounter (HOSPITAL_COMMUNITY): Payer: Self-pay

## 2017-10-13 ENCOUNTER — Encounter (HOSPITAL_COMMUNITY)
Admission: RE | Admit: 2017-10-13 | Discharge: 2017-10-13 | Disposition: A | Discharge status: Home / Self Care | Payer: Self-pay | Source: Ambulatory Visit | Attending: Cardiology | Admitting: Cardiology

## 2017-10-13 DIAGNOSIS — Z48812 Encounter for surgical aftercare following surgery on the circulatory system: Secondary | ICD-10-CM | POA: Insufficient documentation

## 2017-10-13 DIAGNOSIS — Z955 Presence of coronary angioplasty implant and graft: Secondary | ICD-10-CM | POA: Insufficient documentation

## 2017-10-15 ENCOUNTER — Encounter (HOSPITAL_COMMUNITY): Payer: Self-pay

## 2017-10-17 ENCOUNTER — Encounter (HOSPITAL_COMMUNITY): Payer: Self-pay

## 2017-10-20 ENCOUNTER — Encounter (HOSPITAL_COMMUNITY): Payer: Self-pay

## 2017-10-22 ENCOUNTER — Encounter (HOSPITAL_COMMUNITY): Payer: Self-pay

## 2017-10-24 ENCOUNTER — Encounter (HOSPITAL_COMMUNITY): Payer: Self-pay

## 2017-10-27 ENCOUNTER — Encounter (HOSPITAL_COMMUNITY): Payer: Self-pay

## 2017-10-29 ENCOUNTER — Encounter (HOSPITAL_COMMUNITY): Payer: Self-pay

## 2017-10-31 ENCOUNTER — Encounter (HOSPITAL_COMMUNITY): Payer: Self-pay

## 2017-11-03 ENCOUNTER — Encounter (HOSPITAL_COMMUNITY): Payer: Self-pay

## 2017-11-05 ENCOUNTER — Encounter (HOSPITAL_COMMUNITY): Payer: Self-pay

## 2017-11-07 ENCOUNTER — Encounter (HOSPITAL_COMMUNITY): Payer: Self-pay

## 2017-11-10 ENCOUNTER — Encounter (HOSPITAL_COMMUNITY): Payer: Self-pay

## 2017-11-10 DIAGNOSIS — Z955 Presence of coronary angioplasty implant and graft: Secondary | ICD-10-CM | POA: Insufficient documentation

## 2017-11-10 DIAGNOSIS — Z48812 Encounter for surgical aftercare following surgery on the circulatory system: Secondary | ICD-10-CM | POA: Insufficient documentation

## 2017-11-12 ENCOUNTER — Encounter (HOSPITAL_COMMUNITY): Payer: Self-pay

## 2017-11-14 ENCOUNTER — Encounter (HOSPITAL_COMMUNITY): Payer: Self-pay

## 2017-11-17 ENCOUNTER — Encounter (HOSPITAL_COMMUNITY): Payer: Self-pay

## 2017-11-19 ENCOUNTER — Encounter (HOSPITAL_COMMUNITY): Payer: Self-pay

## 2017-11-21 ENCOUNTER — Encounter (HOSPITAL_COMMUNITY)
Admission: RE | Admit: 2017-11-21 | Discharge: 2017-11-21 | Disposition: A | Discharge status: Home / Self Care | Payer: Self-pay | Source: Ambulatory Visit | Attending: Cardiology | Admitting: Cardiology

## 2017-11-24 ENCOUNTER — Encounter (HOSPITAL_COMMUNITY)
Admission: RE | Admit: 2017-11-24 | Discharge: 2017-11-24 | Disposition: A | Discharge status: Home / Self Care | Payer: Medicare Other | Source: Ambulatory Visit | Attending: Cardiology | Admitting: Cardiology

## 2017-11-26 ENCOUNTER — Encounter (HOSPITAL_COMMUNITY): Payer: Self-pay

## 2017-11-28 ENCOUNTER — Encounter (HOSPITAL_COMMUNITY): Payer: Self-pay

## 2017-12-01 ENCOUNTER — Encounter (HOSPITAL_COMMUNITY): Payer: Self-pay

## 2017-12-03 ENCOUNTER — Encounter (HOSPITAL_COMMUNITY): Payer: Self-pay

## 2017-12-05 ENCOUNTER — Encounter (HOSPITAL_COMMUNITY)
Admission: RE | Admit: 2017-12-05 | Discharge: 2017-12-05 | Disposition: A | Discharge status: Home / Self Care | Payer: Self-pay | Source: Ambulatory Visit | Attending: Cardiology | Admitting: Cardiology

## 2017-12-08 ENCOUNTER — Encounter (HOSPITAL_COMMUNITY)
Admission: RE | Admit: 2017-12-08 | Discharge: 2017-12-08 | Disposition: A | Discharge status: Home / Self Care | Payer: Self-pay | Source: Ambulatory Visit | Attending: Cardiology | Admitting: Cardiology

## 2017-12-10 ENCOUNTER — Encounter (HOSPITAL_COMMUNITY): Payer: Self-pay

## 2017-12-12 ENCOUNTER — Encounter (HOSPITAL_COMMUNITY): Payer: Self-pay

## 2017-12-12 DIAGNOSIS — Z48812 Encounter for surgical aftercare following surgery on the circulatory system: Secondary | ICD-10-CM | POA: Insufficient documentation

## 2017-12-12 DIAGNOSIS — Z955 Presence of coronary angioplasty implant and graft: Secondary | ICD-10-CM | POA: Insufficient documentation

## 2017-12-15 ENCOUNTER — Encounter (HOSPITAL_COMMUNITY): Payer: Self-pay

## 2017-12-17 ENCOUNTER — Encounter (HOSPITAL_COMMUNITY)
Admission: RE | Admit: 2017-12-17 | Discharge: 2017-12-17 | Disposition: A | Discharge status: Home / Self Care | Payer: Self-pay | Source: Ambulatory Visit | Attending: Cardiology | Admitting: Cardiology

## 2017-12-19 ENCOUNTER — Encounter (HOSPITAL_COMMUNITY): Payer: Self-pay

## 2017-12-22 ENCOUNTER — Encounter (HOSPITAL_COMMUNITY)
Admission: RE | Admit: 2017-12-22 | Discharge: 2017-12-22 | Disposition: A | Discharge status: Home / Self Care | Payer: Self-pay | Source: Ambulatory Visit | Attending: Cardiology | Admitting: Cardiology

## 2017-12-24 ENCOUNTER — Encounter (HOSPITAL_COMMUNITY): Payer: Self-pay

## 2017-12-26 ENCOUNTER — Encounter (HOSPITAL_COMMUNITY): Payer: Self-pay

## 2017-12-29 ENCOUNTER — Encounter (HOSPITAL_COMMUNITY): Payer: Self-pay

## 2017-12-31 ENCOUNTER — Encounter (HOSPITAL_COMMUNITY): Payer: Self-pay

## 2018-01-02 ENCOUNTER — Encounter (HOSPITAL_COMMUNITY): Payer: Self-pay

## 2018-01-05 ENCOUNTER — Encounter (HOSPITAL_COMMUNITY): Payer: Self-pay

## 2018-01-07 ENCOUNTER — Encounter (HOSPITAL_COMMUNITY)
Admission: RE | Admit: 2018-01-07 | Discharge: 2018-01-07 | Disposition: A | Discharge status: Home / Self Care | Payer: Medicare Other | Source: Ambulatory Visit | Attending: Cardiology | Admitting: Cardiology

## 2018-01-09 ENCOUNTER — Encounter (HOSPITAL_COMMUNITY): Payer: Self-pay

## 2018-01-14 ENCOUNTER — Encounter (HOSPITAL_COMMUNITY)
Admission: RE | Admit: 2018-01-14 | Discharge: 2018-01-14 | Disposition: A | Discharge status: Home / Self Care | Payer: Self-pay | Source: Ambulatory Visit | Attending: Cardiology | Admitting: Cardiology

## 2018-01-14 DIAGNOSIS — Z48812 Encounter for surgical aftercare following surgery on the circulatory system: Secondary | ICD-10-CM | POA: Insufficient documentation

## 2018-01-14 DIAGNOSIS — Z955 Presence of coronary angioplasty implant and graft: Secondary | ICD-10-CM | POA: Insufficient documentation

## 2018-01-16 ENCOUNTER — Encounter (HOSPITAL_COMMUNITY)
Admission: RE | Admit: 2018-01-16 | Discharge: 2018-01-16 | Disposition: A | Discharge status: Home / Self Care | Payer: Self-pay | Source: Ambulatory Visit | Attending: Cardiology | Admitting: Cardiology

## 2018-01-19 ENCOUNTER — Encounter (HOSPITAL_COMMUNITY): Payer: Self-pay

## 2018-01-20 ENCOUNTER — Encounter: Payer: Self-pay | Admitting: Oncology

## 2018-01-20 ENCOUNTER — Telehealth: Payer: Self-pay | Admitting: Oncology

## 2018-01-20 NOTE — Telephone Encounter (Signed)
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 Pt has been scheduled to see Dr. Alen Blew on 9/26 at 2pm. Pt aware to arrive 30 minutes early. Letter mailed.

## 2018-01-21 ENCOUNTER — Encounter (HOSPITAL_COMMUNITY)
Admission: RE | Admit: 2018-01-21 | Discharge: 2018-01-21 | Disposition: A | Discharge status: Home / Self Care | Payer: Self-pay | Source: Ambulatory Visit | Attending: Cardiology | Admitting: Cardiology

## 2018-01-23 ENCOUNTER — Encounter (HOSPITAL_COMMUNITY): Payer: Self-pay

## 2018-01-26 ENCOUNTER — Encounter (HOSPITAL_COMMUNITY): Payer: Self-pay

## 2018-01-28 ENCOUNTER — Encounter (HOSPITAL_COMMUNITY)
Admission: RE | Admit: 2018-01-28 | Discharge: 2018-01-28 | Disposition: A | Discharge status: Home / Self Care | Payer: Self-pay | Source: Ambulatory Visit | Attending: Cardiology | Admitting: Cardiology

## 2018-01-30 ENCOUNTER — Encounter (HOSPITAL_COMMUNITY)
Admission: RE | Admit: 2018-01-30 | Discharge: 2018-01-30 | Disposition: A | Discharge status: Home / Self Care | Payer: Medicare Other | Source: Ambulatory Visit | Attending: Cardiology | Admitting: Cardiology

## 2018-02-02 ENCOUNTER — Encounter (HOSPITAL_COMMUNITY): Payer: Self-pay

## 2018-02-04 ENCOUNTER — Encounter (HOSPITAL_COMMUNITY)
Admission: RE | Admit: 2018-02-04 | Discharge: 2018-02-04 | Disposition: A | Discharge status: Home / Self Care | Payer: Self-pay | Source: Ambulatory Visit | Attending: Cardiology | Admitting: Cardiology

## 2018-02-05 ENCOUNTER — Telehealth: Payer: Self-pay | Admitting: Oncology

## 2018-02-05 ENCOUNTER — Inpatient Hospital Stay: Payer: Medicare Other | Attending: Oncology | Admitting: Oncology

## 2018-02-05 VITALS — BP 127/63 | HR 60 | Temp 98.0°F | Resp 17 | Ht 70.25 in | Wt 194.2 lb

## 2018-02-05 DIAGNOSIS — I2510 Atherosclerotic heart disease of native coronary artery without angina pectoris: Secondary | ICD-10-CM | POA: Diagnosis not present

## 2018-02-05 DIAGNOSIS — D472 Monoclonal gammopathy: Secondary | ICD-10-CM | POA: Diagnosis not present

## 2018-02-05 DIAGNOSIS — C4339 Malignant melanoma of other parts of face: Secondary | ICD-10-CM

## 2018-02-05 DIAGNOSIS — C439 Malignant melanoma of skin, unspecified: Secondary | ICD-10-CM

## 2018-02-05 DIAGNOSIS — Z8546 Personal history of malignant neoplasm of prostate: Secondary | ICD-10-CM | POA: Diagnosis not present

## 2018-02-05 NOTE — Telephone Encounter (Signed)
Gave pt avs and calendar  °

## 2018-02-05 NOTE — Progress Notes (Signed)
Reason for the request:   Melanoma  HPI: I was asked by Dr. Frederico Hamman to evaluate [name] for new diagnosis of melanoma.  [name] is a pleasant [name] with history of coronary disease as well as MGUS in addition to prostate cancer.  [name] was found to have a melanotic lesion on [name] forehead in March 2019.  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  [name] was subsequently evaluated by Dr. Frederico Hamman at [facility] cosmetic surgery and dermatology center and underwent wide excision completed on October 16, 2017.  The final pathology showed malignant melanoma with 2 mm thickness without ulceration.  The final pathological staging was T2 a with margins are involved although it is unclear what margins and with the level of involvement are.  [name] was subsequently evaluated by medical oncology at [facility] and it was recommended to have a repeat excision and staging imaging studies but did not follow-up at the time.  [name] had a follow-up with Dr. Frederico Hamman recently and was referred to me for further oncology follow-up and evaluation.  Clinically, [name] reports [name] is feeling well without any other complaints.  [name] denies any new skin rashes or lesions.  [name] denies any masses or any local recurrences around [name] scalp lesion.  [name] remains active and continues to attend to activities of daily living.  [name] does not report any headaches, blurry vision, syncope or seizures. Does not report any fevers, chills or sweats.  Does not report any cough, wheezing or hemoptysis.  Does not report any chest pain, palpitation, orthopnea or leg edema.  Does not report any nausea, vomiting or abdominal pain.  Does not report any constipation or diarrhea.  Does not report any skeletal complaints.    Does not report frequency, urgency or hematuria.  Does not report any skin rashes or lesions. Does not report any heat or cold intolerance.  Does  not report any lymphadenopathy or petechiae.  Does not report any anxiety or depression.  Remaining review of systems is negative.    Past Medical History:  Diagnosis Date  . Arthritis    "little in my right hand; some in my right big toe" (10/22/2016)  . Borderline diabetes    "tx'd w/RX; blood surgars dropped too low; dr. dc'd it 09/25/2016; blood sugars fine since then" (10/22/2016)  . Chronic kidney disease    S/P right nephrectomy in 1984  . History of gout 04/2016; 09/2016   RLE; RLE  . History of kidney stones   . Hyperlipidemia   . Hypertension   . MGUS (monoclonal gammopathy of unknown significance)   . Migraine    "none in years" (10/22/2016)  . Myeloma ([place])    "dormant for the last 2-3 years" (10/22/2016)  . NSTEMI (non-ST elevated myocardial infarction) ([place]) 09/25/2016  . 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 Prostate cancer ([place]) 2006   s/p brachytherapy  . Seborrheic keratosis   :  Past Surgical History:  Procedure Laterality Date  . CARDIAC CATHETERIZATION  1984  . CORONARY ANGIOPLASTY WITH STENT PLACEMENT  09/26/2016; 10/22/2016   "4 stents; 2 stents"  . CORONARY STENT INTERVENTION Right 09/26/2016   Procedure: Coronary Stent Intervention;  Surgeon: Adrian Prows, MD;  Location: [place] [facility];  Service: Cardiovascular;  Laterality: Right;  . CORONARY STENT INTERVENTION N/A 10/22/2016   Procedure: Coronary Stent Intervention;  Surgeon: Adrian Prows, MD;  Location: [place] [facility];  Service: Cardiovascular;  Laterality: N/A;  . INSERTION PROSTATE RADIATION SEED  2009?  Marland Kitchen LEFT HEART CATH AND CORONARY ANGIOGRAPHY N/A 09/26/2016   Procedure: Left Heart Cath and Coronary Angiography;  Surgeon: Adrian Prows, MD;  Location: [place] [facility];  Service: Cardiovascular;  Laterality: N/A;  . NEPHRECTOMY Right 1984   [place]  . REPAIR KNEE LIGAMENT  04/2016   "tore all the ligaments; had to put 3 pins in to reattach; Dr. Gladstone Lighter"  . TONSILLECTOMY     :   Current Outpatient Medications:  .  acetaminophen (TYLENOL) 500 MG tablet, Take 1,000 mg by mouth every 6 (six) hours as needed for moderate pain., Disp: , Rfl:  .  aspirin EC 81 MG tablet, Take 81 mg by mouth daily., Disp: , Rfl:  .  atorvastatin (LIPITOR) 80 MG tablet, Take 1 tablet (80 mg total) by mouth daily at 6 PM., Disp: 30 tablet, Rfl: 1 .  BYSTOLIC 20 MG TABS, Take 10 mg by mouth daily. daily, Disp: , Rfl:  .  glyBURIDE (DIABETA) 2.5 MG tablet, Take 1 tablet (2.5 mg total) by mouth daily with breakfast., Disp: 30 tablet, Rfl: 0 .  isosorbide mononitrate (IMDUR) 60 MG 24 hr tablet, Take 1 tablet (60 mg total) by mouth daily., Disp: 30 tablet, Rfl: 1 .  Misc Natural Products (TART CHERRY ADVANCED PO), Take 1 tablet by mouth daily., Disp: , Rfl:  .  nitroGLYCERIN (NITROSTAT) 0.4 MG SL tablet, Place 1 tablet (0.4 mg total) under the tongue every 5 (five) minutes x 3 doses as needed for chest pain., Disp: 25 tablet, Rfl: 1 .  Olmesartan-Amlodipine-HCTZ 20-5-12.5 MG TABS, Take by mouth., Disp: , Rfl:  .  Tetrahydrozoline HCl (VISINE OP), Place 1 drop into both eyes daily as needed (dry eyes)., Disp: , Rfl:  .  ticagrelor (BRILINTA) 90 MG TABS tablet, Take 1 tablet (90 mg total) by mouth 2 (two) times daily., Disp: 60 tablet, Rfl: 0:  Allergies  Allergen Reactions  . Sulfa Antibiotics Hives  . Lisinopril Cough  :  No family history on file.:  Social History   Socioeconomic History  . Marital status: Married    Spouse name: Not on file  . Number of children: Not on file  . Years of education: Not on file  . Highest education level: Not on file  Occupational History  . Not on file  Social Needs  . Financial resource strain: Not on file  . Food insecurity:    Worry: Not on file    Inability: Not on file  . Transportation needs:    Medical: Not on file    Non-medical: Not on file  Tobacco Use  . Smoking status: Never Smoker  . Smokeless tobacco: Never Used  Substance  and Sexual Activity  . Alcohol use: Yes    Comment: 10/22/2016 "might have a beer once/month"  . Drug use: No  . Sexual activity: Not Currently  Lifestyle  . Physical activity:    Days per week: Not on file    Minutes per session: Not on file  . Stress: Not on file  Relationships  . Social connections:    Talks on phone: Not on file    Gets together: Not on file    Attends religious service: Not on file    Active member of club or organization: Not on file    Attends meetings of clubs or organizations: Not on  file    Relationship status: Not on file  . Intimate partner violence:    Fear of current or ex partner: Not on file    Emotionally abused: Not on file    Physically abused: Not on file    Forced sexual activity: Not on file  Other Topics Concern  . Not on file  Social History Narrative  . Not on file  :  Pertinent items are noted in HPI.  Exam: Blood pressure 127/63, pulse 60, temperature 98 F (36.7 C), temperature source Oral, resp. rate 17, height 5' 10.25" (1.784 m), weight 194 lb 3.2 oz (88.1 kg), SpO2 98 %.  ECOG 1 General appearance: alert and cooperative appeared without distress. Head: atraumatic without any abnormalities. Eyes: conjunctivae/corneas clear. PERRL.  Sclera anicteric. Throat: lips, mucosa, and tongue normal; without oral thrush or ulcers. Resp: clear to auscultation bilaterally without rhonchi, wheezes or dullness to percussion. Cardio: regular rate and rhythm, S1, S2 normal, no murmur, click, rub or gallop GI: soft, non-tender; bowel sounds normal; no masses,  no organomegaly Skin: Skin color, texture, turgor normal. No rashes or lesions.  Healed scar noted on [name] forehead. Lymph nodes: Cervical, supraclavicular, and axillary nodes normal. Neurologic: Grossly normal without any motor, sensory or deep tendon reflexes. Musculoskeletal: No joint deformity or effusion.  CBC    Component Value Date/Time   WBC 7.0 10/23/2016 0308   RBC 3.90  (L) 10/23/2016 0308   HGB 11.3 (L) 10/23/2016 0308   HGB 14.7 10/26/2012 1011   HCT 35.2 (L) 10/23/2016 0308   HCT 43.2 10/26/2012 1011   PLT 153 10/23/2016 0308   PLT 167 10/26/2012 1011   MCV 90.3 10/23/2016 0308   MCV 89.7 10/26/2012 1011   MCH 29.0 10/23/2016 0308   MCHC 32.1 10/23/2016 0308   RDW 13.4 10/23/2016 0308   RDW 13.6 10/26/2012 1011   LYMPHSABS 1.7 10/26/2012 1011   MONOABS 0.6 10/26/2012 1011   EOSABS 0.2 10/26/2012 1011   BASOSABS 0.1 10/26/2012 1011     Chemistry      Component Value Date/Time   NA 139 10/23/2016 0308   NA 141 11/20/2012 1028   K 4.5 10/23/2016 0308   K 4.2 11/20/2012 1028   CL 107 10/23/2016 0308   CL 104 10/26/2012 1011   CO2 22 10/23/2016 0308   CO2 25 11/20/2012 1028   BUN 25 (H) 10/23/2016 0308   BUN 25.4 11/20/2012 1028   CREATININE 1.59 (H) 10/23/2016 0308   CREATININE 1.6 (H) 11/20/2012 1028      Component Value Date/Time   CALCIUM 8.5 (L) 10/23/2016 0308   CALCIUM 9.3 11/20/2012 1028   ALKPHOS 89 10/26/2012 1011   AST 13 10/26/2012 1011   ALT 14 10/26/2012 1011   BILITOT 0.55 10/26/2012 1011      Assessment and Plan:   77 year old man with the following:  1.  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  [name] underwent wide excision in June 2019 performed at the [facility] cosmetic surgery and dermatology center.  The final pathology showed a 2 mm thickness melanoma without ulceration but was potential positive margins.  [name] has no clinical signs and symptoms of recurrent disease at this time.  The natural course of this disease as well as treatment options were reviewed today in detail.  It is worrisome that is a questionable margin involvement and it is unclear to me whether adequate margins are taken at this time.  Given these findings, [name] may be  at risk of developing local recurrence and potential systemic relapse.  [name] did not have any lymph node sampling although clinical lymph nodes are negative.  From a management  standpoint, I have recommended obtaining a imaging studies with a PET scan for staging purposes.  I would like to also request [name] pathology specimen to be reviewed locally and determine whether indeed we are dealing with the margins at the time.  If [name] PET scan is completely negative and [name] margins are indeed positive based on our pathology review, [name] may require a wider excision and potentially sentinel lymph node sampling to complete a better staging work-up.  [name] says [name] has high risk disease and will continue to have vigilant monitoring at that time.  2.  Dermatology surveillance: I urged him to establish care with dermatology for routine skin examination.  We also have discussed strategies to protect [name] skin moving forward including long sleeve shirts, hats and appropriate sunblock.  3.  Prostate cancer: Remote history and followed by Dr. Amalia Hailey.  4.  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  5.  Follow-up: We will be in the next few months depending on results of [name] PET scan and pathology review.  60  minutes was spent with the [name] face-to-face today.  More than 50% of time was dedicated to reviewing the natural course of [name] disease, treatment options and coordinating [name] plan of care.   Thank you for the referral.  I had the pleasure of meeting this [name] today.  A copy of this consult has been forwarded to the requesting physician.

## 2018-02-06 ENCOUNTER — Encounter (HOSPITAL_COMMUNITY): Payer: Self-pay

## 2018-02-09 ENCOUNTER — Encounter (HOSPITAL_COMMUNITY)
Admission: RE | Admit: 2018-02-09 | Discharge: 2018-02-09 | Disposition: A | Discharge status: Home / Self Care | Payer: Self-pay | Source: Ambulatory Visit | Attending: Cardiology | Admitting: Cardiology

## 2018-02-11 ENCOUNTER — Encounter (HOSPITAL_COMMUNITY): Payer: Self-pay

## 2018-02-11 DIAGNOSIS — Z955 Presence of coronary angioplasty implant and graft: Secondary | ICD-10-CM | POA: Insufficient documentation

## 2018-02-11 DIAGNOSIS — Z48812 Encounter for surgical aftercare following surgery on the circulatory system: Secondary | ICD-10-CM | POA: Insufficient documentation

## 2018-02-13 ENCOUNTER — Encounter (HOSPITAL_COMMUNITY): Payer: Self-pay

## 2018-02-16 ENCOUNTER — Encounter (HOSPITAL_COMMUNITY): Payer: Self-pay

## 2018-02-18 ENCOUNTER — Encounter (HOSPITAL_COMMUNITY): Payer: Self-pay

## 2018-02-19 ENCOUNTER — Ambulatory Visit (HOSPITAL_COMMUNITY)
Admission: RE | Admit: 2018-02-19 | Discharge: 2018-02-19 | Disposition: A | Discharge status: Home / Self Care | Payer: Medicare Other | Source: Ambulatory Visit | Attending: Oncology | Admitting: Oncology

## 2018-02-19 DIAGNOSIS — C439 Malignant melanoma of skin, unspecified: Secondary | ICD-10-CM | POA: Insufficient documentation

## 2018-02-19 LAB — GLUCOSE, CAPILLARY: GLUCOSE-CAPILLARY: 81 mg/dL (ref 70–99)

## 2018-02-19 IMAGING — PT NM PET TUM IMG INITIAL (PI) WHOLE BODY
1 of 8 series · 3 of 25 positions shown · non-contrast
Comparison: None.

CLINICAL DATA: Initial treatment strategy for melanoma (forehead).
Initial diagnosis [DATE]. Wide excision [DATE].

EXAM:
NUCLEAR MEDICINE PET WHOLE BODY
TECHNIQUE: 9.84 mCi F-18 FDG was injected intravenously. Full-ring PET imaging
was performed from the skull base to thigh after the radiotracer. CT
data was obtained and used for attenuation correction and anatomic
localization.
Fasting blood glucose: 81 mg/dl

[Series 4: ct wb 5.0 hd_fov · axial · 5.0mm · 1.37mm/px · z∈[+486,+1422]mm · 3 of 469 slices shown]
[im 118/469  soft-tissue]
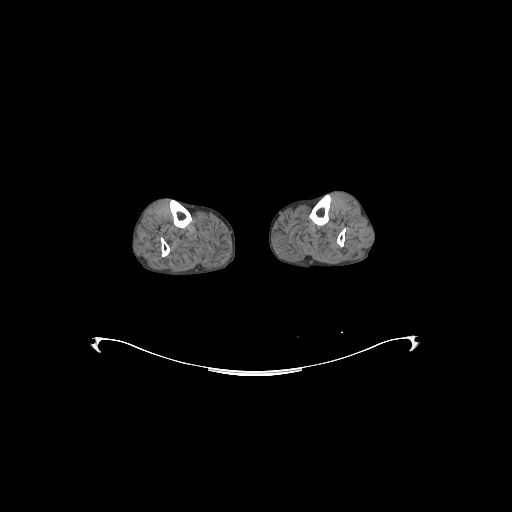
[im 235/469  soft-tissue]
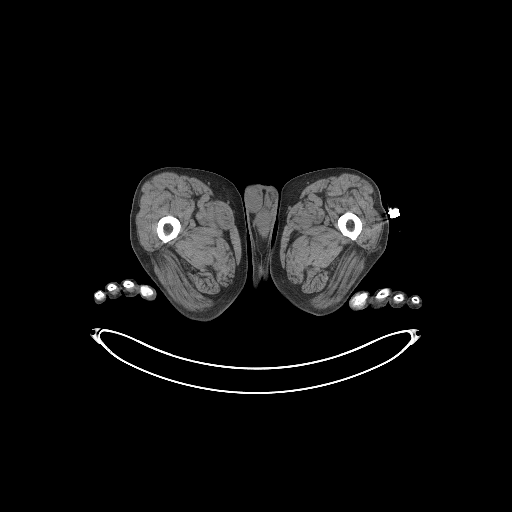
[im 352/469  soft-tissue]
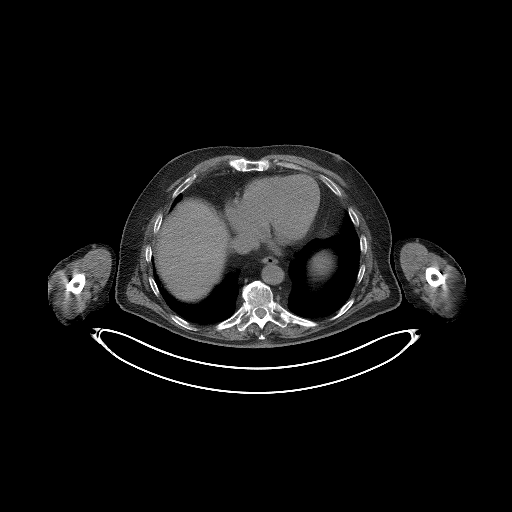

[3 of 25 positions shown; findings below may reference images not displayed]

FINDINGS: Mediastinal blood pool activity: SUV max

HEAD/NECK: No hypermetabolic activity in the scalp. No
hypermetabolic cervical lymph nodes.

Incidental CT findings: none

CHEST: No hypermetabolic mediastinal or hilar nodes. No suspicious
pulmonary nodules on the CT scan.

Incidental CT findings: Tortuosity, ectasia and calcification of the
thoracic aorta and branch vessel calcifications including dense
three-vessel coronary artery calcifications.

ABDOMEN/PELVIS: No abnormal hypermetabolic activity within the
liver, pancreas, adrenal glands, or spleen. No hypermetabolic lymph
nodes in the abdomen or pelvis.

Incidental CT findings: Status post right nephrectomy.

Advanced atherosclerotic calcifications involving the aorta and
iliac arteries.

Lower pole left renal calculus.

Sigmoid colon diverticulosis without findings for acute
diverticulitis.

Brachytherapy seeds are noted in the prostate gland.

SKELETON: No focal hypermetabolic activity to suggest skeletal
metastasis.

Incidental CT findings: none

EXTREMITIES: No abnormal hypermetabolic activity in the lower
extremities.

Incidental CT findings: Dense calcification involving the left
patellar tendon likely related to remote trauma with dystrophic
calcification. There are also calcifications in both distal
quadriceps tendons. There is slight hypermetabolism which may
suggest chronic inflammation.
IMPRESSION: Negative PET-CT. No hypermetabolism in the forehead area to suggest
residual metabolically active tumor. No findings for metastatic
disease in the neck, chest, abdomen or pelvis.

## 2018-02-19 MED ORDER — FLUDEOXYGLUCOSE F - 18 (FDG) INJECTION
9.8400 | Freq: Once | INTRAVENOUS | Status: AC | PRN
  Administered 2018-02-19: 9.84 via INTRAVENOUS

## 2018-02-20 ENCOUNTER — Encounter (HOSPITAL_COMMUNITY): Payer: Self-pay

## 2018-02-23 ENCOUNTER — Encounter (HOSPITAL_COMMUNITY): Payer: Self-pay

## 2018-02-25 ENCOUNTER — Telehealth: Payer: Self-pay

## 2018-02-25 ENCOUNTER — Encounter (HOSPITAL_COMMUNITY): Payer: Self-pay

## 2018-02-25 NOTE — Telephone Encounter (Signed)
Spoke with patient and made aware of the results. Patient appreciative of the call.

## 2018-02-25 NOTE — Telephone Encounter (Signed)
-----   Message from Wyatt Portela, MD sent at 02/24/2018  8:29 AM EDT ----- Please let him know his scan is normal. No cancer

## 2018-02-27 ENCOUNTER — Encounter (HOSPITAL_COMMUNITY): Payer: Self-pay

## 2018-03-02 ENCOUNTER — Encounter (HOSPITAL_COMMUNITY): Payer: Self-pay

## 2018-03-04 ENCOUNTER — Encounter (HOSPITAL_COMMUNITY)
Admission: RE | Admit: 2018-03-04 | Discharge: 2018-03-04 | Disposition: A | Discharge status: Home / Self Care | Payer: Self-pay | Source: Ambulatory Visit | Attending: Cardiology | Admitting: Cardiology

## 2018-03-06 ENCOUNTER — Encounter (HOSPITAL_COMMUNITY): Payer: Self-pay

## 2018-03-09 ENCOUNTER — Encounter (HOSPITAL_COMMUNITY): Payer: Self-pay

## 2018-03-11 ENCOUNTER — Encounter (HOSPITAL_COMMUNITY): Payer: Self-pay

## 2018-03-18 ENCOUNTER — Encounter (HOSPITAL_COMMUNITY)
Admission: RE | Admit: 2018-03-18 | Discharge: 2018-03-18 | Disposition: A | Discharge status: Home / Self Care | Payer: Self-pay | Source: Ambulatory Visit | Attending: Cardiology | Admitting: Cardiology

## 2018-03-18 DIAGNOSIS — Z955 Presence of coronary angioplasty implant and graft: Secondary | ICD-10-CM | POA: Insufficient documentation

## 2018-03-18 DIAGNOSIS — Z48812 Encounter for surgical aftercare following surgery on the circulatory system: Secondary | ICD-10-CM | POA: Insufficient documentation

## 2018-03-25 ENCOUNTER — Encounter (HOSPITAL_COMMUNITY)
Admission: RE | Admit: 2018-03-25 | Discharge: 2018-03-25 | Disposition: A | Discharge status: Home / Self Care | Payer: Self-pay | Source: Ambulatory Visit | Attending: Cardiology | Admitting: Cardiology

## 2018-03-27 ENCOUNTER — Encounter (HOSPITAL_COMMUNITY): Payer: Self-pay

## 2018-03-30 ENCOUNTER — Encounter (HOSPITAL_COMMUNITY): Payer: Self-pay

## 2018-04-01 ENCOUNTER — Encounter (HOSPITAL_COMMUNITY): Payer: Self-pay

## 2018-04-03 ENCOUNTER — Encounter (HOSPITAL_COMMUNITY): Payer: Self-pay

## 2018-04-06 ENCOUNTER — Encounter (HOSPITAL_COMMUNITY): Payer: Self-pay

## 2018-04-08 ENCOUNTER — Encounter (HOSPITAL_COMMUNITY): Payer: Self-pay

## 2018-04-13 ENCOUNTER — Encounter (HOSPITAL_COMMUNITY): Payer: Medicare Other | Attending: Cardiology

## 2018-04-13 DIAGNOSIS — Z955 Presence of coronary angioplasty implant and graft: Secondary | ICD-10-CM | POA: Insufficient documentation

## 2018-04-13 DIAGNOSIS — Z48812 Encounter for surgical aftercare following surgery on the circulatory system: Secondary | ICD-10-CM | POA: Insufficient documentation

## 2018-04-15 ENCOUNTER — Encounter (HOSPITAL_COMMUNITY): Payer: Medicare Other

## 2018-04-17 ENCOUNTER — Encounter (HOSPITAL_COMMUNITY): Payer: Medicare Other

## 2018-04-20 ENCOUNTER — Encounter (HOSPITAL_COMMUNITY): Payer: Medicare Other

## 2018-04-22 ENCOUNTER — Encounter (HOSPITAL_COMMUNITY): Payer: Medicare Other

## 2018-04-24 ENCOUNTER — Encounter (HOSPITAL_COMMUNITY): Payer: Medicare Other

## 2018-04-27 ENCOUNTER — Encounter (HOSPITAL_COMMUNITY): Payer: Medicare Other

## 2018-04-29 ENCOUNTER — Encounter (HOSPITAL_COMMUNITY): Payer: Medicare Other

## 2018-05-01 ENCOUNTER — Encounter (HOSPITAL_COMMUNITY): Payer: Medicare Other

## 2018-05-01 ENCOUNTER — Other Ambulatory Visit: Payer: Self-pay | Admitting: Orthopedic Surgery

## 2018-05-04 ENCOUNTER — Encounter (HOSPITAL_COMMUNITY): Payer: Medicare Other

## 2018-05-08 ENCOUNTER — Encounter (HOSPITAL_COMMUNITY): Payer: Medicare Other

## 2018-05-11 ENCOUNTER — Encounter (HOSPITAL_COMMUNITY): Payer: Medicare Other

## 2018-05-15 ENCOUNTER — Encounter (HOSPITAL_COMMUNITY): Payer: Medicare Other

## 2018-05-18 ENCOUNTER — Encounter (HOSPITAL_COMMUNITY): Payer: Medicare Other

## 2018-05-20 ENCOUNTER — Encounter (HOSPITAL_COMMUNITY): Payer: Medicare Other

## 2018-05-22 ENCOUNTER — Encounter (HOSPITAL_COMMUNITY): Payer: Medicare Other

## 2018-05-25 ENCOUNTER — Encounter (HOSPITAL_COMMUNITY): Payer: Medicare Other

## 2018-05-27 ENCOUNTER — Encounter (HOSPITAL_COMMUNITY): Payer: Medicare Other

## 2018-05-29 ENCOUNTER — Encounter (HOSPITAL_COMMUNITY): Payer: Medicare Other

## 2018-06-01 ENCOUNTER — Encounter (HOSPITAL_COMMUNITY): Payer: Medicare Other

## 2018-06-03 ENCOUNTER — Encounter (HOSPITAL_COMMUNITY): Payer: Medicare Other

## 2018-06-05 ENCOUNTER — Encounter (HOSPITAL_COMMUNITY): Payer: Medicare Other

## 2018-06-08 ENCOUNTER — Encounter (HOSPITAL_COMMUNITY): Payer: Medicare Other

## 2018-06-10 ENCOUNTER — Encounter (HOSPITAL_COMMUNITY): Payer: Medicare Other

## 2018-06-12 ENCOUNTER — Encounter (HOSPITAL_COMMUNITY): Payer: Medicare Other

## 2018-06-15 ENCOUNTER — Encounter (HOSPITAL_COMMUNITY): Payer: Medicare Other

## 2018-06-17 ENCOUNTER — Encounter (HOSPITAL_COMMUNITY): Payer: Medicare Other

## 2018-06-19 ENCOUNTER — Encounter (HOSPITAL_COMMUNITY): Payer: Medicare Other

## 2018-06-22 ENCOUNTER — Encounter (HOSPITAL_COMMUNITY): Payer: Medicare Other

## 2018-06-24 ENCOUNTER — Encounter (HOSPITAL_COMMUNITY): Payer: Medicare Other

## 2018-06-26 ENCOUNTER — Encounter (HOSPITAL_COMMUNITY): Payer: Medicare Other

## 2018-06-29 ENCOUNTER — Encounter (HOSPITAL_COMMUNITY): Payer: Medicare Other

## 2018-06-29 ENCOUNTER — Other Ambulatory Visit: Payer: Self-pay

## 2018-06-29 MED ORDER — ROSUVASTATIN CALCIUM 10 MG PO TABS: 5.0000 mg | ORAL_TABLET | Freq: Every day | ORAL | 1 refills | Status: DC

## 2018-07-01 ENCOUNTER — Encounter (HOSPITAL_COMMUNITY): Payer: Medicare Other

## 2018-07-03 ENCOUNTER — Encounter (HOSPITAL_COMMUNITY): Payer: Medicare Other

## 2018-07-06 ENCOUNTER — Encounter (HOSPITAL_COMMUNITY): Payer: Medicare Other

## 2018-07-08 ENCOUNTER — Encounter (HOSPITAL_COMMUNITY): Payer: Medicare Other

## 2018-07-10 ENCOUNTER — Encounter (HOSPITAL_COMMUNITY): Payer: Medicare Other

## 2018-07-13 ENCOUNTER — Encounter (HOSPITAL_COMMUNITY): Payer: Medicare Other

## 2018-07-15 ENCOUNTER — Encounter (HOSPITAL_COMMUNITY): Payer: Medicare Other

## 2018-07-17 ENCOUNTER — Encounter (HOSPITAL_COMMUNITY): Payer: Medicare Other

## 2018-07-20 ENCOUNTER — Encounter (HOSPITAL_COMMUNITY): Payer: Medicare Other

## 2018-07-21 ENCOUNTER — Other Ambulatory Visit: Payer: Self-pay

## 2018-07-21 MED ORDER — NITROGLYCERIN 0.4 MG SL SUBL: 0.4000 mg | SUBLINGUAL_TABLET | SUBLINGUAL | 1 refills | Status: DC | PRN

## 2018-07-22 ENCOUNTER — Encounter (HOSPITAL_COMMUNITY): Payer: Medicare Other

## 2018-07-24 ENCOUNTER — Encounter (HOSPITAL_COMMUNITY): Payer: Medicare Other

## 2018-07-26 ENCOUNTER — Other Ambulatory Visit: Payer: Self-pay | Admitting: Cardiology

## 2018-07-26 DIAGNOSIS — I25118 Atherosclerotic heart disease of native coronary artery with other forms of angina pectoris: Secondary | ICD-10-CM

## 2018-07-26 MED ORDER — NITROGLYCERIN 0.4 MG SL SUBL: 0.4000 mg | SUBLINGUAL_TABLET | SUBLINGUAL | 1 refills | Status: DC | PRN

## 2018-07-27 ENCOUNTER — Encounter (HOSPITAL_COMMUNITY): Payer: Medicare Other

## 2018-07-29 ENCOUNTER — Encounter (HOSPITAL_COMMUNITY): Payer: Medicare Other

## 2018-07-30 ENCOUNTER — Telehealth: Payer: Self-pay | Admitting: Oncology

## 2018-07-30 NOTE — Telephone Encounter (Signed)
R/s appt per 3/19 sch message - pt aware of appt date and time

## 2018-07-31 ENCOUNTER — Encounter (HOSPITAL_COMMUNITY): Payer: Medicare Other

## 2018-08-03 ENCOUNTER — Ambulatory Visit: Payer: Self-pay | Admitting: Cardiology

## 2018-08-03 ENCOUNTER — Encounter (HOSPITAL_COMMUNITY): Payer: Medicare Other

## 2018-08-05 ENCOUNTER — Encounter (HOSPITAL_COMMUNITY): Payer: Medicare Other

## 2018-08-06 ENCOUNTER — Ambulatory Visit: Payer: Medicare Other | Admitting: Oncology

## 2018-08-06 ENCOUNTER — Other Ambulatory Visit: Payer: Medicare Other

## 2018-08-07 ENCOUNTER — Encounter (HOSPITAL_COMMUNITY): Payer: Medicare Other

## 2018-08-10 ENCOUNTER — Encounter (HOSPITAL_COMMUNITY): Payer: Medicare Other

## 2018-08-12 ENCOUNTER — Encounter (HOSPITAL_COMMUNITY): Payer: Medicare Other

## 2018-08-14 ENCOUNTER — Encounter (HOSPITAL_COMMUNITY): Payer: Medicare Other

## 2018-08-17 ENCOUNTER — Encounter (HOSPITAL_COMMUNITY): Payer: Medicare Other

## 2018-08-19 ENCOUNTER — Encounter (HOSPITAL_COMMUNITY): Payer: Medicare Other

## 2018-08-21 ENCOUNTER — Encounter (HOSPITAL_COMMUNITY): Payer: Medicare Other

## 2018-08-24 ENCOUNTER — Encounter (HOSPITAL_COMMUNITY): Payer: Medicare Other

## 2018-08-26 ENCOUNTER — Encounter (HOSPITAL_COMMUNITY): Payer: Medicare Other

## 2018-08-28 ENCOUNTER — Encounter (HOSPITAL_COMMUNITY): Payer: Medicare Other

## 2018-08-31 ENCOUNTER — Encounter (HOSPITAL_COMMUNITY): Payer: Medicare Other

## 2018-09-02 ENCOUNTER — Encounter (HOSPITAL_COMMUNITY): Payer: Medicare Other

## 2018-09-04 ENCOUNTER — Encounter (HOSPITAL_COMMUNITY): Payer: Medicare Other

## 2018-09-07 ENCOUNTER — Ambulatory Visit: Payer: Medicare Other | Admitting: Cardiology

## 2018-09-07 ENCOUNTER — Encounter (HOSPITAL_COMMUNITY): Payer: Medicare Other

## 2018-09-09 ENCOUNTER — Encounter (HOSPITAL_COMMUNITY): Payer: Medicare Other

## 2018-10-19 ENCOUNTER — Other Ambulatory Visit: Payer: Self-pay

## 2018-10-19 MED ORDER — OLMESARTAN MEDOXOMIL-HCTZ 20-12.5 MG PO TABS: 1.0000 | ORAL_TABLET | Freq: Every day | ORAL | 0 refills | Status: DC

## 2018-10-29 ENCOUNTER — Other Ambulatory Visit: Payer: Medicare Other

## 2018-10-29 ENCOUNTER — Ambulatory Visit: Payer: Medicare Other | Admitting: Oncology

## 2018-11-06 ENCOUNTER — Other Ambulatory Visit: Payer: Self-pay

## 2018-11-06 MED ORDER — OLMESARTAN MEDOXOMIL-HCTZ 20-12.5 MG PO TABS: 0.5000 | ORAL_TABLET | Freq: Every day | ORAL | 0 refills | Status: DC

## 2018-11-16 ENCOUNTER — Other Ambulatory Visit: Payer: Self-pay

## 2018-11-16 MED ORDER — ISOSORBIDE MONONITRATE ER 60 MG PO TB24: 60.0000 mg | ORAL_TABLET | Freq: Every day | ORAL | 0 refills | Status: DC

## 2018-11-16 MED ORDER — AMLODIPINE BESYLATE 5 MG PO TABS: 5.0000 mg | ORAL_TABLET | Freq: Every day | ORAL | 0 refills | Status: DC

## 2018-11-17 ENCOUNTER — Ambulatory Visit: Payer: Medicare Other | Admitting: Cardiology

## 2018-11-17 ENCOUNTER — Ambulatory Visit (INDEPENDENT_AMBULATORY_CARE_PROVIDER_SITE_OTHER): Payer: Medicare Other | Admitting: Cardiology

## 2018-11-17 ENCOUNTER — Other Ambulatory Visit: Payer: Self-pay

## 2018-11-17 ENCOUNTER — Encounter: Payer: Self-pay | Admitting: Cardiology

## 2018-11-17 VITALS — BP 146/72 | HR 62 | Ht 72.0 in | Wt 196.0 lb

## 2018-11-17 DIAGNOSIS — I25118 Atherosclerotic heart disease of native coronary artery with other forms of angina pectoris: Secondary | ICD-10-CM | POA: Diagnosis not present

## 2018-11-17 DIAGNOSIS — R0609 Other forms of dyspnea: Secondary | ICD-10-CM | POA: Diagnosis not present

## 2018-11-17 DIAGNOSIS — Z905 Acquired absence of kidney: Secondary | ICD-10-CM

## 2018-11-17 DIAGNOSIS — E782 Mixed hyperlipidemia: Secondary | ICD-10-CM | POA: Diagnosis not present

## 2018-11-17 DIAGNOSIS — N183 Chronic kidney disease, stage 3 unspecified: Secondary | ICD-10-CM

## 2018-11-17 DIAGNOSIS — I129 Hypertensive chronic kidney disease with stage 1 through stage 4 chronic kidney disease, or unspecified chronic kidney disease: Secondary | ICD-10-CM

## 2018-11-17 DIAGNOSIS — I10 Essential (primary) hypertension: Secondary | ICD-10-CM

## 2018-11-17 DIAGNOSIS — E7800 Pure hypercholesterolemia, unspecified: Secondary | ICD-10-CM

## 2018-11-17 HISTORY — DX: Acquired absence of kidney: Z90.5

## 2018-11-17 MED ORDER — ROSUVASTATIN CALCIUM 10 MG PO TABS: 10.0000 mg | ORAL_TABLET | Freq: Every day | ORAL | 2 refills | Status: DC

## 2018-11-17 NOTE — Progress Notes (Signed)
Primary Physician/Referring:  Burnard Bunting, MD  Patient ID: [name], male    DOB: June 14, 1940, 78 y.o.   MRN: [id number]  Chief Complaint  Patient presents with  . Hypertension  . Follow-up    HPI: [name]  is a 78 y.o. male with h/o non-ST elevation myocardial infarctions in 09/2016 and underwent complex bifurcating the LAD and diagonal vessels with implantation of 4 drug-eluting stents, intervention to large dominant RCA stenosis on 10/22/2016.  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  Patient made this appointment to see me as he had one episode of chest pain that started about 4 days ago during the holiday weekend on July 4 for which she had to take a sublingual nitroglycerin.  Chest pain was located in the back radiation to the chest.  He has not had any recurrence since then.  Past Medical History:  Diagnosis Date  . Arthritis    "little in my right hand; some in my right big toe" (10/22/2016)  . Borderline diabetes    "tx'd w/RX; blood surgars dropped too low; dr. dc'd it 09/25/2016; blood sugars fine since then" (10/22/2016)  . Chronic kidney disease    S/P right nephrectomy in 1984  . History of gout 04/2016; 09/2016   RLE; RLE  . History of kidney stones   . Hyperlipidemia   . Hypertension   . MGUS (monoclonal gammopathy of unknown significance)   . Migraine    "none in years" (10/22/2016)  . Myeloma ([place])    "dormant for the last 2-3 years" (10/22/2016)  . NSTEMI (non-ST elevated myocardial infarction) ([place]) 09/25/2016  . 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 Prostate cancer ([place]) 2006   s/p brachytherapy  . Seborrheic keratosis   . 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   history of nephrectomy in 1984     Past Surgical History:  Procedure Laterality Date  . CARDIAC CATHETERIZATION  1984  . CORONARY ANGIOPLASTY WITH STENT PLACEMENT  09/26/2016; 10/22/2016   "4 stents; 2 stents"  . CORONARY STENT INTERVENTION Right 09/26/2016   Procedure: Coronary Stent Intervention;  Surgeon: Adrian Prows, MD;  Location: [facility];  Service: Cardiovascular;  Laterality: Right;  . CORONARY STENT INTERVENTION N/A 10/22/2016   Procedure: Coronary Stent Intervention;  Surgeon: Adrian Prows, MD;  Location: [facility];  Service: Cardiovascular;  Laterality: N/A;  . INSERTION PROSTATE RADIATION SEED  2009?  [facility] LEFT HEART CATH AND CORONARY ANGIOGRAPHY N/A 09/26/2016   Procedure: Left Heart Cath and Coronary Angiography;  Surgeon: Adrian Prows, MD;  Location: [facility];  Service: Cardiovascular;  Laterality: N/A;  . NEPHRECTOMY Right 1984   [facility]  . REPAIR KNEE LIGAMENT  04/2016   "tore all the ligaments; had to put 3 pins in to reattach; Dr. Gladstone Lighter"  . TONSILLECTOMY      Social History   Socioeconomic History  . Marital status: Married    Spouse name: Not on file  . Number of children: 3  . Years of education: Not on file  . Highest education level: Not on file  Occupational History  . Not on file  Social Needs  . Financial resource strain: Not on file  . Food insecurity    Worry: Not on file    Inability: Not  on file  . Transportation needs    Medical: Not on file    Non-medical: Not on file  Tobacco Use  . Smoking status: Never Smoker  . Smokeless tobacco: Never Used  Substance and Sexual Activity  . Alcohol use: Yes    Comment: very little   . Drug use: No  . Sexual activity: Not Currently  Lifestyle  . Physical activity    Days per week: Not on file    Minutes per session: Not on file  . Stress: Not on file  Relationships  . Social Herbalist on phone: Not on file    Gets together: Not on file    Attends religious service: Not on file    Active member of club  or organization: Not on file    Attends meetings of clubs or organizations: Not on file    Relationship status: Not on file  . Intimate partner violence    Fear of current or ex partner: Not on file    Emotionally abused: Not on file    Physically abused: Not on file    Forced sexual activity: Not on file  Other Topics Concern  . Not on file  Social History Narrative  . Not on file   Review of Systems  Constitution: Negative for chills, decreased appetite, malaise/fatigue and weight gain.  Cardiovascular: Negative for dyspnea on exertion (chronic and stable), leg swelling and syncope.  Endocrine: Negative for cold intolerance.  Hematologic/Lymphatic: Does not bruise/bleed easily.  Musculoskeletal: Negative for joint swelling.  Gastrointestinal: Negative for abdominal pain, anorexia, change in bowel habit, hematochezia and melena.  Neurological: Negative for headaches and light-headedness.  Psychiatric/Behavioral: Negative for depression and substance abuse.  All other systems reviewed and are negative.   Objective  Blood pressure (!) 146/72, pulse 62, height 6' (1.829 m), weight 196 lb (88.9 kg), SpO2 97 %. Body mass index is 26.58 kg/m.    Physical Exam  Constitutional: He appears well-developed and well-nourished. No distress.  HENT:  Head: Atraumatic.  Eyes: Conjunctivae are normal.  Neck: Neck supple. No JVD present. No thyromegaly present.  Cardiovascular: Normal rate, regular rhythm, normal heart sounds and intact distal pulses. Exam reveals no gallop.  No murmur heard. Pulmonary/Chest: Effort normal and breath sounds normal.  Abdominal: Soft. Bowel sounds are normal.  Musculoskeletal: Normal range of motion.  Neurological: He is alert.  Skin: Skin is warm and dry.  Psychiatric: He has a normal mood and affect.   Radiology: No results found.  Laboratory examination:   Labs 02/11/2018: Serum Glucose 91 Mg, BUN 36, Creatinine 2.1, EGFR 30 ML, Potassium 4.1, CMP  Otherwise Normal.  HB 13.6/HCT 43.9, Platelets 190, Normal Indicis.  Uric Acid Elevated at 9.6.  TSH Normal, PSA Normal.  Total Cholesterol 163, Triglycerides 163, HDL 39, LDL 91.  Non-HDL Cholesterol 124.  A1c 6.6%.  08/06/2017: Cholesterol 129, triglycerides 106, HDL 43, LDL 65. Creatinine 1.86, EGFR 34/39, potassium 4.3, CMP otherwise normal.  Labs 02/10/2017: Serum glucose 79 mg, BUN 29, creatinine 1.8, eGFR 36/44 mL, potassium 4.3, CMP otherwise normal. HB 13.9/HCT 42.2, platelets 179. Uric acid elevated at 10.7. TSH normal. PSA normal. Total cholesterol 141, triglycerides 173, HDL 36, LDL 70. Non-HDL cholesterol 105. A1c 6.5%.  CMP Latest Ref Rng & Units 10/23/2016 10/22/2016 09/27/2016  Glucose 65 - 99 mg/dL 147(H) 104(H) 135(H)  BUN 6 - 20 mg/dL 25(H) 28(H) 22(H)  Creatinine 0.61 - 1.24 mg/dL 1.59(H) 1.88(H) 1.70(H)  Sodium 135 -  145 mmol/L 139 140 138  Potassium 3.5 - 5.1 mmol/L 4.5 4.2 3.7  Chloride 101 - 111 mmol/L 107 108 106  CO2 22 - 32 mmol/L _0 Calcium 8.9 - 10.3 mg/dL 8.5(L) 9.2 8.0(L)  Total Protein 6.4 - 8.3 g/dL - - -  Total Bilirubin 0.20 - 1.20 mg/dL - - -  Alkaline Phos 40 - 150 U/L - - -  AST 5 - 34 U/L - - -  ALT 0 - 55 U/L - - -   CBC Latest Ref Rng & Units 10/23/2016 09/28/2016 09/27/2016  WBC 4.0 - 10.5 K/uL 7.0 9.5 9.1  Hemoglobin 13.0 - 17.0 g/dL 11.3(L) 11.6(L) 12.0(L)  Hematocrit 39.0 - 52.0 % 35.2(L) 35.6(L) 36.3(L)  Platelets 150 - 400 K/uL 153 152 144(L)   Lipid Panel     Component Value Date/Time   CHOL 199 09/25/2016 1243   TRIG 233 (H) 09/25/2016 1243   HDL 41 09/25/2016 1243   CHOLHDL 4.9 09/25/2016 1243   VLDL 47 (H) 09/25/2016 1243   LDLCALC 111 (H) 09/25/2016 1243   HEMOGLOBIN A1C Lab Results  Component Value Date   HGBA1C 7.5 (H) 09/25/2016   MPG 169 09/25/2016   TSH No results for input(s): TSH in the last 8760 hours.   Medications   Current Outpatient Medications  Medication Instructions  . acetaminophen (TYLENOL) 1,000  mg, Oral, Every 6 hours PRN  . amLODipine (NORVASC) 5 mg, Oral, Daily  . aspirin EC 81 mg, Oral, Daily  . Bystolic 10 mg, Oral, Daily, daily  . glyBURIDE (DIABETA) 2.5 mg, Oral, Daily with breakfast  . isosorbide mononitrate (IMDUR) 60 mg, Oral, Daily  . nitroGLYCERIN (NITROSTAT) 0.4 mg, Sublingual, Every 5 min x3 PRN  . olmesartan-hydrochlorothiazide (BENICAR HCT) 20-12.5 MG tablet 0.5 tablets, Oral, Daily  . rosuvastatin (CRESTOR) 10 mg, Oral, Daily  . Tetrahydrozoline HCl (VISINE OP) 1 drop, Both Eyes, Daily PRN    Cardiac Studies:   Coronary angiogram 10/22/2016: stenting distal RCA with 3.0 x 18 and ostial RCA with 4.0 x 18 mm Promus premier DES.  History of PTCA 09/26/16:  LAD and large D2 subtotally occluded S/P crush stenting of the bifurcating LAD & D1 with 2.0 x 26 mm resolute onyx distally and 3.0 x 26 mm resolute onyx proximally into the large  D1 extending into the mid LAD crushing the mid LAD 2.5 x 18 mm resolute onyx stent distal to the D1 origin, Proximal LAD stented with an additional 3.0 x 15 mm resolute onyx stent.  Echocardiogram 06/10/2017: Left ventricle cavity is normal in size. Mild concentric hypertrophy of the left ventricle. Normal global wall motion. Doppler evidence of grade I (impaired) diastolic dysfunction, normal LAP. Calculated EF 56%. Left atrial cavity is mildly dilated. Structurally normal mitral valve with mild (Grade I) regurgitation. IVC is dilated with respiratory variation. This may suggest elevated right atrial pressure.  Assessment   1. Coronary artery disease of native artery of native heart with stable angina pectoris ([place])   2. Dyspnea on exertion   3. Mixed hyperlipidemia   4. Essential hypertension   5. Hypercholesteremia   6. CKD (chronic kidney disease) stage 3, GFR 30-59 ml/min ([facility])    Orders Placed This Encounter  Procedures  . Lipid Panel With LDL/HDL Ratio    Standing Status:   Future    Number of Occurrences:   1    Standing  Expiration Date:   11/17/2019  . COMPLETE METABOLIC PANEL WITH GFR  Standing Status:   Future    Number of Occurrences:   1    Standing Expiration Date:   11/17/2019  . EKG 12-Lead   EKG 11/17/2018: Marked sinus bradycardia at rate of 56 bpm with first-degree AV block. PRi = 238.  Normal axis, no evidence of ischemia, otherwise normal EKG. No significant change from  EKG 02/02/9018: Sinus bradycardia at rate of 49 bpm with first-degree AV block, otherwise normal EKG.   Recommendations:   Patient had one episode of angina pectoris about 4 days ago for which he took a nitroglycerin.  I simply reassured him and advised him that we expect angina occasionally.  Otherwise he continues to remain active, he is asymptomatic marked sinus bradycardia unchanged EKG from prior EKG almost 6 months ago.  He his lipids were reviewed and his labs from the PCP weight reviewed and updated.  Lipids are not controlled for his CAD.  He had previously tried atorvastatin caused him to have fatigue, he had also tried Crestor, all day to rechallenge him with Crestor 10 mg daily.  We'll repeat CMP along with lipid profile testing in 2 months.  With regard to chronic renal failure, he now has stage IIIB chronic kidney disease but remained stable from cardiac standpoint and no clinical evidence of heart failure, I'll see him back in 6 months or sooner if problems.  Adrian Prows, MD, [facility] 11/17/2018, 2:14 PM [facility] [place] Pager: [phone] Office: [phone] If no answer Cell [phone]

## 2018-11-29 ENCOUNTER — Emergency Department (HOSPITAL_COMMUNITY): Payer: Medicare Other

## 2018-11-29 ENCOUNTER — Emergency Department (HOSPITAL_COMMUNITY): Payer: Medicare Other | Admitting: Anesthesiology

## 2018-11-29 ENCOUNTER — Inpatient Hospital Stay (HOSPITAL_COMMUNITY): Payer: Medicare Other

## 2018-11-29 ENCOUNTER — Encounter (HOSPITAL_COMMUNITY)
Admission: EM | Disposition: A | Discharge status: Home / Self Care | Payer: Self-pay | Source: Home / Self Care | Attending: Internal Medicine

## 2018-11-29 ENCOUNTER — Inpatient Hospital Stay (HOSPITAL_COMMUNITY)
Admission: EM | Admit: 2018-11-29 | Discharge: 2018-12-02 | DRG: 660 | Disposition: A | Discharge status: Home / Self Care | Payer: Medicare Other | Attending: Internal Medicine | Admitting: Internal Medicine

## 2018-11-29 ENCOUNTER — Other Ambulatory Visit: Payer: Self-pay

## 2018-11-29 ENCOUNTER — Encounter (HOSPITAL_COMMUNITY): Payer: Self-pay

## 2018-11-29 DIAGNOSIS — Z905 Acquired absence of kidney: Secondary | ICD-10-CM

## 2018-11-29 DIAGNOSIS — N132 Hydronephrosis with renal and ureteral calculous obstruction: Secondary | ICD-10-CM | POA: Diagnosis present

## 2018-11-29 DIAGNOSIS — I252 Old myocardial infarction: Secondary | ICD-10-CM | POA: Diagnosis not present

## 2018-11-29 DIAGNOSIS — I129 Hypertensive chronic kidney disease with stage 1 through stage 4 chronic kidney disease, or unspecified chronic kidney disease: Secondary | ICD-10-CM | POA: Diagnosis present

## 2018-11-29 DIAGNOSIS — Z8249 Family history of ischemic heart disease and other diseases of the circulatory system: Secondary | ICD-10-CM | POA: Diagnosis not present

## 2018-11-29 DIAGNOSIS — R109 Unspecified abdominal pain: Secondary | ICD-10-CM | POA: Diagnosis present

## 2018-11-29 DIAGNOSIS — N183 Chronic kidney disease, stage 3 (moderate): Secondary | ICD-10-CM | POA: Diagnosis present

## 2018-11-29 DIAGNOSIS — N189 Chronic kidney disease, unspecified: Secondary | ICD-10-CM | POA: Diagnosis present

## 2018-11-29 DIAGNOSIS — Z882 Allergy status to sulfonamides status: Secondary | ICD-10-CM

## 2018-11-29 DIAGNOSIS — N3289 Other specified disorders of bladder: Secondary | ICD-10-CM | POA: Diagnosis present

## 2018-11-29 DIAGNOSIS — Z885 Allergy status to narcotic agent status: Secondary | ICD-10-CM

## 2018-11-29 DIAGNOSIS — Z888 Allergy status to other drugs, medicaments and biological substances status: Secondary | ICD-10-CM

## 2018-11-29 DIAGNOSIS — Z8546 Personal history of malignant neoplasm of prostate: Secondary | ICD-10-CM | POA: Diagnosis not present

## 2018-11-29 DIAGNOSIS — E1169 Type 2 diabetes mellitus with other specified complication: Secondary | ICD-10-CM | POA: Diagnosis present

## 2018-11-29 DIAGNOSIS — Z1159 Encounter for screening for other viral diseases: Secondary | ICD-10-CM | POA: Diagnosis not present

## 2018-11-29 DIAGNOSIS — N200 Calculus of kidney: Secondary | ICD-10-CM

## 2018-11-29 DIAGNOSIS — E1122 Type 2 diabetes mellitus with diabetic chronic kidney disease: Secondary | ICD-10-CM | POA: Diagnosis present

## 2018-11-29 DIAGNOSIS — N179 Acute kidney failure, unspecified: Secondary | ICD-10-CM | POA: Diagnosis present

## 2018-11-29 DIAGNOSIS — Z79899 Other long term (current) drug therapy: Secondary | ICD-10-CM

## 2018-11-29 DIAGNOSIS — T8384XA Pain from genitourinary prosthetic devices, implants and grafts, initial encounter: Secondary | ICD-10-CM | POA: Diagnosis not present

## 2018-11-29 DIAGNOSIS — Z87442 Personal history of urinary calculi: Secondary | ICD-10-CM

## 2018-11-29 DIAGNOSIS — N184 Chronic kidney disease, stage 4 (severe): Secondary | ICD-10-CM | POA: Diagnosis present

## 2018-11-29 DIAGNOSIS — M1990 Unspecified osteoarthritis, unspecified site: Secondary | ICD-10-CM | POA: Diagnosis present

## 2018-11-29 DIAGNOSIS — Y846 Urinary catheterization as the cause of abnormal reaction of the patient, or of later complication, without mention of misadventure at the time of the procedure: Secondary | ICD-10-CM | POA: Diagnosis not present

## 2018-11-29 DIAGNOSIS — Z7984 Long term (current) use of oral hypoglycemic drugs: Secondary | ICD-10-CM

## 2018-11-29 DIAGNOSIS — I25118 Atherosclerotic heart disease of native coronary artery with other forms of angina pectoris: Secondary | ICD-10-CM | POA: Diagnosis present

## 2018-11-29 DIAGNOSIS — E785 Hyperlipidemia, unspecified: Secondary | ICD-10-CM | POA: Diagnosis present

## 2018-11-29 DIAGNOSIS — Z955 Presence of coronary angioplasty implant and graft: Secondary | ICD-10-CM

## 2018-11-29 DIAGNOSIS — Z85528 Personal history of other malignant neoplasm of kidney: Secondary | ICD-10-CM | POA: Diagnosis not present

## 2018-11-29 DIAGNOSIS — Z7982 Long term (current) use of aspirin: Secondary | ICD-10-CM

## 2018-11-29 DIAGNOSIS — I2510 Atherosclerotic heart disease of native coronary artery without angina pectoris: Secondary | ICD-10-CM | POA: Diagnosis present

## 2018-11-29 DIAGNOSIS — Z923 Personal history of irradiation: Secondary | ICD-10-CM | POA: Diagnosis not present

## 2018-11-29 DIAGNOSIS — Z66 Do not resuscitate: Secondary | ICD-10-CM | POA: Diagnosis present

## 2018-11-29 DIAGNOSIS — N138 Other obstructive and reflux uropathy: Secondary | ICD-10-CM

## 2018-11-29 DIAGNOSIS — Z8579 Personal history of other malignant neoplasms of lymphoid, hematopoietic and related tissues: Secondary | ICD-10-CM | POA: Diagnosis not present

## 2018-11-29 DIAGNOSIS — I10 Essential (primary) hypertension: Secondary | ICD-10-CM | POA: Diagnosis not present

## 2018-11-29 HISTORY — PX: HOLMIUM LASER APPLICATION: SHX5852

## 2018-11-29 HISTORY — PX: CYSTOSCOPY/URETEROSCOPY/HOLMIUM LASER/STENT PLACEMENT: SHX6546

## 2018-11-29 LAB — CBC WITH DIFFERENTIAL/PLATELET
Abs Immature Granulocytes: 0.01 10*3/uL (ref 0.00–0.07)
Basophils Absolute: 0 10*3/uL (ref 0.0–0.1)
Basophils Relative: 0 %
Eosinophils Absolute: 0.2 10*3/uL (ref 0.0–0.5)
Eosinophils Relative: 2 %
HCT: 44.1 % (ref 39.0–52.0)
Hemoglobin: 14.5 g/dL (ref 13.0–17.0)
Immature Granulocytes: 0 %
Lymphocytes Relative: 24 %
Lymphs Abs: 2.2 10*3/uL (ref 0.7–4.0)
MCH: 30.2 pg (ref 26.0–34.0)
MCHC: 32.9 g/dL (ref 30.0–36.0)
MCV: 91.9 fL (ref 80.0–100.0)
Monocytes Absolute: 0.8 10*3/uL (ref 0.1–1.0)
Monocytes Relative: 8 %
Neutro Abs: 6 10*3/uL (ref 1.7–7.7)
Neutrophils Relative %: 66 %
Platelets: 163 10*3/uL (ref 150–400)
RBC: 4.8 MIL/uL (ref 4.22–5.81)
RDW: 12.8 % (ref 11.5–15.5)
WBC: 9.2 10*3/uL (ref 4.0–10.5)
nRBC: 0 % (ref 0.0–0.2)

## 2018-11-29 LAB — URINALYSIS, ROUTINE W REFLEX MICROSCOPIC
Bilirubin Urine: NEGATIVE
Glucose, UA: NEGATIVE mg/dL
Ketones, ur: NEGATIVE mg/dL
Leukocytes,Ua: NEGATIVE
Nitrite: NEGATIVE
Protein, ur: NEGATIVE mg/dL
Specific Gravity, Urine: <1.005 — ABNORMAL LOW (ref 1.005–1.030)
pH: 5 (ref 5.0–8.0)

## 2018-11-29 LAB — COMPREHENSIVE METABOLIC PANEL
ALT: 23 U/L (ref 0–44)
AST: 22 U/L (ref 15–41)
Albumin: 4 g/dL (ref 3.5–5.0)
Alkaline Phosphatase: 85 U/L (ref 38–126)
Anion gap: 14 (ref 5–15)
BUN: 60 mg/dL — ABNORMAL HIGH (ref 8–23)
CO2: 21 mmol/L — ABNORMAL LOW (ref 22–32)
Calcium: 8.9 mg/dL (ref 8.9–10.3)
Chloride: 105 mmol/L (ref 98–111)
Creatinine, Ser: 5.92 mg/dL — ABNORMAL HIGH (ref 0.61–1.24)
GFR calc Af Amer: 10 mL/min — ABNORMAL LOW (ref >60)
GFR calc non Af Amer: 8 mL/min — ABNORMAL LOW (ref >60)
Glucose, Bld: 248 mg/dL — ABNORMAL HIGH (ref 70–99)
Potassium: 4.6 mmol/L (ref 3.5–5.1)
Sodium: 140 mmol/L (ref 135–145)
Total Bilirubin: 0.6 mg/dL (ref 0.3–1.2)
Total Protein: 6.5 g/dL (ref 6.5–8.1)

## 2018-11-29 LAB — URINALYSIS, MICROSCOPIC (REFLEX): Bacteria, UA: NONE SEEN

## 2018-11-29 LAB — SARS CORONAVIRUS 2 BY RT PCR (HOSPITAL ORDER, PERFORMED IN ~~LOC~~ HOSPITAL LAB): SARS Coronavirus 2: NEGATIVE

## 2018-11-29 LAB — GLUCOSE, CAPILLARY: Glucose-Capillary: 186 mg/dL — ABNORMAL HIGH (ref 70–99)

## 2018-11-29 IMAGING — CT CT RENAL STONE PROTOCOL
2 of 4 series · 16 of 46 positions shown, 18 images · non-contrast
Comparison: PET-CT dated [DATE].

CLINICAL DATA: Blank pain and abdominal pain since yesterday.
Hematuria. History of prostate cancer and right nephrectomy. History
of nephrolithiasis greater than 40 years ago.

EXAM:
CT ABDOMEN AND PELVIS WITHOUT CONTRAST
TECHNIQUE: Multidetector CT imaging of the abdomen and pelvis was performed
following the standard protocol without IV contrast.

[Series 2: axial st · axial · 0.85mm/px · z∈[-429,-24]mm · 13 of 93 slices shown, 15 images]
[im 6/93  soft-tissue]
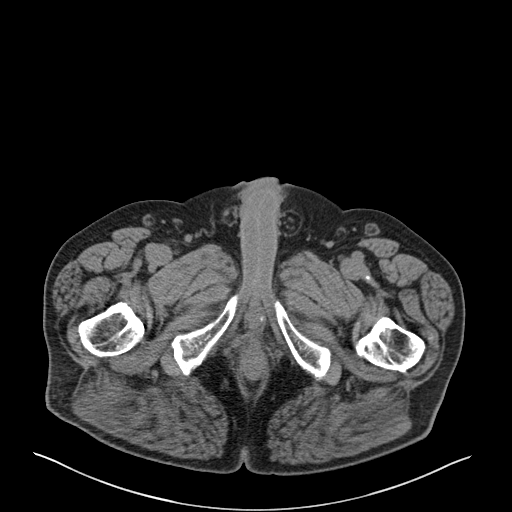
[im 6/93  bone]
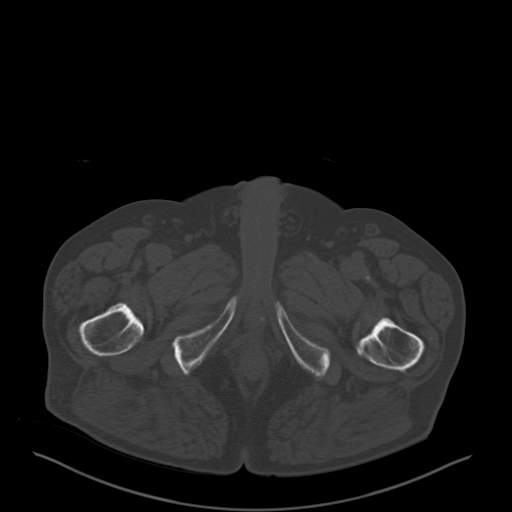
[im 11/93  soft-tissue]
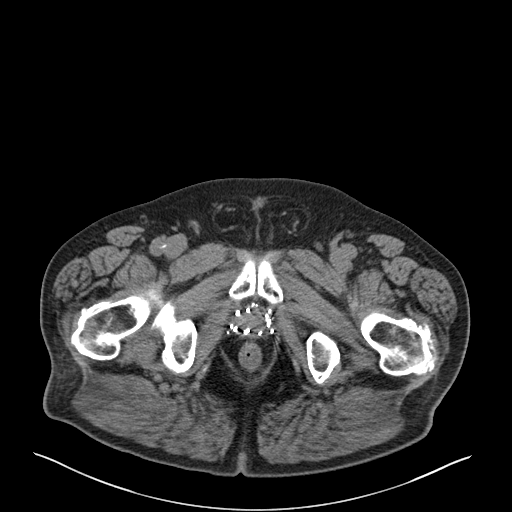
[im 21/93  soft-tissue]
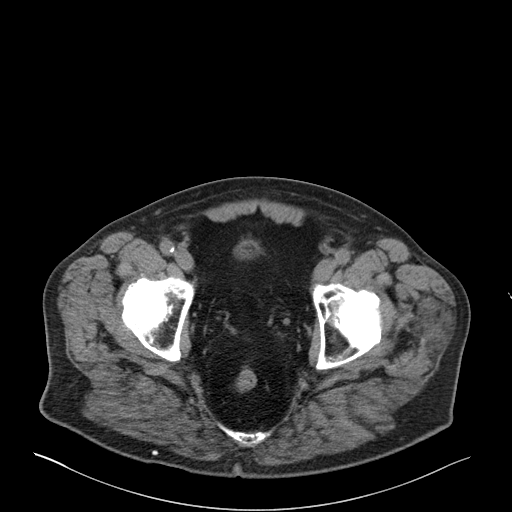
[im 26/93  soft-tissue]
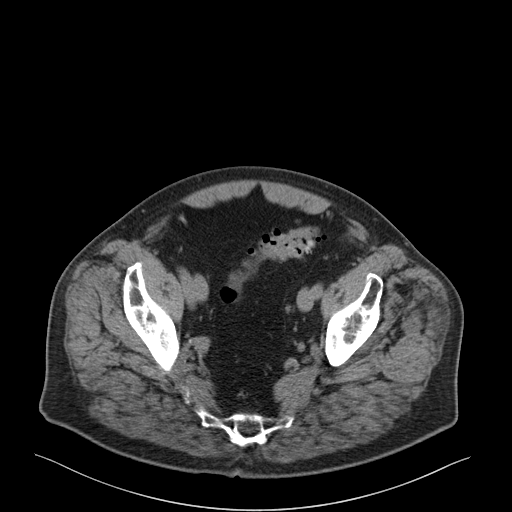
[im 31/93  soft-tissue]
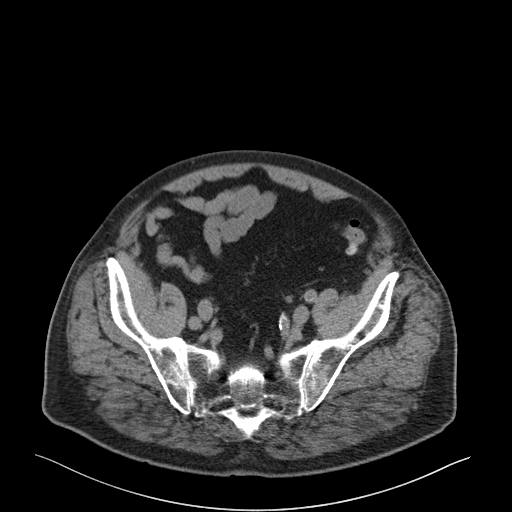
[im 41/93  soft-tissue]
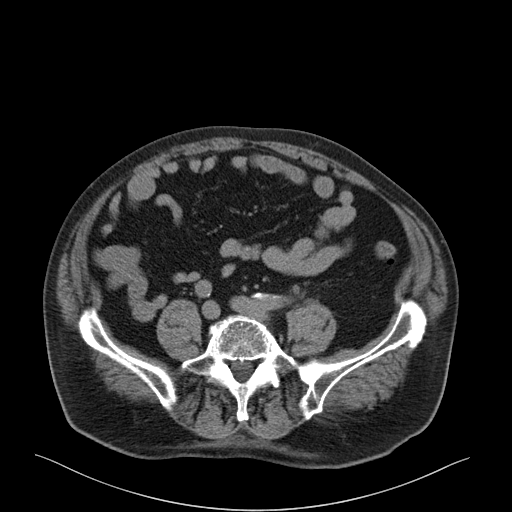
[im 47/93  soft-tissue]
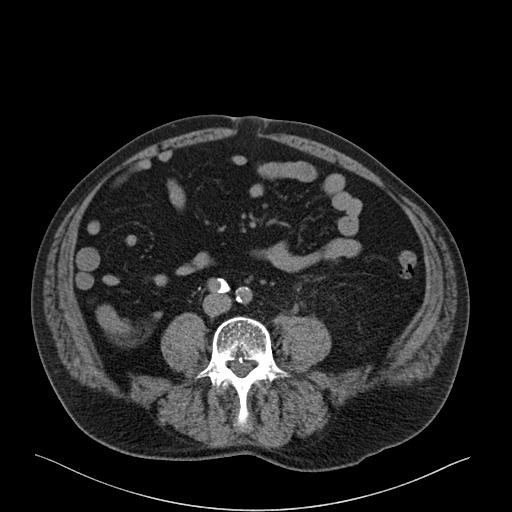
[im 52/93  soft-tissue]
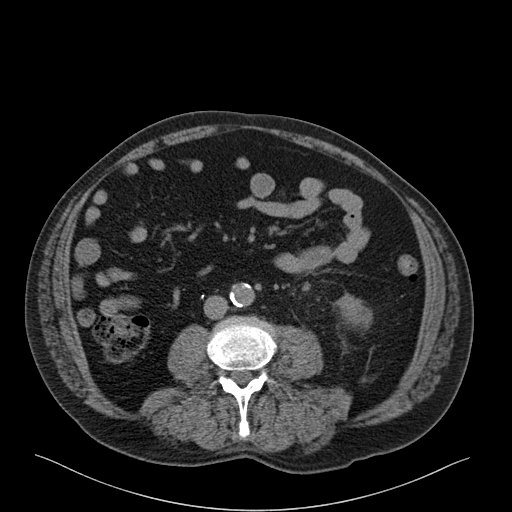
[im 62/93  soft-tissue]
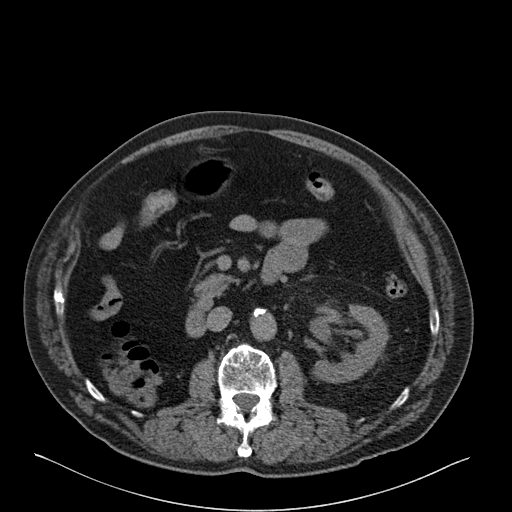
[im 62/93  bone]
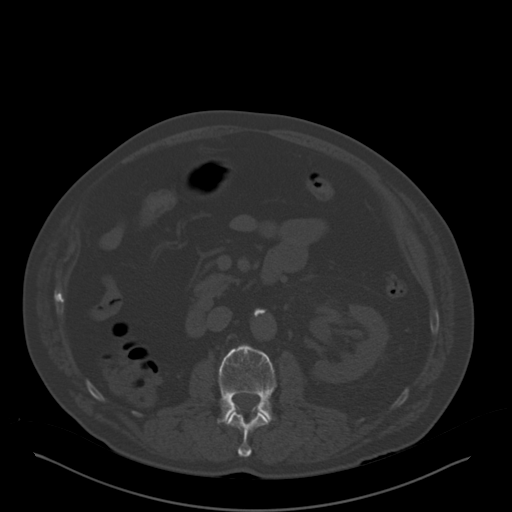
[im 67/93  soft-tissue]
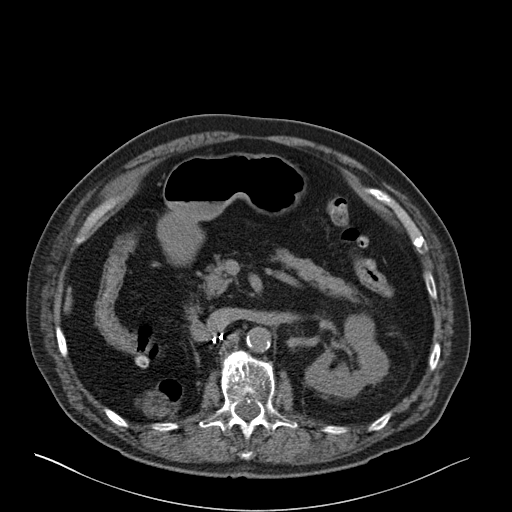
[im 72/93  soft-tissue]
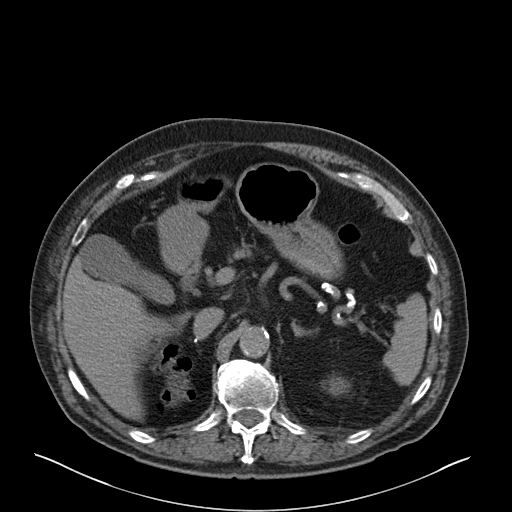
[im 82/93  soft-tissue]
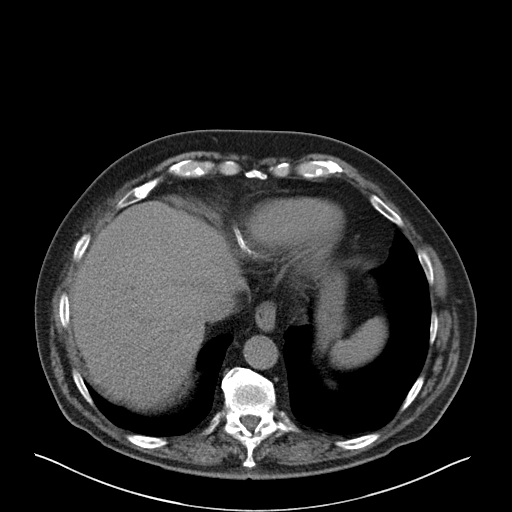
[im 87/93  soft-tissue]
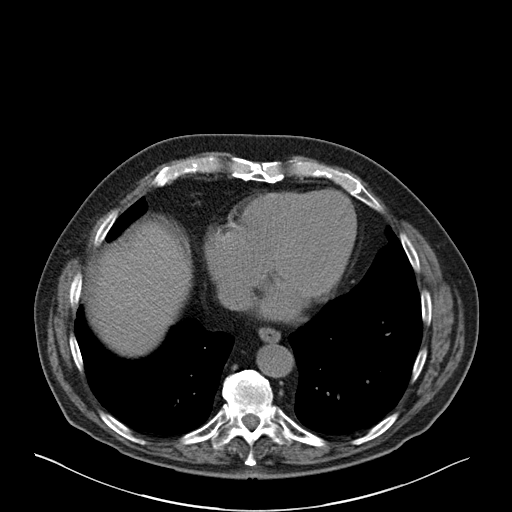

[Series 5: coronal · coronal · 0.77mm/px · 3 of 151 slices shown]
[im 51/151  soft-tissue]
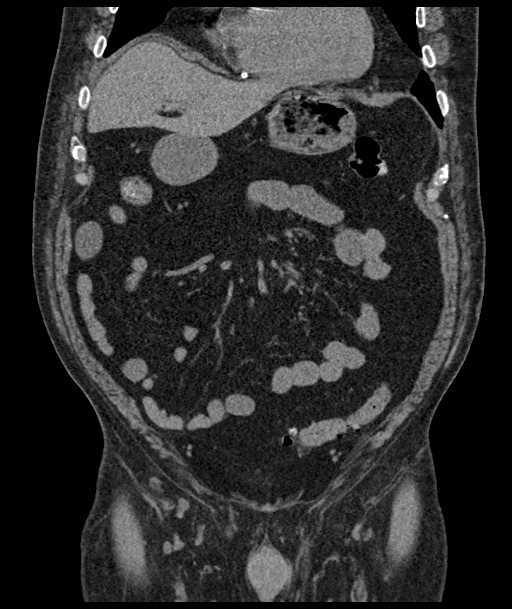
[im 67/151  soft-tissue]
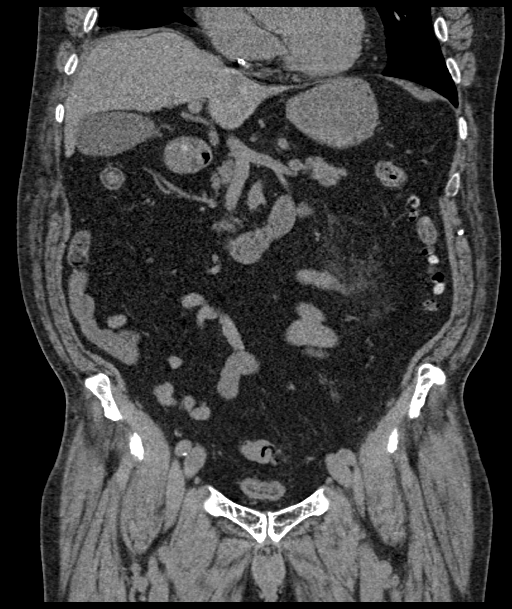
[im 84/151  soft-tissue]
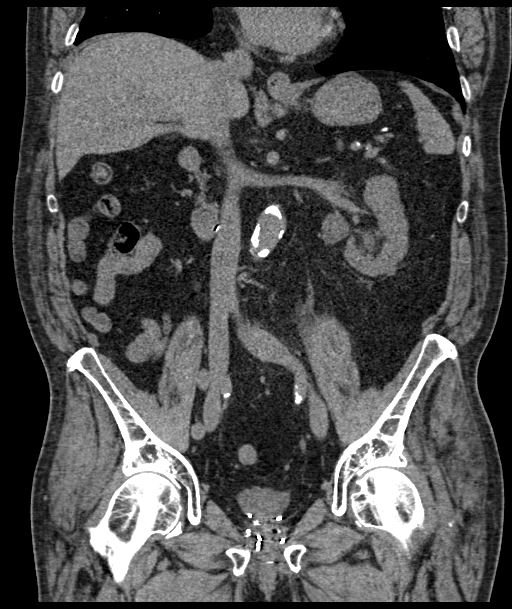

[16 of 46 positions shown; findings below may reference images not displayed]

FINDINGS: Lower chest: Right coronary artery stent. Mildly enlarged heart.
Clear lung bases.

Hepatobiliary: No focal liver abnormality is seen. No gallstones,
gallbladder wall thickening, or biliary dilatation.

Pancreas: Unremarkable. No pancreatic ductal dilatation or
surrounding inflammatory changes.

Spleen: Normal in size without focal abnormality.

Adrenals/Urinary Tract: Stable post nephrectomy changes on the
right. Normal appearing adrenal glands. Interval mild dilatation of
the left renal collecting system and ureter to the level of a 5 mm
calculus in the distal ureter, just proximal to the ureterovesical
junction. Mild left perinephric and periureteric soft tissue
stranding. This represents the previously demonstrated lower pole
left renal calculus, no longer seen in the kidney. No significant
urine in the bladder. No bladder calculi seen

Stomach/Bowel: Multiple colonic diverticula without evidence of
diverticulitis. Unremarkable stomach, small bowel and appendix.

Vascular/Lymphatic: Atheromatous arterial calcifications without
aneurysm. No enlarged lymph nodes.

Reproductive: Prostate radiation seed implants.

Other: Small bilateral inguinal hernias containing fat. Small
umbilical hernia containing fat.

Musculoskeletal: Lumbar and lower thoracic spine degenerative
changes. No evidence of bony metastatic disease.
IMPRESSION: 1. 5 mm distal left ureteral calculus causing mild left
hydronephrosis and hydroureter.
2. Colonic diverticulosis.

## 2018-11-29 SURGERY — CYSTOSCOPY/URETEROSCOPY/HOLMIUM LASER/STENT PLACEMENT
Anesthesia: General | Site: Ureter | Laterality: Left

## 2018-11-29 MED ORDER — DEXAMETHASONE SODIUM PHOSPHATE 10 MG/ML IJ SOLN
INTRAMUSCULAR | Status: DC | PRN
  Administered 2018-11-29: 4 mg via INTRAVENOUS

## 2018-11-29 MED ORDER — MIDAZOLAM HCL 5 MG/5ML IJ SOLN
INTRAMUSCULAR | Status: DC | PRN
  Administered 2018-11-29 (×2): 1 mg via INTRAVENOUS

## 2018-11-29 MED ORDER — ONDANSETRON HCL 4 MG/2ML IJ SOLN: 4.0000 mg | Freq: Once | INTRAMUSCULAR | Status: DC | PRN

## 2018-11-29 MED ORDER — LACTATED RINGERS IV SOLN
INTRAVENOUS | Status: DC | PRN
  Administered 2018-11-29: 17:00:00 via INTRAVENOUS

## 2018-11-29 MED ORDER — BELLADONNA ALKALOIDS-OPIUM 16.2-60 MG RE SUPP
RECTAL | Status: DC | PRN
  Administered 2018-11-29: 1 via RECTAL

## 2018-11-29 MED ORDER — ONDANSETRON HCL 4 MG/2ML IJ SOLN
4.0000 mg | Freq: Once | INTRAMUSCULAR | Status: AC
  Administered 2018-11-29: 14:00:00 4 mg via INTRAVENOUS
  Filled 2018-11-29: qty 2

## 2018-11-29 MED ORDER — LACTATED RINGERS IV SOLN
INTRAVENOUS | Status: DC
  Administered 2018-11-29 – 2018-11-30 (×2): via INTRAVENOUS

## 2018-11-29 MED ORDER — INSULIN ASPART 100 UNIT/ML ~~LOC~~ SOLN: 0.0000 [IU] | Freq: Every day | SUBCUTANEOUS | Status: DC

## 2018-11-29 MED ORDER — ATORVASTATIN CALCIUM 40 MG PO TABS
80.0000 mg | ORAL_TABLET | Freq: Every evening | ORAL | Status: DC
  Administered 2018-11-29 – 2018-12-01 (×3): 80 mg via ORAL
  Filled 2018-11-29 (×3): qty 2

## 2018-11-29 MED ORDER — ISOSORBIDE MONONITRATE ER 60 MG PO TB24
60.0000 mg | ORAL_TABLET | Freq: Every day | ORAL | Status: DC
  Administered 2018-11-30 – 2018-12-02 (×3): 60 mg via ORAL
  Filled 2018-11-29 (×3): qty 1

## 2018-11-29 MED ORDER — SODIUM CHLORIDE 0.9 % IV SOLN
INTRAVENOUS | Status: DC | PRN
  Administered 2018-11-29: 18:00:00 25 ug/min via INTRAVENOUS

## 2018-11-29 MED ORDER — PROPOFOL 10 MG/ML IV BOLUS
INTRAVENOUS | Status: AC
  Filled 2018-11-29: qty 20

## 2018-11-29 MED ORDER — LIDOCAINE HCL URETHRAL/MUCOSAL 2 % EX GEL: 1.0000 "application " | Freq: Once | CUTANEOUS | Status: DC | PRN

## 2018-11-29 MED ORDER — FENTANYL CITRATE (PF) 100 MCG/2ML IJ SOLN
INTRAMUSCULAR | Status: DC | PRN
  Administered 2018-11-29 (×2): 50 ug via INTRAVENOUS

## 2018-11-29 MED ORDER — SODIUM CHLORIDE 0.9 % IV SOLN
INTRAVENOUS | Status: DC | PRN
  Administered 2018-11-29: 18:00:00 via INTRAVENOUS

## 2018-11-29 MED ORDER — PROPOFOL 10 MG/ML IV BOLUS
INTRAVENOUS | Status: DC | PRN
  Administered 2018-11-29: 150 mg via INTRAVENOUS

## 2018-11-29 MED ORDER — FENTANYL CITRATE (PF) 100 MCG/2ML IJ SOLN: 25.0000 ug | INTRAMUSCULAR | Status: DC | PRN

## 2018-11-29 MED ORDER — LIDOCAINE HCL (CARDIAC) PF 50 MG/5ML IV SOSY
PREFILLED_SYRINGE | INTRAVENOUS | Status: DC | PRN
  Administered 2018-11-29: 100 mg via INTRAVENOUS

## 2018-11-29 MED ORDER — INSULIN ASPART 100 UNIT/ML ~~LOC~~ SOLN
0.0000 [IU] | Freq: Three times a day (TID) | SUBCUTANEOUS | Status: DC
  Administered 2018-11-30: 3 [IU] via SUBCUTANEOUS
  Administered 2018-11-30: 2 [IU] via SUBCUTANEOUS

## 2018-11-29 MED ORDER — HYDROMORPHONE HCL 1 MG/ML IJ SOLN
0.5000 mg | Freq: Once | INTRAMUSCULAR | Status: AC
  Administered 2018-11-29: 14:00:00 0.5 mg via INTRAVENOUS
  Filled 2018-11-29: qty 1

## 2018-11-29 MED ORDER — FENTANYL CITRATE (PF) 250 MCG/5ML IJ SOLN
INTRAMUSCULAR | Status: AC
  Filled 2018-11-29: qty 5

## 2018-11-29 MED ORDER — SODIUM CHLORIDE 0.9 % IR SOLN
Status: DC | PRN
  Administered 2018-11-29: 1000 mL

## 2018-11-29 MED ORDER — LIDOCAINE HCL URETHRAL/MUCOSAL 2 % EX GEL
CUTANEOUS | Status: DC | PRN
  Administered 2018-11-29: 1 via URETHRAL

## 2018-11-29 MED ORDER — LIDOCAINE HCL URETHRAL/MUCOSAL 2 % EX GEL
1.0000 "application " | Freq: Once | CUTANEOUS | Status: AC | PRN
  Administered 2018-11-29: 1 via URETHRAL
  Filled 2018-11-29 (×2): qty 5

## 2018-11-29 MED ORDER — ONDANSETRON HCL 4 MG/2ML IJ SOLN
INTRAMUSCULAR | Status: AC
  Filled 2018-11-29: qty 2

## 2018-11-29 MED ORDER — CEFAZOLIN SODIUM-DEXTROSE 2-4 GM/100ML-% IV SOLN
2.0000 g | Freq: Once | INTRAVENOUS | Status: AC
  Administered 2018-11-29: 2 g via INTRAVENOUS

## 2018-11-29 MED ORDER — MEPERIDINE HCL 50 MG/ML IJ SOLN: 6.2500 mg | INTRAMUSCULAR | Status: DC | PRN

## 2018-11-29 MED ORDER — LIDOCAINE HCL URETHRAL/MUCOSAL 2 % EX GEL
CUTANEOUS | Status: AC
  Filled 2018-11-29: qty 5

## 2018-11-29 MED ORDER — CEFAZOLIN SODIUM-DEXTROSE 2-4 GM/100ML-% IV SOLN
2.0000 g | Freq: Once | INTRAVENOUS | Status: AC
  Administered 2018-11-29: 18:00:00 2 g via INTRAVENOUS

## 2018-11-29 MED ORDER — BELLADONNA ALKALOIDS-OPIUM 16.2-30 MG RE SUPP
RECTAL | Status: AC
  Filled 2018-11-29: qty 1

## 2018-11-29 MED ORDER — ACETAMINOPHEN 325 MG PO TABS
650.0000 mg | ORAL_TABLET | Freq: Four times a day (QID) | ORAL | Status: DC | PRN
  Administered 2018-11-29 – 2018-12-01 (×5): 650 mg via ORAL
  Filled 2018-11-29 (×5): qty 2

## 2018-11-29 MED ORDER — ONDANSETRON HCL 4 MG/2ML IJ SOLN
INTRAMUSCULAR | Status: DC | PRN
  Administered 2018-11-29: 4 mg via INTRAVENOUS

## 2018-11-29 MED ORDER — IOHEXOL 300 MG/ML  SOLN
INTRAMUSCULAR | Status: DC | PRN
  Administered 2018-11-29: 10 mL

## 2018-11-29 MED ORDER — ACETAMINOPHEN 650 MG RE SUPP
650.0000 mg | Freq: Four times a day (QID) | RECTAL | Status: DC | PRN
  Filled 2018-11-29: qty 1

## 2018-11-29 MED ORDER — LIDOCAINE 2% (20 MG/ML) 5 ML SYRINGE
INTRAMUSCULAR | Status: AC
  Filled 2018-11-29: qty 5

## 2018-11-29 MED ORDER — NEBIVOLOL HCL 10 MG PO TABS
10.0000 mg | ORAL_TABLET | Freq: Every day | ORAL | Status: DC
  Administered 2018-11-30 – 2018-12-02 (×3): 10 mg via ORAL
  Filled 2018-11-29 (×3): qty 1

## 2018-11-29 MED ORDER — CEFAZOLIN SODIUM-DEXTROSE 2-4 GM/100ML-% IV SOLN
INTRAVENOUS | Status: AC
  Filled 2018-11-29: qty 100

## 2018-11-29 MED ORDER — MIDAZOLAM HCL 2 MG/2ML IJ SOLN
INTRAMUSCULAR | Status: AC
  Filled 2018-11-29: qty 2

## 2018-11-29 MED ORDER — KETOROLAC TROMETHAMINE 30 MG/ML IJ SOLN
15.0000 mg | Freq: Once | INTRAMUSCULAR | Status: AC
  Administered 2018-11-29: 15 mg via INTRAVENOUS
  Filled 2018-11-29: qty 1

## 2018-11-29 MED ORDER — SODIUM CHLORIDE 0.9 % IR SOLN
Status: DC | PRN
  Administered 2018-11-29: 3000 mL

## 2018-11-29 SURGICAL SUPPLY — 22 items
BAG URO CATCHER STRL LF (MISCELLANEOUS) ×3 IMPLANT
BASKET ZERO TIP NITINOL 2.4FR (BASKET) IMPLANT
BSKT STON RTRVL ZERO TP 2.4FR (BASKET)
CATH URET 5FR 28IN OPEN ENDED (CATHETERS) ×3 IMPLANT
CLOTH BEACON ORANGE TIMEOUT ST (SAFETY) ×3 IMPLANT
COVER WAND RF STERILE (DRAPES) IMPLANT
EXTRACTOR STONE 1.7FRX115CM (UROLOGICAL SUPPLIES) IMPLANT
EXTRACTOR STONE NITINOL NGAGE (UROLOGICAL SUPPLIES) ×3 IMPLANT
GLOVE BIOGEL M STRL SZ7.5 (GLOVE) ×3 IMPLANT
GOWN STRL REUS W/TWL XL LVL3 (GOWN DISPOSABLE) ×3 IMPLANT
GUIDEWIRE ANG ZIPWIRE 038X150 (WIRE) IMPLANT
GUIDEWIRE STR DUAL SENSOR (WIRE) ×6 IMPLANT
KIT TURNOVER KIT A (KITS) IMPLANT
MANIFOLD NEPTUNE II (INSTRUMENTS) ×3 IMPLANT
PACK CYSTO (CUSTOM PROCEDURE TRAY) ×3 IMPLANT
SHEATH URETERAL 12FRX28CM (UROLOGICAL SUPPLIES) IMPLANT
SHEATH URETERAL 12FRX35CM (MISCELLANEOUS) IMPLANT
STENT URET 6FRX26 CONTOUR (STENTS) ×3 IMPLANT
TRAY FOLEY MTR SLVR 16FR STAT (SET/KITS/TRAYS/PACK) ×3 IMPLANT
TUBING CONNECTING 10 (TUBING) ×3 IMPLANT
TUBING UROLOGY SET (TUBING) ×3 IMPLANT
WIRE COONS/BENSON .038X145CM (WIRE) IMPLANT

## 2018-11-29 NOTE — Op Note (Addendum)
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 Cystoscopy 2. Left ureteroscopy and stone removal 3. Ureteroscopic laser lithotripsy 4. Left ureteral stent placement (6x26cm) without dangler  5. Left retrograde pyelography with interpretation  Surgeon: Louis Meckel M.D.  Anesthesia: General  Complications: None  Intraoperative findings:  - Cystourethroscopy show fixed bladder neck and pale prostatic urethra consistent with history of radiation  - Left retrograde pyelography demonstrated left hydronephrosis -  Dense mid ureteral stone treated with laser lithotripsy. Fragmented x3 and all fragments removed  EBL: Minimal  Specimens: 1. Left ureteral calculus   Disposition of specimens: [facility] for stone analysis   Indication: [name] is a 78 y.o. 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 Presented with flank pain and AKI due to obstruction.    After reviewing the management options for treatment, the patient elected to proceed with the above surgical procedure(s). We have discussed the potential benefits and risks of the procedure, side effects of the proposed treatment, the likelihood of the patient achieving the goals of the procedure, and any potential problems that might occur during the procedure or recuperation. Informed consent has been obtained.  Description of procedure:  The patient was taken to the operating room and general anesthesia was induced.  The patient was placed in the dorsal lithotomy position, prepped and draped in the usual sterile fashion, and preoperative antibiotics were administered. A preoperative time-out was performed.   Cystourethroscopy was performed.  The patient's urethra was examined found to have fixed bladder neck and pale prostatic urethra consistent with history of radiation. No evidence of  urethral stricture.  The bladder was then systematically examined in its entirety. There was no evidence for any bladder tumors, stones, or other mucosal pathology.    Attention then turned to the left ureteral orifice and a ureteral catheter was used to intubate the ureteral orifice.  A 0.38 sensor guidewire was then advanced up the left ureter into the renal pelvis under fluoroscopic guidance. Omnipaque contrast was injected through the ureteral catheter and a retrograde pyelogram was performed with findings as dictated above.  The 4.5Fr semirigid ureteroscope was then advanced into the ureter next to the guidewire and the calculus was identified.   The stone was then fragmented with the 365 micron holmium laser fiber on a setting of 1J and frequency of 10 Hz.   All stones were then removed from the ureter with a zero tip nitinol basket.  Reinspection of the ureter revealed no remaining visible stones or fragments.   The wire was then backloaded through the cystoscope and a ureteral stent was advance over the wire using Seldinger technique.  The stent was positioned appropriately under fluoroscopic and cystoscopic guidance.  The wire was then removed with an adequate stent curl noted in the renal pelvis as well as in the bladder.  The bladder was then emptied and the procedure ended.  The patient appeared to tolerate the procedure well and without complications.  The patient was able to be awakened and transferred to the recovery unit in satisfactory condition.   Plan: - Admit to hospitalist for observation (due to CAD, solitary kidney, AKI) - Watch for post obstructive diuresis and monitor AKI - Follow up with Urology in 1- 2 weeks for stent removal. We will call him with this appointment date and time  - Stone taken to office for analysis - Stent discomfort medications: Flomax  0.4mg  QHS, Ditropan 5mg  TID PRN, Pyridium 100mg  Q8 PRN - Ok for oxycodone PRN for post op pain. Would send home with  10 pain pills

## 2018-11-29 NOTE — ED Provider Notes (Signed)
[facility] DEPT Provider Note   CSN: [id number] Arrival date & time: 11/29/18  1215     History   Chief Complaint Chief Complaint  Patient presents with  . Urinary Retention    HPI EMIR NACK is a 78 y.o. male.     Patient with left flank pain.  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  Patient states the pain started 3 AM and he has not urinated since then  The history is provided by the patient. No language interpreter was used.  Flank Pain This is a new problem. The problem occurs constantly. The problem has not changed since onset.Pertinent negatives include no chest pain, no abdominal pain and no headaches. Nothing aggravates the symptoms. Nothing relieves the symptoms. He has tried nothing for the symptoms. The treatment provided no relief.    Past Medical History:  Diagnosis Date  . Arthritis    "little in my right hand; some in my right big toe" (10/22/2016)  . Borderline diabetes    "tx'd w/RX; blood surgars dropped too low; dr. dc'd it 09/25/2016; blood sugars fine since then" (10/22/2016)  . Chronic kidney disease    S/P right nephrectomy in 1984  . History of gout 04/2016; 09/2016   RLE; RLE  . History of kidney stones   . Hyperlipidemia   . Hypertension   . MGUS (monoclonal gammopathy of unknown significance)   . Migraine    "none in years" (10/22/2016)  . Myeloma ([place])    "dormant for the last 2-3 years" (10/22/2016)  . NSTEMI (non-ST elevated myocardial infarction) ([place]) 09/25/2016  . 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 Prostate cancer ([place]) 2006   s/p brachytherapy  . Seborrheic keratosis   . 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 Post PTCA 10/22/2016  . CAD (coronary artery disease), native coronary artery 10/20/2016  . NSTEMI (non-ST elevated  myocardial infarction) ([place]) 09/25/2016  . MGUS (monoclonal gammopathy of unknown significance)   . Hypertension   . Chronic kidney disease   . Hyperlipidemia   . Borderline diabetes   . Seborrheic keratosis     Past Surgical History:  Procedure Laterality Date  . CARDIAC CATHETERIZATION  1984  . CORONARY ANGIOPLASTY WITH STENT PLACEMENT  09/26/2016; 10/22/2016   "4 stents; 2 stents"  . CORONARY STENT INTERVENTION Right 09/26/2016   Procedure: Coronary Stent Intervention;  Surgeon: Adrian Prows, MD;  Location: [facility];  Service: Cardiovascular;  Laterality: Right;  . CORONARY STENT INTERVENTION N/A 10/22/2016   Procedure: Coronary Stent Intervention;  Surgeon: Adrian Prows, MD;  Location: [facility];  Service: Cardiovascular;  Laterality: N/A;  . INSERTION PROSTATE RADIATION SEED  2009?  Marland Kitchen LEFT HEART CATH AND CORONARY ANGIOGRAPHY N/A 09/26/2016   Procedure: Left Heart Cath and Coronary Angiography;  Surgeon: Adrian Prows, MD;  Location: [facility];  Service: Cardiovascular;  Laterality: N/A;  . NEPHRECTOMY Right 1984   [place]  . REPAIR KNEE LIGAMENT  04/2016   "tore all the ligaments; had to put 3 pins in to reattach; Dr. Gladstone Lighter"  . TONSILLECTOMY          Home Medications    Prior to Admission medications   Medication Sig Start Date End Date Taking? Authorizing Provider  acetaminophen (TYLENOL) 500 MG tablet Take 1,000 mg by mouth every 6 (six) hours as needed for moderate pain.    [provider]  amLODipine (NORVASC) 5 MG tablet Take 1 tablet (5 mg total) by mouth daily. 11/16/18   Miquel Dunn, NP  aspirin EC 81 MG tablet Take 81 mg by mouth daily.    [provider]  BYSTOLIC 20 MG TABS Take 10 mg by mouth daily. daily    [provider]  glyBURIDE (DIABETA) 2.5 MG tablet Take 1 tablet (2.5 mg total) by mouth daily with breakfast. 09/28/16   Adrian Prows, MD  isosorbide mononitrate (IMDUR) 60 MG 24 hr tablet Take 1 tablet (60  mg total) by mouth daily. 11/16/18   Miquel Dunn, NP  nitroGLYCERIN (NITROSTAT) 0.4 MG SL tablet Place 1 tablet (0.4 mg total) under the tongue every 5 (five) minutes x 3 doses as needed for chest pain. 07/26/18   Adrian Prows, MD  olmesartan-hydrochlorothiazide (BENICAR HCT) 20-12.5 MG tablet Take 0.5 tablets by mouth daily. 11/06/18   Adrian Prows, MD  rosuvastatin (CRESTOR) 10 MG tablet Take 1 tablet (10 mg total) by mouth daily. 11/17/18 02/15/19  Adrian Prows, MD  Tetrahydrozoline HCl (VISINE OP) Place 1 drop into both eyes daily as needed (dry eyes).    [provider]    Family History Family History  Problem Relation Age of Onset  . Diabetes Mother   . Heart attack Father     Social History Social History   Tobacco Use  . Smoking status: Never Smoker  . Smokeless tobacco: Never Used  Substance Use Topics  . Alcohol use: Yes    Comment: very little   . Drug use: No     Allergies   Sulfa antibiotics and Lisinopril   Review of Systems Review of Systems  Constitutional: Negative for appetite change and fatigue.  HENT: Negative for congestion, ear discharge and sinus pressure.   Eyes: Negative for discharge.  Respiratory: Negative for cough.   Cardiovascular: Negative for chest pain.  Gastrointestinal: Negative for abdominal pain and diarrhea.  Genitourinary: Positive for flank pain. Negative for frequency and hematuria.  Musculoskeletal: Negative for back pain.  Skin: Negative for rash.  Neurological: Negative for seizures and headaches.  Psychiatric/Behavioral: Negative for hallucinations.     Physical Exam Updated Vital Signs BP (!) 145/72   Pulse 61   Temp 98.2 F (36.8 C) (Oral)   Resp (!) 22   Ht 6' (1.829 m)   Wt 88 kg   SpO2 100%   BMI 26.31 kg/m   Physical Exam Vitals signs and nursing note reviewed.  Constitutional:      Appearance: He is well-developed.  HENT:     Head: Normocephalic.     Nose: Nose normal.  Eyes:     General: No  scleral icterus.    Conjunctiva/sclera: Conjunctivae normal.  Neck:     Musculoskeletal: Neck supple.     Thyroid: No thyromegaly.  Cardiovascular:     Rate and Rhythm: Normal rate and regular rhythm.     Heart sounds: No murmur. No friction rub. No gallop.   Pulmonary:     Breath sounds: No stridor. No wheezing or rales.  Chest:     Chest wall: No tenderness.  Abdominal:     General: There is no distension.     Tenderness: There is no abdominal tenderness. There is no rebound.  Musculoskeletal: Normal range of motion.  Lymphadenopathy:  Cervical: No cervical adenopathy.  Skin:    Findings: No erythema or rash.  Neurological:     Mental Status: He is oriented to person, place, and time.     Motor: No abnormal muscle tone.     Coordination: Coordination normal.  Psychiatric:        Behavior: Behavior normal.      ED Treatments / Results  Labs (all labs ordered are listed, but only abnormal results are displayed) Labs Reviewed  COMPREHENSIVE METABOLIC PANEL - Abnormal; Notable for the following components:      Result Value   CO2 21 (*)    Glucose, Bld 248 (*)    BUN 60 (*)    Creatinine, Ser 5.92 (*)    GFR calc non Af Amer 8 (*)    GFR calc Af Amer 10 (*)    All other components within normal limits  SARS CORONAVIRUS 2 (HOSPITAL ORDER, [facility])  CBC WITH DIFFERENTIAL/PLATELET  URINALYSIS, ROUTINE W REFLEX MICROSCOPIC    EKG None  Radiology Ct Renal Stone Study  Result Date: 11/29/2018 CLINICAL DATA:  Blank pain and abdominal pain since yesterday. Hematuria. History of prostate cancer and right nephrectomy. History of nephrolithiasis greater than 40 years ago. EXAM: CT ABDOMEN AND PELVIS WITHOUT CONTRAST TECHNIQUE: Multidetector CT imaging of the abdomen and pelvis was performed following the standard protocol without IV contrast. COMPARISON:  PET-CT dated 02/19/2018. FINDINGS: Lower chest: Right coronary artery stent. Mildly  enlarged heart. Clear lung bases. Hepatobiliary: No focal liver abnormality is seen. No gallstones, gallbladder wall thickening, or biliary dilatation. Pancreas: Unremarkable. No pancreatic ductal dilatation or surrounding inflammatory changes. Spleen: Normal in size without focal abnormality. Adrenals/Urinary Tract: Stable post nephrectomy changes on the right. Normal appearing adrenal glands. Interval mild dilatation of the left renal collecting system and ureter to the level of a 5 mm calculus in the distal ureter, just proximal to the ureterovesical junction. Mild left perinephric and periureteric soft tissue stranding. This represents the previously demonstrated lower pole left renal calculus, no longer seen in the kidney. No significant urine in the bladder. No bladder calculi seen Stomach/Bowel: Multiple colonic diverticula without evidence of diverticulitis. Unremarkable stomach, small bowel and appendix. Vascular/Lymphatic: Atheromatous arterial calcifications without aneurysm. No enlarged lymph nodes. Reproductive: Prostate radiation seed implants. Other: Small bilateral inguinal hernias containing fat. Small umbilical hernia containing fat. Musculoskeletal: Lumbar and lower thoracic spine degenerative changes. No evidence of bony metastatic disease. IMPRESSION: 1. 5 mm distal left ureteral calculus causing mild left hydronephrosis and hydroureter. 2. Colonic diverticulosis. Electronically Signed   By: Claudie Revering M.D.   On: 11/29/2018 14:34    Procedures Procedures (including critical care time)  Medications Ordered in ED Medications  lidocaine (XYLOCAINE) 2 % jelly 1 application (0 application Urethral Hold 11/29/18 1344)  HYDROmorphone (DILAUDID) injection 0.5 mg (0.5 mg Intravenous Given 11/29/18 1343)  ondansetron (ZOFRAN) injection 4 mg (4 mg Intravenous Given 11/29/18 1343)  ketorolac (TORADOL) 30 MG/ML injection 15 mg (15 mg Intravenous Given 11/29/18 1343)     Initial Impression /  Assessment and Plan / ED Course  I have reviewed the triage vital signs and the nursing notes.  Pertinent labs & imaging results that were available during my care of the patient were reviewed by me and considered in my medical decision making (see chart for details). CRITICAL CARE Performed by: Milton Ferguson Total critical care time:32minutes Critical care time was exclusive of separately billable procedures and treating other patients.  Critical care was necessary to treat or prevent imminent or life-threatening deterioration. Critical care was time spent personally by me on the following activities: development of treatment plan with patient and/or surrogate as well as nursing, discussions with consultants, evaluation of patient's response to treatment, examination of patient, obtaining history from patient or surrogate, ordering and performing treatments and interventions, ordering and review of laboratory studies, ordering and review of radiographic studies, pulse oximetry and re-evaluation of patient's condition.        Patient with kidney failure and a kidney stone obstructing his left kidney.  He will be seen by urology Final Clinical Impressions(s) / ED Diagnoses   Final diagnoses:  Kidney stone    ED Discharge Orders    None       Milton Ferguson, MD 11/29/18 1514

## 2018-11-29 NOTE — Discharge Instructions (Signed)
DISCHARGE INSTRUCTIONS FOR KIDNEY STONE/URETERAL STENT   MEDICATIONS:  1.  Resume all your other meds from home - except do not take any extra narcotic pain meds that you may have at home.  2. Pyridium is to help with the burning/stinging when you urinate. 3. Tramadol is for moderate/severe pain, otherwise taking upto 1000 mg every 6 hours of plainTylenol will help treat your pain.     ACTIVITY:  1. No strenuous activity x 1week  2. No driving while on narcotic pain medications  3. Drink plenty of water  4. Continue to walk at home - you can still get blood clots when you are at home, so keep active, but don't over do it.  5. May return to work/school tomorrow or when you feel ready   BATHING:  1. You can shower and we recommend daily showers  2. You have a string coming from your urethra: The stent string is attached to your ureteral stent. Do not pull on this.   SIGNS/SYMPTOMS TO CALL:  Please call us if you have a fever greater than 101.5, uncontrolled nausea/vomiting, uncontrolled pain, dizziness, unable to urinate, bloody urine, chest pain, shortness of breath, leg swelling, leg pain, redness around wound, drainage from wound, or any other concerns or questions.   You can reach us at [phone].   FOLLOW-UP:  1. You have an appointment for stent removal in 1 week.  

## 2018-11-29 NOTE — Transfer of Care (Signed)
Immediate Anesthesia Transfer of Care Note  Patient: [name]  Procedure(s) Performed: CYSTOSCOPY/URETEROSCOPY/HOLMIUM LASER/STENT PLACEMENTleft retrograde pylegram (Left Ureter) HOLMIUM LASER APPLICATION (Ureter)  Patient Location: PACU  Anesthesia Type:General  Level of Consciousness: awake and alert   Airway & Oxygen Therapy: Patient Spontanous Breathing and Patient connected to face mask oxygen  Post-op Assessment: Report given to RN and Post -op Vital signs reviewed and stable  Post vital signs: Reviewed and stable  Last Vitals:  Vitals Value Taken Time  BP 150/68 11/29/18 1906  Temp    Pulse 49 11/29/18 1906  Resp 15 11/29/18 1906  SpO2 100 % 11/29/18 1906  Vitals shown include unvalidated device data.  Last Pain:  Vitals:   11/29/18 1458  TempSrc:   PainSc: 7          Complications: No apparent anesthesia complications

## 2018-11-29 NOTE — H&P (Signed)
History and Physical    [name] RJJ:[id number] DOB: February 17, 1941 DOA: 11/29/2018  PCP: Burnard Bunting, MD  Patient coming from: Home  Chief Complaint: L Flank pain  HPI: [name] is a 78 y.o. male with medical history significant of DM2, HTN, HLD, RLE, solitary kidney who presented to the ED with complaints of L flank pain that started on the early AM of hospital admit. Pt also reported no significant urine output since onset. 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 Pt also has significant CAD hx and is s/p multiple stents, most recent was over 1 year prior to admit. Pt is normally followed by Dr. Einar Gip. Currently denies fevers, chills, sweats. Denies chest pain or sob.  ED Course: In the ED, patient was noted to have Cr of 5.92 (baseline around 1.6-1.7). No leukocytosis.Hgb of 14.5. Pt underwent renal stone CT with findings of 46mm distal L ureteral calculus resulting in mild L hydronephrosis and hydroureter. Urology was consulted and pt was planned for cystourethroscopy. Hospitalist consulted for consideration for medical admission.  Review of Systems:  Review of Systems  Constitutional: Negative for chills, fever and weight loss.  HENT: Negative for congestion, ear discharge, ear pain, sinus pain and tinnitus.   Eyes: Negative for double vision, photophobia and pain.  Respiratory: Negative for cough, hemoptysis and shortness of breath.   Cardiovascular: Negative for chest pain and palpitations.  Gastrointestinal: Negative for heartburn, nausea and vomiting.  Genitourinary: Positive for flank pain. Negative for urgency.  Musculoskeletal: Negative for back pain, falls and neck pain.  Neurological: Negative for tingling, tremors, seizures, loss of consciousness and headaches.  Psychiatric/Behavioral: Negative for hallucinations, memory loss and substance abuse. The patient does not have insomnia.     Past Medical History:  Diagnosis Date   . Arthritis    "little in my right hand; some in my right big toe" (10/22/2016)  . Borderline diabetes    "tx'd w/RX; blood surgars dropped too low; dr. dc'd it 09/25/2016; blood sugars fine since then" (10/22/2016)  . Chronic kidney disease    S/P right nephrectomy in 1984  . History of gout 04/2016; 09/2016   RLE; RLE  . History of kidney stones   . Hyperlipidemia   . Hypertension   . MGUS (monoclonal gammopathy of unknown significance)   . Migraine    "none in years" (10/22/2016)  . Myeloma ([place])    "dormant for the last 2-3 years" (10/22/2016)  . NSTEMI (non-ST elevated myocardial infarction) ([place]) 09/25/2016  . 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 Prostate cancer ([place]) 2006   s/p brachytherapy  . Seborrheic keratosis   . 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   history of nephrectomy in 1984    Past Surgical History:  Procedure Laterality Date  . CARDIAC CATHETERIZATION  1984  . CORONARY ANGIOPLASTY WITH STENT PLACEMENT  09/26/2016; 10/22/2016   "4 stents; 2 stents"  . CORONARY STENT INTERVENTION Right 09/26/2016   Procedure: Coronary Stent Intervention;  Surgeon: Adrian Prows, MD;  Location: [facility];  Service: Cardiovascular;  Laterality: Right;  . CORONARY STENT INTERVENTION N/A 10/22/2016   Procedure: Coronary Stent Intervention;  Surgeon: Adrian Prows, MD;  Location: [facility];  Service: Cardiovascular;  Laterality: N/A;  . INSERTION PROSTATE RADIATION SEED  2009?  Marland Kitchen LEFT HEART CATH AND CORONARY ANGIOGRAPHY N/A 09/26/2016   Procedure: Left Heart Cath and Coronary Angiography;  Surgeon: Adrian Prows,  MD;  Location: [facility];  Service: Cardiovascular;  Laterality: N/A;  . NEPHRECTOMY Right 1984   [place]  . REPAIR KNEE LIGAMENT  04/2016   "tore all the ligaments; had to put 3 pins in to reattach; Dr. Gladstone Lighter"  . TONSILLECTOMY       reports that he has never smoked. He has never used smokeless tobacco. He reports current alcohol use. He reports  that he does not use drugs.  Allergies  Allergen Reactions  . Sulfa Antibiotics Hives  . Morphine And Related Other (See Comments)    Spontaneous body movements  . Lisinopril Cough    Family History  Problem Relation Age of Onset  . Diabetes Mother   . Heart attack Father     Prior to Admission medications   Medication Sig Start Date End Date Taking? Authorizing Provider  acetaminophen (TYLENOL) 500 MG tablet Take 1,000 mg by mouth every 6 (six) hours as needed for moderate pain.   Yes [provider]  aspirin EC 81 MG tablet Take 81 mg by mouth daily.   Yes [provider]  atorvastatin (LIPITOR) 80 MG tablet Take 80 mg by mouth every evening.   Yes [provider]  BYSTOLIC 20 MG TABS Take 10 mg by mouth daily. daily   Yes [provider]  glimepiride (AMARYL) 2 MG tablet Take 2 mg by mouth daily with breakfast.   Yes [provider]  isosorbide mononitrate (IMDUR) 60 MG 24 hr tablet Take 1 tablet (60 mg total) by mouth daily. 11/16/18  Yes Miquel Dunn, NP  latanoprost (XALATAN) 0.005 % ophthalmic solution Place 1 drop into both eyes at bedtime.   Yes [provider]  lisinopril-hydrochlorothiazide (ZESTORETIC) 20-12.5 MG tablet Take 1 tablet by mouth daily.   Yes [provider]  Tetrahydrozoline HCl (VISINE OP) Place 1 drop into both eyes daily as needed (dry eyes).   Yes [provider]  amLODipine (NORVASC) 5 MG tablet Take 1 tablet (5 mg total) by mouth daily. Patient not taking: Reported on 11/29/2018 11/16/18   Miquel Dunn, NP  glyBURIDE (DIABETA) 2.5 MG tablet Take 1 tablet (2.5 mg total) by mouth daily with breakfast. Patient not taking: Reported on 11/29/2018 09/28/16   Adrian Prows, MD  nitroGLYCERIN (NITROSTAT) 0.4 MG SL tablet Place 1 tablet (0.4 mg total) under the tongue every 5 (five) minutes x 3 doses as needed for chest pain. 07/26/18   Adrian Prows, MD  olmesartan-hydrochlorothiazide  (BENICAR HCT) 20-12.5 MG tablet Take 0.5 tablets by mouth daily. Patient not taking: Reported on 11/29/2018 11/06/18   Adrian Prows, MD  rosuvastatin (CRESTOR) 10 MG tablet Take 1 tablet (10 mg total) by mouth daily. Patient not taking: Reported on 11/29/2018 11/17/18 02/15/19  Adrian Prows, MD    Physical Exam: Vitals:   11/29/18 1227 11/29/18 1244 11/29/18 1330  BP: (!) 146/72  (!) 145/72  Pulse: 74  61  Resp: 20  (!) 22  Temp: 98.2 F (36.8 C)    TempSrc: Oral    SpO2: 98%  100%  Weight:  88 kg   Height:  6' (1.829 m)     Constitutional: NAD, calm, comfortable Vitals:   11/29/18 1227 11/29/18 1244 11/29/18 1330  BP: (!) 146/72  (!) 145/72  Pulse: 74  61  Resp: 20  (!) 22  Temp: 98.2 F (36.8 C)    TempSrc: Oral    SpO2: 98%  100%  Weight:  88 kg  Height:  6' (1.829 m)    Eyes: PERRL, lids and conjunctivae normal ENMT: Face mask in place  Neck: normal, supple, no masses, no thyromegaly Respiratory: clear to auscultation bilaterally, no wheezing, no crackles. Normal respiratory effort. No accessory muscle use.  Cardiovascular: Regular rate and rhythm,No extremity edema. Abdomen: no tenderness, no masses palpated. No hepatosplenomegaly. Bowel sounds positive.  Musculoskeletal: no clubbing / cyanosis. No joint deformity upper and lower extremities. Good ROM, no contractures. Normal muscle tone.  Skin: no rashes, lesions, . No induration Neurologic: CN 2-12 grossly intact. Sensation intact, DTR normal. Strength 5/5 in all 4.  Psychiatric: Normal judgment and insight. Alert and oriented x 3. Normal mood.    Labs on Admission: I have personally reviewed following labs and imaging studies  CBC: Recent Labs  Lab 11/29/18 1255  WBC 9.2  NEUTROABS 6.0  HGB 14.5  HCT 44.1  MCV 91.9  PLT 492   Basic Metabolic Panel: Recent Labs  Lab 11/29/18 1255  NA 140  K 4.6  CL 105  CO2 21*  GLUCOSE 248*  BUN 60*  CREATININE 5.92*  CALCIUM 8.9   GFR: Estimated Creatinine  Clearance: 11.1 mL/min (A) (by C-G formula based on SCr of 5.92 mg/dL (H)). Liver Function Tests: Recent Labs  Lab 11/29/18 1255  AST 22  ALT 23  ALKPHOS 85  BILITOT 0.6  PROT 6.5  ALBUMIN 4.0   No results for input(s): LIPASE, AMYLASE in the last 168 hours. No results for input(s): AMMONIA in the last 168 hours. Coagulation Profile: No results for input(s): INR, PROTIME in the last 168 hours. Cardiac Enzymes: No results for input(s): CKTOTAL, CKMB, CKMBINDEX, TROPONINI in the last 168 hours. BNP (last 3 results) No results for input(s): PROBNP in the last 8760 hours. HbA1C: No results for input(s): HGBA1C in the last 72 hours. CBG: No results for input(s): GLUCAP in the last 168 hours. Lipid Profile: No results for input(s): CHOL, HDL, LDLCALC, TRIG, CHOLHDL, LDLDIRECT in the last 72 hours. Thyroid Function Tests: No results for input(s): TSH, T4TOTAL, FREET4, T3FREE, THYROIDAB in the last 72 hours. Anemia Panel: No results for input(s): VITAMINB12, FOLATE, FERRITIN, TIBC, IRON, RETICCTPCT in the last 72 hours. Urine analysis: No results found for: COLORURINE, APPEARANCEUR, LABSPEC, PHURINE, GLUCOSEU, HGBUR, BILIRUBINUR, KETONESUR, PROTEINUR, UROBILINOGEN, NITRITE, LEUKOCYTESUR Sepsis Labs: !!!!!!!!!!!!!!!!!!!!!!!!!!!!!!!!!!!!!!!!!!!! @LABRCNTIP (procalcitonin:4,lacticidven:4) ) Recent Results (from the past 240 hour(s))  SARS Coronavirus 2 (CEPHEID - Performed in [place] hospital lab), Hosp Order     Status: None   Collection Time: 11/29/18  3:21 PM   Specimen: Nasopharyngeal Swab  Result Value Ref Range Status   SARS Coronavirus 2 NEGATIVE NEGATIVE Final    Comment: (NOTE) If result is NEGATIVE SARS-CoV-2 target nucleic acids are NOT DETECTED. The SARS-CoV-2 RNA is generally detectable in upper and lower  respiratory specimens during the acute phase of infection. The lowest  concentration of SARS-CoV-2 viral copies this assay can detect is 250  copies / mL. A  negative result does not preclude SARS-CoV-2 infection  and should not be used as the sole basis for treatment or other  patient management decisions.  A negative result may occur with  improper specimen collection / handling, submission of specimen other  than nasopharyngeal swab, presence of viral mutation(s) within the  areas targeted by this assay, and inadequate number of viral copies  (<250 copies / mL). A negative result must be combined with clinical  observations, patient history, and epidemiological information. If result is POSITIVE SARS-CoV-2 target  nucleic acids are DETECTED. The SARS-CoV-2 RNA is generally detectable in upper and lower  respiratory specimens dur ing the acute phase of infection.  Positive  results are indicative of active infection with SARS-CoV-2.  Clinical  correlation with patient history and other diagnostic information is  necessary to determine patient infection status.  Positive results do  not rule out bacterial infection or co-infection with other viruses. If result is PRESUMPTIVE POSTIVE SARS-CoV-2 nucleic acids MAY BE PRESENT.   A presumptive positive result was obtained on the submitted specimen  and confirmed on repeat testing.  While 2019 novel coronavirus  (SARS-CoV-2) nucleic acids may be present in the submitted sample  additional confirmatory testing may be necessary for epidemiological  and / or clinical management purposes  to differentiate between  SARS-CoV-2 and other Sarbecovirus currently known to infect humans.  If clinically indicated additional testing with an alternate test  methodology [phone]) is advised. The SARS-CoV-2 RNA is generally  detectable in upper and lower respiratory sp ecimens during the acute  phase of infection. The expected result is Negative. Fact Sheet for Patients:  StrictlyIdeas.no Fact Sheet for Healthcare Providers: BankingDealers.co.za This test is not  yet approved or cleared by the [place] FDA and has been authorized for detection and/or diagnosis of SARS-CoV-2 by FDA under an Emergency Use Authorization (EUA).  This EUA will remain in effect (meaning this test can be used) for the duration of the COVID-19 declaration under Section 564(b)(1) of the Act, 21 U.S.C. section 360bbb-3(b)(1), unless the authorization is terminated or revoked sooner. Performed at [facility], [place] [address], [place], [place] [address]      Radiological Exams on Admission:  CT was personally reviewed by myself Ct Renal Stone Study  Result Date: 11/29/2018 CLINICAL DATA:  Blank pain and abdominal pain since yesterday. Hematuria. History of prostate cancer and right nephrectomy. History of nephrolithiasis greater than 40 years ago. EXAM: CT ABDOMEN AND PELVIS WITHOUT CONTRAST TECHNIQUE: Multidetector CT imaging of the abdomen and pelvis was performed following the standard protocol without IV contrast. COMPARISON:  PET-CT dated 02/19/2018. FINDINGS: Lower chest: Right coronary artery stent. Mildly enlarged heart. Clear lung bases. Hepatobiliary: No focal liver abnormality is seen. No gallstones, gallbladder wall thickening, or biliary dilatation. Pancreas: Unremarkable. No pancreatic ductal dilatation or surrounding inflammatory changes. Spleen: Normal in size without focal abnormality. Adrenals/Urinary Tract: Stable post nephrectomy changes on the right. Normal appearing adrenal glands. Interval mild dilatation of the left renal collecting system and ureter to the level of a 5 mm calculus in the distal ureter, just proximal to the ureterovesical junction. Mild left perinephric and periureteric soft tissue stranding. This represents the previously demonstrated lower pole left renal calculus, no longer seen in the kidney. No significant urine in the bladder. No bladder calculi seen Stomach/Bowel: Multiple colonic diverticula without evidence of  diverticulitis. Unremarkable stomach, small bowel and appendix. Vascular/Lymphatic: Atheromatous arterial calcifications without aneurysm. No enlarged lymph nodes. Reproductive: Prostate radiation seed implants. Other: Small bilateral inguinal hernias containing fat. Small umbilical hernia containing fat. Musculoskeletal: Lumbar and lower thoracic spine degenerative changes. No evidence of bony metastatic disease. IMPRESSION: 1. 5 mm distal left ureteral calculus causing mild left hydronephrosis and hydroureter. 2. Colonic diverticulosis. Electronically Signed   By: Claudie Revering M.D.   On: 11/29/2018 14:34    Assessment/Plan Principal Problem:   ARF (acute renal failure) (HCC) Active Problems:   Hypertension   Chronic kidney disease   Hyperlipidemia   CAD (coronary artery disease),  native coronary artery   Single kidney   Urinary tract obstruction by kidney stone   Type 2 diabetes mellitus with hyperlipidemia ([place])   1. ARF 1. Baseline Cr of 1.6-1.7, presented at 5.92 2. Likely secondary to obstrucive disease from renal calculi in setting of solitary kidney 3. Urology following, for cystourethroscopy today 4. No urine output thus far. Electrolytes unremarkable and pt is on minimal O2 support 5. Will continue on IVF hydration, monitor I/o 6. Follow serial BMP 7. Have consulted Nephrology to follow 8. Avoid nephrotoxic agents 2. HTN 1. BP presently stable at this time 2. Would resume home BP meds with exception of lisinopril-HCTZ given ARF 3. Have added PRN hydralazine 3. HLD 1. Continue lipitor as tolerated per home meds 4. CAD 1. No chest pain at present 2. Pt has hx of multiple stents, last was over 52yr prior to admit 3. Most recent 2d echo from 2018 demonstrated EF 50-55% with akinesis of anterolateral myocardium and hypokinesis of mid-distal anterolateral myocardium 4. Followed by Dr. Einar Gip 5. Hx solitary kidney 1. Secondary to nephrectomy following diagnosis or R renal cell  cancer 2. Stable at this time 6. Obstructing renal calculi 1. Urology following, to undergo cystourethroscopy tonight 2. Will defer further mgt to Urology 7. DM2 with hyperlipidemia 1. Most recent a1c from 2018 of 7.5 2. Will repeat a1c 3. Will continue on sensitive scale SSI 4. Hold oral hyperglycemic while in hospital  DVT prophylaxis: SCD's  Code Status: DNR. Confirmed with patient at bedside Family Communication: Pt in room, family not at bedside  Disposition Plan: Uncertain at this time  Consults called: Urology, Nephrology Admission status: Inpatient, as would likely require greater than 2 midnight stay to work up an manage ARF   Marylu Lund MD [facility] Pager On Amion  If 7PM-7AM, please contact night-coverage  11/29/2018, 6:08 PM

## 2018-11-29 NOTE — Anesthesia Procedure Notes (Signed)
Date/Time: 11/29/2018 7:00 PM Performed by: Cynda Familia, CRNA Oxygen Delivery Method: Simple face mask Placement Confirmation: positive ETCO2 and breath sounds checked- equal and bilateral Dental Injury: Teeth and Oropharynx as per pre-operative assessment

## 2018-11-29 NOTE — ED Triage Notes (Signed)
Patient arrived via POV. Patient is AOx4 and ambulatory. Patient chief complaint is urinary retention with blood in urine as well. Patient noticed small amount of blood yesterday however has not urinated since 0230-0300 this morning and is in a large amount of pain. Patient only has 1 kidney.

## 2018-11-29 NOTE — Anesthesia Preprocedure Evaluation (Addendum)
Anesthesia Evaluation  Patient identified by MRN, date of birth, ID band Patient awake    Reviewed: Allergy & Precautions, NPO status , Patient's Chart, lab work & pertinent test results, reviewed documented beta blocker date and time   Airway Mallampati: II  TM Distance: >3 FB Neck ROM: Full    Dental  (+) Dental Advisory Given   Pulmonary neg pulmonary ROS,    Pulmonary exam normal breath sounds clear to auscultation       Cardiovascular hypertension, Pt. on home beta blockers and Pt. on medications + Past MI and + Cardiac Stents  Normal cardiovascular exam Rhythm:Regular Rate:Normal  Echo 09/2016 - Left ventricle: The cavity size was normal. Systolic function was normal. The estimated ejection fraction was in the range of 50% to 55%. Akinesis of the anterolateral myocardium. Hypokinesis of the mid to distal anterolateral myocardium. Left ventricular  diastolic function parameters were normal.   Neuro/Psych  Headaches, negative psych ROS   GI/Hepatic negative GI ROS, Neg liver ROS,   Endo/Other  diabetes  Renal/GU Renal disease     Musculoskeletal  (+) Arthritis ,   Abdominal   Peds  Hematology negative hematology ROS (+)   Anesthesia Other Findings   Reproductive/Obstetrics                            Anesthesia Physical Anesthesia Plan  ASA: III  Anesthesia Plan: General   Post-op Pain Management:    Induction: Intravenous  PONV Risk Score and Plan: 4 or greater and 3 and Ondansetron, Dexamethasone and Treatment may vary due to age or medical condition  Airway Management Planned: LMA  Additional Equipment: None  Intra-op Plan:   Post-operative Plan: Extubation in OR  Informed Consent: I have reviewed the patients History and Physical, chart, labs and discussed the procedure including the risks, benefits and alternatives for the proposed anesthesia with the patient or  authorized representative who has indicated his/her understanding and acceptance.     Dental advisory given  Plan Discussed with: CRNA  Anesthesia Plan Comments:        Anesthesia Quick Evaluation

## 2018-11-29 NOTE — Anesthesia Procedure Notes (Signed)
Procedure Name: LMA Insertion Date/Time: 11/29/2018 6:11 PM Performed by: Lissa Morales, CRNA Pre-anesthesia Checklist: Patient identified, Emergency Drugs available, Suction available and Patient being monitored Patient Re-evaluated:Patient Re-evaluated prior to induction Oxygen Delivery Method: Circle system utilized Preoxygenation: Pre-oxygenation with 100% oxygen Induction Type: IV induction Ventilation: Mask ventilation without difficulty LMA: LMA with gastric port inserted LMA Size: 5.0 Tube type: Oral Number of attempts: 1 Airway Equipment and Method: Oral airway Placement Confirmation: positive ETCO2 Tube secured with: Tape Dental Injury: Teeth and Oropharynx as per pre-operative assessment

## 2018-11-29 NOTE — Consult Note (Addendum)
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  This is a 78 y.o. male with history of hypertension, hyperlipidemia, CAD, right renal cancer status post nephrectomy, prostate cancer status post brachy therapy, and nephrolithiasis who presents for left-sided flank pain.  Pain started Saturday morning and was associated with nausea and decreased p.o. intake.  No fevers or chills.  Pain persisted so he came to the emergency room on 7/19  CT scan on 11/29/2018 shows 5 mm left UVJ stone with associated hydronephrosis.  Surgically absent right kidney.  Afebrile with normal vitals.  No leukocytosis.  Creatinine 5.92 from a baseline of 1.5.  No other metabolic abnormalities.  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  He most recently saw Dr. Amalia Hailey at [facility] in December 2019.  At that time was doing well with a an undetectable PSA and a renal ultrasound that showed no evidence of disease, no left-sided hydronephrosis.  Records reviewed in care everywhere  He does have a significant cardiac history and has 6 cardiac stents.  Takes only baby aspirin daily.  No other anticoagulation.  Works in the [employer]  Past Medical History: Past Medical History:  Diagnosis Date  . Arthritis    "little in my right hand; some in my right big toe" (10/22/2016)  . Borderline diabetes    "tx'd w/RX; blood surgars dropped too low; dr. dc'd it 09/25/2016; blood sugars fine since then" (10/22/2016)  . Chronic kidney disease    S/P right nephrectomy in 1984  . History of gout  04/2016; 09/2016   RLE; RLE  . History of kidney stones   . Hyperlipidemia   . Hypertension   . MGUS (monoclonal gammopathy of unknown significance)   . Migraine    "none in years" (10/22/2016)  . Myeloma ([place])    "dormant for the last 2-3 years" (10/22/2016)  . NSTEMI (non-ST elevated myocardial infarction) ([place]) 09/25/2016  . Oncocytoma 1993   s/p right nephrectomy at [facility] in 1993  . Prostate cancer ([place]) 2006   s/p brachytherapy  . Seborrheic keratosis   . 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   history of nephrectomy in 1984    Past Surgical History:  Past Surgical History:  Procedure Laterality Date  . CARDIAC CATHETERIZATION  1984  . CORONARY ANGIOPLASTY WITH STENT PLACEMENT  09/26/2016; 10/22/2016   "4 stents; 2 stents"  . CORONARY STENT INTERVENTION Right 09/26/2016   Procedure: Coronary Stent Intervention;  Surgeon: Adrian Prows, MD;  Location: [place] [facility];  Service: Cardiovascular;  Laterality: Right;  . CORONARY STENT INTERVENTION N/A 10/22/2016   Procedure: Coronary Stent Intervention;  Surgeon: Adrian Prows, MD;  Location: [place] [facility];  Service: Cardiovascular;  Laterality: N/A;  . INSERTION PROSTATE RADIATION SEED  2009?  Marland Kitchen LEFT HEART CATH AND CORONARY ANGIOGRAPHY N/A 09/26/2016   Procedure: Left Heart Cath and Coronary Angiography;  Surgeon: Adrian Prows, MD;  Location: [place] [facility];  Service: Cardiovascular;  Laterality: N/A;  . NEPHRECTOMY Right 1984   [place]  . REPAIR KNEE LIGAMENT  04/2016   "tore all the ligaments; had to put 3  pins in to reattach; Dr. Gladstone Lighter"  . TONSILLECTOMY      Medication: Current Facility-Administered Medications  Medication Dose Route Frequency Provider Last Rate Last Dose  . lidocaine (XYLOCAINE) 2 % jelly 1 application  1 application Urethral Once PRN Milton Ferguson, MD   Stopped at 11/29/18 1344   Current Outpatient Medications  Medication Sig Dispense Refill  . acetaminophen (TYLENOL) 500 MG tablet Take 1,000 mg by mouth every 6  (six) hours as needed for moderate pain.    Marland Kitchen aspirin EC 81 MG tablet Take 81 mg by mouth daily.    Marland Kitchen atorvastatin (LIPITOR) 80 MG tablet Take 80 mg by mouth every evening.    Marland Kitchen BYSTOLIC 20 MG TABS Take 10 mg by mouth daily. daily    . glimepiride (AMARYL) 2 MG tablet Take 2 mg by mouth daily with breakfast.    . isosorbide mononitrate (IMDUR) 60 MG 24 hr tablet Take 1 tablet (60 mg total) by mouth daily. 30 tablet 0  . latanoprost (XALATAN) 0.005 % ophthalmic solution Place 1 drop into both eyes at bedtime.    Marland Kitchen lisinopril-hydrochlorothiazide (ZESTORETIC) 20-12.5 MG tablet Take 1 tablet by mouth daily.    . Tetrahydrozoline HCl (VISINE OP) Place 1 drop into both eyes daily as needed (dry eyes).    Marland Kitchen amLODipine (NORVASC) 5 MG tablet Take 1 tablet (5 mg total) by mouth daily. (Patient not taking: Reported on 11/29/2018) 30 tablet 0  . glyBURIDE (DIABETA) 2.5 MG tablet Take 1 tablet (2.5 mg total) by mouth daily with breakfast. (Patient not taking: Reported on 11/29/2018) 30 tablet 0  . nitroGLYCERIN (NITROSTAT) 0.4 MG SL tablet Place 1 tablet (0.4 mg total) under the tongue every 5 (five) minutes x 3 doses as needed for chest pain. 25 tablet 1  . olmesartan-hydrochlorothiazide (BENICAR HCT) 20-12.5 MG tablet Take 0.5 tablets by mouth daily. (Patient not taking: Reported on 11/29/2018) 60 tablet 0  . rosuvastatin (CRESTOR) 10 MG tablet Take 1 tablet (10 mg total) by mouth daily. (Patient not taking: Reported on 11/29/2018) 30 tablet 2    Allergies: Allergies  Allergen Reactions  . Sulfa Antibiotics Hives  . Morphine And Related Other (See Comments)    Spontaneous body movements  . Lisinopril Cough    Social History: Social History   Tobacco Use  . Smoking status: Never Smoker  . Smokeless tobacco: Never Used  Substance Use Topics  . Alcohol use: Yes    Comment: very little   . Drug use: No    Family History Family History  Problem Relation Age of Onset  . Diabetes Mother   . Heart  attack Father     Review of Systems 10 systems were reviewed and are negative except as noted specifically in the HPI.  Objective   Vital signs in last 24 hours: BP (!) 145/72   Pulse 61   Temp 98.2 F (36.8 C) (Oral)   Resp (!) 22   Ht 6' (1.829 m)   Wt 88 kg   SpO2 100%   BMI 26.31 kg/m   Physical Exam General: NAD, A&O, resting, appropriate HEENT: [place]/AT, EOMI, MMM Pulmonary: Normal work of breathing Cardiovascular: HDS, adequate peripheral perfusion Abdomen: Soft, NTTP, nondistended, . GU: Mild left CVA tenderness Extremities: warm and well perfused Neuro: Appropriate, no focal neurological deficits  Most Recent Labs: Lab Results  Component Value Date   WBC 9.2 11/29/2018   HGB 14.5 11/29/2018   HCT 44.1 11/29/2018   PLT 163 11/29/2018  Lab Results  Component Value Date   NA 140 11/29/2018   K 4.6 11/29/2018   CL 105 11/29/2018   CO2 21 (L) 11/29/2018   BUN 60 (H) 11/29/2018   CREATININE 5.92 (H) 11/29/2018   CALCIUM 8.9 11/29/2018    Lab Results  Component Value Date   INR 0.96 09/25/2016   APTT 27 09/25/2016     IMAGING: Ct Renal Stone Study  Result Date: 11/29/2018 CLINICAL DATA:  Blank pain and abdominal pain since yesterday. Hematuria. History of prostate cancer and right nephrectomy. History of nephrolithiasis greater than 40 years ago. EXAM: CT ABDOMEN AND PELVIS WITHOUT CONTRAST TECHNIQUE: Multidetector CT imaging of the abdomen and pelvis was performed following the standard protocol without IV contrast. COMPARISON:  PET-CT dated 02/19/2018. FINDINGS: Lower chest: Right coronary artery stent. Mildly enlarged heart. Clear lung bases. Hepatobiliary: No focal liver abnormality is seen. No gallstones, gallbladder wall thickening, or biliary dilatation. Pancreas: Unremarkable. No pancreatic ductal dilatation or surrounding inflammatory changes. Spleen: Normal in size without focal abnormality. Adrenals/Urinary Tract: Stable post nephrectomy changes  on the right. Normal appearing adrenal glands. Interval mild dilatation of the left renal collecting system and ureter to the level of a 5 mm calculus in the distal ureter, just proximal to the ureterovesical junction. Mild left perinephric and periureteric soft tissue stranding. This represents the previously demonstrated lower pole left renal calculus, no longer seen in the kidney. No significant urine in the bladder. No bladder calculi seen Stomach/Bowel: Multiple colonic diverticula without evidence of diverticulitis. Unremarkable stomach, small bowel and appendix. Vascular/Lymphatic: Atheromatous arterial calcifications without aneurysm. No enlarged lymph nodes. Reproductive: Prostate radiation seed implants. Other: Small bilateral inguinal hernias containing fat. Small umbilical hernia containing fat. Musculoskeletal: Lumbar and lower thoracic spine degenerative changes. No evidence of bony metastatic disease. IMPRESSION: 1. 5 mm distal left ureteral calculus causing mild left hydronephrosis and hydroureter. 2. Colonic diverticulosis. Electronically Signed   By: Claudie Revering M.D.   On: 11/29/2018 14:34    ------  Assessment:  78 y.o. male with solitary kidney and obstructing left ureteral stone.  Associated AKI but no sign of infection   Recommendations: -Posted for cystourethroscopy, left retrograde pyelogram, left ureteral stent placement, possible left ureteroscopic stone extraction with laser lithotripsy  -Patient marked, consent order placed  - Please keep patient n.p.o.  - Rapid COVID swab prior to the operating room  - Urinalysis to be obtained at the start of the procedure as patient cannot void and has an empty bladder on CT scan  - He will need a second procedure for removal in the next several weeks.  Given longstanding relationship with [facility] urology recommend the patient be seen there but we would be happy to see him here as well if that is his preference   Thank you  for this consult. Please contact the urology consult pager with any further questions/concerns.

## 2018-11-30 ENCOUNTER — Encounter (HOSPITAL_COMMUNITY): Payer: Self-pay | Admitting: Urology

## 2018-11-30 DIAGNOSIS — E1169 Type 2 diabetes mellitus with other specified complication: Secondary | ICD-10-CM

## 2018-11-30 DIAGNOSIS — E785 Hyperlipidemia, unspecified: Secondary | ICD-10-CM

## 2018-11-30 LAB — CBC
HCT: 41 % (ref 39.0–52.0)
Hemoglobin: 13.1 g/dL (ref 13.0–17.0)
MCH: 29.3 pg (ref 26.0–34.0)
MCHC: 32 g/dL (ref 30.0–36.0)
MCV: 91.7 fL (ref 80.0–100.0)
Platelets: 148 10*3/uL — ABNORMAL LOW (ref 150–400)
RBC: 4.47 MIL/uL (ref 4.22–5.81)
RDW: 12.6 % (ref 11.5–15.5)
WBC: 10.4 10*3/uL (ref 4.0–10.5)
nRBC: 0 % (ref 0.0–0.2)

## 2018-11-30 LAB — COMPREHENSIVE METABOLIC PANEL
ALT: 17 U/L (ref 0–44)
AST: 17 U/L (ref 15–41)
Albumin: 3.4 g/dL — ABNORMAL LOW (ref 3.5–5.0)
Alkaline Phosphatase: 73 U/L (ref 38–126)
Anion gap: 10 (ref 5–15)
BUN: 54 mg/dL — ABNORMAL HIGH (ref 8–23)
CO2: 20 mmol/L — ABNORMAL LOW (ref 22–32)
Calcium: 8.3 mg/dL — ABNORMAL LOW (ref 8.9–10.3)
Chloride: 109 mmol/L (ref 98–111)
Creatinine, Ser: 4.39 mg/dL — ABNORMAL HIGH (ref 0.61–1.24)
GFR calc Af Amer: 14 mL/min — ABNORMAL LOW (ref >60)
GFR calc non Af Amer: 12 mL/min — ABNORMAL LOW (ref >60)
Glucose, Bld: 235 mg/dL — ABNORMAL HIGH (ref 70–99)
Potassium: 5.5 mmol/L — ABNORMAL HIGH (ref 3.5–5.1)
Sodium: 139 mmol/L (ref 135–145)
Total Bilirubin: 0.8 mg/dL (ref 0.3–1.2)
Total Protein: 5.7 g/dL — ABNORMAL LOW (ref 6.5–8.1)

## 2018-11-30 LAB — GLUCOSE, CAPILLARY
Glucose-Capillary: 194 mg/dL — ABNORMAL HIGH (ref 70–99)
Glucose-Capillary: 247 mg/dL — ABNORMAL HIGH (ref 70–99)
Glucose-Capillary: 192 mg/dL — ABNORMAL HIGH (ref 70–99)
Glucose-Capillary: 207 mg/dL — ABNORMAL HIGH (ref 70–99)
Glucose-Capillary: 168 mg/dL — ABNORMAL HIGH (ref 70–99)

## 2018-11-30 LAB — HEMOGLOBIN A1C
Hgb A1c MFr Bld: 7.1 % — ABNORMAL HIGH (ref 4.8–5.6)
Mean Plasma Glucose: 157.07 mg/dL

## 2018-11-30 LAB — URINE CULTURE: Culture: <10000 — AB

## 2018-11-30 MED ORDER — INSULIN ASPART 100 UNIT/ML ~~LOC~~ SOLN
0.0000 [IU] | Freq: Three times a day (TID) | SUBCUTANEOUS | Status: DC
  Administered 2018-11-30: 5 [IU] via SUBCUTANEOUS
  Administered 2018-12-01: 3 [IU] via SUBCUTANEOUS

## 2018-11-30 MED ORDER — MORPHINE SULFATE (PF) 2 MG/ML IV SOLN
1.0000 mg | INTRAVENOUS | Status: DC | PRN
  Filled 2018-11-30: qty 1

## 2018-11-30 MED ORDER — OXYBUTYNIN CHLORIDE 5 MG PO TABS
5.0000 mg | ORAL_TABLET | Freq: Once | ORAL | Status: AC
  Administered 2018-11-30: 5 mg via ORAL
  Filled 2018-11-30: qty 1

## 2018-11-30 MED ORDER — TRAMADOL HCL 50 MG PO TABS
50.0000 mg | ORAL_TABLET | Freq: Two times a day (BID) | ORAL | Status: DC | PRN
  Administered 2018-11-30 – 2018-12-02 (×3): 50 mg via ORAL
  Filled 2018-11-30 (×3): qty 1

## 2018-11-30 MED ORDER — INSULIN ASPART 100 UNIT/ML ~~LOC~~ SOLN: 0.0000 [IU] | Freq: Every day | SUBCUTANEOUS | Status: DC

## 2018-11-30 MED ORDER — SODIUM CHLORIDE 0.45 % IV SOLN
INTRAVENOUS | Status: DC
  Administered 2018-11-30 – 2018-12-02 (×4): via INTRAVENOUS

## 2018-11-30 NOTE — Progress Notes (Signed)
PROGRESS NOTE    [name]  BDZ:[id number] DOB: 04-16-1941 DOA: 11/29/2018 PCP: Burnard Bunting, MD    Brief Narrative:  78 y.o. male with medical history significant of DM2, HTN, HLD, RLE, solitary kidney who presented to the ED with complaints of L flank pain that started on the early AM of hospital admit. Pt also reported no significant urine output since onset. 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 Pt also has significant CAD hx and is s/p multiple stents, most recent was over 1 year prior to admit. Pt is normally followed by Dr. Einar Gip. Currently denies fevers, chills, sweats. Denies chest pain or sob.  ED Course: In the ED, patient was noted to have Cr of 5.92 (baseline around 1.6-1.7). No leukocytosis.Hgb of 14.5. Pt underwent renal stone CT with findings of 30mm distal L ureteral calculus resulting in mild L hydronephrosis and hydroureter. Urology was consulted and pt was planned for cystourethroscopy. Hospitalist consulted for consideration for medical admission.  Assessment & Plan:   Principal Problem:   ARF (acute renal failure) (HCC) Active Problems:   Hypertension   Chronic kidney disease   Hyperlipidemia   CAD (coronary artery disease), native coronary artery   Single kidney   Urinary tract obstruction by kidney stone   Type 2 diabetes mellitus with hyperlipidemia ([place])  1. ARF 1. Baseline Cr of 1.6-1.7 2. Presenting Cr at 5.92 3. Likely secondary to obstrucive disease from renal calculi in setting of solitary kidney 4. Urology following, s/p lithotripsy 7/20 5. No urine output prior to admit 6. Have been continued on IVF hydration 7. Continue to avoid nephrotoxic agents 8. Since lithotripsy, pt now with over 700cc output overnight and Cr down to 4.39 9. Given hx of solitary kidney and initially no urine output 2. HTN 1. BP currently stable 2. Have continued on BP meds with exception of lisinopril-HCTZ given ARF 3. Continue  PRN hydralazine 3. HLD 1. Continue lipitor as tolerated per home meds 4. CAD 1. Remains without chest pain 2. Pt has hx of multiple stents, last was over 59yr prior to admit 3. Most recent 2d echo from 2018 demonstrated EF 50-55% with akinesis of anterolateral myocardium and hypokinesis of mid-distal anterolateral myocardium 4. Followed by Dr. Einar Gip 5. Hx solitary kidney 1. Secondary to nephrectomy following diagnosis or R renal cell cancer 2. Now with 700cc output overnight. Cr of 4.3 this AM 3. Nephrology was consulted 6. Obstructing renal calculi 1. Urology following, s/p lithotripsy 7/19 2. Foley cath in place.  3. Urology has since signed off 7. DM2 with hyperlipidemia 1. Most recent a1c from 2018 of 7.5 2. Repeat A1c of 7.1 3. Will continue on moderate scale SSI 4. Holding oral hyperglycemic while in hospital  DVT prophylaxis: SCD's Code Status: DNR Family Communication: Pt in room, family not at bedside Disposition Plan: Uncertain at this time  Consultants:   Urology  Nephrology  Procedures:   Lithotripsy 7/19  Antimicrobials: Anti-infectives (From admission, onward)   Start     Dose/Rate Route Frequency Ordered Stop   11/29/18 1845  ceFAZolin (ANCEF) IVPB 2g/100 mL premix     2 g 200 mL/hr over 30 Minutes Intravenous  Once 11/29/18 1839 11/29/18 1807   11/29/18 1800  ceFAZolin (ANCEF) IVPB 2g/100 mL premix     2 g 200 mL/hr over 30 Minutes Intravenous  Once 11/29/18 1757 11/29/18 1829   11/29/18 1753  ceFAZolin (ANCEF) 2-4 GM/100ML-% IVPB  Note to Pharmacy: Marquis Buggy   : cabinet override      11/29/18 1753 11/30/18 0559       Subjective: Reports feeling somewhat better today  Objective: Vitals:   11/30/18 0500 11/30/18 0603 11/30/18 0807 11/30/18 1407  BP:  138/68 (!) 150/70 139/71  Pulse:  (!) 57 64 (!) 55  Resp:  18  (!) 21  Temp:  98.2 F (36.8 C)  97.8 F (36.6 C)  TempSrc:  Oral  Oral  SpO2:  98%  95%  Weight: 90.9 kg     Height:         Intake/Output Summary (Last 24 hours) at 11/30/2018 1504 Last data filed at 11/30/2018 1400 Gross per 24 hour  Intake 3060.61 ml  Output 1075 ml  Net 1985.61 ml   Filed Weights   11/29/18 1244 11/30/18 0500  Weight: 88 kg 90.9 kg    Examination:  General exam: Appears calm and comfortable  Respiratory system: Clear to auscultation. Respiratory effort normal. Cardiovascular system: S1 & S2 heard, RRR Gastrointestinal system: Abdomen is nondistended, soft and nontender. No organomegaly or masses felt. Normal bowel sounds heard. Central nervous system: Alert and oriented. No focal neurological deficits. Extremities: Symmetric 5 x 5 power. Skin: No rashes, lesions Psychiatry: Judgement and insight appear normal. Mood & affect appropriate.   Data Reviewed: I have personally reviewed following labs and imaging studies  CBC: Recent Labs  Lab 11/29/18 1255 11/30/18 0550  WBC 9.2 10.4  NEUTROABS 6.0  --   HGB 14.5 13.1  HCT 44.1 41.0  MCV 91.9 91.7  PLT 163 811*   Basic Metabolic Panel: Recent Labs  Lab 11/29/18 1255 11/30/18 0550  NA 140 139  K 4.6 5.5*  CL 105 109  CO2 21* 20*  GLUCOSE 248* 235*  BUN 60* 54*  CREATININE 5.92* 4.39*  CALCIUM 8.9 8.3*   GFR: Estimated Creatinine Clearance: 15 mL/min (A) (by C-G formula based on SCr of 4.39 mg/dL (H)). Liver Function Tests: Recent Labs  Lab 11/29/18 1255 11/30/18 0550  AST 22 17  ALT 23 17  ALKPHOS 85 73  BILITOT 0.6 0.8  PROT 6.5 5.7*  ALBUMIN 4.0 3.4*   No results for input(s): LIPASE, AMYLASE in the last 168 hours. No results for input(s): AMMONIA in the last 168 hours. Coagulation Profile: No results for input(s): INR, PROTIME in the last 168 hours. Cardiac Enzymes: No results for input(s): CKTOTAL, CKMB, CKMBINDEX, TROPONINI in the last 168 hours. BNP (last 3 results) No results for input(s): PROBNP in the last 8760 hours. HbA1C: Recent Labs    11/30/18 0550  HGBA1C 7.1*   CBG: Recent  Labs  Lab 11/29/18 1911 11/29/18 2030 11/30/18 0805 11/30/18 1153  GLUCAP 186* 194* 247* 192*   Lipid Profile: No results for input(s): CHOL, HDL, LDLCALC, TRIG, CHOLHDL, LDLDIRECT in the last 72 hours. Thyroid Function Tests: No results for input(s): TSH, T4TOTAL, FREET4, T3FREE, THYROIDAB in the last 72 hours. Anemia Panel: No results for input(s): VITAMINB12, FOLATE, FERRITIN, TIBC, IRON, RETICCTPCT in the last 72 hours. Sepsis Labs: No results for input(s): PROCALCITON, LATICACIDVEN in the last 168 hours.  Recent Results (from the past 240 hour(s))  SARS Coronavirus 2 (CEPHEID - Performed in [place] hospital lab), Hosp Order     Status: None   Collection Time: 11/29/18  3:21 PM   Specimen: Nasopharyngeal Swab  Result Value Ref Range Status   SARS Coronavirus 2 NEGATIVE NEGATIVE Final    Comment: (NOTE) If  result is NEGATIVE SARS-CoV-2 target nucleic acids are NOT DETECTED. The SARS-CoV-2 RNA is generally detectable in upper and lower  respiratory specimens during the acute phase of infection. The lowest  concentration of SARS-CoV-2 viral copies this assay can detect is 250  copies / mL. A negative result does not preclude SARS-CoV-2 infection  and should not be used as the sole basis for treatment or other  patient management decisions.  A negative result may occur with  improper specimen collection / handling, submission of specimen other  than nasopharyngeal swab, presence of viral mutation(s) within the  areas targeted by this assay, and inadequate number of viral copies  (<250 copies / mL). A negative result must be combined with clinical  observations, patient history, and epidemiological information. If result is POSITIVE SARS-CoV-2 target nucleic acids are DETECTED. The SARS-CoV-2 RNA is generally detectable in upper and lower  respiratory specimens dur ing the acute phase of infection.  Positive  results are indicative of active infection with SARS-CoV-2.   Clinical  correlation with patient history and other diagnostic information is  necessary to determine patient infection status.  Positive results do  not rule out bacterial infection or co-infection with other viruses. If result is PRESUMPTIVE POSTIVE SARS-CoV-2 nucleic acids MAY BE PRESENT.   A presumptive positive result was obtained on the submitted specimen  and confirmed on repeat testing.  While 2019 novel coronavirus  (SARS-CoV-2) nucleic acids may be present in the submitted sample  additional confirmatory testing may be necessary for epidemiological  and / or clinical management purposes  to differentiate between  SARS-CoV-2 and other Sarbecovirus currently known to infect humans.  If clinically indicated additional testing with an alternate test  methodology [phone]) is advised. The SARS-CoV-2 RNA is generally  detectable in upper and lower respiratory sp ecimens during the acute  phase of infection. The expected result is Negative. Fact Sheet for Patients:  StrictlyIdeas.no Fact Sheet for Healthcare Providers: BankingDealers.co.za This test is not yet approved or cleared by the [place] FDA and has been authorized for detection and/or diagnosis of SARS-CoV-2 by FDA under an Emergency Use Authorization (EUA).  This EUA will remain in effect (meaning this test can be used) for the duration of the COVID-19 declaration under Section 564(b)(1) of the Act, 21 U.S.C. section 360bbb-3(b)(1), unless the authorization is terminated or revoked sooner. Performed at [facility] [place] [address] [place], [place] [address]      Radiology Studies: Dg C-arm 1-60 Min-no Report  Result Date: 11/29/2018 Fluoroscopy was utilized by the requesting physician.  No radiographic interpretation.   Ct Renal Stone Study  Result Date: 11/29/2018 CLINICAL DATA:  Blank pain and abdominal pain since yesterday. Hematuria.  History of prostate cancer and right nephrectomy. History of nephrolithiasis greater than 40 years ago. EXAM: CT ABDOMEN AND PELVIS WITHOUT CONTRAST TECHNIQUE: Multidetector CT imaging of the abdomen and pelvis was performed following the standard protocol without IV contrast. COMPARISON:  PET-CT dated 02/19/2018. FINDINGS: Lower chest: Right coronary artery stent. Mildly enlarged heart. Clear lung bases. Hepatobiliary: No focal liver abnormality is seen. No gallstones, gallbladder wall thickening, or biliary dilatation. Pancreas: Unremarkable. No pancreatic ductal dilatation or surrounding inflammatory changes. Spleen: Normal in size without focal abnormality. Adrenals/Urinary Tract: Stable post nephrectomy changes on the right. Normal appearing adrenal glands. Interval mild dilatation of the left renal collecting system and ureter to the level of a 5 mm calculus in the distal ureter, just proximal to the ureterovesical junction. Mild  left perinephric and periureteric soft tissue stranding. This represents the previously demonstrated lower pole left renal calculus, no longer seen in the kidney. No significant urine in the bladder. No bladder calculi seen Stomach/Bowel: Multiple colonic diverticula without evidence of diverticulitis. Unremarkable stomach, small bowel and appendix. Vascular/Lymphatic: Atheromatous arterial calcifications without aneurysm. No enlarged lymph nodes. Reproductive: Prostate radiation seed implants. Other: Small bilateral inguinal hernias containing fat. Small umbilical hernia containing fat. Musculoskeletal: Lumbar and lower thoracic spine degenerative changes. No evidence of bony metastatic disease. IMPRESSION: 1. 5 mm distal left ureteral calculus causing mild left hydronephrosis and hydroureter. 2. Colonic diverticulosis. Electronically Signed   By: Claudie Revering M.D.   On: 11/29/2018 14:34    Scheduled Meds: . atorvastatin  80 mg Oral QPM  . insulin aspart  0-5 Units Subcutaneous  QHS  . insulin aspart  0-9 Units Subcutaneous TID WC  . isosorbide mononitrate  60 mg Oral Daily  . nebivolol  10 mg Oral Daily   Continuous Infusions: . lactated ringers 75 mL/hr at 11/30/18 1400     LOS: 1 day   Marylu Lund, MD [facility] Pager On Amion  If 7PM-7AM, please contact night-coverage 11/30/2018, 3:04 PM

## 2018-11-30 NOTE — Progress Notes (Signed)
Urology Progress Note   1 Day Post-Op ureteroscopic stone extraction of solitary kidney  Subjective: NAEON.  Tolerated procedure well.  Foley catheter in place with adequate output.  Creatinine downtrending  Objective: Vital signs in last 24 hours: Temp:  [97.9 F (36.6 C)-98.3 F (36.8 C)] 98.2 F (36.8 C) (07/20 0603) Pulse Rate:  [49-74] 64 (07/20 0807) Resp:  [14-22] 18 (07/20 0603) BP: (138-160)/(64-80) 150/70 (07/20 0807) SpO2:  [98 %-100 %] 98 % (07/20 0603) Weight:  [88 kg-90.9 kg] 90.9 kg (07/20 0500)  Intake/Output from previous day: 07/19 0701 - 07/20 0700 In: 1817 [P.O.:240; I.V.:1577] Out: 775 [Urine:775] Intake/Output this shift: Total I/O In: 703.7 [P.O.:240; I.V.:463.7] Out: 300 [Urine:300]  Physical Exam:  GU: Foley catheter with light pink urine  Lab Results: Recent Labs    11/29/18 1255 11/30/18 0550  HGB 14.5 13.1  HCT 44.1 41.0   BMET Recent Labs    11/29/18 1255 11/30/18 0550  NA 140 139  K 4.6 5.5*  CL 105 109  CO2 21* 20*  GLUCOSE 248* 235*  BUN 60* 54*  CREATININE 5.92* 4.39*  CALCIUM 8.9 8.3*     Assessment/Plan:  78 y.o. male s/p left USE.  Overall doing well post-op.   - Ok to remove foley from a urology perspective. Final decision on foley removal up to primary team - Follow up with Urology in 1- 2 weeks for stent removal. We will call him with this appointment date and time  - Stone taken to office for analysis - Stent discomfort medications: Flomax 0.4mg  QHS, Ditropan 5mg  TID PRN, Pyridium 100mg  Q8 PRN - Ok for oxycodone PRN for post op pain. Would send home with 10 pain pills  - No further urologic needs prior to discharge. Timing of discharge will depend on renal function and is up to the hospitalist team   Dispo: Floor   LOS: 1 day   Tharon Aquas 11/30/2018, 11:53 AM

## 2018-11-30 NOTE — Consult Note (Signed)
Renal Service Consult Note [name]  [name] 11/30/2018 [name] Requesting Physician:  Dr. Wyline Townsend, S.   Reason for Consult:  AKI on CKD III HPI: The patient is a 78 y.o. year-old with hx of prostate Ca, NSTEMI/ CAD sp stenting, MGUS, HTN, HL, kidney stones, gout, CKD III, renal cell Ca sp R nephrectomy in 1984 who presented to ED yesterday w/ L flank pain < 24 hrs and ARF w/ creat 5.9 in ED.  CT showed distal ureteral stone at ureterovesicular junction w/ L hydro.  Urology saw pt and did ureteroscopy w/ stone removal using laser lithotropsy and placement of L ureteral stent.  Making urine, creat down some to 4.6 today.  Asked to see for AKI on CKDIII.    Creat 1.5- 1.9 from 2011 - 2018, eGFR was 30 - 40 ml/ min for same time period.   Patient is w/o any c/o SOB, chest pain, N/V/D, diarrhea.  Some pain from the foley cath.     ROS  denies CP  no joint pain   no HA  no blurry vision  no rash  no diarrhea  no nausiea   Past Medical History  Past Medical History:  Diagnosis Date  . Arthritis    "little in my right hand; some in my right big toe" (10/22/2016)  . Borderline diabetes    "tx'd w/RX; blood surgars dropped too low; dr. dc'd it 09/25/2016; blood sugars fine since then" (10/22/2016)  . Chronic kidney disease    S/P right nephrectomy in 1984  . History of gout 04/2016; 09/2016   RLE; RLE  . History of kidney stones   . Hyperlipidemia   . Hypertension   . MGUS (monoclonal gammopathy of unknown significance)   . Migraine    "none in years" (10/22/2016)  . Myeloma ([place])    "dormant for the last 2-3 years" (10/22/2016)  . NSTEMI (non-ST elevated myocardial infarction) ([place]) 09/25/2016  . 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 Prostate cancer ([name]) 2006   s/p brachytherapy  . Seborrheic keratosis   . 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   history of nephrectomy in 1984   Past Surgical History  Past Surgical History:  Procedure  Laterality Date  . CARDIAC CATHETERIZATION  1984  . CORONARY ANGIOPLASTY WITH STENT PLACEMENT  09/26/2016; 10/22/2016   "4 stents; 2 stents"  . CORONARY STENT INTERVENTION Right 09/26/2016   Procedure: Coronary Stent Intervention;  Surgeon: Andre Prows, MD;  Location: [place] [facility];  Service: Cardiovascular;  Laterality: Right;  . CORONARY STENT INTERVENTION N/A 10/22/2016   Procedure: Coronary Stent Intervention;  Surgeon: Andre Prows, MD;  Location: [place] [facility];  Service: Cardiovascular;  Laterality: N/A;  . CYSTOSCOPY/URETEROSCOPY/HOLMIUM LASER/STENT PLACEMENT Left 11/29/2018   Procedure: CYSTOSCOPY/URETEROSCOPY/HOLMIUM LASER/STENT PLACEMENTleft retrograde pylegram;  Surgeon: Andre Hughs, MD;  Location: [facility];  Service: Urology;  Laterality: Left;  . HOLMIUM LASER APPLICATION  8/75/6433   Procedure: HOLMIUM LASER APPLICATION;  Surgeon: Andre Hughs, MD;  Location: [facility];  Service: Urology;;  . [place]  2009?  Marland Kitchen LEFT HEART CATH AND CORONARY ANGIOGRAPHY N/A 09/26/2016   Procedure: Left Heart Cath and Coronary Angiography;  Surgeon: Andre Prows, MD;  Location: [place] [facility];  Service: Cardiovascular;  Laterality: N/A;  . NEPHRECTOMY Right 1984   [place]  . REPAIR KNEE LIGAMENT  04/2016   "tore all the ligaments; had to put 3 pins in to  reattach; Dr. Gladstone Townsend"  . TONSILLECTOMY     Family History  Family History  Problem Relation Age of Onset  . Diabetes Mother   . Heart attack Father    Social History  reports that he has never smoked. He has never used smokeless tobacco. He reports current alcohol use. He reports that he does not use drugs. Allergies  Allergies  Allergen Reactions  . Sulfa Antibiotics Hives  . Morphine And Related Other (See Comments)    Spontaneous body movements  . Lisinopril Cough   Home medications Prior to Admission medications   Medication Sig Start Date End Date Taking? Authorizing Provider  acetaminophen  (TYLENOL) 500 MG tablet Take 1,000 mg by mouth every 6 (six) hours as needed for moderate pain.   Yes [provider]  aspirin EC 81 MG tablet Take 81 mg by mouth daily.   Yes [provider]  atorvastatin (LIPITOR) 80 MG tablet Take 80 mg by mouth every evening.   Yes [provider]  BYSTOLIC 20 MG TABS Take 10 mg by mouth daily. daily   Yes [provider]  glimepiride (AMARYL) 2 MG tablet Take 2 mg by mouth daily with breakfast.   Yes [provider]  isosorbide mononitrate (IMDUR) 60 MG 24 hr tablet Take 1 tablet (60 mg total) by mouth daily. 11/16/18  Yes Andre Dunn, NP  latanoprost (XALATAN) 0.005 % ophthalmic solution Place 1 drop into both eyes at bedtime.   Yes [provider]  lisinopril-hydrochlorothiazide (ZESTORETIC) 20-12.5 MG tablet Take 1 tablet by mouth daily.   Yes [provider]  Tetrahydrozoline HCl (VISINE OP) Place 1 drop into both eyes daily as needed (dry eyes).   Yes [provider]  amLODipine (NORVASC) 5 MG tablet Take 1 tablet (5 mg total) by mouth daily. Patient not taking: Reported on 11/29/2018 11/16/18   Andre Dunn, NP  glyBURIDE (DIABETA) 2.5 MG tablet Take 1 tablet (2.5 mg total) by mouth daily with breakfast. Patient not taking: Reported on 11/29/2018 09/28/16   Andre Prows, MD  nitroGLYCERIN (NITROSTAT) 0.4 MG SL tablet Place 1 tablet (0.4 mg total) under the tongue every 5 (five) minutes x 3 doses as needed for chest pain. 07/26/18   Andre Prows, MD  olmesartan-hydrochlorothiazide (BENICAR HCT) 20-12.5 MG tablet Take 0.5 tablets by mouth daily. Patient not taking: Reported on 11/29/2018 11/06/18   Andre Prows, MD  rosuvastatin (CRESTOR) 10 MG tablet Take 1 tablet (10 mg total) by mouth daily. Patient not taking: Reported on 11/29/2018 11/17/18 02/15/19  Andre Prows, MD   Liver Function Tests Recent Labs  Lab 11/29/18 1255 11/30/18 0550  AST 22 17  ALT 23 17  ALKPHOS 85 73   BILITOT 0.6 0.8  PROT 6.5 5.7*  ALBUMIN 4.0 3.4*   No results for input(s): LIPASE, AMYLASE in the last 168 hours. CBC Recent Labs  Lab 11/29/18 1255 11/30/18 0550  WBC 9.2 10.4  NEUTROABS 6.0  --   HGB 14.5 13.1  HCT 44.1 41.0  MCV 91.9 91.7  PLT 163 916*   Basic Metabolic Panel Recent Labs  Lab 11/29/18 1255 11/30/18 0550  NA 140 139  K 4.6 5.5*  CL 105 109  CO2 21* 20*  GLUCOSE 248* 235*  BUN 60* 54*  CREATININE 5.92* 4.39*  CALCIUM 8.9 8.3*   Iron/TIBC/Ferritin/ %Sat No results found for: IRON, TIBC, FERRITIN, IRONPCTSAT  Vitals:   11/30/18 0500 11/30/18 0603 11/30/18 0807 11/30/18 1407  BP:  138/68 Marland Kitchen)  150/70 139/71  Pulse:  (!) 57 64 (!) 55  Resp:  18  (!) 21  Temp:  98.2 F (36.8 C)  97.8 F (36.6 C)  TempSrc:  Oral  Oral  SpO2:  98%  95%  Weight: 90.9 kg     Height:        Exam Gen alert, elderly WM no distress No rash, cyanosis or gangrene Sclera anicteric, throat clear  No jvd or bruits Chest clear bilat to bases RRR no MRG Abd soft ntnd no mass or ascites +bs GU normal male w/ foley cath draining blood-tinged urine MS no joint effusions or deformity Ext no LE edema, no wounds or ulcers Neuro is alert, Ox 3 , nf   Assessment/ Plan: 1. Acute renal failure on CKD III - due to obstruction, sp L ureteral procedure w/ relief of obstruction using laser lithotripsy.  Creat improving, pt voiding w/ foley cath in.  Getting IVF's at 75 cc/hr, euvolemic on exam.  Recommend cont IVF's, no other suggestions. Check creat in am. Will follow.  2. H/o R nephrectomy d/t RCCa 3. H/o prostate Ca 4. H/o CAD w/ stenting 5. HTN - bp's stable 6. HL      Kelly Splinter  MD 11/30/2018, 3:29 PM

## 2018-12-01 LAB — GLUCOSE, CAPILLARY
Glucose-Capillary: 75 mg/dL (ref 70–99)
Glucose-Capillary: 101 mg/dL — ABNORMAL HIGH (ref 70–99)
Glucose-Capillary: 156 mg/dL — ABNORMAL HIGH (ref 70–99)
Glucose-Capillary: 88 mg/dL (ref 70–99)

## 2018-12-01 LAB — BASIC METABOLIC PANEL
Anion gap: 9 (ref 5–15)
BUN: 45 mg/dL — ABNORMAL HIGH (ref 8–23)
CO2: 21 mmol/L — ABNORMAL LOW (ref 22–32)
Calcium: 8.2 mg/dL — ABNORMAL LOW (ref 8.9–10.3)
Chloride: 107 mmol/L (ref 98–111)
Creatinine, Ser: 2.63 mg/dL — ABNORMAL HIGH (ref 0.61–1.24)
GFR calc Af Amer: 26 mL/min — ABNORMAL LOW (ref >60)
GFR calc non Af Amer: 22 mL/min — ABNORMAL LOW (ref >60)
Glucose, Bld: 104 mg/dL — ABNORMAL HIGH (ref 70–99)
Potassium: 4.3 mmol/L (ref 3.5–5.1)
Sodium: 137 mmol/L (ref 135–145)

## 2018-12-01 NOTE — Progress Notes (Signed)
PROGRESS NOTE    JOAQUIM TOLEN  FUX:[id number] DOB: 14-Apr-1941 DOA: 11/29/2018 PCP: Burnard Bunting, MD    Brief Narrative:  78 y.o. male with medical history significant of DM2, HTN, HLD, RLE, solitary kidney who presented to the ED with complaints of L flank pain that started on the early AM of hospital admit. Pt also reported no significant urine output since onset. 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 Pt also has significant CAD hx and is s/p multiple stents, most recent was over 1 year prior to admit. Pt is normally followed by Dr. Einar Gip. Currently denies fevers, chills, sweats. Denies chest pain or sob.  ED Course: In the ED, patient was noted to have Cr of 5.92 (baseline around 1.6-1.7). No leukocytosis.Hgb of 14.5. Pt underwent renal stone CT with findings of 68mm distal L ureteral calculus resulting in mild L hydronephrosis and hydroureter. Urology was consulted and pt was planned for cystourethroscopy. Hospitalist consulted for consideration for medical admission.  Assessment & Plan:   Principal Problem:   ARF (acute renal failure) (HCC) Active Problems:   Hypertension   Chronic kidney disease   Hyperlipidemia   CAD (coronary artery disease), native coronary artery   Single kidney   Urinary tract obstruction by kidney stone   Type 2 diabetes mellitus with hyperlipidemia ([place])  1. ARF 1. Baseline Cr of 1.6-1.7 2. Presenting Cr at 5.92 3. Likely secondary to obstrucive disease from renal calculi in setting of solitary kidney 4. Urology following, s/p lithotripsy 7/20 5. No urine output prior to admit 6. Have been continued on IVF hydration 7. Continue to avoid nephrotoxic agents 8. Nephrology following 9. Cr now trending down, currently 2.63 2. HTN 1. BP currently stable 2. Have continued on BP meds with exception of lisinopril-HCTZ given ARF 3. Continue PRN hydralazine as tolerated 3. HLD 1. Continue lipitor as tolerated per  home meds 4. CAD 1. Remains without chest pain 2. Pt has hx of multiple stents, last was over 62yr prior to admit 3. Most recent 2d echo from 2018 demonstrated EF 50-55% with akinesis of anterolateral myocardium and hypokinesis of mid-distal anterolateral myocardium 4. Followed by Dr. Einar Gip as outpatient 5. Hx solitary kidney 1. Secondary to nephrectomy following diagnosis or R renal cell cancer 2. Now with 700cc output overnight. Cr down to 2.63 this AM 3. Nephrology following 6. Obstructing renal calculi 1. Urology following, s/p lithotripsy 7/19 2. Urology has since signed off 3. Pt reports pain around foley site. Will d/c foley 7. DM2 with hyperlipidemia 1. Most recent a1c from 2018 of 7.5 2. Repeat A1c of 7.1 3. Will continue on moderate scale SSI 4. Holding oral hyperglycemic while in hospital  DVT prophylaxis: SCD's Code Status: DNR Family Communication: Pt in room, family not at bedside Disposition Plan: Possible home in 24hrs if Cr cont to improve  Consultants:   Urology  Nephrology  Procedures:   Lithotripsy 7/19  Antimicrobials: Anti-infectives (From admission, onward)   Start     Dose/Rate Route Frequency Ordered Stop   11/29/18 1845  ceFAZolin (ANCEF) IVPB 2g/100 mL premix     2 g 200 mL/hr over 30 Minutes Intravenous  Once 11/29/18 1839 11/29/18 1807   11/29/18 1800  ceFAZolin (ANCEF) IVPB 2g/100 mL premix     2 g 200 mL/hr over 30 Minutes Intravenous  Once 11/29/18 1757 11/29/18 1829   11/29/18 1753  ceFAZolin (ANCEF) 2-4 GM/100ML-% IVPB    Note to Pharmacy:  Marquis Buggy   : cabinet override      11/29/18 1753 11/30/18 0559      Subjective: Without complaints. Eager to ambulate  Objective: Vitals:   11/30/18 1407 11/30/18 2315 12/01/18 0548 12/01/18 1347  BP: 139/71 (!) 142/63 139/75 (!) 144/66  Pulse: (!) 55 (!) 44 (!) 51 60  Resp: (!) 21 18 18 18   Temp: 97.8 F (36.6 C) 98.4 F (36.9 C) 98.9 F (37.2 C) 98.2 F (36.8 C)  TempSrc: Oral  Oral Oral Oral  SpO2: 95% 94% 95% 96%  Weight:   94.3 kg   Height:        Intake/Output Summary (Last 24 hours) at 12/01/2018 1440 Last data filed at 12/01/2018 1300 Gross per 24 hour  Intake 2463.29 ml  Output 1475 ml  Net 988.29 ml   Filed Weights   11/29/18 1244 11/30/18 0500 12/01/18 0548  Weight: 88 kg 90.9 kg 94.3 kg    Examination: General exam: Awake, laying in bed, in nad Respiratory system: Normal respiratory effort, no wheezing Cardiovascular system: regular rate, s1, s2 Gastrointestinal system: Soft, nondistended, positive BS Central nervous system: CN2-12 grossly intact, strength intact Extremities: Perfused, no clubbing Skin: Normal skin turgor, no notable skin lesions seen Psychiatry: Mood normal // no visual hallucinations   Data Reviewed: I have personally reviewed following labs and imaging studies  CBC: Recent Labs  Lab 11/29/18 1255 11/30/18 0550  WBC 9.2 10.4  NEUTROABS 6.0  --   HGB 14.5 13.1  HCT 44.1 41.0  MCV 91.9 91.7  PLT 163 536*   Basic Metabolic Panel: Recent Labs  Lab 11/29/18 1255 11/30/18 0550 12/01/18 0556  NA 140 139 137  K 4.6 5.5* 4.3  CL 105 109 107  CO2 21* 20* 21*  GLUCOSE 248* 235* 104*  BUN 60* 54* 45*  CREATININE 5.92* 4.39* 2.63*  CALCIUM 8.9 8.3* 8.2*   GFR: Estimated Creatinine Clearance: 27.2 mL/min (A) (by C-G formula based on SCr of 2.63 mg/dL (H)). Liver Function Tests: Recent Labs  Lab 11/29/18 1255 11/30/18 0550  AST 22 17  ALT 23 17  ALKPHOS 85 73  BILITOT 0.6 0.8  PROT 6.5 5.7*  ALBUMIN 4.0 3.4*   No results for input(s): LIPASE, AMYLASE in the last 168 hours. No results for input(s): AMMONIA in the last 168 hours. Coagulation Profile: No results for input(s): INR, PROTIME in the last 168 hours. Cardiac Enzymes: No results for input(s): CKTOTAL, CKMB, CKMBINDEX, TROPONINI in the last 168 hours. BNP (last 3 results) No results for input(s): PROBNP in the last 8760 hours. HbA1C: Recent  Labs    11/30/18 0550  HGBA1C 7.1*   CBG: Recent Labs  Lab 11/30/18 1153 11/30/18 1648 11/30/18 2156 12/01/18 0810 12/01/18 1147  GLUCAP 192* 207* 168* 75 101*   Lipid Profile: No results for input(s): CHOL, HDL, LDLCALC, TRIG, CHOLHDL, LDLDIRECT in the last 72 hours. Thyroid Function Tests: No results for input(s): TSH, T4TOTAL, FREET4, T3FREE, THYROIDAB in the last 72 hours. Anemia Panel: No results for input(s): VITAMINB12, FOLATE, FERRITIN, TIBC, IRON, RETICCTPCT in the last 72 hours. Sepsis Labs: No results for input(s): PROCALCITON, LATICACIDVEN in the last 168 hours.  Recent Results (from the past 240 hour(s))  SARS Coronavirus 2 (CEPHEID - Performed in [place] hospital lab), Hosp Order     Status: None   Collection Time: 11/29/18  3:21 PM   Specimen: Nasopharyngeal Swab  Result Value Ref Range Status   SARS Coronavirus 2 NEGATIVE NEGATIVE Final  Comment: (NOTE) If result is NEGATIVE SARS-CoV-2 target nucleic acids are NOT DETECTED. The SARS-CoV-2 RNA is generally detectable in upper and lower  respiratory specimens during the acute phase of infection. The lowest  concentration of SARS-CoV-2 viral copies this assay can detect is 250  copies / mL. A negative result does not preclude SARS-CoV-2 infection  and should not be used as the sole basis for treatment or other  patient management decisions.  A negative result may occur with  improper specimen collection / handling, submission of specimen other  than nasopharyngeal swab, presence of viral mutation(s) within the  areas targeted by this assay, and inadequate number of viral copies  (<250 copies / mL). A negative result must be combined with clinical  observations, patient history, and epidemiological information. If result is POSITIVE SARS-CoV-2 target nucleic acids are DETECTED. The SARS-CoV-2 RNA is generally detectable in upper and lower  respiratory specimens dur ing the acute phase of infection.   Positive  results are indicative of active infection with SARS-CoV-2.  Clinical  correlation with patient history and other diagnostic information is  necessary to determine patient infection status.  Positive results do  not rule out bacterial infection or co-infection with other viruses. If result is PRESUMPTIVE POSTIVE SARS-CoV-2 nucleic acids MAY BE PRESENT.   A presumptive positive result was obtained on the submitted specimen  and confirmed on repeat testing.  While 2019 novel coronavirus  (SARS-CoV-2) nucleic acids may be present in the submitted sample  additional confirmatory testing may be necessary for epidemiological  and / or clinical management purposes  to differentiate between  SARS-CoV-2 and other Sarbecovirus currently known to infect humans.  If clinically indicated additional testing with an alternate test  methodology [phone]) is advised. The SARS-CoV-2 RNA is generally  detectable in upper and lower respiratory sp ecimens during the acute  phase of infection. The expected result is Negative. Fact Sheet for Patients:  StrictlyIdeas.no Fact Sheet for Healthcare Providers: BankingDealers.co.za This test is not yet approved or cleared by the [place] FDA and has been authorized for detection and/or diagnosis of SARS-CoV-2 by FDA under an Emergency Use Authorization (EUA).  This EUA will remain in effect (meaning this test can be used) for the duration of the COVID-19 declaration under Section 564(b)(1) of the Act, 21 U.S.C. section 360bbb-3(b)(1), unless the authorization is terminated or revoked sooner. Performed at [facility], [place] [address] [place], [place] [address]   Urine Culture     Status: Abnormal   Collection Time: 11/29/18  6:31 PM   Specimen: Urine, Cystoscope  Result Value Ref Range Status   Specimen Description   Final    CYSTOSCOPY Performed at [facility] [address], [place], [place] [address]    Special Requests   Final    NONE Performed at [facility], [place] [address], [place], [place] [address]    Culture (A)  Final    <10,000 COLONIES/mL INSIGNIFICANT GROWTH Performed at [facility] [address], [place],  [address]    Report Status 11/30/2018 FINAL  Final     Radiology Studies: Dg C-arm 1-60 Min-no Report  Result Date: 11/29/2018 Fluoroscopy was utilized by the requesting physician.  No radiographic interpretation.    Scheduled Meds: . atorvastatin  80 mg Oral QPM  . insulin aspart  0-15 Units Subcutaneous TID WC  . insulin aspart  0-5 Units Subcutaneous QHS  . isosorbide mononitrate  60 mg  Oral Daily  . nebivolol  10 mg Oral Daily   Continuous Infusions: . sodium chloride 75 mL/hr at 12/01/18 0636     LOS: 2 days   Marylu Lund, MD [facility] Pager On Amion  If 7PM-7AM, please contact night-coverage 12/01/2018, 2:40 PM

## 2018-12-01 NOTE — Progress Notes (Signed)
[facility] Progress Note  Subjective: no new c/o  Vitals:   11/30/18 1407 11/30/18 2315 12/01/18 0548 12/01/18 1347  BP: 139/71 (!) 142/63 139/75 (!) 144/66  Pulse: (!) 55 (!) 44 (!) 51 60  Resp: (!) 21 18 18 18   Temp: 97.8 F (36.6 C) 98.4 F (36.9 C) 98.9 F (37.2 C) 98.2 F (36.8 C)  TempSrc: Oral Oral Oral Oral  SpO2: 95% 94% 95% 96%  Weight:   94.3 kg   Height:        Inpatient medications: . atorvastatin  80 mg Oral QPM  . insulin aspart  0-15 Units Subcutaneous TID WC  . insulin aspart  0-5 Units Subcutaneous QHS  . isosorbide mononitrate  60 mg Oral Daily  . nebivolol  10 mg Oral Daily   . sodium chloride 75 mL/hr at 12/01/18 0636   acetaminophen **OR** acetaminophen, morphine injection, traMADol    Exam: Gen alert, elderly WM no distress No rash, cyanosis or gangrene Sclera anicteric, throat clear  No jvd or bruits Chest clear bilat to bases RRR no MRG Abd soft ntnd no mass or ascites +bs GU normal male w/ foley cath draining blood-tinged urine MS no joint effusions or deformity Ext no LE edema, no wounds or ulcers Neuro is alert, Ox 3 , nf   Assessment/ Plan: 1. Acute renal failure: due to obstruction, sp L ureteral procedure w/ relief of obstruction using laser lithotripsy.  Creat continues to improve down to 2.5 today.  No other suggestions, will sign off. 2. CKD III - baseline creat 1.5- 1.9.  3. H/o R nephrectomy d/t RCCa 4. H/o prostate Ca 5. H/o CAD w/ stenting 6. HTN - bp's stable 7. [name] 12/01/2018, 4:06 PM  Iron/TIBC/Ferritin/ %Sat No results found for: IRON, TIBC, FERRITIN, IRONPCTSAT Recent Labs  Lab 11/30/18 0550 12/01/18 0556  NA 139 137  K 5.5* 4.3  CL 109 107  CO2 20* 21*  GLUCOSE 235* 104*  BUN 54* 45*  CREATININE 4.39* 2.63*  CALCIUM 8.3* 8.2*  ALBUMIN 3.4*  --    Recent Labs  Lab 11/30/18 0550  AST 17  ALT 17  ALKPHOS 73  BILITOT 0.8  PROT 5.7*    Recent Labs  Lab 11/30/18 0550  WBC 10.4  HGB 13.1  HCT 41.0  PLT 148*

## 2018-12-02 DIAGNOSIS — N179 Acute kidney failure, unspecified: Principal | ICD-10-CM

## 2018-12-02 DIAGNOSIS — Z905 Acquired absence of kidney: Secondary | ICD-10-CM

## 2018-12-02 LAB — BASIC METABOLIC PANEL
Anion gap: 6 (ref 5–15)
BUN: 45 mg/dL — ABNORMAL HIGH (ref 8–23)
CO2: 24 mmol/L (ref 22–32)
Calcium: 8.3 mg/dL — ABNORMAL LOW (ref 8.9–10.3)
Chloride: 107 mmol/L (ref 98–111)
Creatinine, Ser: 2.1 mg/dL — ABNORMAL HIGH (ref 0.61–1.24)
GFR calc Af Amer: 34 mL/min — ABNORMAL LOW (ref >60)
GFR calc non Af Amer: 29 mL/min — ABNORMAL LOW (ref >60)
Glucose, Bld: 87 mg/dL (ref 70–99)
Potassium: 4.1 mmol/L (ref 3.5–5.1)
Sodium: 137 mmol/L (ref 135–145)

## 2018-12-02 LAB — GLUCOSE, CAPILLARY: Glucose-Capillary: 83 mg/dL (ref 70–99)

## 2018-12-02 MED ORDER — GLIMEPIRIDE 2 MG PO TABS: 2.0000 mg | ORAL_TABLET | Freq: Every day | ORAL | Status: DC

## 2018-12-02 MED ORDER — OXYBUTYNIN CHLORIDE ER 5 MG PO TB24: 5.0000 mg | ORAL_TABLET | Freq: Two times a day (BID) | ORAL | 0 refills | Status: AC | PRN

## 2018-12-02 MED ORDER — TRAMADOL HCL 50 MG PO TABS: 50.0000 mg | ORAL_TABLET | Freq: Four times a day (QID) | ORAL | 0 refills | Status: AC | PRN

## 2018-12-02 MED ORDER — TAMSULOSIN HCL 0.4 MG PO CAPS: 0.4000 mg | ORAL_CAPSULE | Freq: Every day | ORAL | 0 refills | Status: AC

## 2018-12-02 MED ORDER — PHENAZOPYRIDINE HCL 100 MG PO TABS: 100.0000 mg | ORAL_TABLET | Freq: Three times a day (TID) | ORAL | 0 refills | Status: DC | PRN

## 2018-12-02 NOTE — Plan of Care (Signed)

## 2018-12-02 NOTE — Plan of Care (Signed)
Appetite is good

## 2018-12-02 NOTE — Care Management Important Message (Signed)
Important Message  Patient Details IM Letter given to Dessa Phi RN to present to the Patient Name: [name] MRN: [id number] Date of Birth: 15-Jun-1940   Medicare Important Message Given:  Yes     Kerin Salen 12/02/2018, 10:12 AM

## 2018-12-02 NOTE — Plan of Care (Signed)
  Problem: Nutrition: Goal: Adequate nutrition will be maintained Outcome: Progressing   

## 2018-12-02 NOTE — Anesthesia Postprocedure Evaluation (Signed)
Anesthesia Post Note  Patient: [name]  Procedure(s) Performed: CYSTOSCOPY/URETEROSCOPY/HOLMIUM LASER/STENT PLACEMENTleft retrograde pylegram (Left Ureter) HOLMIUM LASER APPLICATION (Ureter)     Patient location during evaluation: PACU Anesthesia Type: General Level of consciousness: sedated and patient cooperative Pain management: pain level controlled Vital Signs Assessment: post-procedure vital signs reviewed and stable Respiratory status: spontaneous breathing Cardiovascular status: stable Anesthetic complications: no    Last Vitals:  Vitals:   12/01/18 2057 12/02/18 0437  BP: 135/69 (!) 144/68  Pulse: (!) 50 (!) 49  Resp: 20 18  Temp: 36.6 C 36.6 C  SpO2: 95% 95%    Last Pain:  Vitals:   12/02/18 0848  TempSrc:   PainSc: 0-No pain                 Nolon Nations

## 2018-12-02 NOTE — Discharge Summary (Signed)
Physician Discharge Summary  NICKEY KLOEPFER LTJ:[id number] DOB: 06/28/1940 DOA: 11/29/2018  PCP: Burnard Bunting, MD  Admit date: 11/29/2018 Discharge date: 12/02/2018  Admitted From: home Disposition:  home  Recommendations for Outpatient Follow-up:  1. Follow up with PCP in 1-2 weeks and w Dr Louis Meckel in 1 week to remove stent 2. Please obtain BMP in one week 3. Please follow up on the following pending results:  Home Health:no  Equipment/Devices:none  Discharge Condition: Stable CODE STATUS: FULL Diet recommendation: Heart Healthy, diabetic  Brief/Interim Summary:  78 y.o.malewith medical history significant ofDM2, HTN, HLD, RLE, solitary kidney who presented to the ED with complaints of L flank pain that started on the early AM of hospital admit. Pt also reported no significant urine output since onset. 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 Pt also has significant CAD hx and is s/p multiple stents, most recent was over 1 year prior to admit. Pt is normally followed by Dr. Einar Gip. Currently denies fevers, chills, sweats. Denies chest pain or sob.  ED Course:In the ED, patient was noted to have Cr of 5.92 (baseline around 1.6-1.7). No leukocytosis.Hgb of 14.5. Pt underwent renal stone CT with findings of 7mm distal L ureteral calculus resulting in mild L hydronephrosis and hydroureter. Urology was consulted and pt was planned for cystourethroscopy. Hospitalist consulted for consideration for medical admission.  Patient was admitted seen by nephrology, urology. Underwent catheterization/19, subsequently Foley discontinued urology signed off.  Creatinine nicely improved to 2.1 from admission value of 5.9 baseline around 1.7. Advised to follow-up with PCP to monitor chemistry panel in a week and continue to hold nephrotoxic medication follow-up with PCP to resume.  Issues adderssed:  1. QTM:AUQJFHLK Cr of 1.6-1.7 1. Presenting Cr at  5.92 2. Likely secondary to obstrucive disease from renal calculi in setting of solitary kidney 3. Urology following, s/p lithotripsy 7/20 4. No urine output prior to admit 5. Have been continued on IVF hydration 6. Continue to avoid nephrotoxic agents 7. Nephrology following- signed off 7/21 8. Cr now trending down, currently 2.63-->2.1 2. HTN 1. BP currently stable 2. Have continued on BP meds with exception of lisinopril-HCTZ given ARF  3. HLD 1. Continue lipitor as tolerated per home meds 4. CAD 1. Remains without chest pain 2. Pt has hx of multiple stents, last was over 32yr prior to admit 3. Most recent 2d echo from 2018 demonstrated EF 50-55% with akinesis of anterolateral myocardium and hypokinesis of mid-distal anterolateral myocardium 4. Followed by Dr. Einar Gip as outpatient 5. Hx solitary kidney 1. Secondary to nephrectomy following diagnosis or R renal cell cancer 2. Creatinine significantly improved-nephrology signed off. 6. Obstructing renal calculi 1. Urology following, s/p lithotripsy 7/19 2. Urology has since signed off 3. Pt reports pain around foley site.  Off Foley and voiding well.  Some pain at the stent site and he is being discharged on extended release medication with Pyridium Flomax, anticholinergics and tramadol as needed 7. DM2 with hyperlipidemia 1. Most recent a1c from 2018 of 7.5 2. Repeat A1c of 7.1 3. Patient to continue on half of his glimepiride but if sugar starts to get uncontrolled can resume the full dose otherwise follow-up with PCP to resume the full dose.  DVT prophylaxis: SCD's Code Status: DNR Family Communication: Pt in room, family not at bedside Disposition Plan:  Discharged home today   Consultants:   Urology  Nephrology  Procedures:   Lithotripsy 7/19   Discharge Diagnoses:  Principal Problem:   ARF (acute renal failure) (HCC) Active Problems:   Hypertension   Chronic kidney disease   Hyperlipidemia   CAD (coronary  artery disease), native coronary artery   Single kidney   Urinary tract obstruction by kidney stone   Type 2 diabetes mellitus with hyperlipidemia [facility])    Discharge Instructions  Discharge Instructions    Diet - low sodium heart healthy   Complete by: As directed    Discharge instructions   Complete by: As directed    Please call call MD or return to ER for similar or worsening recurring problem that brought you to hospital or if any fever,nausea/vomiting,abdominal pain, uncontrolled pain, chest pain,  shortness of breath or any other alarming symptoms.  Please repeat chemistry panel for renal function in 5 to 7 days- and discuss to your doctor about resuming some of the medication stopped in hospital  Check blood sugar 4 times a day: And talk to your primary care doctor about resuming full dose of Glimepride.  Please follow-up your doctor as instructed in a week time and call the office for appointment.  Please avoid alcohol, smoking, or any other illicit substance and maintain healthy habits including taking your regular medications as prescribed.  You were cared for by a hospitalist during your hospital stay. If you have any questions about your discharge medications or the care you received while you were in the hospital after you are discharged, you can call the unit and ask to speak with the hospitalist on call if the hospitalist that took care of you is not available.  Once you are discharged, your primary care physician will handle any further medical issues. Please note that NO REFILLS for any discharge medications will be authorized once you are discharged, as it is imperative that you return to your primary care physician (or establish a relationship with a primary care physician if you do not have one) for your aftercare needs so that they can reassess your need for medications and monitor your lab values   Increase activity slowly   Complete by: As directed      Allergies  as of 12/02/2018      Reactions   Sulfa Antibiotics Hives   Morphine And Related Other (See Comments)   Spontaneous body movements   Lisinopril Cough      Medication List    STOP taking these medications   amLODipine 5 MG tablet Commonly known as: NORVASC   glyBURIDE 2.5 MG tablet Commonly known as: DIABETA   lisinopril-hydrochlorothiazide 20-12.5 MG tablet Commonly known as: ZESTORETIC   olmesartan-hydrochlorothiazide 20-12.5 MG tablet Commonly known as: BENICAR HCT   rosuvastatin 10 MG tablet Commonly known as: CRESTOR     TAKE these medications   acetaminophen 500 MG tablet Commonly known as: TYLENOL Take 1,000 mg by mouth every 6 (six) hours as needed for moderate pain.   aspirin EC 81 MG tablet Take 81 mg by mouth daily.   atorvastatin 80 MG tablet Commonly known as: LIPITOR Take 80 mg by mouth every evening.   Bystolic 20 MG Tabs Generic drug: Nebivolol HCl Take 10 mg by mouth daily. daily   glimepiride 2 MG tablet Commonly known as: AMARYL Take 1 tablet (2 mg total) by mouth daily with breakfast. Reduce the dose to half until  resumed by your primary care doctor What changed: additional instructions   isosorbide mononitrate 60 MG 24 hr tablet Commonly known as: IMDUR Take 1 tablet (60 mg total) by  mouth daily.   latanoprost 0.005 % ophthalmic solution Commonly known as: XALATAN Place 1 drop into both eyes at bedtime.   nitroGLYCERIN 0.4 MG SL tablet Commonly known as: NITROSTAT Place 1 tablet (0.4 mg total) under the tongue every 5 (five) minutes x 3 doses as needed for chest pain.   oxybutynin 5 MG 24 hr tablet Commonly known as: Ditropan XL Take 1 tablet (5 mg total) by mouth 2 (two) times daily as needed for up to 5 days.   phenazopyridine 100 MG tablet Commonly known as: PYRIDIUM Take 1 tablet (100 mg total) by mouth 3 (three) times daily as needed for pain.   tamsulosin 0.4 MG Caps capsule Commonly known as: Flomax Take 1 capsule (0.4  mg total) by mouth at bedtime for 14 doses.   traMADol 50 MG tablet Commonly known as: ULTRAM Take 1 tablet (50 mg total) by mouth every 6 (six) hours as needed for up to 3 days for severe pain.   VISINE OP Place 1 drop into both eyes daily as needed (dry eyes).      Follow-up Information    Ardis Hughs, MD In 1 week.   Specialty: Urology Why: Stent removal Contact information: [place] [address] [phone]        Burnard Bunting, MD Follow up in 1 week(s).   Specialty: Internal Medicine Why: for BMP chekc and to d/w about resuming home meds. Contact information: [address] [place] [address] [phone]          Allergies  Allergen Reactions  . Sulfa Antibiotics Hives  . Morphine And Related Other (See Comments)    Spontaneous body movements  . Lisinopril Cough    Procedures/Studies: Dg C-arm 1-60 Min-no Report  Result Date: 11/29/2018 Fluoroscopy was utilized by the requesting physician.  No radiographic interpretation.   Ct Renal Stone Study  Result Date: 11/29/2018 CLINICAL DATA:  Blank pain and abdominal pain since yesterday. Hematuria. History of prostate cancer and right nephrectomy. History of nephrolithiasis greater than 40 years ago. EXAM: CT ABDOMEN AND PELVIS WITHOUT CONTRAST TECHNIQUE: Multidetector CT imaging of the abdomen and pelvis was performed following the standard protocol without IV contrast. COMPARISON:  PET-CT dated 02/19/2018. FINDINGS: Lower chest: Right coronary artery stent. Mildly enlarged heart. Clear lung bases. Hepatobiliary: No focal liver abnormality is seen. No gallstones, gallbladder wall thickening, or biliary dilatation. Pancreas: Unremarkable. No pancreatic ductal dilatation or surrounding inflammatory changes. Spleen: Normal in size without focal abnormality. Adrenals/Urinary Tract: Stable post nephrectomy changes on the right. Normal appearing adrenal glands. Interval mild dilatation of the  left renal collecting system and ureter to the level of a 5 mm calculus in the distal ureter, just proximal to the ureterovesical junction. Mild left perinephric and periureteric soft tissue stranding. This represents the previously demonstrated lower pole left renal calculus, no longer seen in the kidney. No significant urine in the bladder. No bladder calculi seen Stomach/Bowel: Multiple colonic diverticula without evidence of diverticulitis. Unremarkable stomach, small bowel and appendix. Vascular/Lymphatic: Atheromatous arterial calcifications without aneurysm. No enlarged lymph nodes. Reproductive: Prostate radiation seed implants. Other: Small bilateral inguinal hernias containing fat. Small umbilical hernia containing fat. Musculoskeletal: Lumbar and lower thoracic spine degenerative changes. No evidence of bony metastatic disease. IMPRESSION: 1. 5 mm distal left ureteral calculus causing mild left hydronephrosis and hydroureter. 2. Colonic diverticulosis. Electronically Signed   By: Claudie Revering M.D.   On: 11/29/2018 14:34   (Echo, Carotid, EGD, Colonoscopy, ERCP)  Subjective: Resting well, no complaints. Voiding, uop 1675/past 24 hrs   Discharge Exam: Vitals:   12/01/18 2057 12/02/18 0437  BP: 135/69 (!) 144/68  Pulse: (!) 50 (!) 49  Resp: 20 18  Temp: 97.9 F (36.6 C) 97.9 F (36.6 C)  SpO2: 95% 95%   Vitals:   12/01/18 0548 12/01/18 1347 12/01/18 2057 12/02/18 0437  BP: 139/75 (!) 144/66 135/69 (!) 144/68  Pulse: (!) 51 60 (!) 50 (!) 49  Resp: 18 18 20 18   Temp: 98.9 F (37.2 C) 98.2 F (36.8 C) 97.9 F (36.6 C) 97.9 F (36.6 C)  TempSrc: Oral Oral Oral Oral  SpO2: 95% 96% 95% 95%  Weight: 94.3 kg     Height:        General: Pt is alert, awake, not in acute distress Cardiovascular: RRR, S1/S2 +, no rubs, no gallops Respiratory: CTA bilaterally, no wheezing, no rhonchi Abdominal: Soft, NT, ND, bowel sounds + Extremities: no edema, no cyanosis   The results of  significant diagnostics from this hospitalization (including imaging, microbiology, ancillary and laboratory) are listed below for reference.     Microbiology: Recent Results (from the past 240 hour(s))  SARS Coronavirus 2 (CEPHEID - Performed in [place] hospital lab), Hosp Order     Status: None   Collection Time: 11/29/18  3:21 PM   Specimen: Nasopharyngeal Swab  Result Value Ref Range Status   SARS Coronavirus 2 NEGATIVE NEGATIVE Final    Comment: (NOTE) If result is NEGATIVE SARS-CoV-2 target nucleic acids are NOT DETECTED. The SARS-CoV-2 RNA is generally detectable in upper and lower  respiratory specimens during the acute phase of infection. The lowest  concentration of SARS-CoV-2 viral copies this assay can detect is 250  copies / mL. A negative result does not preclude SARS-CoV-2 infection  and should not be used as the sole basis for treatment or other  patient management decisions.  A negative result may occur with  improper specimen collection / handling, submission of specimen other  than nasopharyngeal swab, presence of viral mutation(s) within the  areas targeted by this assay, and inadequate number of viral copies  (<250 copies / mL). A negative result must be combined with clinical  observations, patient history, and epidemiological information. If result is POSITIVE SARS-CoV-2 target nucleic acids are DETECTED. The SARS-CoV-2 RNA is generally detectable in upper and lower  respiratory specimens dur ing the acute phase of infection.  Positive  results are indicative of active infection with SARS-CoV-2.  Clinical  correlation with patient history and other diagnostic information is  necessary to determine patient infection status.  Positive results do  not rule out bacterial infection or co-infection with other viruses. If result is PRESUMPTIVE POSTIVE SARS-CoV-2 nucleic acids MAY BE PRESENT.   A presumptive positive result was obtained on the submitted specimen   and confirmed on repeat testing.  While 2019 novel coronavirus  (SARS-CoV-2) nucleic acids may be present in the submitted sample  additional confirmatory testing may be necessary for epidemiological  and / or clinical management purposes  to differentiate between  SARS-CoV-2 and other Sarbecovirus currently known to infect humans.  If clinically indicated additional testing with an alternate test  methodology [phone]) is advised. The SARS-CoV-2 RNA is generally  detectable in upper and lower respiratory sp ecimens during the acute  phase of infection. The expected result is Negative. Fact Sheet for Patients:  StrictlyIdeas.no Fact Sheet for Healthcare Providers: BankingDealers.co.za This test is not yet approved or cleared by  the [employer] and has been authorized for detection and/or diagnosis of SARS-CoV-2 by FDA under an Emergency Use Authorization (EUA).  This EUA will remain in effect (meaning this test can be used) for the duration of the COVID-19 declaration under Section 564(b)(1) of the Act, 21 U.S.C. section 360bbb-3(b)(1), unless the authorization is terminated or revoked sooner. Performed at [facility], [place] [address], [place], [place] [address]   Urine Culture     Status: Abnormal   Collection Time: 11/29/18  6:31 PM   Specimen: Urine, Cystoscope  Result Value Ref Range Status   Specimen Description   Final    CYSTOSCOPY Performed at [facility] [address], [place], [place] [address]    Special Requests   Final    NONE Performed at [facility], [place] [address], [place], [place] [address]    Culture (A)  Final    <10,000 COLONIES/mL INSIGNIFICANT GROWTH Performed at [facility] [address], [place], [place] [address]    Report Status 11/30/2018 FINAL  Final     Labs: BNP (last 3 results) No results for input(s): BNP in the last  8760 hours. Basic Metabolic Panel: Recent Labs  Lab 11/29/18 1255 11/30/18 0550 12/01/18 0556 12/02/18 0528  NA 140 139 137 137  K 4.6 5.5* 4.3 4.1  CL 105 109 107 107  CO2 21* 20* 21* 24  GLUCOSE 248* 235* 104* 87  BUN 60* 54* 45* 45*  CREATININE 5.92* 4.39* 2.63* 2.10*  CALCIUM 8.9 8.3* 8.2* 8.3*   Liver Function Tests: Recent Labs  Lab 11/29/18 1255 11/30/18 0550  AST 22 17  ALT 23 17  ALKPHOS 85 73  BILITOT 0.6 0.8  PROT 6.5 5.7*  ALBUMIN 4.0 3.4*   No results for input(s): LIPASE, AMYLASE in the last 168 hours. No results for input(s): AMMONIA in the last 168 hours. CBC: Recent Labs  Lab 11/29/18 1255 11/30/18 0550  WBC 9.2 10.4  NEUTROABS 6.0  --   HGB 14.5 13.1  HCT 44.1 41.0  MCV 91.9 91.7  PLT 163 148*   Cardiac Enzymes: No results for input(s): CKTOTAL, CKMB, CKMBINDEX, TROPONINI in the last 168 hours. BNP: Invalid input(s): POCBNP CBG: Recent Labs  Lab 12/01/18 0810 12/01/18 1147 12/01/18 1651 12/01/18 2100 12/02/18 0745  GLUCAP 75 101* 156* 88 83   D-Dimer No results for input(s): DDIMER in the last 72 hours. Hgb A1c Recent Labs    11/30/18 0550  HGBA1C 7.1*   Lipid Profile No results for input(s): CHOL, HDL, LDLCALC, TRIG, CHOLHDL, LDLDIRECT in the last 72 hours. Thyroid function studies No results for input(s): TSH, T4TOTAL, T3FREE, THYROIDAB in the last 72 hours.  Invalid input(s): FREET3 Anemia work up No results for input(s): VITAMINB12, FOLATE, FERRITIN, TIBC, IRON, RETICCTPCT in the last 72 hours. Urinalysis    Component Value Date/Time   COLORURINE STRAW (A) 11/29/2018 1413   APPEARANCEUR CLEAR 11/29/2018 1413   LABSPEC <1.005 (L) 11/29/2018 1413   PHURINE 5.0 11/29/2018 1413   GLUCOSEU NEGATIVE 11/29/2018 1413   HGBUR LARGE (A) 11/29/2018 1413   BILIRUBINUR NEGATIVE 11/29/2018 1413   KETONESUR NEGATIVE 11/29/2018 1413   PROTEINUR NEGATIVE 11/29/2018 1413   NITRITE NEGATIVE 11/29/2018 1413   LEUKOCYTESUR  NEGATIVE 11/29/2018 1413   Sepsis Labs Invalid input(s): PROCALCITONIN,  WBC,  LACTICIDVEN Microbiology Recent Results (from the past 240 hour(s))  SARS Coronavirus 2 (CEPHEID - Performed in [place] hospital lab), [facility]  Status: None   Collection Time: 11/29/18  3:21 PM   Specimen: Nasopharyngeal Swab  Result Value Ref Range Status   SARS Coronavirus 2 NEGATIVE NEGATIVE Final    Comment: (NOTE) If result is NEGATIVE SARS-CoV-2 target nucleic acids are NOT DETECTED. The SARS-CoV-2 RNA is generally detectable in upper and lower  respiratory specimens during the acute phase of infection. The lowest  concentration of SARS-CoV-2 viral copies this assay can detect is 250  copies / mL. A negative result does not preclude SARS-CoV-2 infection  and should not be used as the sole basis for treatment or other  patient management decisions.  A negative result may occur with  improper specimen collection / handling, submission of specimen other  than nasopharyngeal swab, presence of viral mutation(s) within the  areas targeted by this assay, and inadequate number of viral copies  (<250 copies / mL). A negative result must be combined with clinical  observations, patient history, and epidemiological information. If result is POSITIVE SARS-CoV-2 target nucleic acids are DETECTED. The SARS-CoV-2 RNA is generally detectable in upper and lower  respiratory specimens dur ing the acute phase of infection.  Positive  results are indicative of active infection with SARS-CoV-2.  Clinical  correlation with patient history and other diagnostic information is  necessary to determine patient infection status.  Positive results do  not rule out bacterial infection or co-infection with other viruses. If result is PRESUMPTIVE POSTIVE SARS-CoV-2 nucleic acids MAY BE PRESENT.   A presumptive positive result was obtained on the submitted specimen  and confirmed on repeat testing.  While 2019 novel  coronavirus  (SARS-CoV-2) nucleic acids may be present in the submitted sample  additional confirmatory testing may be necessary for epidemiological  and / or clinical management purposes  to differentiate between  SARS-CoV-2 and other Sarbecovirus currently known to infect humans.  If clinically indicated additional testing with an alternate test  methodology [phone]) is advised. The SARS-CoV-2 RNA is generally  detectable in upper and lower respiratory sp ecimens during the acute  phase of infection. The expected result is Negative. Fact Sheet for Patients:  StrictlyIdeas.no Fact Sheet for Healthcare Providers: BankingDealers.co.za This test is not yet approved or cleared by the [place] FDA and has been authorized for detection and/or diagnosis of SARS-CoV-2 by FDA under an Emergency Use Authorization (EUA).  This EUA will remain in effect (meaning this test can be used) for the duration of the COVID-19 declaration under Section 564(b)(1) of the Act, 21 U.S.C. section 360bbb-3(b)(1), unless the authorization is terminated or revoked sooner. Performed at [facility], [place] [address], [place], [place] [address]   Urine Culture     Status: Abnormal   Collection Time: 11/29/18  6:31 PM   Specimen: Urine, Cystoscope  Result Value Ref Range Status   Specimen Description   Final    CYSTOSCOPY Performed at [facility] [address] [place], [place] [address]    Special Requests   Final    NONE Performed at [facility], [place] [address] [place], [place] [address]    Culture (A)  Final    <10,000 COLONIES/mL INSIGNIFICANT GROWTH Performed at [facility] [address] [place],  [address]    Report Status 11/30/2018 FINAL  Final     Time coordinating discharge: 25 minutes  SIGNED:   Antonieta Pert, MD  [facility] 12/02/2018, 10:13 AM  If  7PM-7AM, please contact night-coverage [email or web address]

## 2018-12-14 ENCOUNTER — Ambulatory Visit: Payer: Medicare Other | Admitting: Cardiology

## 2018-12-21 ENCOUNTER — Other Ambulatory Visit: Payer: Self-pay | Admitting: Cardiology

## 2019-01-20 ENCOUNTER — Other Ambulatory Visit: Payer: Self-pay

## 2019-01-20 MED ORDER — AMLODIPINE BESYLATE 5 MG PO TABS: ORAL_TABLET | ORAL | 1 refills | Status: DC

## 2019-01-20 MED ORDER — ISOSORBIDE MONONITRATE ER 60 MG PO TB24: 60.0000 mg | ORAL_TABLET | Freq: Every day | ORAL | 1 refills | Status: DC

## 2019-02-05 LAB — LIPID PANEL WITH LDL/HDL RATIO
Cholesterol, Total: 134 mg/dL (ref 100–199)
HDL: 44 mg/dL (ref >39)
LDL Chol Calc (NIH): 66 mg/dL (ref 0–99)
LDL/HDL Ratio: 1.5 ratio (ref 0.0–3.6)
Triglycerides: 140 mg/dL (ref 0–149)
VLDL Cholesterol Cal: 24 mg/dL (ref 5–40)

## 2019-03-24 ENCOUNTER — Other Ambulatory Visit: Payer: Self-pay

## 2019-03-24 MED ORDER — ROSUVASTATIN CALCIUM 10 MG PO TABS: 10.0000 mg | ORAL_TABLET | Freq: Every day | ORAL | 1 refills | Status: DC

## 2019-03-25 ENCOUNTER — Telehealth: Payer: Self-pay

## 2019-03-25 NOTE — Telephone Encounter (Signed)
Pt called asking for a refill on his Olmesartan-Hctz. Pre Dr. Einar Gip we all not be able to refill it due that pt pcp prescribe it to him and he has bad kidneys.  Called pt to inform him, pt mention Dr. Einar Gip prescribe it and was a little upset about it.

## 2019-03-26 ENCOUNTER — Other Ambulatory Visit: Payer: Self-pay | Admitting: Cardiology

## 2019-03-26 DIAGNOSIS — I10 Essential (primary) hypertension: Secondary | ICD-10-CM

## 2019-03-26 MED ORDER — OLMESARTAN-AMLODIPINE-HCTZ 20-5-12.5 MG PO TABS: 1.0000 | ORAL_TABLET | Freq: Every day | ORAL | 1 refills | Status: DC

## 2019-03-26 NOTE — Progress Notes (Signed)
Patient came in for a refill request, patient was in acute renal failure sometime in August 2020, but has restarted back Benicar HCT that he was previously on for hypertension.  I do not have his recent labs, but he insists that labs have been normalized and he is back on the medication and has started this 7 days after his hospitalization.  I have discussed extensively with the patient that Benicar can cause worsening renal function, patient is aware of this and states that she has been closely monitored by his PCP.  Hence I refilled the prescription, will combine amlodipine 5 mg that he was already taking along with Benicar HCT and to 1 tablet.  He has an appointment to see me in 2 months.  I will forward this note to Dr. Nada Libman.    ICD-10-CM   1. Essential hypertension  I10 Olmesartan-amLODIPine-HCTZ 20-5-12.5 MG TABS    Discontinued Amlodipine 5 mg and added to above. Resumed him medications.  CC: [name]

## 2019-05-21 ENCOUNTER — Ambulatory Visit: Payer: Medicare Other | Admitting: Cardiology

## 2019-06-05 ENCOUNTER — Ambulatory Visit: Payer: Medicare Other | Attending: Internal Medicine

## 2019-06-05 DIAGNOSIS — Z23 Encounter for immunization: Secondary | ICD-10-CM

## 2019-06-05 NOTE — Progress Notes (Signed)
   Covid-19 Vaccination Clinic  Name:  [name]    MRN: [id number] DOB: 1940-07-06  06/05/2019  Mr. [name] was observed post Covid-19 immunization for 15 minutes without incidence. He was provided with Vaccine Information Sheet and instruction to access the V-Safe system.   Mr. [name] was instructed to call 911 with any severe reactions post vaccine: Marland Kitchen Difficulty breathing  . Swelling of your face and throat  . A fast heartbeat  . A bad rash all over your body  . Dizziness and weakness    Immunizations Administered    Name Date Dose VIS Date Route   Pfizer COVID-19 Vaccine 06/05/2019 12:02 PM 0.3 mL 04/23/2019 Intramuscular   Manufacturer: [place]   Lot: [id number]   [place]: [id number]

## 2019-06-21 ENCOUNTER — Ambulatory Visit (INDEPENDENT_AMBULATORY_CARE_PROVIDER_SITE_OTHER): Payer: Medicare Other | Admitting: Cardiology

## 2019-06-21 ENCOUNTER — Other Ambulatory Visit: Payer: Self-pay

## 2019-06-21 ENCOUNTER — Encounter: Payer: Self-pay | Admitting: Cardiology

## 2019-06-21 VITALS — BP 159/72 | HR 58 | Temp 98.0°F | Ht 72.0 in | Wt 204.0 lb

## 2019-06-21 DIAGNOSIS — I10 Essential (primary) hypertension: Secondary | ICD-10-CM

## 2019-06-21 DIAGNOSIS — E782 Mixed hyperlipidemia: Secondary | ICD-10-CM

## 2019-06-21 DIAGNOSIS — I25118 Atherosclerotic heart disease of native coronary artery with other forms of angina pectoris: Secondary | ICD-10-CM | POA: Diagnosis not present

## 2019-06-21 MED ORDER — ISOSORBIDE MONONITRATE ER 60 MG PO TB24: 60.0000 mg | ORAL_TABLET | Freq: Every day | ORAL | 3 refills | Status: DC

## 2019-06-21 MED ORDER — ROSUVASTATIN CALCIUM 20 MG PO TABS: 10.0000 mg | ORAL_TABLET | Freq: Every day | ORAL | 3 refills | Status: DC

## 2019-06-21 MED ORDER — ROSUVASTATIN CALCIUM 20 MG PO TABS: 20.0000 mg | ORAL_TABLET | Freq: Every day | ORAL | 3 refills | Status: DC

## 2019-06-21 MED ORDER — OLMESARTAN-AMLODIPINE-HCTZ 20-5-12.5 MG PO TABS: 1.0000 | ORAL_TABLET | Freq: Every day | ORAL | 3 refills | Status: DC

## 2019-06-21 MED ORDER — NITROGLYCERIN 0.4 MG SL SUBL: 0.4000 mg | SUBLINGUAL_TABLET | SUBLINGUAL | 1 refills | Status: DC | PRN

## 2019-06-21 NOTE — Progress Notes (Signed)
Primary Physician/Referring:  Burnard Bunting, MD  Patient ID: [name], male    DOB: [birth year], 79 y.o.   MRN: [id number]  Chief Complaint  Patient presents with  . Coronary Artery Disease  . Hyperlipidemia  . Follow-up    61mo  HPI:    [name] is a 79y.o. Caucasian male patient  with h/o NSTEMI in 09/2016 and underwent complex bifurcating the LAD and diagonal vessels with implantation of 4 drug-eluting stents, PCI to RCA stenosis on 10/22/2016. 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 Presently asymptomatic and requests refills.  Has not used S/L NTG recently.   Past Medical History:  Diagnosis Date  . Arthritis    "little in my right hand; some in my right big toe" (10/22/2016)  . Borderline diabetes    "tx'd w/RX; blood surgars dropped too low; dr. dc'd it 09/25/2016; blood sugars fine since then" (10/22/2016)  . Chronic kidney disease    S/P right nephrectomy in 1984  . History of gout 04/2016; 09/2016   RLE; RLE  . History of kidney stones   . Hyperlipidemia   . Hypertension   . MGUS (monoclonal gammopathy of unknown significance)   . Migraine    "none in years" (10/22/2016)  . Myeloma ([place]    "dormant for the last 2-3 years" (10/22/2016)  . NSTEMI (non-ST elevated myocardial infarction) ([place] 09/25/2016  . [facility]ocytoma 1993   [facility]  . [place]state cancer ([place] 2006   s/p brachytherapy  . Seborrheic keratosis   . 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   history of nephrectomy in 1984   Past Surgical History:  Procedure Laterality Date  . CARDIAC CATHETERIZATION  1984  . CORONARY ANGIOPLASTY WITH STENT PLACEMENT  09/26/2016; 10/22/2016   "4 stents; 2 stents"  . CORONARY STENT INTERVENTION Right 09/26/2016    Procedure: Coronary Stent Intervention;  Surgeon: GAdrian Prows MD;  [facility];  Service: Cardiovascular;  Laterality: Right;  . CORONARY STENT INTERVENTION N/A 10/22/2016   Procedure: Coronary Stent Intervention;  Surgeon: GAdrian Prows MD;  [facility];  Service: Cardiovascular;  Laterality: N/A;  . CYSTOSCOPY/URETEROSCOPY/HOLMIUM LASER/STENT PLACEMENT Left 11/29/2018   Procedure: CYSTOSCOPY/URETEROSCOPY/HOLMIUM LASER/STENT PLACEMENTleft retrograde pylegram;  Surgeon: HArdis Hughs MD;  [facility];  Service: Urology;  Laterality: Left;  . HOLMIUM LASER APPLICATION  75/85/2778  Procedure: HOLMIUM LASER APPLICATION;  Surgeon: HArdis Hughs MD;  [facility]: [facility];  Service: Urology;;  . [place] 2009?  .Marland KitchenLEFT HEART CATH AND CORONARY ANGIOGRAPHY N/A 09/26/2016   Procedure: Left Heart Cath and Coronary Angiography;  Surgeon: GAdrian Prows MD;  [facility];  Service: Cardiovascular;  Laterality: N/A;  . NEPHRECTOMY Right 1984   [facility]  . REPAIR KNEE LIGAMENT  04/2016   "tore all the ligaments; had to put 3 pins in to reattach; Dr. GGladstone Lighter  . TONSILLECTOMY     Social History   Tobacco Use  . Smoking status: Never Smoker  . Smokeless tobacco: Never Used  Substance Use Topics  . Alcohol use: Yes    Comment: very little    ROS  Review of Systems  Cardiovascular: Positive for chest pain (stable angina). Negative for dyspnea on exertion and leg swelling.  Gastrointestinal:  Negative for melena.   Objective  Blood pressure (!) 159/72, pulse (!) 58, temperature 98 F (36.7 C), height 6' (1.829 m), weight 204 lb (92.5 kg), SpO2 97 %.  Vitals with BMI 06/21/2019 12/02/2018 12/01/2018  Height '6\' 0"'  - -  Weight 204 lbs - -  BMI 27.07 - -  Systolic 867 544 920  Diastolic 72 68 69  Pulse 58 49 50     Physical Exam  Neck: No thyromegaly present.  Cardiovascular: Normal rate, regular rhythm, normal heart sounds  and intact distal pulses. Exam reveals no gallop.  No murmur heard. No leg edema, no JVD.  Pulmonary/Chest: Effort normal and breath sounds normal.  Abdominal: Soft. Bowel sounds are normal.  Musculoskeletal:     Cervical back: Neck supple.  Skin: Skin is warm and dry.   Laboratory examination:   Recent Labs    11/30/18 0550 12/01/18 0556 12/02/18 0528  NA 139 137 137  K 5.5* 4.3 4.1  CL 109 107 107  CO2 20* 21* 24  GLUCOSE 235* 104* 87  BUN 54* 45* 45*  CREATININE 4.39* 2.63* 2.10*  CALCIUM 8.3* 8.2* 8.3*  GFRNONAA 12* 22* 29*  GFRAA 14* 26* 34*   CrCl cannot be calculated (Patient's most recent lab result is older than the maximum 21 days allowed.).  CMP Latest Ref Rng & Units 12/02/2018 12/01/2018 11/30/2018  Glucose 70 - 99 mg/dL 87 104(H) 235(H)  BUN 8 - 23 mg/dL 45(H) 45(H) 54(H)  Creatinine 0.61 - 1.24 mg/dL 2.10(H) 2.63(H) 4.39(H)  Sodium 135 - 145 mmol/L 137 137 139  Potassium 3.5 - 5.1 mmol/L 4.1 4.3 5.5(H)  Chloride 98 - 111 mmol/L 107 107 109  CO2 22 - 32 mmol/L 24 21(L) 20(L)  Calcium 8.9 - 10.3 mg/dL 8.3(L) 8.2(L) 8.3(L)  Total Protein 6.5 - 8.1 g/dL - - 5.7(L)  Total Bilirubin 0.3 - 1.2 mg/dL - - 0.8  Alkaline Phos 38 - 126 U/L - - 73  AST 15 - 41 U/L - - 17  ALT 0 - 44 U/L - - 17   CBC Latest Ref Rng & Units 11/30/2018 11/29/2018 10/23/2016  WBC 4.0 - 10.5 K/uL 10.4 9.2 7.0  Hemoglobin 13.0 - 17.0 g/dL 13.1 14.5 11.3(L)  Hematocrit 39.0 - 52.0 % 41.0 44.1 35.2(L)  Platelets 150 - 400 K/uL 148(L) 163 153   Lipid Panel     Component Value Date/Time   CHOL 134 02/04/2019 0815   TRIG 140 02/04/2019 0815   HDL 44 02/04/2019 0815   CHOLHDL 4.9 09/25/2016 1243   VLDL 47 (H) 09/25/2016 1243   LDLCALC 66 02/04/2019 0815   HEMOGLOBIN A1C Lab Results  Component Value Date   HGBA1C 7.1 (H) 11/30/2018   MPG 157.07 11/30/2018   TSH No results for input(s): TSH in the last 8760 hours.  External labs 02/04/2019:  Cholesterol, total 134.000  02/04/2019 HDL 44.000 02/04/2019 LDL 91.000 mg 02/12/2018 Triglycerides 140.000 02/04/2019  A1C 6.500 % 02/16/2019  Hemoglobin 14.000 g/ 02/16/2019 INR 1.000 10/17/2016 Platelets 148.000 11/30/2018   Creatinine, Serum 1.900 mg/ 02/16/2019 Potassium 4.100 12/02/2018  ALT (SGPT) 23.000 uni 02/16/2019 TSH 2.030 02/16/2019  Labs 02/11/2018: Serum Glucose 91 Mg, BUN 36, Creatinine 2.1, EGFR 30 ML, Potassium 4.1, CMP Otherwise Normal.  HB 13.6/HCT 43.9, Platelets 190, Normal Indicis.  Uric Acid Elevated at 9.6.  TSH Normal, PSA Normal.  Total Cholesterol 163, Triglycerides 163, HDL 39, LDL 91.  Non-HDL Cholesterol 124.  A1c 6.6%.  Medications and allergies  Allergies  Allergen Reactions  . Sulfa Antibiotics Hives  . Morphine And Related Other (See Comments)    Spontaneous body movements  . Lisinopril Cough     Current Outpatient Medications  Medication Instructions  . acetaminophen (TYLENOL) 1,000 mg, Oral, Every 6 hours PRN  . aspirin EC 81 mg, Oral, Daily  . Bystolic 10 mg, Oral, Daily, daily  . colchicine 0.6 mg, Oral, As needed  . glimepiride (AMARYL) 2 mg, Oral, Daily with breakfast, Reduce the dose to half until  resumed by your primary care doctor  . isosorbide mononitrate (IMDUR) 60 mg, Oral, Daily  . neomycin-polymyxin-dexamethasone (MAXITROL) 0.1 % ophthalmic suspension Daily at bedtime  . nitroGLYCERIN (NITROSTAT) 0.4 mg, Sublingual, Every 5 min x3 PRN  . Olmesartan-amLODIPine-HCTZ 20-5-12.5 MG TABS 1 tablet, Oral, Daily  . rosuvastatin (CRESTOR) 20 mg, Oral, Daily at bedtime    Radiology:  No results found.  Cardiac Studies:   Coronary angiogram 10/22/2016: stenting distal RCA with 3.0 x 18 and ostial RCA with 4.0 x 18 mm Promus premier DES.  History of PTCA 09/26/16:  LAD and large D2 subtotally occluded S/P crush stenting of the bifurcating LAD & D1 with 2.0 x 26 mm resolute onyx distally and 3.0 x 26 mm resolute onyx proximally into the large  D1 extending into the mid LAD  crushing the mid LAD 2.5 x 18 mm resolute onyx stent distal to the D1 origin, Proximal LAD stented with an additional 3.0 x 15 mm resolute onyx stent.  Echocardiogram 06/10/2017: Left ventricle cavity is normal in size. Mild concentric hypertrophy of the left ventricle. Normal global wall motion. Doppler evidence of grade I (impaired) diastolic dysfunction, normal LAP. Calculated EF 56%. Left atrial cavity is mildly dilated. Structurally normal mitral valve with mild (Grade I) regurgitation. IVC is dilated with respiratory variation. This may suggest elevated right atrial pressure.  Assessment     ICD-10-CM   1. Essential hypertension  I10 EKG 12-Lead    Olmesartan-amLODIPine-HCTZ 20-5-12.5 MG TABS  2. Coronary artery disease of native artery of native heart with stable angina pectoris (HCC)  I25.118 nitroGLYCERIN (NITROSTAT) 0.4 MG SL tablet    isosorbide mononitrate (IMDUR) 60 MG 24 hr tablet  3. Dyspnea on exertion  R06.00   4. Mixed hyperlipidemia  E78.2 Lipid Panel With LDL/HDL Ratio    Lipid Panel With LDL/HDL Ratio    EKG 06/21/2019: Sinus rhythm with first-degree AV block at rate of 56 bpm, left atrial abnormality, normal axis, poor R wave progression, cannot exclude anteroseptal infarct old.  Normal QT interval.  No evidence of ischemia.   No significant change from    EKG 11/17/2018.  Meds ordered this encounter  Medications  . DISCONTD: rosuvastatin (CRESTOR) 20 MG tablet    Sig: Take 0.5 tablets (10 mg total) by mouth at bedtime.    Dispense:  90 tablet    Refill:  3  . nitroGLYCERIN (NITROSTAT) 0.4 MG SL tablet    Sig: Place 1 tablet (0.4 mg total) under the tongue every 5 (five) minutes x 3 doses as needed for chest pain.    Dispense:  25 tablet    Refill:  1  . rosuvastatin (CRESTOR) 20 MG tablet    Sig: Take 1 tablet (20 mg total) by mouth at bedtime.    Dispense:  90 tablet    Refill:  3    Disregard 10 mg Rx  . isosorbide mononitrate (IMDUR) 60 MG 24 hr tablet     Sig: Take  1 tablet (60 mg total) by mouth daily.    Dispense:  90 tablet    Refill:  3  . Olmesartan-amLODIPine-HCTZ 20-5-12.5 MG TABS    Sig: Take 1 tablet by mouth daily.    Dispense:  90 tablet    Refill:  3    Discontinue plain amlodipine    Medications Discontinued During This Encounter  Medication Reason  . latanoprost (XALATAN) 0.005 % ophthalmic solution Completed Course  . Tetrahydrozoline HCl (VISINE OP) Patient Preference  . phenazopyridine (PYRIDIUM) 100 MG tablet No longer needed (for PRN medications)  . nitroGLYCERIN (NITROSTAT) 0.4 MG SL tablet Reorder  . rosuvastatin (CRESTOR) 10 MG tablet Reorder  . rosuvastatin (CRESTOR) 20 MG tablet   . isosorbide mononitrate (IMDUR) 60 MG 24 hr tablet Reorder  . Olmesartan-amLODIPine-HCTZ 20-5-12.5 MG TABS Reorder     Recommendations:   KOSTON HENNES  is a 79 y.o. Caucasian male patient  with h/o NSTEMI in 09/2016 and underwent complex bifurcating the LAD and diagonal vessels with implantation of 4 drug-eluting stents, PCI to RCA stenosis on 10/22/2016. 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  Patient is here for 53-monthoffice visit, he remains essentially asymptomatic with occasional attacks of angina, abdominal nitroglycerin was prescribed/refilled.  Lipids are not controlled, LDL goal is <70, will increase Crestor to 20 mg, obtain lipid profile testing and 4 to 6 weeks.  Hypertension is well controlled, medications were refilled.  Advised him from cardiac standpoint I will see him back in a year.  JAdrian Prows MD, [facility]2/12/2019, 11:47 AM [facility]. PH[place]boroOffice: [phone]

## 2019-06-26 ENCOUNTER — Ambulatory Visit: Payer: Medicare Other | Attending: Internal Medicine

## 2019-06-26 DIAGNOSIS — Z23 Encounter for immunization: Secondary | ICD-10-CM | POA: Insufficient documentation

## 2019-06-26 NOTE — Progress Notes (Signed)
   Covid-19 Vaccination Clinic  Name:  [name]    MRN: [id number] DOB: 03/11/1941  06/26/2019  [name] was observed post Covid-19 immunization for 15 minutes without incidence. He was provided with Vaccine Information Sheet and instruction to access the V-Safe system.   [name] was instructed to call 911 with any severe reactions post vaccine: Marland Kitchen Difficulty breathing  . Swelling of your face and throat  . A fast heartbeat  . A bad rash all over your body  . Dizziness and weakness    Immunizations Administered    Name Date Dose VIS Date Route   Pfizer COVID-19 Vaccine 06/26/2019 10:48 AM 0.3 mL 04/23/2019 Intramuscular   Manufacturer: [place]   Lot: [id number]   [place]: [id number]

## 2019-08-13 LAB — LIPID PANEL WITH LDL/HDL RATIO
Cholesterol, Total: 111 mg/dL (ref 100–199)
HDL: 38 mg/dL — ABNORMAL LOW (ref >39)
LDL Chol Calc (NIH): 46 mg/dL (ref 0–99)
LDL/HDL Ratio: 1.2 ratio (ref 0.0–3.6)
Triglycerides: 156 mg/dL — ABNORMAL HIGH (ref 0–149)
VLDL Cholesterol Cal: 27 mg/dL (ref 5–40)

## 2020-03-11 ENCOUNTER — Ambulatory Visit: Payer: Medicare Other | Attending: Internal Medicine

## 2020-03-11 DIAGNOSIS — Z23 Encounter for immunization: Secondary | ICD-10-CM

## 2020-03-11 NOTE — Progress Notes (Signed)
   Covid-19 Vaccination Clinic  Name:  [name]    MRN: [id number] DOB: 12/05/1940  03/11/2020  [name] was observed post Covid-19 immunization for 15 minutes without incident. He was provided with Vaccine Information Sheet and instruction to access the V-Safe system.   [name] was instructed to call 911 with any severe reactions post vaccine: Marland Kitchen Difficulty breathing  . Swelling of face and throat  . A fast heartbeat  . A bad rash all over body  . Dizziness and weakness

## 2020-06-26 ENCOUNTER — Ambulatory Visit: Payer: Medicare Other | Admitting: Cardiology

## 2020-06-26 ENCOUNTER — Encounter: Payer: Self-pay | Admitting: Cardiology

## 2020-06-26 ENCOUNTER — Other Ambulatory Visit: Payer: Self-pay

## 2020-06-26 VITALS — BP 131/68 | HR 61 | Temp 97.9°F | Resp 17 | Ht 72.0 in | Wt 199.4 lb

## 2020-06-26 DIAGNOSIS — I25118 Atherosclerotic heart disease of native coronary artery with other forms of angina pectoris: Secondary | ICD-10-CM

## 2020-06-26 DIAGNOSIS — I10 Essential (primary) hypertension: Secondary | ICD-10-CM

## 2020-06-26 DIAGNOSIS — E782 Mixed hyperlipidemia: Secondary | ICD-10-CM

## 2020-06-26 DIAGNOSIS — N1832 Chronic kidney disease, stage 3b: Secondary | ICD-10-CM

## 2020-06-26 MED ORDER — OLMESARTAN-AMLODIPINE-HCTZ 20-5-12.5 MG PO TABS: 1.0000 | ORAL_TABLET | Freq: Every day | ORAL | 3 refills | Status: DC

## 2020-06-26 MED ORDER — ISOSORBIDE MONONITRATE ER 60 MG PO TB24: 60.0000 mg | ORAL_TABLET | Freq: Every day | ORAL | 3 refills | Status: DC

## 2020-06-26 MED ORDER — ROSUVASTATIN CALCIUM 20 MG PO TABS: 20.0000 mg | ORAL_TABLET | Freq: Every day | ORAL | 3 refills | Status: DC

## 2020-06-26 NOTE — Progress Notes (Signed)
Primary Physician/Referring:  Burnard Bunting, MD  Patient ID: [name], male    DOB: 1940-07-27, 80 y.o.   MRN: [id number]  Chief Complaint  Patient presents with  . Coronary Artery Disease  . Hyperlipidemia  . Follow-up    1 YEAR   HPI:    [name]  is a 80 y.o. Caucasian male patient  with h/o NSTEMI in 09/2016 and underwent complex bifurcating the LAD and diagonal vessels with implantation of 4 drug-eluting stents, PCI to RCA stenosis on 10/22/2016. 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  Presently asymptomatic and requests refills.  Has not used S/L NTG recently.   Past Medical History:  Diagnosis Date  . Arthritis    "little in my right hand; some in my right big toe" (10/22/2016)  . Borderline diabetes    "tx'd w/RX; blood surgars dropped too low; dr. dc'd it 09/25/2016; blood sugars fine since then" (10/22/2016)  . Chronic kidney disease    S/P right nephrectomy in 1984  . History of gout 04/2016; 09/2016   RLE; RLE  . History of kidney stones   . Hyperlipidemia   . Hypertension   . MGUS (monoclonal gammopathy of unknown significance)   . Migraine    "none in years" (10/22/2016)  . Myeloma ([place])    "dormant for the last 2-3 years" (10/22/2016)  . NSTEMI (non-ST elevated myocardial infarction) ([place]) 09/25/2016  . 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 Prostate cancer ([place]) 2006   s/p brachytherapy  . Seborrheic keratosis   . 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   history of nephrectomy in 1984   Past Surgical History:  Procedure Laterality Date  . CARDIAC CATHETERIZATION  1984  . CORONARY ANGIOPLASTY WITH STENT PLACEMENT  09/26/2016; 10/22/2016   "4 stents; 2 stents"  . CORONARY STENT INTERVENTION Right  09/26/2016   Procedure: Coronary Stent Intervention;  Surgeon: Adrian Prows, MD;  Location: [place] [facility];  Service: Cardiovascular;  Laterality: Right;  . CORONARY STENT INTERVENTION N/A 10/22/2016   Procedure: Coronary Stent Intervention;  Surgeon: Adrian Prows, MD;  Location: [place] [facility];  Service: Cardiovascular;  Laterality: N/A;  . CYSTOSCOPY/URETEROSCOPY/HOLMIUM LASER/STENT PLACEMENT Left 11/29/2018   Procedure: CYSTOSCOPY/URETEROSCOPY/HOLMIUM LASER/STENT PLACEMENTleft retrograde pylegram;  Surgeon: Ardis Hughs, MD;  [facility];  Service: Urology;  Laterality: Left;  . HOLMIUM LASER APPLICATION  2/70/6237   Procedure: HOLMIUM LASER APPLICATION;  Surgeon: Ardis Hughs, MD;  Location[facility];  Service: Urology;;  . [place]  2009?  Marland Kitchen LEFT HEART CATH AND CORONARY ANGIOGRAPHY N/A 09/26/2016   Procedure: Left Heart Cath and Coronary Angiography;  Surgeon: Adrian Prows, MD;  Location: [place] [facility];  Service: Cardiovascular;  Laterality: N/A;  . NEPHRECTOMY Right 1984   [facility]  . REPAIR KNEE LIGAMENT  04/2016   "tore all the ligaments; had to put 3 pins in to reattach; Dr. Gladstone Lighter"  . TONSILLECTOMY     Social History   Tobacco Use  . Smoking status: Never Smoker  . Smokeless tobacco: Never Used  Substance Use Topics  . Alcohol use: Yes    Comment: very little    ROS  Review of Systems  Cardiovascular: Positive for chest pain (stable angina). Negative for dyspnea on exertion and leg swelling.  Gastrointestinal: Negative for melena.   Objective  Blood pressure 131/68, pulse 61, temperature 97.9 F (36.6 C), temperature source Temporal, resp. rate 17, height 6' (1.829 m), weight 199 lb 6.4 oz (90.4 kg), SpO2 99 %.  Vitals with BMI 06/26/2020 06/21/2019 12/02/2018  Height '6\' 0"'  '6\' 0"'  -  Weight 199 lbs 6 oz 204 lbs -  BMI 18.84 16.60 -  Systolic 630 160 109  Diastolic 68 72 68  Pulse 61 58 49     Physical Exam Neck:     Thyroid:  No thyromegaly.  Cardiovascular:     Rate and Rhythm: Normal rate and regular rhythm.     Pulses: Intact distal pulses.     Heart sounds: Normal heart sounds. No murmur heard. No gallop.      Comments: No leg edema, no JVD. Pulmonary:     Effort: Pulmonary effort is normal.     Breath sounds: Normal breath sounds.  Abdominal:     General: Bowel sounds are normal.     Palpations: Abdomen is soft.  Musculoskeletal:     Cervical back: Neck supple.  Skin:    General: Skin is warm and dry.    Laboratory examination:   No results for input(s): NA, K, CL, CO2, GLUCOSE, BUN, CREATININE, CALCIUM, GFRNONAA, GFRAA in the last 8760 hours. CrCl cannot be calculated (Patient's most recent lab result is older than the maximum 21 days allowed.).  CMP Latest Ref Rng & Units 12/02/2018 12/01/2018 11/30/2018  Glucose 70 - 99 mg/dL 87 104(H) 235(H)  BUN 8 - 23 mg/dL 45(H) 45(H) 54(H)  Creatinine 0.61 - 1.24 mg/dL 2.10(H) 2.63(H) 4.39(H)  Sodium 135 - 145 mmol/L 137 137 139  Potassium 3.5 - 5.1 mmol/L 4.1 4.3 5.5(H)  Chloride 98 - 111 mmol/L 107 107 109  CO2 22 - 32 mmol/L 24 21(L) 20(L)  Calcium 8.9 - 10.3 mg/dL 8.3(L) 8.2(L) 8.3(L)  Total Protein 6.5 - 8.1 g/dL - - 5.7(L)  Total Bilirubin 0.3 - 1.2 mg/dL - - 0.8  Alkaline Phos 38 - 126 U/L - - 73  AST 15 - 41 U/L - - 17  ALT 0 - 44 U/L - - 17   CBC Latest Ref Rng & Units 11/30/2018 11/29/2018 10/23/2016  WBC 4.0 - 10.5 K/uL 10.4 9.2 7.0  Hemoglobin 13.0 - 17.0 g/dL 13.1 14.5 11.3(L)  Hematocrit 39.0 - 52.0 % 41.0 44.1 35.2(L)  Platelets 150 - 400 K/uL 148(L) 163 153   Lipid Panel Recent Labs    08/12/19 0848  CHOL 111  TRIG 156*  LDLCALC 46  HDL 38*    HEMOGLOBIN A1C Lab Results  Component Value Date   HGBA1C 7.1 (H) 11/30/2018   MPG 157.07 11/30/2018   TSH No results for input(s): TSH in the last 8760 hours.  External labs 02/04/2019:   Labs 02/29/2020:  Serum glucose 69 mg, BUN 34, creatinine 2.1, EGFR 30.5.  Sodium 144,  potassium 4.3, LFTs normal.  Hb 13.5/HCT 39.8, platelets 131.  Total cholesterol 115, triglycerides 144, HDL 35, LDL 51.  A1c 6.9%.  TSH normal.   Medications and allergies   Allergies  Allergen Reactions  . Sulfa Antibiotics Hives  . Morphine And Related Other (See Comments)    Spontaneous body movements  . Lisinopril Cough    Current Outpatient Medications on File Prior to Visit  Medication Sig Dispense Refill  . Ascorbic Acid (C-1000 SR) 1000 MG TBCR Take 1 tablet by mouth daily.    Marland Kitchen aspirin EC  81 MG tablet Take 81 mg by mouth daily.    Marland Kitchen BYSTOLIC 20 MG TABS Take 10 mg by mouth daily. daily    . glimepiride (AMARYL) 2 MG tablet Take 1 tablet (2 mg total) by mouth daily with breakfast. Reduce the dose to half until  resumed by your primary care doctor    . neomycin-polymyxin-dexamethasone (MAXITROL) 0.1 % ophthalmic suspension at bedtime.    . nitroGLYCERIN (NITROSTAT) 0.4 MG SL tablet Place 1 tablet (0.4 mg total) under the tongue every 5 (five) minutes x 3 doses as needed for chest pain. 25 tablet 1  . oxybutynin (DITROPAN) 5 MG tablet Take 1 tablet by mouth daily.    . phenazopyridine (PYRIDIUM) 100 MG tablet Take 1 tablet by mouth in the morning and at bedtime.    . tamsulosin (FLOMAX) 0.4 MG CAPS capsule Take 0.4 mg by mouth daily.     No current facility-administered medications on file prior to visit.     Radiology:  No results found.  Cardiac Studies:   Coronary angiogram 10/22/2016:  Stenting distal RCA with 3.0 x 18 and ostial RCA with 4.0 x 18 mm Promus premier DES.  History of PTCA 09/26/16:  LAD and large D2 subtotally occluded S/P crush stenting of the bifurcating LAD & D1 with 2.0 x 26 mm resolute onyx distally and 3.0 x 26 mm resolute onyx proximally into the large  D1 extending into the mid LAD crushing the mid LAD 2.5 x 18 mm resolute onyx stent distal to the D1 origin, Proximal LAD stented with an additional 3.0 x 15 mm resolute onyx  stent.  Echocardiogram 06/10/2017: Left ventricle cavity is normal in size. Mild concentric hypertrophy of the left ventricle. Normal global wall motion. Doppler evidence of grade I (impaired) diastolic dysfunction, normal LAP. Calculated EF 56%. Left atrial cavity is mildly dilated. Structurally normal mitral valve with mild (Grade I) regurgitation. IVC is dilated with respiratory variation. This may suggest elevated right atrial pressure.  EKG:    EKG 06/26/2020: Sinus bradycardia with first-degree AV block at rate of 54 bpm, normal axis, no evidence of ischemia, otherwise normal EKG. no significant change from 06/21/2019.  Assessment     ICD-10-CM   1. Coronary artery disease of native artery of native heart with stable angina pectoris (HCC)  I25.118 EKG 12-Lead    isosorbide mononitrate (IMDUR) 60 MG 24 hr tablet  2. Essential hypertension  I10 Olmesartan-amLODIPine-HCTZ 20-5-12.5 MG TABS  3. Mixed hyperlipidemia  E78.2 rosuvastatin (CRESTOR) 20 MG tablet  4. Stage 3b chronic kidney disease (HCC)  N18.32     Meds ordered this encounter  Medications  . isosorbide mononitrate (IMDUR) 60 MG 24 hr tablet    Sig: Take 1 tablet (60 mg total) by mouth daily.    Dispense:  90 tablet    Refill:  3  . Olmesartan-amLODIPine-HCTZ 20-5-12.5 MG TABS    Sig: Take 1 tablet by mouth daily.    Dispense:  90 tablet    Refill:  3    Discontinue plain amlodipine  . rosuvastatin (CRESTOR) 20 MG tablet    Sig: Take 1 tablet (20 mg total) by mouth at bedtime.    Dispense:  90 tablet    Refill:  3    Disregard 10 mg Rx    Medications Discontinued During This Encounter  Medication Reason  . acetaminophen (TYLENOL) 500 MG tablet No longer needed (for PRN medications)  . colchicine 0.6 MG tablet No longer needed (for PRN medications)  .  olmesartan-hydrochlorothiazide (BENICAR HCT) 86-75.1 MG tablet Duplicate  . lisinopril-hydrochlorothiazide (ZESTORETIC) 20-12.5 MG tablet Error  . rosuvastatin  (CRESTOR) 20 MG tablet Reorder  . isosorbide mononitrate (IMDUR) 60 MG 24 hr tablet Reorder  . Olmesartan-amLODIPine-HCTZ 20-5-12.5 MG TABS Reorder  . ticagrelor (BRILINTA) 90 MG TABS tablet Completed Course     Recommendations:   SEVERO BEBER  is a 80 y.o. Caucasian male patient  with h/o NSTEMI in 09/2016 and underwent complex bifurcating the LAD and diagonal vessels with implantation of 4 drug-eluting stents, PCI to RCA stenosis on 10/22/2016. 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  Patient is here for annual  office visit, he remains essentially asymptomatic with occasional attacks of angina, he continues to remain active.  I reviewed his external labs, renal function has remained stable at stage IIIb, lipids are now well controlled and he is tolerating Crestor 20 mg daily.  Blood pressure is also well controlled.  No clinical evidence of heart failure.  No change in the EKG.  No changes in the medications were done today, I will see him back on annual basis.   Adrian Prows, MD, [facility] 06/26/2020, 11:06 AM Office: [phone] Pager: [phone]

## 2021-06-23 ENCOUNTER — Other Ambulatory Visit: Payer: Self-pay | Admitting: Cardiology

## 2021-06-23 DIAGNOSIS — I10 Essential (primary) hypertension: Secondary | ICD-10-CM

## 2021-06-27 ENCOUNTER — Other Ambulatory Visit: Payer: Self-pay

## 2021-06-27 ENCOUNTER — Telehealth: Payer: Self-pay

## 2021-06-27 ENCOUNTER — Ambulatory Visit: Payer: Medicare Other | Admitting: Cardiology

## 2021-06-27 ENCOUNTER — Encounter: Payer: Self-pay | Admitting: Cardiology

## 2021-06-27 VITALS — BP 129/68 | HR 71 | Temp 98.4°F | Resp 16 | Ht 72.0 in | Wt 194.0 lb

## 2021-06-27 DIAGNOSIS — I25118 Atherosclerotic heart disease of native coronary artery with other forms of angina pectoris: Secondary | ICD-10-CM

## 2021-06-27 DIAGNOSIS — E782 Mixed hyperlipidemia: Secondary | ICD-10-CM

## 2021-06-27 DIAGNOSIS — I10 Essential (primary) hypertension: Secondary | ICD-10-CM

## 2021-06-27 DIAGNOSIS — N184 Chronic kidney disease, stage 4 (severe): Secondary | ICD-10-CM

## 2021-06-27 MED ORDER — OLMESARTAN-AMLODIPINE-HCTZ 20-5-12.5 MG PO TABS: 1.0000 | ORAL_TABLET | Freq: Every day | ORAL | 3 refills | Status: DC

## 2021-06-27 MED ORDER — KERENDIA 10 MG PO TABS: 10.0000 | ORAL_TABLET | Freq: Every day | ORAL | 11 refills | Status: DC

## 2021-06-27 MED ORDER — NITROGLYCERIN 0.4 MG SL SUBL: 0.4000 mg | SUBLINGUAL_TABLET | SUBLINGUAL | 1 refills | Status: DC | PRN

## 2021-06-27 MED ORDER — ROSUVASTATIN CALCIUM 20 MG PO TABS: 20.0000 mg | ORAL_TABLET | Freq: Every day | ORAL | 3 refills | Status: DC

## 2021-06-27 MED ORDER — KERENDIA 10 MG PO TABS: 1.0000 | ORAL_TABLET | Freq: Every day | ORAL | 6 refills | Status: DC

## 2021-06-27 MED ORDER — ISOSORBIDE MONONITRATE ER 60 MG PO TB24: 60.0000 mg | ORAL_TABLET | Freq: Every day | ORAL | 3 refills | Status: DC

## 2021-06-27 NOTE — Progress Notes (Addendum)
Primary Physician/Referring:  Burnard Bunting, MD  Patient ID: [name], male    DOB: Oct 01, 1940, 81 y.o.   MRN: [id number]  Chief Complaint  Patient presents with   Coronary Artery Disease   Hypertension   Hyperlipidemia   Follow-up    1 year   HPI:    [name]  is a 81 y.o. Caucasian male patient  with h/o NSTEMI in 09/2016 and underwent complex bifurcating the LAD and diagonal vessels with implantation of 4 drug-eluting stents, PCI to RCA stenosis on 10/22/2016. 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  Presently asymptomatic and requests refills.  Has not used S/L NTG recently.  Requests refill.  Past Medical History:  Diagnosis Date   Arthritis    "little in my right hand; some in my right big toe" (10/22/2016)   Borderline diabetes    "tx'd w/RX; blood surgars dropped too low; dr. 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[place])    "dormant for the last 2-3 years" (10/22/2016)   NSTEMI (non-ST elevated myocardial infarction) ([place]) 09/25/2016   Oncocytoma 1993   s/p right nephrectomy at [facility] in [place] ([place]) 2006   s/p brachytherapy   Seborrheic keratosis    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   history of nephrectomy in 1984   Past Surgical History:  Procedure Laterality Date   [place]  09/26/2016; 10/22/2016   "4 stents; 2 stents"   CORONARY STENT INTERVENTION  Right 09/26/2016   Procedure: Coronary Stent Intervention;  Surgeon: Adrian Prows, MD;  Location: [place] [facility];  Service: Cardiovascular;  Laterality: Right;   CORONARY STENT INTERVENTION N/A 10/22/2016   Procedure: Coronary Stent Intervention;  Surgeon: Adrian Prows, MD;  Location: [place] [facility];  Service: Cardiovascular;  Laterality: N/A;   CYSTOSCOPY/URETEROSCOPY/HOLMIUM LASER/STENT PLACEMENT Left 11/29/2018   Procedure: CYSTOSCOPY/URETEROSCOPY/HOLMIUM LASER/STENT PLACEMENTleft retrograde pylegram;  Surgeon: Ardis Hughs, MD;  Location: [facility];  Service: Urology;  Laterality: Left;   HOLMIUM LASER APPLICATION  5/45/6256   Procedure: HOLMIUM LASER APPLICATION;  Surgeon: Ardis Hughs, MD;  Location: [facility];  Service: Urology;;   INSERTION PROSTATE RADIATION SEED  2009?   LEFT HEART CATH AND CORONARY ANGIOGRAPHY N/A 09/26/2016   Procedure: Left Heart Cath and Coronary Angiography;  Surgeon: Adrian Prows, MD;  Location: [place] [facility];  Service: Cardiovascular;  Laterality: N/A;   NEPHRECTOMY Right [place]  04/2016   "tore all the ligaments; had to put 3 pins in to reattach; Dr. Gladstone Lighter"   TONSILLECTOMY     Social History   Tobacco Use   Smoking status: Never   Smokeless tobacco: Never  Substance Use Topics   Alcohol use: Yes    Comment: very little    ROS  Review of Systems  Cardiovascular:  Negative for chest pain, dyspnea on exertion and leg swelling.  Gastrointestinal:  Negative for melena.  Objective  Blood pressure 129/68, pulse 71, temperature 98.4 F (36.9 C), temperature source Temporal, resp. rate 16, height 6' (1.829 m), weight 194 lb (88 kg), SpO2 95 %.  Vitals with BMI 06/27/2021 06/26/2020 06/21/2019  Height '6\' 0"'  '6\' 0"'  '6\' 0"'   Weight 194 lbs 199 lbs 6 oz 204 lbs  BMI 26.31 94.17 40.81  Systolic 448 185 631  Diastolic 68 68 72  Pulse 71 61 58     Physical Exam Neck:     Thyroid: No thyromegaly.  Cardiovascular:      Rate and Rhythm: Normal rate and regular rhythm.     Pulses: Intact distal pulses.          Dorsalis pedis pulses are 2+ on the right side and 1+ on the left side.       Posterior tibial pulses are 2+ on the right side and 1+ on the left side.     Heart sounds: Normal heart sounds. No murmur heard.   No gallop.     Comments: No leg edema, no JVD. Pulmonary:     Effort: Pulmonary effort is normal.     Breath sounds: Normal breath sounds.  Abdominal:     General: Bowel sounds are normal.     Palpations: Abdomen is soft.  Skin:    General: Skin is warm and dry.   Laboratory examination:   Lipid Panel No results for input(s): CHOL, TRIG, LDLCALC, VLDL, HDL, CHOLHDL, LDLDIRECT in the last 8760 hours.   External labs 02/04/2019:  Labs 05/21/2021:  A1c 8.6%.  Labs 01/22/2021:  Serum glucose 152 mg, BUN 41, creatinine 2.6, EGFR 23.8 mL / 28.8 mL.  Potassium 5.1.  Labs 02/29/2020:  Serum glucose 69 mg, BUN 34, creatinine 2.1, EGFR 30.5.  Sodium 144, potassium 4.3, LFTs normal.  Hb 13.5/HCT 39.8, platelets 131.  Total cholesterol 115, triglycerides 144, HDL 35, LDL 51.  A1c 6.9%.  TSH normal.   Medications and allergies   Allergies  Allergen Reactions   Sulfa Antibiotics Hives   Morphine And Related Other (See Comments)    Spontaneous body movements   Lisinopril Cough    Current Outpatient Medications:    Ascorbic Acid (C-1000 SR) 1000 MG TBCR, Take 1 tablet by mouth daily., Disp: , Rfl:    aspirin EC 81 MG tablet, Take 81 mg by mouth daily., Disp: , Rfl:    BYSTOLIC 20 MG TABS, Take 10 mg by mouth daily. daily, Disp: , Rfl:    glimepiride (AMARYL) 2 MG tablet, Take 1 tablet (2 mg total) by mouth daily with breakfast. Reduce the dose to half until  resumed by your primary care doctor, Disp: , Rfl:    neomycin-polymyxin-dexamethasone (MAXITROL) 0.1 % ophthalmic suspension, at bedtime., Disp: , Rfl:    Finerenone (KERENDIA) 10 MG TABS, Take 1 tablet by mouth daily., Disp: 30  tablet, Rfl: 6   isosorbide mononitrate (IMDUR) 60 MG 24 hr tablet, Take 1 tablet (60 mg total) by mouth daily., Disp: 90 tablet, Rfl: 3   nitroGLYCERIN (NITROSTAT) 0.4 MG SL tablet, Place 1 tablet (0.4 mg total) under the tongue every 5 (five) minutes x 3 doses as needed for chest pain., Disp: 25 tablet, Rfl: 1   Olmesartan-amLODIPine-HCTZ 20-5-12.5 MG TABS, Take 1 tablet by mouth daily., Disp: 90 tablet, Rfl: 3   rosuvastatin (CRESTOR) 20 MG tablet, Take 1 tablet (20 mg total) by mouth at bedtime., Disp: 90 tablet, Rfl: 3   Radiology:  No results found.  Cardiac Studies:   Coronary angiogram 10/22/2016:  Stenting distal RCA with 3.0 x 18 and ostial RCA with 4.0 x 18 mm Promus premier DES.  History of PTCA 09/26/16:  LAD and large D2 subtotally occluded S/P crush stenting of the bifurcating LAD & D1 with 2.0 x 26 mm resolute onyx distally and 3.0 x 26 mm resolute onyx proximally into the large  D1 extending into the mid LAD crushing the mid LAD 2.5 x 18 mm resolute onyx stent distal to the D1 origin, Proximal LAD stented with an additional 3.0 x 15 mm resolute onyx stent.  Echocardiogram 06/10/2017: Left ventricle cavity is normal in size. Mild concentric hypertrophy of the left ventricle. Normal global wall motion. Doppler evidence of grade I (impaired) diastolic dysfunction, normal LAP. Calculated EF 56%. Left atrial cavity is mildly dilated. Structurally normal mitral valve with mild (Grade I) regurgitation. IVC is dilated with respiratory variation. This may suggest elevated right atrial pressure.  EKG: EKG 06/27/2021: Sinus rhythm with first-degree AV block at rate of 62 bpm, otherwise normal EKG. no significant change from 06/26/2020, previously heart rate was 54 bpm.  Assessment     ICD-10-CM   1. Coronary artery disease of native artery of native heart with stable angina pectoris (HCC)  I25.118 EKG 12-Lead    isosorbide mononitrate (IMDUR) 60 MG 24 hr tablet    nitroGLYCERIN  (NITROSTAT) 0.4 MG SL tablet    COMPLETE METABOLIC PANEL WITH GFR    Lipid Panel With LDL/HDL Ratio    Finerenone (KERENDIA) 10 MG TABS    DISCONTINUED: Finerenone (KERENDIA) 10 MG TABS    2. Primary hypertension  I10     3. Mixed hyperlipidemia  E78.2 rosuvastatin (CRESTOR) 20 MG tablet    4. Stage 4 chronic kidney disease (HCC)  N18.4 Finerenone (KERENDIA) 10 MG TABS    DISCONTINUED: Finerenone (KERENDIA) 10 MG TABS    5. Essential hypertension  I10 Olmesartan-amLODIPine-HCTZ 20-5-12.5 MG TABS      Meds ordered this encounter  Medications   rosuvastatin (CRESTOR) 20 MG tablet    Sig: Take 1 tablet (20 mg total) by mouth at bedtime.    Dispense:  90 tablet    Refill:  3    Disregard 10 mg Rx   Olmesartan-amLODIPine-HCTZ 20-5-12.5 MG TABS    Sig: Take 1 tablet by mouth daily.    Dispense:  90 tablet    Refill:  3    Discontinue plain amlodipine   isosorbide mononitrate (IMDUR) 60 MG 24 hr tablet    Sig: Take 1 tablet (60 mg total) by mouth daily.    Dispense:  90 tablet    Refill:  3   nitroGLYCERIN (NITROSTAT) 0.4 MG SL tablet    Sig: Place 1 tablet (0.4 mg total) under the tongue every 5 (five) minutes x 3 doses as needed for chest pain.    Dispense:  25 tablet    Refill:  1   DISCONTD: Finerenone (KERENDIA) 10 MG TABS    Sig: Take 10 tablets by mouth daily.    Dispense:  30 tablet    Refill:  11   Finerenone (KERENDIA) 10 MG TABS    Sig: Take 1 tablet by mouth daily.    Dispense:  30 tablet    Refill:  6    Medications Discontinued During This Encounter  Medication Reason   tamsulosin (FLOMAX) 0.4 MG CAPS capsule    phenazopyridine (PYRIDIUM) 100 MG tablet    oxybutynin (  DITROPAN) 5 MG tablet    nitroGLYCERIN (NITROSTAT) 0.4 MG SL tablet Reorder   isosorbide mononitrate (IMDUR) 60 MG 24 hr tablet Reorder   Olmesartan-amLODIPine-HCTZ 20-5-12.5 MG TABS Reorder   rosuvastatin (CRESTOR) 20 MG tablet Reorder   Finerenone (KERENDIA) 10 MG TABS       Recommendations:   ALEK BORGES  is a 81 y.o. Caucasian male patient  with h/o NSTEMI in 09/2016 and underwent complex bifurcating the LAD and diagonal vessels with implantation of 4 drug-eluting stents, PCI to RCA stenosis on 10/22/2016. 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  Patient is here for annual  office visit, he remains essentially asymptomatic with occasional attacks of angina, he continues to remain active.  I reviewed his external labs, renal function has slightly improved.  He was stage IIIb last year.  No stage IV.  I will start him on Kerendia 10 mg daily, obtain CMP along with lipids in 3 to 4 weeks.  Blood pressure is well controlled, refill his medication.  Discussed regarding physical activity.  Office visit in a year or sooner if problems.   Adrian Prows, MD, [facility] 06/27/2021, 3:18 PM Office: [phone] Pager: [phone]

## 2021-06-27 NOTE — Telephone Encounter (Signed)
Medication change 

## 2021-06-27 NOTE — Patient Instructions (Signed)
Blood work in 3 weeks.  Go to [facility] on empty stomach.

## 2021-06-27 NOTE — Addendum Note (Signed)
Addended by: Kela Millin on: 06/27/2021 03:18 PM   Modules accepted: Orders

## 2021-07-19 LAB — LIPID PANEL WITH LDL/HDL RATIO
Cholesterol, Total: 121 mg/dL (ref 100–199)
HDL: 37 mg/dL — ABNORMAL LOW (ref >39)
LDL Chol Calc (NIH): 58 mg/dL (ref 0–99)
LDL/HDL Ratio: 1.6 ratio (ref 0.0–3.6)
Triglycerides: 154 mg/dL — ABNORMAL HIGH (ref 0–149)
VLDL Cholesterol Cal: 26 mg/dL (ref 5–40)

## 2021-08-13 ENCOUNTER — Emergency Department (HOSPITAL_BASED_OUTPATIENT_CLINIC_OR_DEPARTMENT_OTHER): Payer: Medicare Other

## 2021-08-13 ENCOUNTER — Other Ambulatory Visit: Payer: Self-pay

## 2021-08-13 ENCOUNTER — Emergency Department (HOSPITAL_BASED_OUTPATIENT_CLINIC_OR_DEPARTMENT_OTHER)
Admission: EM | Admit: 2021-08-13 | Discharge: 2021-08-13 | Disposition: A | Discharge status: Home / Self Care | Payer: Medicare Other | Attending: Emergency Medicine | Admitting: Emergency Medicine

## 2021-08-13 ENCOUNTER — Encounter (HOSPITAL_BASED_OUTPATIENT_CLINIC_OR_DEPARTMENT_OTHER): Payer: Self-pay

## 2021-08-13 DIAGNOSIS — S0081XA Abrasion of other part of head, initial encounter: Secondary | ICD-10-CM | POA: Diagnosis not present

## 2021-08-13 DIAGNOSIS — N185 Chronic kidney disease, stage 5: Secondary | ICD-10-CM | POA: Insufficient documentation

## 2021-08-13 DIAGNOSIS — Z7982 Long term (current) use of aspirin: Secondary | ICD-10-CM | POA: Insufficient documentation

## 2021-08-13 DIAGNOSIS — C9000 Multiple myeloma not having achieved remission: Secondary | ICD-10-CM | POA: Diagnosis not present

## 2021-08-13 DIAGNOSIS — S60512A Abrasion of left hand, initial encounter: Secondary | ICD-10-CM | POA: Insufficient documentation

## 2021-08-13 DIAGNOSIS — Y92513 Shop (commercial) as the place of occurrence of the external cause: Secondary | ICD-10-CM | POA: Diagnosis not present

## 2021-08-13 DIAGNOSIS — S0090XA Unspecified superficial injury of unspecified part of head, initial encounter: Secondary | ICD-10-CM | POA: Diagnosis present

## 2021-08-13 DIAGNOSIS — D631 Anemia in chronic kidney disease: Secondary | ICD-10-CM | POA: Insufficient documentation

## 2021-08-13 DIAGNOSIS — W010XXA Fall on same level from slipping, tripping and stumbling without subsequent striking against object, initial encounter: Secondary | ICD-10-CM | POA: Insufficient documentation

## 2021-08-13 DIAGNOSIS — Z8579 Personal history of other malignant neoplasms of lymphoid, hematopoietic and related tissues: Secondary | ICD-10-CM | POA: Insufficient documentation

## 2021-08-13 DIAGNOSIS — S80211A Abrasion, right knee, initial encounter: Secondary | ICD-10-CM | POA: Diagnosis not present

## 2021-08-13 DIAGNOSIS — T07XXXA Unspecified multiple injuries, initial encounter: Secondary | ICD-10-CM

## 2021-08-13 DIAGNOSIS — W19XXXA Unspecified fall, initial encounter: Secondary | ICD-10-CM

## 2021-08-13 DIAGNOSIS — S50311A Abrasion of right elbow, initial encounter: Secondary | ICD-10-CM | POA: Insufficient documentation

## 2021-08-13 DIAGNOSIS — Y9301 Activity, walking, marching and hiking: Secondary | ICD-10-CM | POA: Insufficient documentation

## 2021-08-13 DIAGNOSIS — D649 Anemia, unspecified: Secondary | ICD-10-CM

## 2021-08-13 LAB — BASIC METABOLIC PANEL
Anion gap: 10 (ref 5–15)
BUN: 61 mg/dL — ABNORMAL HIGH (ref 8–23)
CO2: 19 mmol/L — ABNORMAL LOW (ref 22–32)
Calcium: 8.7 mg/dL — ABNORMAL LOW (ref 8.9–10.3)
Chloride: 110 mmol/L (ref 98–111)
Creatinine, Ser: 4.22 mg/dL — ABNORMAL HIGH (ref 0.61–1.24)
GFR, Estimated: 13 mL/min — ABNORMAL LOW (ref >60)
Glucose, Bld: 173 mg/dL — ABNORMAL HIGH (ref 70–99)
Potassium: 4.4 mmol/L (ref 3.5–5.1)
Sodium: 139 mmol/L (ref 135–145)

## 2021-08-13 LAB — IRON AND TIBC
Iron: 25 ug/dL — ABNORMAL LOW (ref 45–182)
Saturation Ratios: 7 % — ABNORMAL LOW (ref 17.9–39.5)
TIBC: 342 ug/dL (ref 250–450)
UIBC: 317 ug/dL

## 2021-08-13 LAB — CBC
HCT: 25.7 % — ABNORMAL LOW (ref 39.0–52.0)
Hemoglobin: 8.4 g/dL — ABNORMAL LOW (ref 13.0–17.0)
MCH: 28.5 pg (ref 26.0–34.0)
MCHC: 32.7 g/dL (ref 30.0–36.0)
MCV: 87.1 fL (ref 80.0–100.0)
Platelets: 174 10*3/uL (ref 150–400)
RBC: 2.95 MIL/uL — ABNORMAL LOW (ref 4.22–5.81)
RDW: 14.7 % (ref 11.5–15.5)
WBC: 8 10*3/uL (ref 4.0–10.5)
nRBC: 0 % (ref 0.0–0.2)

## 2021-08-13 LAB — RETICULOCYTES
Immature Retic Fract: 11.1 % (ref 2.3–15.9)
RBC.: 3.11 MIL/uL — ABNORMAL LOW (ref 4.22–5.81)
Retic Count, Absolute: 70.3 10*3/uL (ref 19.0–186.0)
Retic Ct Pct: 2.3 % (ref 0.4–3.1)

## 2021-08-13 LAB — FERRITIN: Ferritin: 153 ng/mL (ref 24–336)

## 2021-08-13 LAB — VITAMIN B12: Vitamin B-12: 4870 pg/mL — ABNORMAL HIGH (ref 180–914)

## 2021-08-13 LAB — FOLATE: Folate: 10.8 ng/mL (ref >5.9)

## 2021-08-13 IMAGING — CT CT HEAD W/O CM
4 series · 16 of 47 positions shown, 18 images · non-contrast
Comparison: None.

CLINICAL DATA: Head trauma, moderate-severe; Neck trauma (Age >=
65y)



[Series 2: head wo · axial · 0.45mm/px · z∈[-244,-124]mm · 7 of 32 slices shown, 9 images]
[im 4/32  brain]
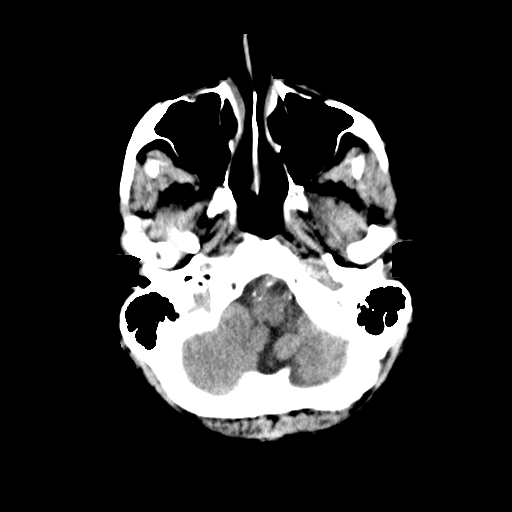
[im 4/32  bone]
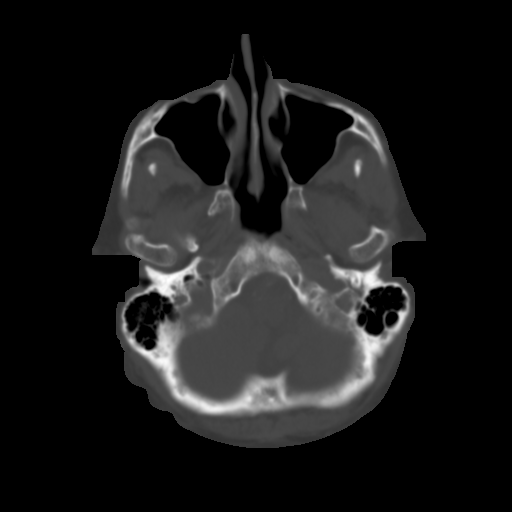
[im 8/32  brain]
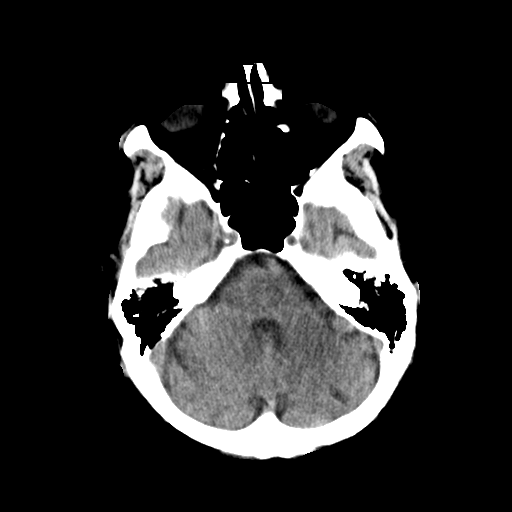
[im 12/32  brain]
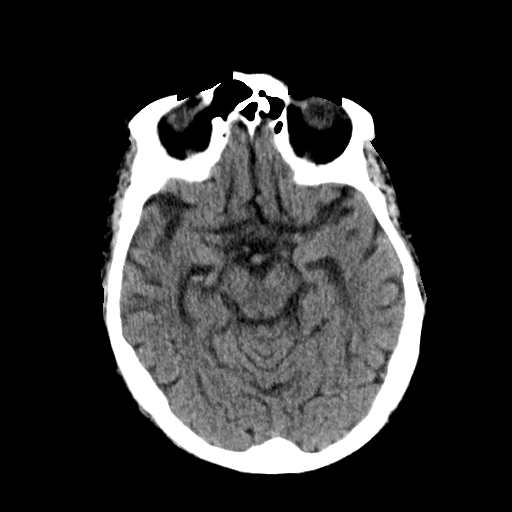
[im 16/32  brain]
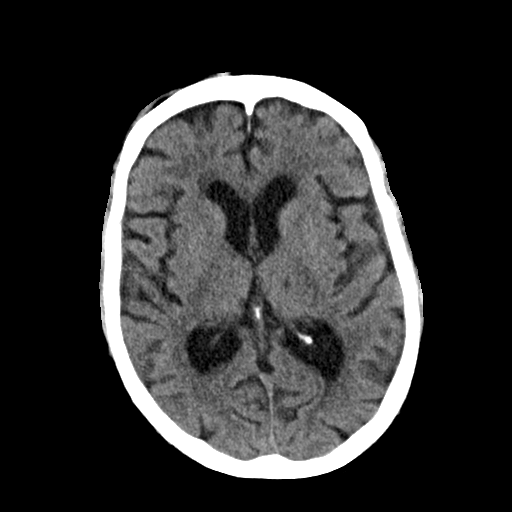
[im 20/32  brain]
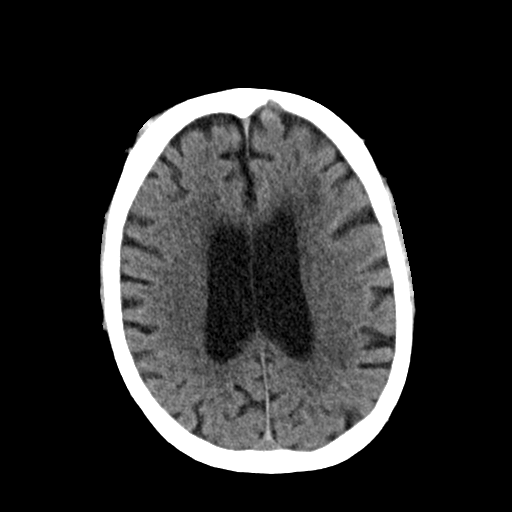
[im 20/32  bone]
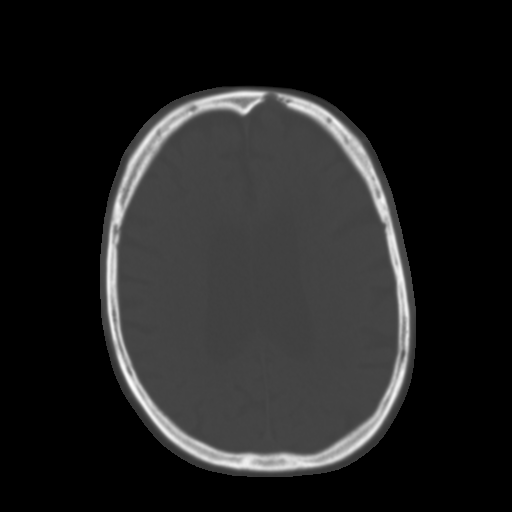
[im 24/32  brain]
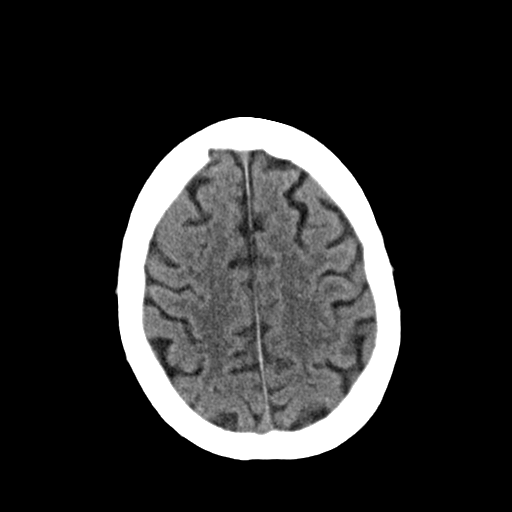
[im 28/32  brain]
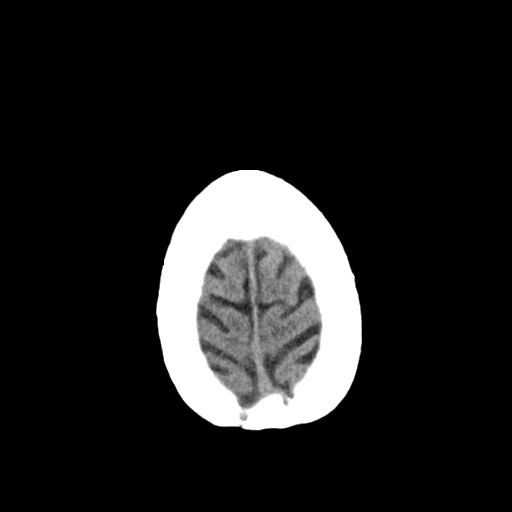

[Series 3: head bone · axial · 0.45mm/px · z∈[-245,-213]mm · 3 of 80 slices shown]
[im 8/80  bone]
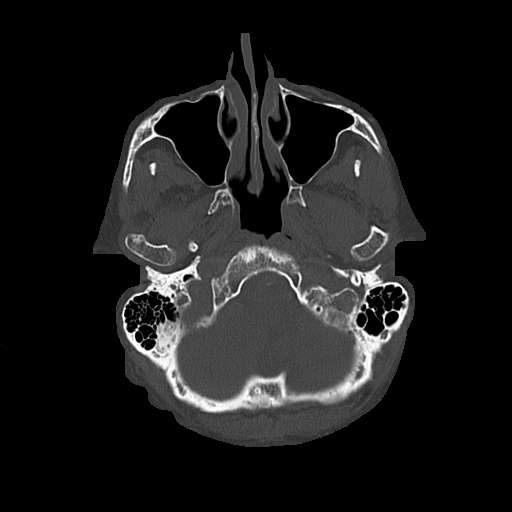
[im 16/80  bone]
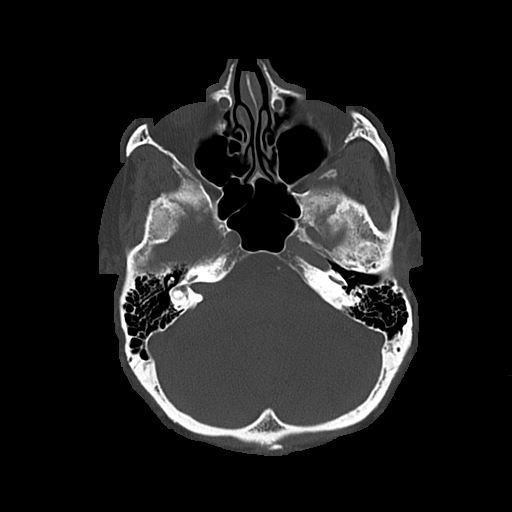
[im 24/80  bone]
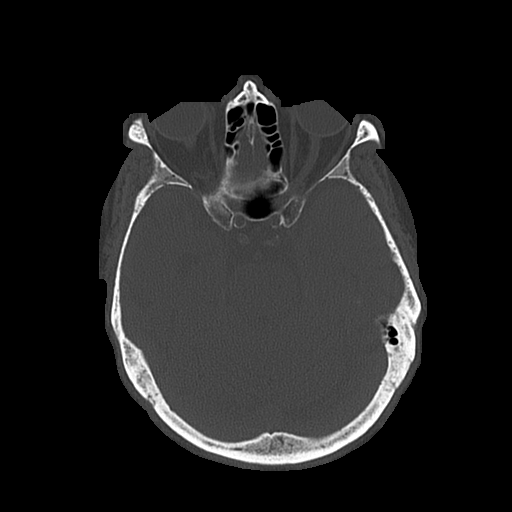

[Series 4: coronal soft · coronal · 0.34mm/px · 3 of 68 slices shown]
[im 23/68  brain]
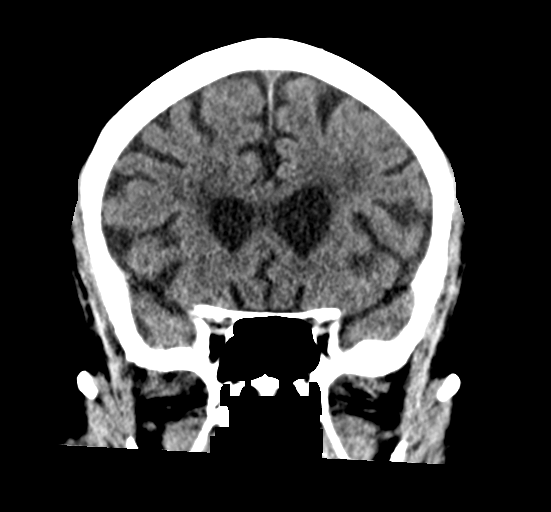
[im 30/68  brain]
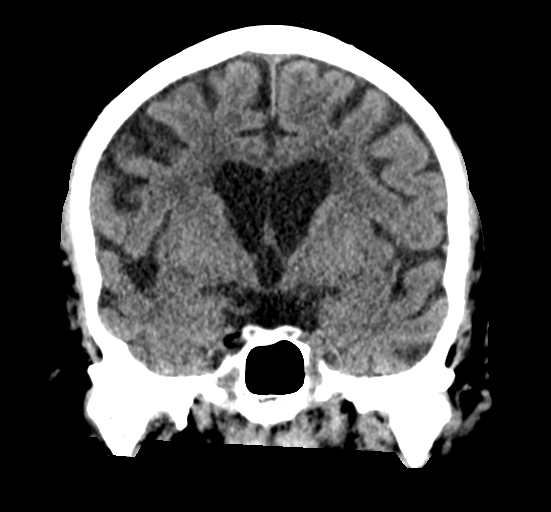
[im 38/68  brain]
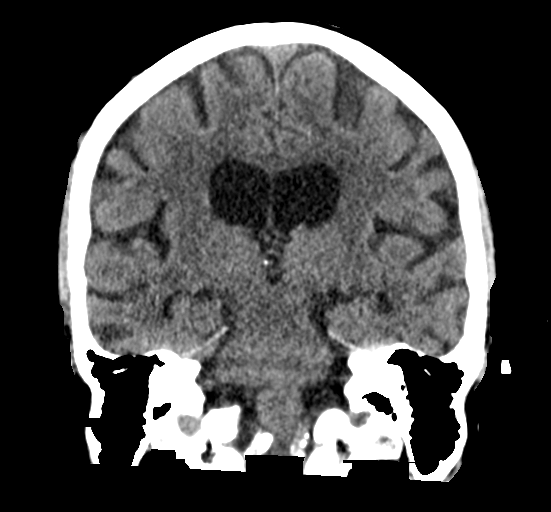

[Series 5: sagittal soft · sagittal · 0.34mm/px · 3 of 57 slices shown]
[im 19/57  brain]
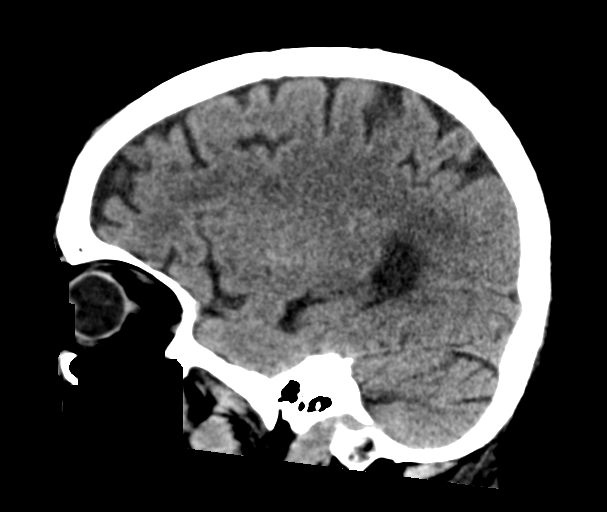
[im 29/57  brain]
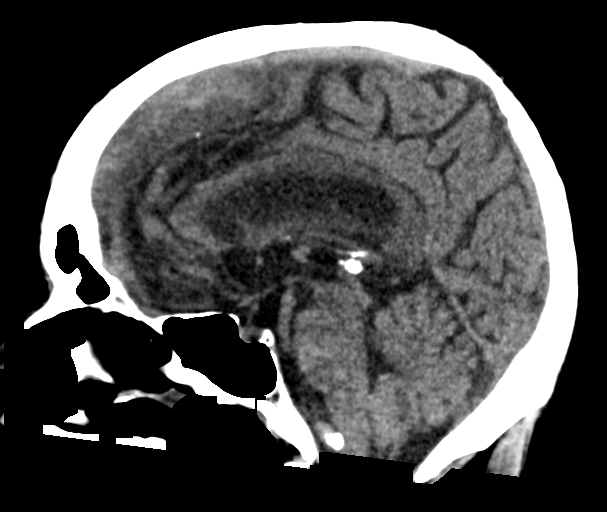
[im 38/57  brain]
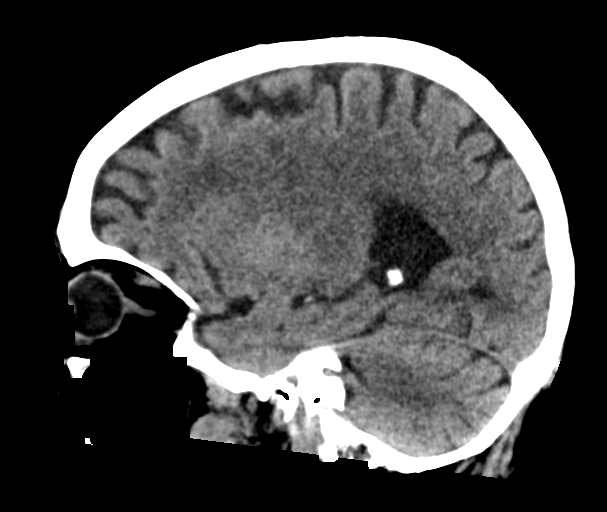

[16 of 47 positions shown; findings below may reference images not displayed]

FINDINGS: CT HEAD FINDINGS

Brain: No evidence of acute intracranial hemorrhage or extra-axial
collection.No evidence of mass lesion/concerning mass effect.The
ventricles are normal in size.Scattered subcortical and
periventricular white matter hypodensities, nonspecific but likely
sequela of chronic small vessel ischemic disease.Mild cerebral
atrophy

Vascular: No hyperdense vessel.

Skull: Negative for skull fracture.

Sinuses/Orbits: No acute finding.

Other: Right frontal scalp soft tissue swelling.

CT CERVICAL SPINE FINDINGS

Alignment: Normal.

Skull base and vertebrae: No evidence of acute cervical spine
fracture. No aggressive osseous lesion.

Soft tissues and spinal canal: No prevertebral fluid or swelling. No
visible canal hematoma.

Disc levels: There is multilevel degenerative disc disease, and
moderate to severe at C6-C7. Mild-to-moderate multilevel facet
arthropathy.

Upper chest: Negative

Other: None.
IMPRESSION: No acute intracranial abnormality. Right frontal scalp soft tissue
swelling.

No acute cervical spine fracture. Multilevel degenerative disc
disease worst at C6-C7. Mild-to-moderate multilevel facet
arthropathy.

## 2021-08-13 IMAGING — CT CT CERVICAL SPINE W/O CM
3 of 4 series · 11 of 33 positions shown, 13 images · non-contrast
Comparison: None.

CLINICAL DATA: Head trauma, moderate-severe; Neck trauma (Age >=
65y)



[Series 5: cor bone · coronal · 0.32mm/px · 3 of 61 slices shown]
[im 13/61  bone]
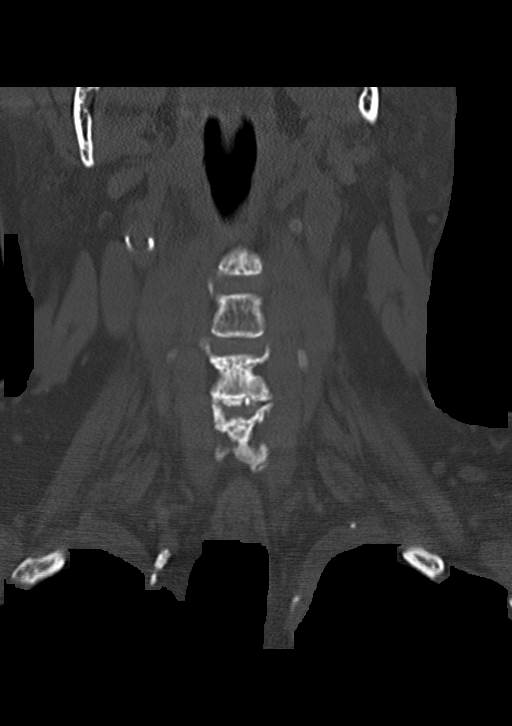
[im 25/61  bone]
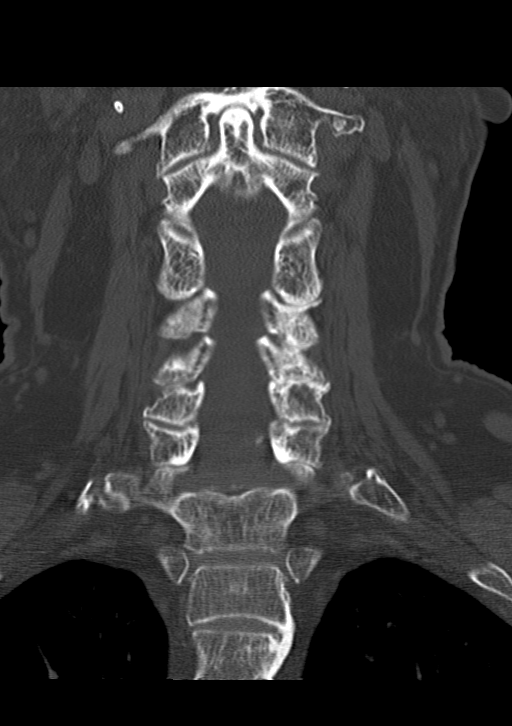
[im 37/61  bone]
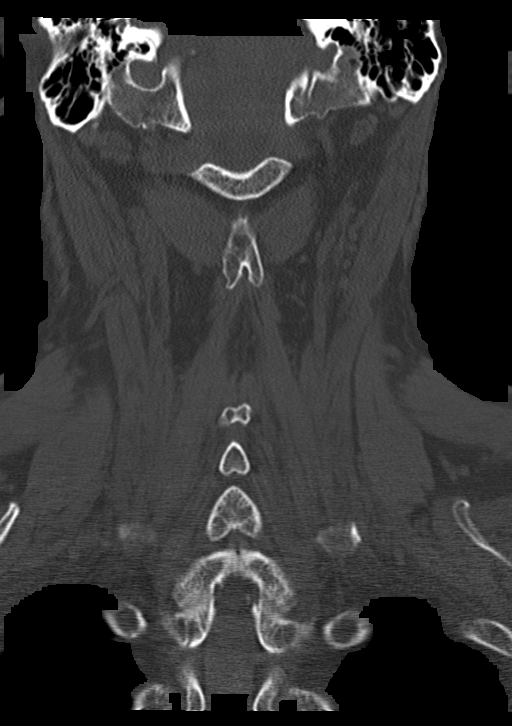

[Series 6: sag bone · sagittal · 0.24mm/px · 5 of 60 slices shown, 6 images]
[im 20/60  bone]
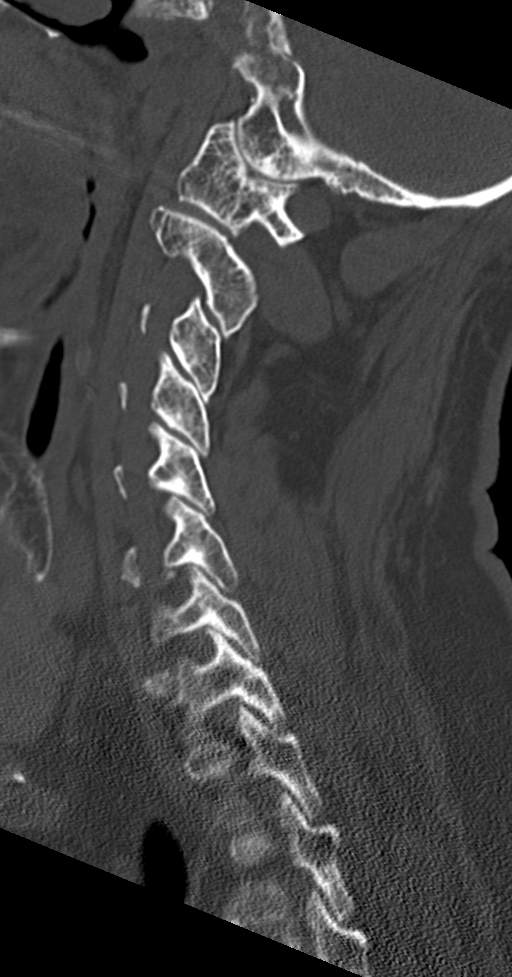
[im 25/60  bone]
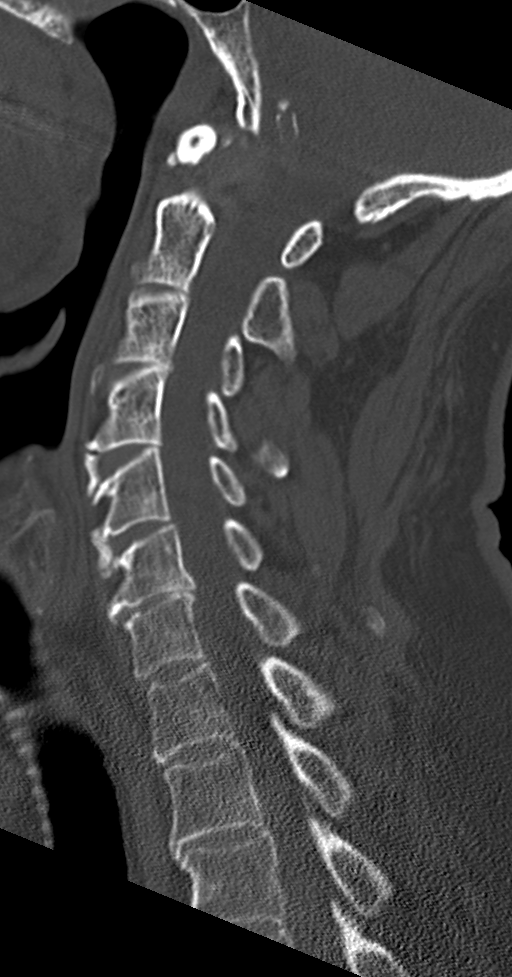
[im 30/60  soft-tissue]
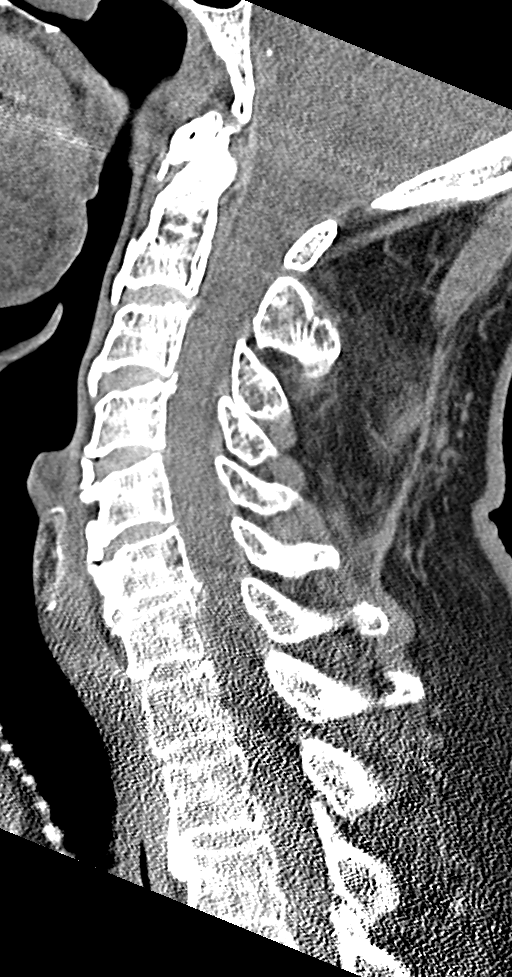
[im 30/60  bone]
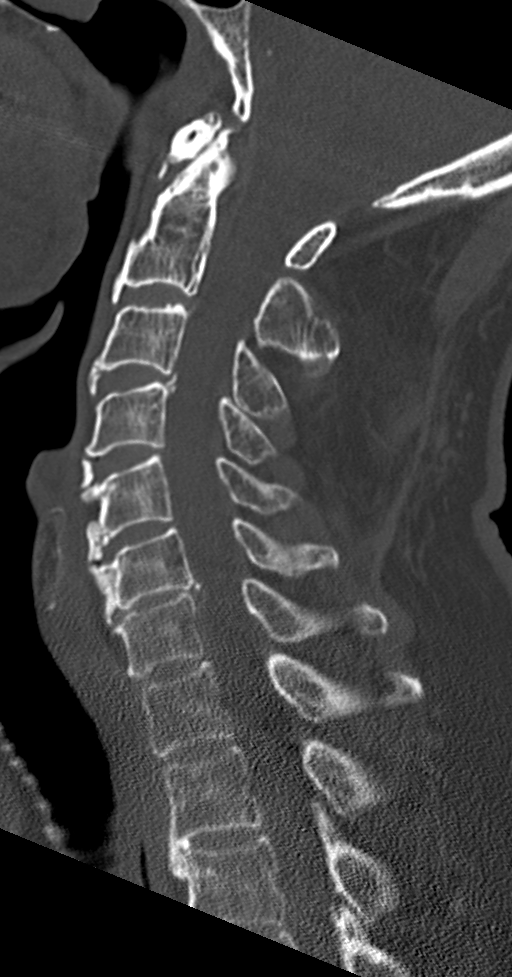
[im 35/60  bone]
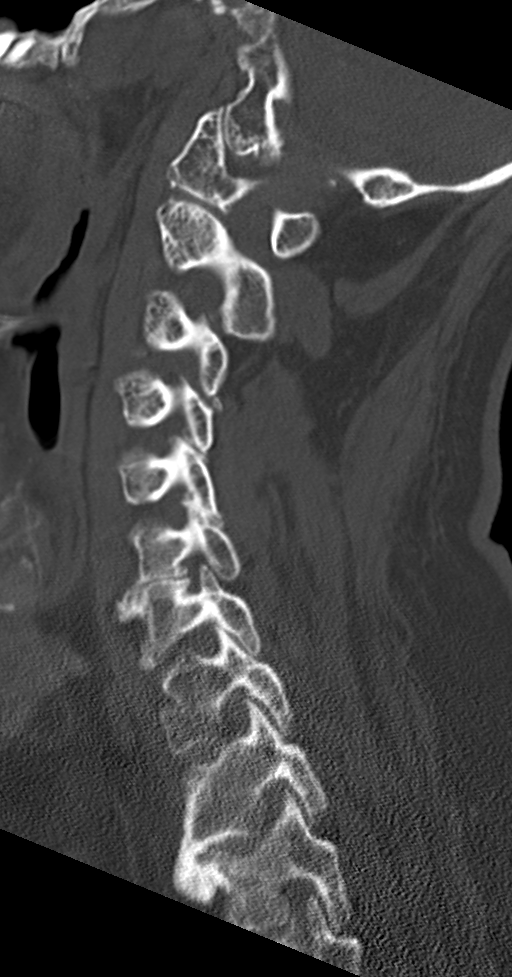
[im 40/60  bone]
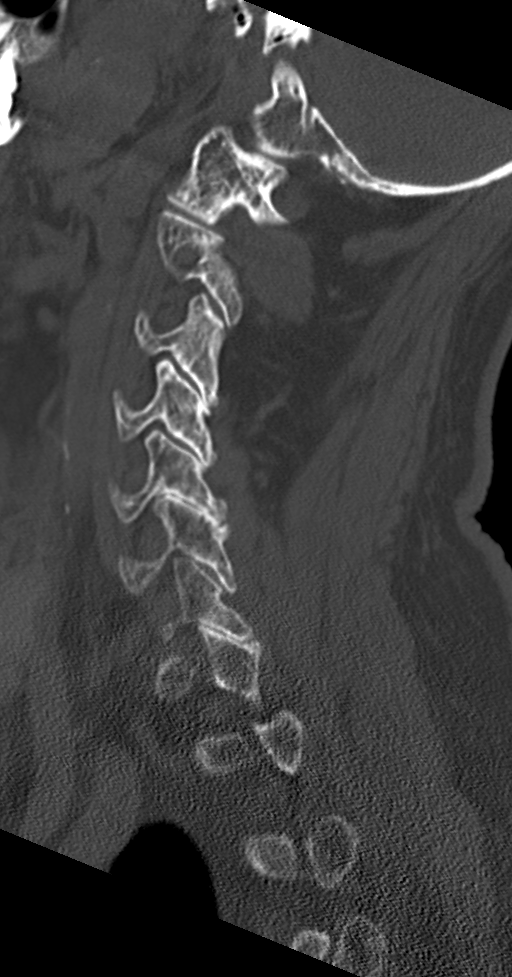

[Series 7: orthogonal axials · axial · 0.22mm/px · z∈[-429,-309]mm · 3 of 96 slices shown, 4 images]
[im 16/96  soft-tissue]
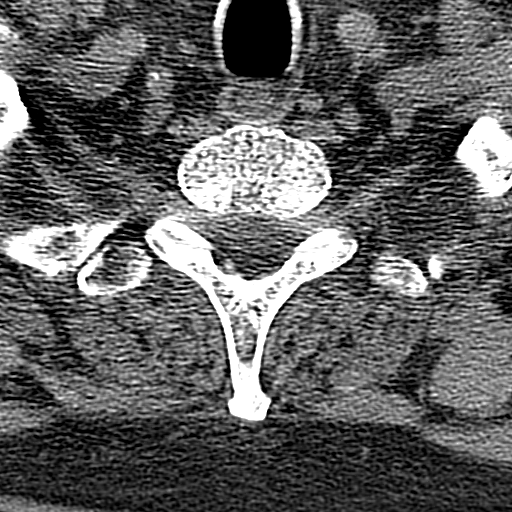
[im 16/96  bone]
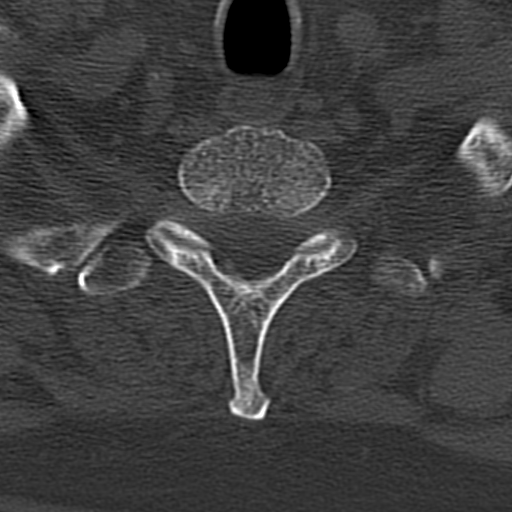
[im 48/96  bone]
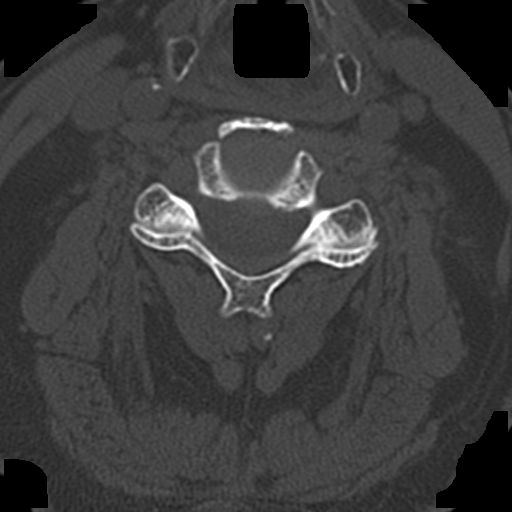
[im 80/96  bone]
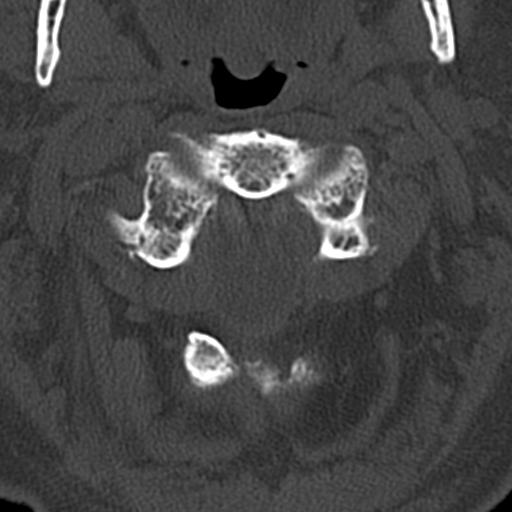

[11 of 33 positions shown; findings below may reference images not displayed]

FINDINGS: CT HEAD FINDINGS

Brain: No evidence of acute intracranial hemorrhage or extra-axial
collection.No evidence of mass lesion/concerning mass effect.The
ventricles are normal in size.Scattered subcortical and
periventricular white matter hypodensities, nonspecific but likely
sequela of chronic small vessel ischemic disease.Mild cerebral
atrophy

Vascular: No hyperdense vessel.

Skull: Negative for skull fracture.

Sinuses/Orbits: No acute finding.

Other: Right frontal scalp soft tissue swelling.

CT CERVICAL SPINE FINDINGS

Alignment: Normal.

Skull base and vertebrae: No evidence of acute cervical spine
fracture. No aggressive osseous lesion.

Soft tissues and spinal canal: No prevertebral fluid or swelling. No
visible canal hematoma.

Disc levels: There is multilevel degenerative disc disease, and
moderate to severe at C6-C7. Mild-to-moderate multilevel facet
arthropathy.

Upper chest: Negative

Other: None.
IMPRESSION: No acute intracranial abnormality. Right frontal scalp soft tissue
swelling.

No acute cervical spine fracture. Multilevel degenerative disc
disease worst at C6-C7. Mild-to-moderate multilevel facet
arthropathy.

## 2021-08-13 NOTE — ED Notes (Signed)
[facility] called for consult  [facility] ?

## 2021-08-13 NOTE — ED Provider Notes (Signed)
?[facility] ?Provider Note ? ? ?CSN: [id number] ?Arrival date & time: 08/13/21  1553 ? ?  ? ?History ? ?Chief Complaint  ?Patient presents with  ? Fall  ? Head Laceration  ? ? ?[name] is a 81 y.o. male with a past medical history significant for recently diagnosed multiple myeloma, recent decreased hemoglobin, from 12 November 2 9 around 6 days ago who presents with mechanical, nonsyncopal fall just prior to arrival.  Patient reports that he has been feeling a little bit more weak recently, likely secondary to his recent diagnosis of multiple myeloma, and he did not lift his feet up off the ground while he was walking, stumbled and fell forward.  He has some scrapes, abrasions of the right knee, right elbow, and left palm as well as right side of the frontal forehead.  Patient denies loss of consciousness.  He denies feeling of chest pain, shortness of breath, heart racing, lightheadedness, dizziness prior to the fall.  His wife does endorse that he was slightly pale when he fell however.  At this time he denies any chest pain, shortness of breath, palpitations.  He has no history of cardiogenic syncope, he does have several stents in place.  Patient does not take any blood thinners.  At this time he denies any headache, nausea, sensitivity to light, confusion.  He is alert and oriented x3. ? ? ?Fall ? ?Head Laceration ? ? ?  ? ?Home Medications ?Prior to Admission medications   ?Medication Sig Start Date End Date Taking? Authorizing Provider  ?Ascorbic Acid (C-1000 SR) 1000 MG TBCR Take 1 tablet by mouth daily. 07/29/18   [provider]  ?aspirin EC 81 MG tablet Take 81 mg by mouth daily.    [provider]  ?BYSTOLIC 20 MG TABS Take 10 mg by mouth daily. daily    [provider]  ?Finerenone (KERENDIA) 10 MG TABS Take 1 tablet by mouth daily. 06/27/21   Adrian Prows, MD  ?glimepiride (AMARYL) 2 MG tablet Take 1 tablet (2 mg total) by mouth daily with  breakfast. Reduce the dose to half until  resumed by your primary care doctor 12/02/18   Antonieta Pert, MD  ?isosorbide mononitrate (IMDUR) 60 MG 24 hr tablet Take 1 tablet (60 mg total) by mouth daily. 06/27/21   Adrian Prows, MD  ?neomycin-polymyxin-dexamethasone (MAXITROL) 0.1 % ophthalmic suspension at bedtime.    [provider]  ?nitroGLYCERIN (NITROSTAT) 0.4 MG SL tablet Place 1 tablet (0.4 mg total) under the tongue every 5 (five) minutes x 3 doses as needed for chest pain. 06/27/21   Adrian Prows, MD  ?Olmesartan-amLODIPine-HCTZ 20-5-12.5 MG TABS Take 1 tablet by mouth daily. 06/27/21   Adrian Prows, MD  ?rosuvastatin (CRESTOR) 20 MG tablet Take 1 tablet (20 mg total) by mouth at bedtime. 06/27/21   Adrian Prows, MD  ?   ? ?Allergies    ?Sulfa antibiotics, Morphine and related, and Lisinopril   ? ?Review of Systems   ?Review of Systems  ?Musculoskeletal:  Positive for neck pain.  ?Skin:  Positive for wound.  ?Neurological:  Positive for weakness.  ?All other systems reviewed and are negative. ? ?Physical Exam ?Updated Vital Signs ?BP 126/62 (BP Location: Right Arm)   Pulse (!) 57   Temp 98.1 ?F (36.7 ?C) (Oral)   Resp 19   Ht 6' (1.829 m)   Wt 83 kg   SpO2 98%   BMI 24.82 kg/m?  ?Physical Exam ?Vitals and nursing  note reviewed.  ?Constitutional:   ?   General: He is not in acute distress. ?   Appearance: Normal appearance.  ?HENT:  ?   Head: Normocephalic and atraumatic.  ?Eyes:  ?   General:     ?   Right eye: No discharge.     ?   Left eye: No discharge.  ?Cardiovascular:  ?   Rate and Rhythm: Normal rate and regular rhythm.  ?   Heart sounds: No murmur heard. ?  No friction rub. No gallop.  ?Pulmonary:  ?   Effort: Pulmonary effort is normal.  ?   Breath sounds: Normal breath sounds.  ?Abdominal:  ?   General: Bowel sounds are normal.  ?   Palpations: Abdomen is soft.  ?Musculoskeletal:  ?   Comments: Intact strength 5 out of 5 bilateral upper and lower extremities, intact range of motion especially of  the right knee, no varus, valgus laxity, no laxity with Lachman's, posterior drawer.  No evidence of joint effusion.  Intact range of motion, no step-offs or deformity noted of the right elbow.  No significant bony tenderness of the left palm, no step-offs or deformity noted.  Intact strength of all of the digits of the left hand. ? ?No midline spinal tenderness noted out cervical, thoracic, lumbar spines.  Intact range of motion at the cervical, thoracic, lumbar spines.  ?Skin: ?   General: Skin is warm and dry.  ?   Capillary Refill: Capillary refill takes less than 2 seconds.  ?   Comments: Some abrasions without evidence of foreign body noted to the right knee, right elbow, left palm, and right frontal forehead.  No evidence of deeper laceration.  Some soft tissue swelling versus hematoma noted right frontal forehead.  ?Neurological:  ?   Mental Status: He is alert and oriented to person, place, and time.  ?   Comments: Cranial nerves II through XII grossly intact.  Intact finger-nose, intact heel-to-shin.  Romberg negative, gait normal.  Alert and oriented x3.  Moves all 4 limbs spontaneously, normal coordination.  No pronator drift.  Intact strength 5 out of 5 bilateral upper and lower extremities.  ?Psychiatric:     ?   Mood and Affect: Mood normal.     ?   Behavior: Behavior normal.  ? ? ?ED Results / Procedures / Treatments   ?Labs ?(all labs ordered are listed, but only abnormal results are displayed) ?Labs Reviewed  ?CBC - Abnormal; Notable for the following components:  ?    Result Value  ? RBC 2.95 (*)   ? Hemoglobin 8.4 (*)   ? HCT 25.7 (*)   ? All other components within normal limits  ?BASIC METABOLIC PANEL - Abnormal; Notable for the following components:  ? CO2 19 (*)   ? Glucose, Bld 173 (*)   ? BUN 61 (*)   ? Creatinine, Ser 4.22 (*)   ? Calcium 8.7 (*)   ? GFR, Estimated 13 (*)   ? All other components within normal limits  ?VITAMIN B12  ?FOLATE  ?IRON AND TIBC  ?FERRITIN  ?RETICULOCYTES   ?PROTEIN ELECTROPHORESIS, SERUM  ? ? ?EKG ?EKG Interpretation ? ?Date/Time:  Monday August 13 2021 15:57:26 EDT ?Ventricular Rate:  71 ?PR Interval:  231 ?QRS Duration: 95 ?QT Interval:  417 ?QTC Calculation: 867 ?R Axis:   39 ?Text Interpretation: Sinus rhythm Prolonged PR interval Abnormal R-wave progression, early transition Probable anteroseptal infarct, old since last tracing no significant change Confirmed by  Malvin Johns [phone]) on 08/13/2021 5:16:20 PM ? ?Radiology ?CT Head Wo Contrast ? ?Result Date: 08/13/2021 ?CLINICAL DATA:  Head trauma, moderate-severe; Neck trauma (Age >= 65y) EXAM: CT HEAD WITHOUT CONTRAST CT CERVICAL SPINE WITHOUT CONTRAST TECHNIQUE: Multidetector CT imaging of the head and cervical spine was performed following the standard protocol without intravenous contrast. Multiplanar CT image reconstructions of the cervical spine were also generated. RADIATION DOSE REDUCTION: This exam was performed according to the departmental dose-optimization program which includes automated exposure control, adjustment of the mA and/or kV according to patient size and/or use of iterative reconstruction technique. COMPARISON:  None. FINDINGS: CT HEAD FINDINGS Brain: No evidence of acute intracranial hemorrhage or extra-axial collection.No evidence of mass lesion/concerning mass effect.The ventricles are normal in size.Scattered subcortical and periventricular white matter hypodensities, nonspecific but likely sequela of chronic small vessel ischemic disease.Mild cerebral atrophy Vascular: No hyperdense vessel. Skull: Negative for skull fracture. Sinuses/Orbits: No acute finding. Other: Right frontal scalp soft tissue swelling. CT CERVICAL SPINE FINDINGS Alignment: Normal. Skull base and vertebrae: No evidence of acute cervical spine fracture. No aggressive osseous lesion. Soft tissues and spinal canal: No prevertebral fluid or swelling. No visible canal hematoma. Disc levels: There is multilevel  degenerative disc disease, and moderate to severe at C6-C7. Mild-to-moderate multilevel facet arthropathy. Upper chest: Negative Other: None. IMPRESSION: No acute intracranial abnormality. Right frontal scalp soft tissue swelli

## 2021-08-13 NOTE — Discharge Instructions (Signed)
As we discussed I recommend that you follow-up with your primary care doctor and your hematologist/oncologist in the next few days.  In terms of your wound care recommend cleaning the wounds at least once to twice daily, applying a thin layer of antibacterial ointment such as Polysporin, Neosporin to your abrasions, and monitoring for signs of infections including worsening pain, redness, pus draining from the affected sites. ? ?With respect to your anemia you can begin taking iron supplementation, as well as B vitamin while we wait to assess the source of your anemia.  If you have worsening symptoms, fatigue, feeling of lightheadedness, or more falls please return to the emergency department. ?

## 2021-08-13 NOTE — ED Triage Notes (Signed)
Pt to ED via EMS c/o fall . Reports mechanical fall, [place] [employer]. Abrasions noted to right elbow, right knee. Abrasion/hematoma to right forehead.  Recently dx with leukemia. No LOC. Not on bloodthinners. No medications given by EMS. Last VS: 135/66 P 67, 100%RA. CBG. 211 ?

## 2021-08-16 LAB — PROTEIN ELECTROPHORESIS, SERUM
A/G Ratio: 1.3 (ref 0.7–1.7)
Albumin ELP: 3.1 g/dL (ref 2.9–4.4)
Alpha-1-Globulin: 0.3 g/dL (ref 0.0–0.4)
Alpha-2-Globulin: 0.6 g/dL (ref 0.4–1.0)
Beta Globulin: 0.7 g/dL (ref 0.7–1.3)
Gamma Globulin: 0.8 g/dL (ref 0.4–1.8)
Globulin, Total: 2.4 g/dL (ref 2.2–3.9)
M-Spike, %: 0.2 g/dL — ABNORMAL HIGH
Total Protein ELP: 5.5 g/dL — ABNORMAL LOW (ref 6.0–8.5)

## 2021-08-30 ENCOUNTER — Other Ambulatory Visit: Payer: Self-pay | Admitting: Nephrology

## 2021-08-30 DIAGNOSIS — N179 Acute kidney failure, unspecified: Secondary | ICD-10-CM

## 2021-09-06 ENCOUNTER — Ambulatory Visit
Admission: RE | Admit: 2021-09-06 | Discharge: 2021-09-06 | Disposition: A | Discharge status: Home / Self Care | Payer: Medicare Other | Source: Ambulatory Visit | Attending: Nephrology | Admitting: Nephrology

## 2021-09-06 DIAGNOSIS — N179 Acute kidney failure, unspecified: Secondary | ICD-10-CM

## 2021-10-08 ENCOUNTER — Encounter (HOSPITAL_COMMUNITY): Payer: Self-pay

## 2021-10-08 ENCOUNTER — Inpatient Hospital Stay (HOSPITAL_COMMUNITY): Payer: Medicare Other

## 2021-10-08 ENCOUNTER — Other Ambulatory Visit: Payer: Self-pay

## 2021-10-08 ENCOUNTER — Emergency Department (HOSPITAL_COMMUNITY): Payer: Medicare Other

## 2021-10-08 ENCOUNTER — Inpatient Hospital Stay (HOSPITAL_COMMUNITY)
Admission: EM | Admit: 2021-10-08 | Discharge: 2021-10-22 | DRG: 682 | Disposition: A | Discharge status: Hospice | Payer: Medicare Other | Attending: Internal Medicine | Admitting: Internal Medicine

## 2021-10-08 DIAGNOSIS — Z885 Allergy status to narcotic agent status: Secondary | ICD-10-CM

## 2021-10-08 DIAGNOSIS — R5383 Other fatigue: Secondary | ICD-10-CM | POA: Diagnosis not present

## 2021-10-08 DIAGNOSIS — Z515 Encounter for palliative care: Secondary | ICD-10-CM | POA: Diagnosis not present

## 2021-10-08 DIAGNOSIS — T796XXA Traumatic ischemia of muscle, initial encounter: Secondary | ICD-10-CM | POA: Diagnosis not present

## 2021-10-08 DIAGNOSIS — Z20822 Contact with and (suspected) exposure to covid-19: Secondary | ICD-10-CM | POA: Diagnosis present

## 2021-10-08 DIAGNOSIS — K219 Gastro-esophageal reflux disease without esophagitis: Secondary | ICD-10-CM | POA: Diagnosis present

## 2021-10-08 DIAGNOSIS — I10 Essential (primary) hypertension: Secondary | ICD-10-CM | POA: Diagnosis present

## 2021-10-08 DIAGNOSIS — C9001 Multiple myeloma in remission: Secondary | ICD-10-CM | POA: Diagnosis present

## 2021-10-08 DIAGNOSIS — S32049A Unspecified fracture of fourth lumbar vertebra, initial encounter for closed fracture: Secondary | ICD-10-CM | POA: Diagnosis present

## 2021-10-08 DIAGNOSIS — L89892 Pressure ulcer of other site, stage 2: Secondary | ICD-10-CM | POA: Diagnosis present

## 2021-10-08 DIAGNOSIS — I129 Hypertensive chronic kidney disease with stage 1 through stage 4 chronic kidney disease, or unspecified chronic kidney disease: Secondary | ICD-10-CM | POA: Diagnosis present

## 2021-10-08 DIAGNOSIS — E870 Hyperosmolality and hypernatremia: Secondary | ICD-10-CM | POA: Diagnosis present

## 2021-10-08 DIAGNOSIS — Z905 Acquired absence of kidney: Secondary | ICD-10-CM

## 2021-10-08 DIAGNOSIS — E1169 Type 2 diabetes mellitus with other specified complication: Secondary | ICD-10-CM | POA: Diagnosis present

## 2021-10-08 DIAGNOSIS — N179 Acute kidney failure, unspecified: Principal | ICD-10-CM | POA: Diagnosis present

## 2021-10-08 DIAGNOSIS — Z7982 Long term (current) use of aspirin: Secondary | ICD-10-CM

## 2021-10-08 DIAGNOSIS — G9341 Metabolic encephalopathy: Secondary | ICD-10-CM | POA: Diagnosis present

## 2021-10-08 DIAGNOSIS — Y92009 Unspecified place in unspecified non-institutional (private) residence as the place of occurrence of the external cause: Secondary | ICD-10-CM | POA: Diagnosis not present

## 2021-10-08 DIAGNOSIS — F0394 Unspecified dementia, unspecified severity, with anxiety: Secondary | ICD-10-CM | POA: Diagnosis present

## 2021-10-08 DIAGNOSIS — Z8249 Family history of ischemic heart disease and other diseases of the circulatory system: Secondary | ICD-10-CM

## 2021-10-08 DIAGNOSIS — S32009A Unspecified fracture of unspecified lumbar vertebra, initial encounter for closed fracture: Secondary | ICD-10-CM

## 2021-10-08 DIAGNOSIS — S32028A Other fracture of second lumbar vertebra, initial encounter for closed fracture: Secondary | ICD-10-CM

## 2021-10-08 DIAGNOSIS — E872 Acidosis, unspecified: Secondary | ICD-10-CM | POA: Diagnosis present

## 2021-10-08 DIAGNOSIS — R1310 Dysphagia, unspecified: Secondary | ICD-10-CM | POA: Diagnosis present

## 2021-10-08 DIAGNOSIS — I252 Old myocardial infarction: Secondary | ICD-10-CM

## 2021-10-08 DIAGNOSIS — W19XXXA Unspecified fall, initial encounter: Secondary | ICD-10-CM | POA: Diagnosis not present

## 2021-10-08 DIAGNOSIS — Y92002 Bathroom of unspecified non-institutional (private) residence single-family (private) house as the place of occurrence of the external cause: Secondary | ICD-10-CM | POA: Diagnosis not present

## 2021-10-08 DIAGNOSIS — Z66 Do not resuscitate: Secondary | ICD-10-CM | POA: Diagnosis present

## 2021-10-08 DIAGNOSIS — S32029A Unspecified fracture of second lumbar vertebra, initial encounter for closed fracture: Secondary | ICD-10-CM | POA: Diagnosis present

## 2021-10-08 DIAGNOSIS — E1122 Type 2 diabetes mellitus with diabetic chronic kidney disease: Secondary | ICD-10-CM | POA: Diagnosis present

## 2021-10-08 DIAGNOSIS — Z7984 Long term (current) use of oral hypoglycemic drugs: Secondary | ICD-10-CM

## 2021-10-08 DIAGNOSIS — N184 Chronic kidney disease, stage 4 (severe): Secondary | ICD-10-CM | POA: Diagnosis present

## 2021-10-08 DIAGNOSIS — L89100 Pressure ulcer of unspecified part of back, unstageable: Secondary | ICD-10-CM | POA: Diagnosis present

## 2021-10-08 DIAGNOSIS — Z888 Allergy status to other drugs, medicaments and biological substances status: Secondary | ICD-10-CM

## 2021-10-08 DIAGNOSIS — Z833 Family history of diabetes mellitus: Secondary | ICD-10-CM

## 2021-10-08 DIAGNOSIS — I25118 Atherosclerotic heart disease of native coronary artery with other forms of angina pectoris: Secondary | ICD-10-CM | POA: Diagnosis present

## 2021-10-08 DIAGNOSIS — L8990 Pressure ulcer of unspecified site, unspecified stage: Secondary | ICD-10-CM | POA: Insufficient documentation

## 2021-10-08 DIAGNOSIS — R7989 Other specified abnormal findings of blood chemistry: Secondary | ICD-10-CM

## 2021-10-08 DIAGNOSIS — W06XXXA Fall from bed, initial encounter: Secondary | ICD-10-CM | POA: Diagnosis present

## 2021-10-08 DIAGNOSIS — D631 Anemia in chronic kidney disease: Secondary | ICD-10-CM | POA: Diagnosis present

## 2021-10-08 DIAGNOSIS — M6282 Rhabdomyolysis: Secondary | ICD-10-CM

## 2021-10-08 DIAGNOSIS — D509 Iron deficiency anemia, unspecified: Secondary | ICD-10-CM | POA: Diagnosis present

## 2021-10-08 DIAGNOSIS — L89311 Pressure ulcer of right buttock, stage 1: Secondary | ICD-10-CM | POA: Diagnosis present

## 2021-10-08 DIAGNOSIS — L89321 Pressure ulcer of left buttock, stage 1: Secondary | ICD-10-CM | POA: Diagnosis present

## 2021-10-08 DIAGNOSIS — Z955 Presence of coronary angioplasty implant and graft: Secondary | ICD-10-CM

## 2021-10-08 DIAGNOSIS — R627 Adult failure to thrive: Secondary | ICD-10-CM | POA: Diagnosis present

## 2021-10-08 DIAGNOSIS — C229 Malignant neoplasm of liver, not specified as primary or secondary: Secondary | ICD-10-CM | POA: Diagnosis present

## 2021-10-08 DIAGNOSIS — Z882 Allergy status to sulfonamides status: Secondary | ICD-10-CM

## 2021-10-08 DIAGNOSIS — Z23 Encounter for immunization: Secondary | ICD-10-CM | POA: Diagnosis present

## 2021-10-08 DIAGNOSIS — E785 Hyperlipidemia, unspecified: Secondary | ICD-10-CM | POA: Diagnosis present

## 2021-10-08 DIAGNOSIS — D472 Monoclonal gammopathy: Secondary | ICD-10-CM | POA: Diagnosis present

## 2021-10-08 DIAGNOSIS — Z79899 Other long term (current) drug therapy: Secondary | ICD-10-CM

## 2021-10-08 DIAGNOSIS — E860 Dehydration: Secondary | ICD-10-CM | POA: Diagnosis present

## 2021-10-08 DIAGNOSIS — M109 Gout, unspecified: Secondary | ICD-10-CM | POA: Diagnosis present

## 2021-10-08 DIAGNOSIS — Z8546 Personal history of malignant neoplasm of prostate: Secondary | ICD-10-CM

## 2021-10-08 DIAGNOSIS — R778 Other specified abnormalities of plasma proteins: Secondary | ICD-10-CM | POA: Diagnosis present

## 2021-10-08 DIAGNOSIS — Z85528 Personal history of other malignant neoplasm of kidney: Secondary | ICD-10-CM

## 2021-10-08 DIAGNOSIS — Z7189 Other specified counseling: Secondary | ICD-10-CM | POA: Diagnosis not present

## 2021-10-08 DIAGNOSIS — Z6824 Body mass index (BMI) 24.0-24.9, adult: Secondary | ICD-10-CM

## 2021-10-08 DIAGNOSIS — R160 Hepatomegaly, not elsewhere classified: Secondary | ICD-10-CM | POA: Diagnosis not present

## 2021-10-08 DIAGNOSIS — E875 Hyperkalemia: Secondary | ICD-10-CM | POA: Diagnosis present

## 2021-10-08 DIAGNOSIS — D72829 Elevated white blood cell count, unspecified: Secondary | ICD-10-CM | POA: Diagnosis present

## 2021-10-08 LAB — COMPREHENSIVE METABOLIC PANEL
ALT: 84 U/L — ABNORMAL HIGH (ref 0–44)
AST: 165 U/L — ABNORMAL HIGH (ref 15–41)
Albumin: 2.8 g/dL — ABNORMAL LOW (ref 3.5–5.0)
Alkaline Phosphatase: 342 U/L — ABNORMAL HIGH (ref 38–126)
Anion gap: 11 (ref 5–15)
BUN: 48 mg/dL — ABNORMAL HIGH (ref 8–23)
CO2: 19 mmol/L — ABNORMAL LOW (ref 22–32)
Calcium: 8.5 mg/dL — ABNORMAL LOW (ref 8.9–10.3)
Chloride: 110 mmol/L (ref 98–111)
Creatinine, Ser: 3.15 mg/dL — ABNORMAL HIGH (ref 0.61–1.24)
GFR, Estimated: 19 mL/min — ABNORMAL LOW (ref >60)
Glucose, Bld: 260 mg/dL — ABNORMAL HIGH (ref 70–99)
Potassium: 4.8 mmol/L (ref 3.5–5.1)
Sodium: 140 mmol/L (ref 135–145)
Total Bilirubin: 1.2 mg/dL (ref 0.3–1.2)
Total Protein: 6.5 g/dL (ref 6.5–8.1)

## 2021-10-08 LAB — CBC WITH DIFFERENTIAL/PLATELET
Abs Immature Granulocytes: 0.11 10*3/uL — ABNORMAL HIGH (ref 0.00–0.07)
Basophils Absolute: 0 10*3/uL (ref 0.0–0.1)
Basophils Relative: 0 %
Eosinophils Absolute: 0 10*3/uL (ref 0.0–0.5)
Eosinophils Relative: 0 %
HCT: 34.3 % — ABNORMAL LOW (ref 39.0–52.0)
Hemoglobin: 11.2 g/dL — ABNORMAL LOW (ref 13.0–17.0)
Immature Granulocytes: 1 %
Lymphocytes Relative: 4 %
Lymphs Abs: 0.6 10*3/uL — ABNORMAL LOW (ref 0.7–4.0)
MCH: 28.2 pg (ref 26.0–34.0)
MCHC: 32.7 g/dL (ref 30.0–36.0)
MCV: 86.4 fL (ref 80.0–100.0)
Monocytes Absolute: 1.7 10*3/uL — ABNORMAL HIGH (ref 0.1–1.0)
Monocytes Relative: 11 %
Neutro Abs: 13.2 10*3/uL — ABNORMAL HIGH (ref 1.7–7.7)
Neutrophils Relative %: 84 %
Platelets: 127 10*3/uL — ABNORMAL LOW (ref 150–400)
RBC: 3.97 MIL/uL — ABNORMAL LOW (ref 4.22–5.81)
RDW: 15.3 % (ref 11.5–15.5)
WBC: 15.6 10*3/uL — ABNORMAL HIGH (ref 4.0–10.5)
nRBC: 0 % (ref 0.0–0.2)

## 2021-10-08 LAB — HEMOGLOBIN A1C
Hgb A1c MFr Bld: 8.8 % — ABNORMAL HIGH (ref 4.8–5.6)
Mean Plasma Glucose: 205.86 mg/dL

## 2021-10-08 LAB — GLUCOSE, CAPILLARY
Glucose-Capillary: 210 mg/dL — ABNORMAL HIGH (ref 70–99)
Glucose-Capillary: 201 mg/dL — ABNORMAL HIGH (ref 70–99)

## 2021-10-08 LAB — RESP PANEL BY RT-PCR (FLU A&B, COVID) ARPGX2
Influenza A by PCR: NEGATIVE
Influenza B by PCR: NEGATIVE
SARS Coronavirus 2 by RT PCR: NEGATIVE

## 2021-10-08 LAB — TROPONIN I (HIGH SENSITIVITY)
Troponin I (High Sensitivity): 61 ng/L — ABNORMAL HIGH (ref <18)
Troponin I (High Sensitivity): 66 ng/L — ABNORMAL HIGH (ref <18)

## 2021-10-08 LAB — CBG MONITORING, ED: Glucose-Capillary: 234 mg/dL — ABNORMAL HIGH (ref 70–99)

## 2021-10-08 LAB — CK: Total CK: 3434 U/L — ABNORMAL HIGH (ref 49–397)

## 2021-10-08 IMAGING — CT CT CERVICAL SPINE W/O CM
3 of 4 series · 13 of 33 positions shown, 16 images · non-contrast
Comparison: [DATE].

CLINICAL DATA: Fell in bathroom this morning. Low back pain.
Patient reportedly did not hit his head.



[Series 6: orthogonal bone · axial · 0.34mm/px · z∈[-321,-185]mm · 5 of 105 slices shown, 7 images]
[im 18/105  soft-tissue]
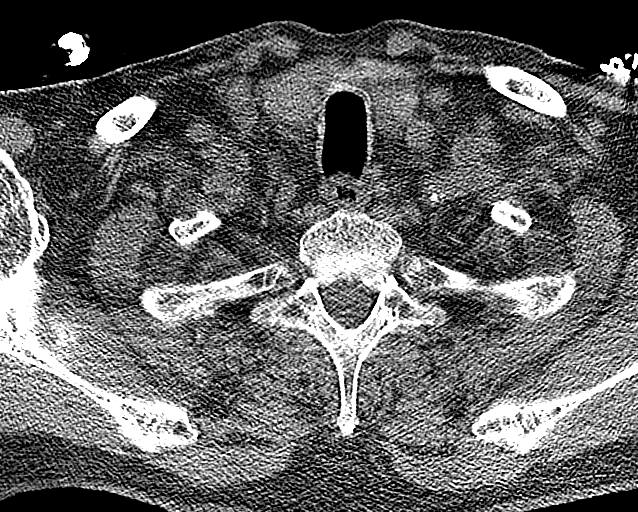
[im 18/105  bone]
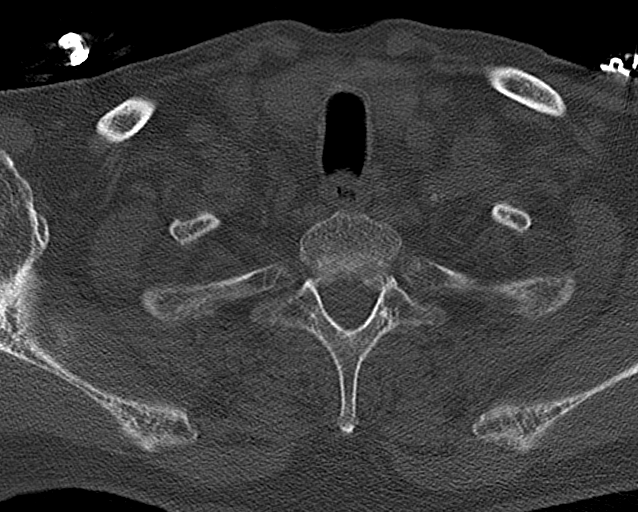
[im 35/105  bone]
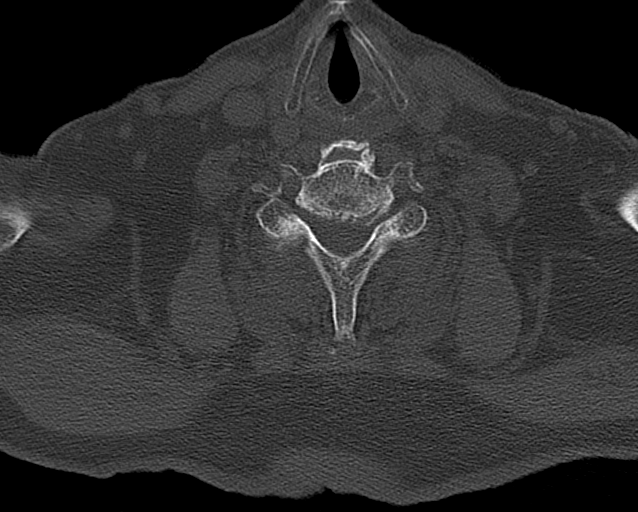
[im 53/105  bone]
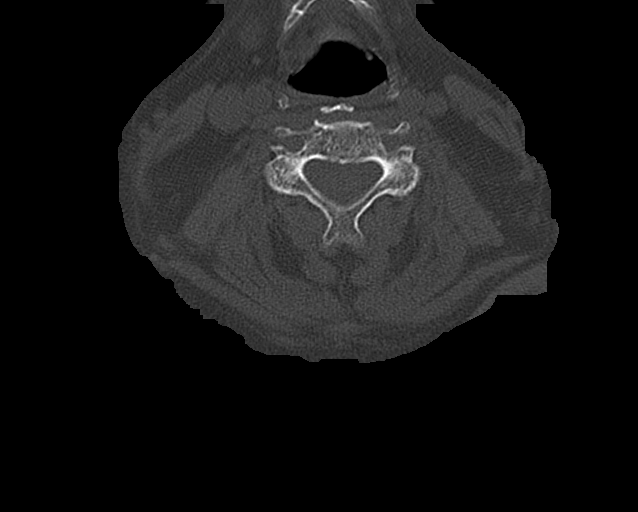
[im 70/105  bone]
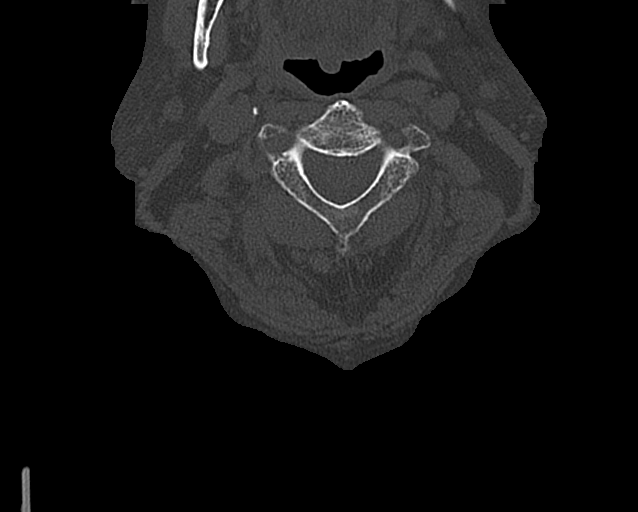
[im 87/105  soft-tissue]
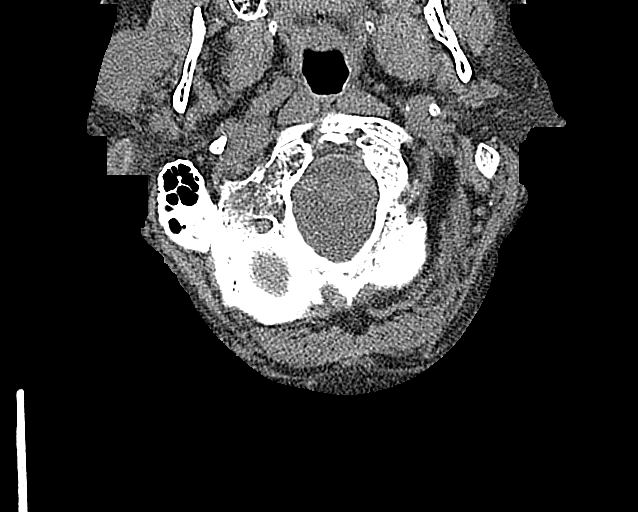
[im 87/105  bone]
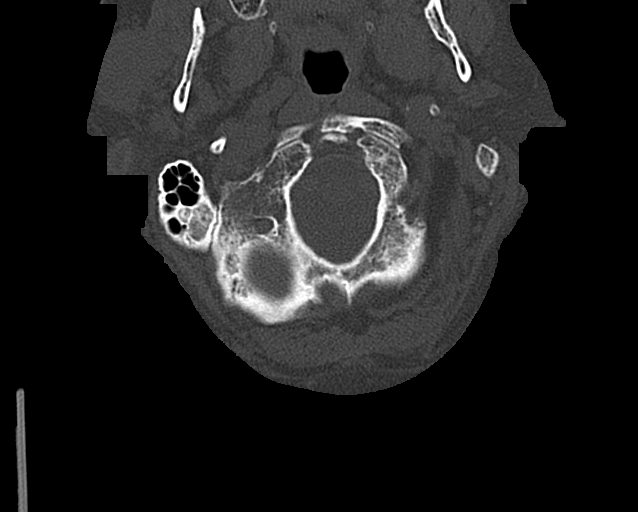

[Series 7: coronal bone · coronal · 0.44mm/px · 3 of 100 slices shown]
[im 20/100  bone]
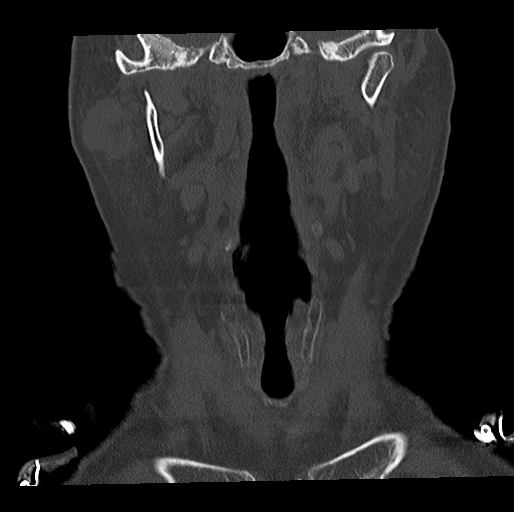
[im 40/100  bone]
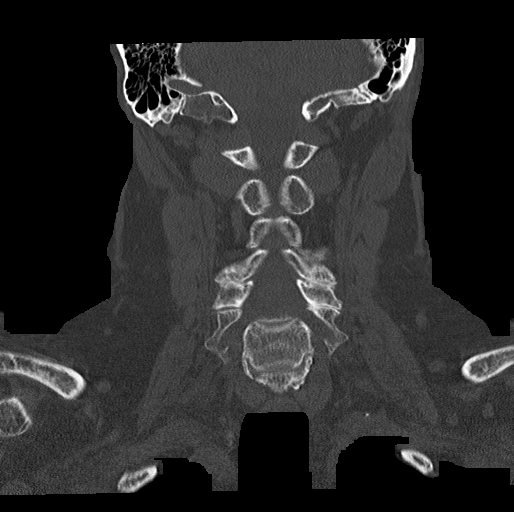
[im 60/100  bone]
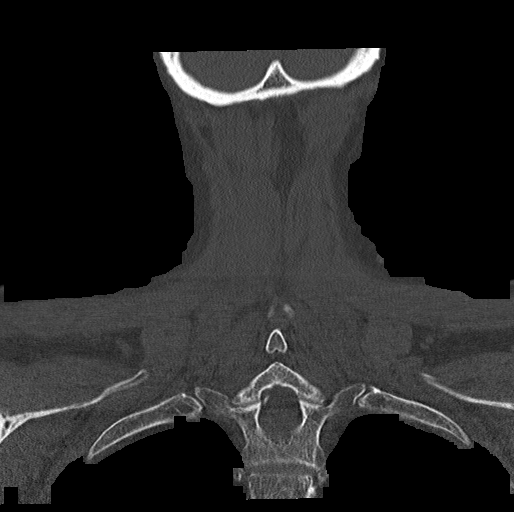

[Series 8: sagittal bone · sagittal · 0.37mm/px · 5 of 84 slices shown, 6 images]
[im 28/84  bone]
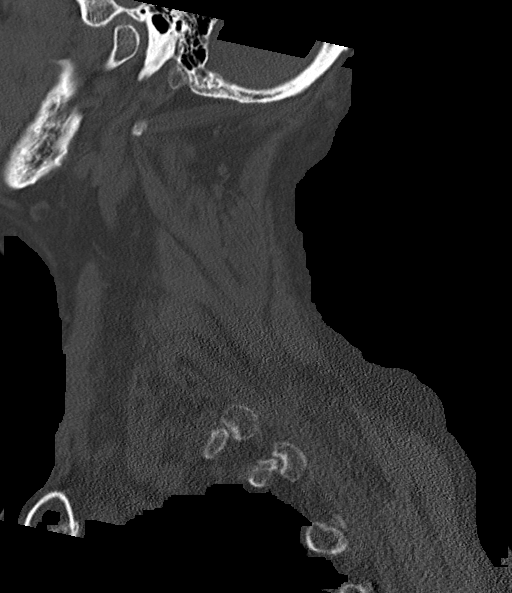
[im 35/84  bone]
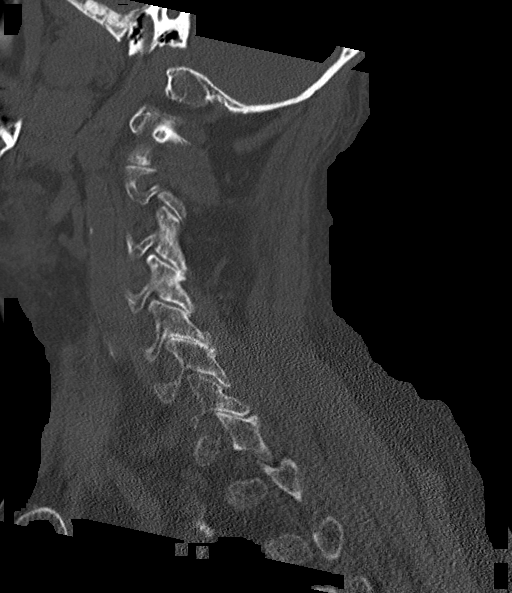
[im 42/84  soft-tissue]
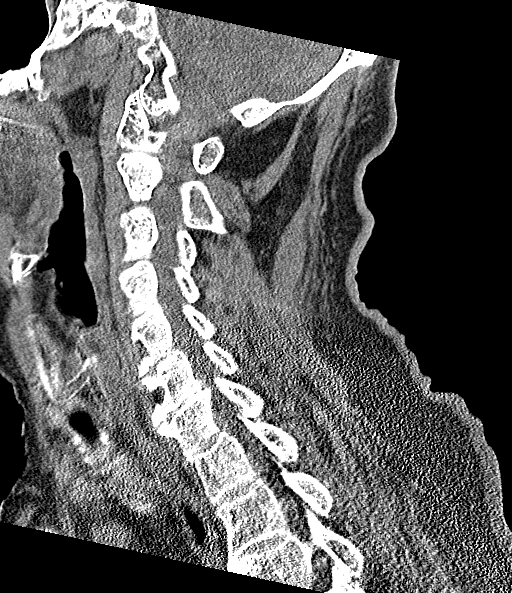
[im 42/84  bone]
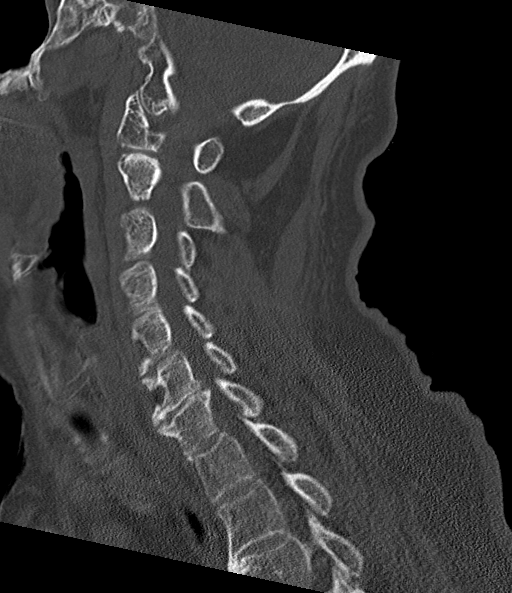
[im 49/84  bone]
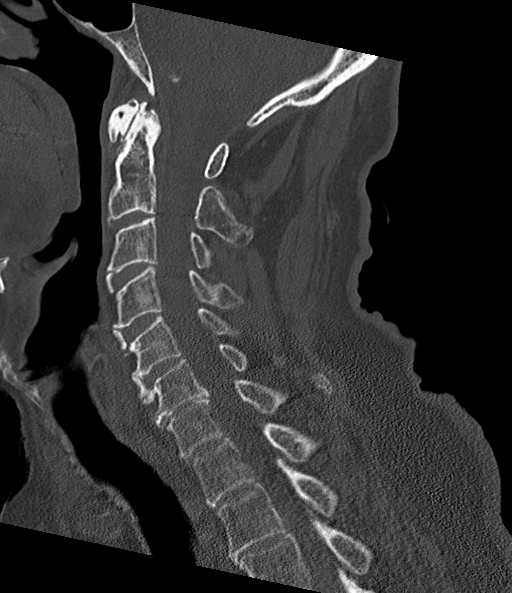
[im 56/84  bone]
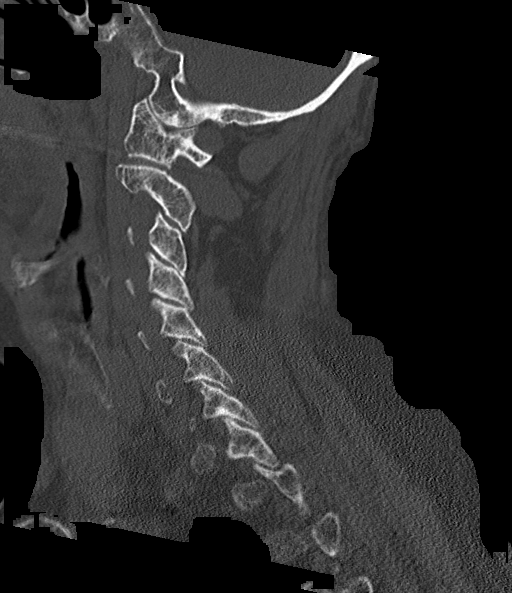

[13 of 33 positions shown; findings below may reference images not displayed]

FINDINGS: CT HEAD FINDINGS

Brain: No evidence of acute infarction, hemorrhage, hydrocephalus,
extra-axial collection or mass lesion/mass effect.

Vascular: No hyperdense vessel or unexpected calcification.

Skull: Normal. Negative for fracture or focal lesion.

Sinuses/Orbits: Globes and orbits are unremarkable. Sinuses are
clear.

Other: None.

CT CERVICAL SPINE FINDINGS

Alignment: Normal.

Skull base and vertebrae: No acute fracture. No primary bone lesion
or focal pathologic process.

Soft tissues and spinal canal: No prevertebral fluid or swelling. No
visible canal hematoma.

Disc levels: Minor loss of disc height at C5-C6. Moderate loss of
disc height at C6-C7. Endplate spurring and mild disc bulging noted
at C6-C7. Moderate left neural foraminal narrowing. No convincing
disc herniation.

Upper chest: No acute or significant abnormality.

Other: None.
IMPRESSION: HEAD CT

1. No acute intracranial abnormalities.

CERVICAL CT

1. No fracture or acute finding.

## 2021-10-08 IMAGING — CR DG CHEST 1V PORT
1 series · 1 of 1 positions shown · non-contrast
Comparison: [DATE]

CLINICAL DATA: Status post fall

EXAM:
PORTABLE CHEST - 1 VIEW

[x chest ap]
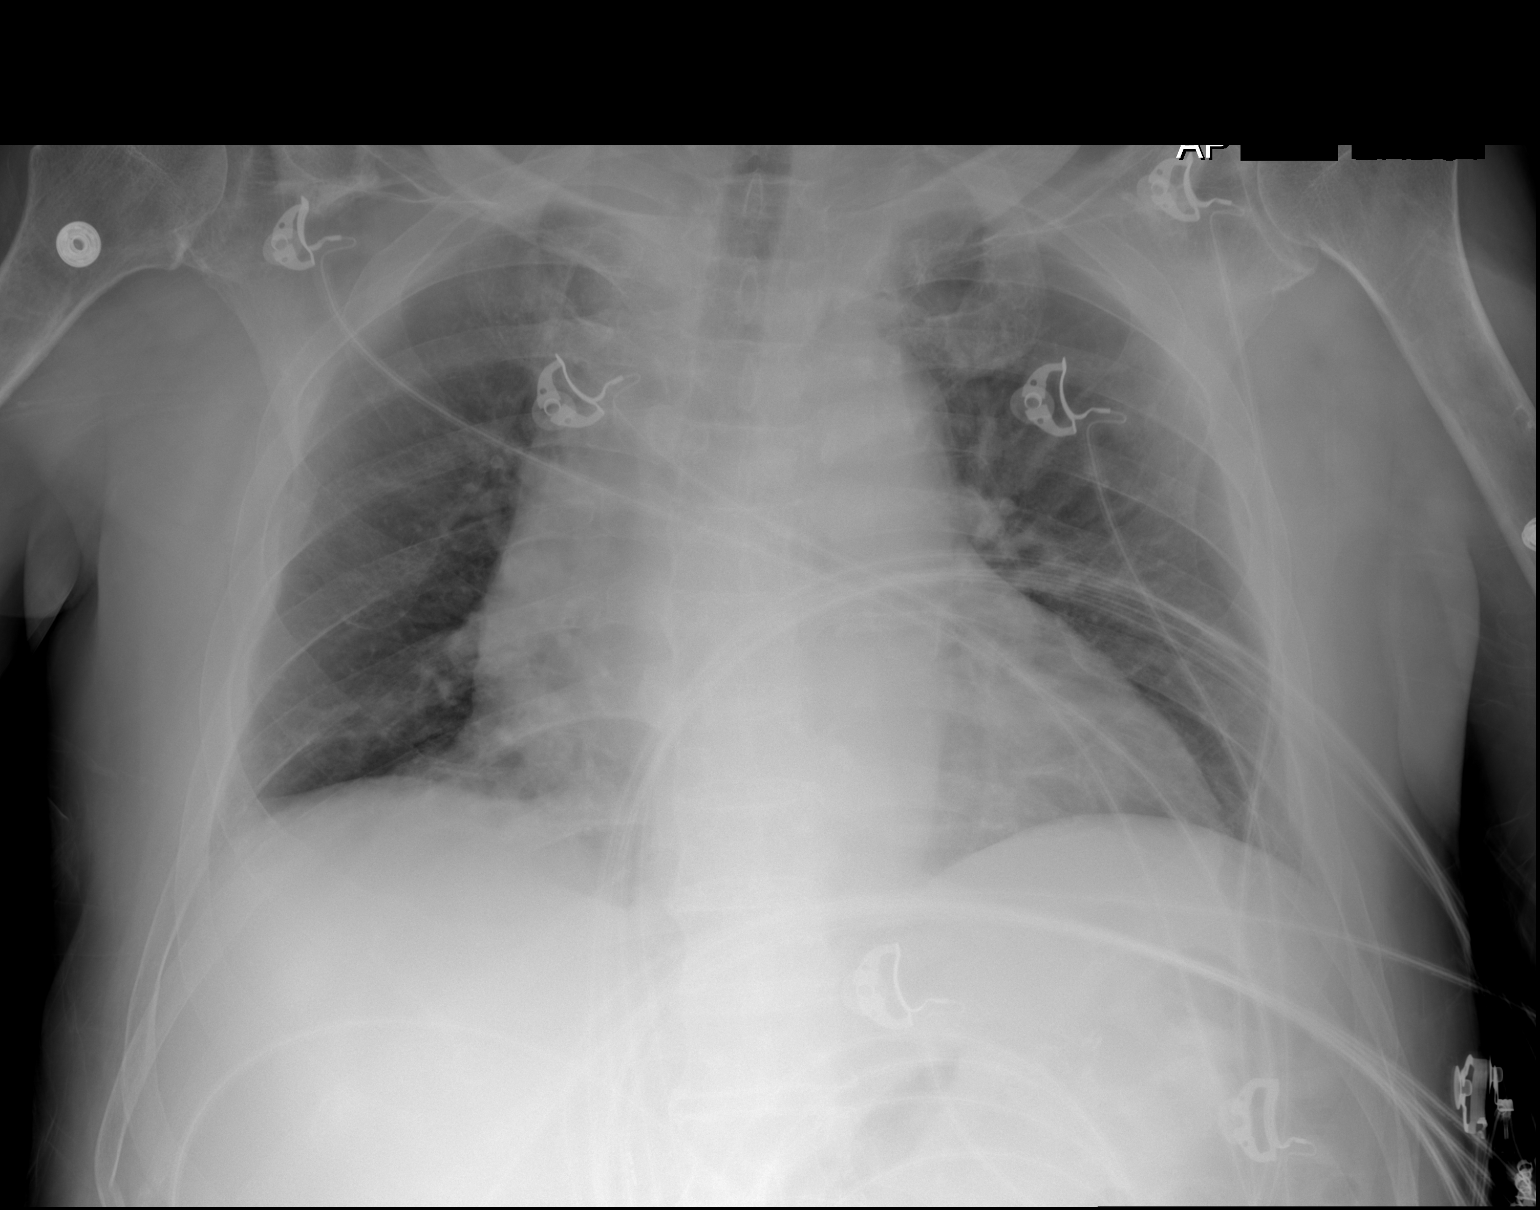

[1 of 1 positions shown; findings below may reference images not displayed]

FINDINGS: Apparent cardiomegaly most likely related to extra [REDACTED] phase of
imaging and portable technique.

No pulmonary vascular congestion.  Lungs clear.  No pneumothorax.
IMPRESSION: No acute cardiopulmonary process.

## 2021-10-08 IMAGING — CT CT T SPINE W/O CM
3 of 4 series · 12 of 33 positions shown, 14 images · non-contrast
Comparison: None Available.

CLINICAL DATA: Fall in bathroom with low back pain

EXAM:
CT THORACIC AND LUMBAR SPINE WITHOUT CONTRAST
TECHNIQUE: Multidetector CT imaging of the thoracic and lumbar spine was
performed without contrast. Multiplanar CT image reconstructions
were also generated.
RADIATION DOSE REDUCTION: This exam was performed according to the
departmental dose-optimization program which includes automated
exposure control, adjustment of the mA and/or kV according to
patient size and/or use of iterative reconstruction technique.

[Series 4: t spine st · axial · 0.41mm/px · z∈[-171,+31]mm · 4 of 153 slices shown, 5 images]
[im 26/153  soft-tissue]
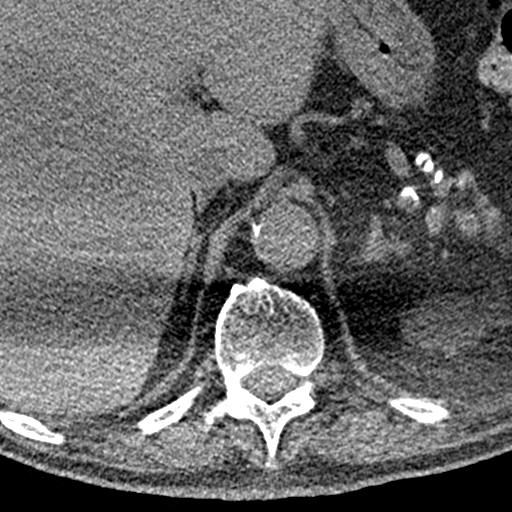
[im 26/153  bone]
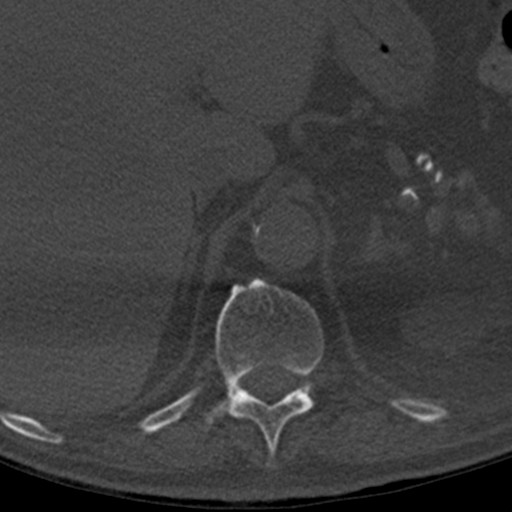
[im 51/153  bone]
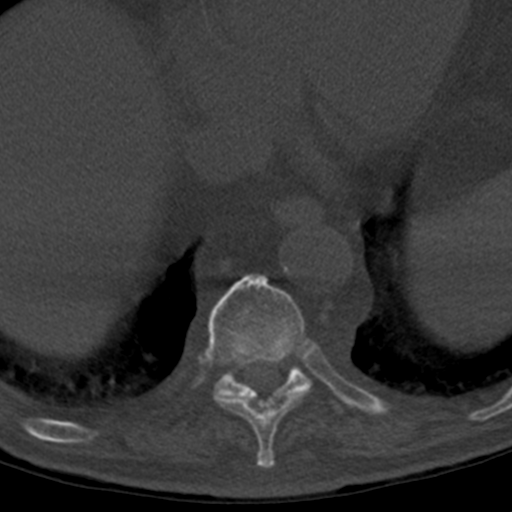
[im 102/153  bone]
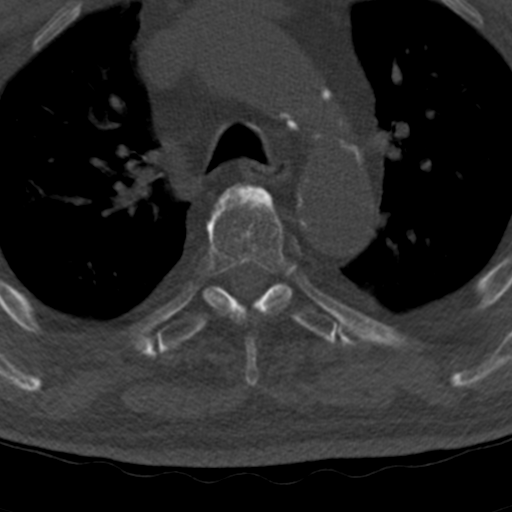
[im 127/153  bone]
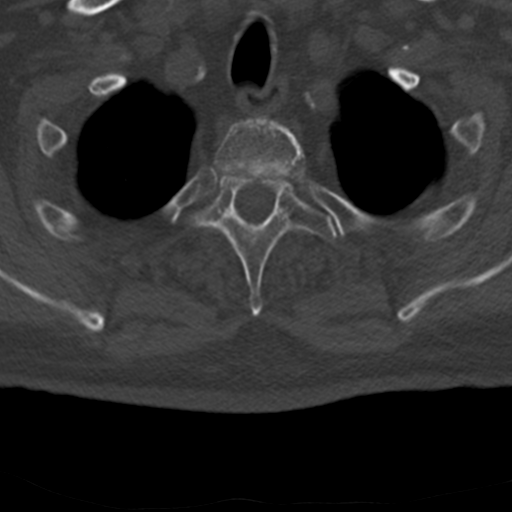

[Series 8: coronal bone · coronal · 0.50mm/px · 3 of 108 slices shown]
[im 22/108  bone]
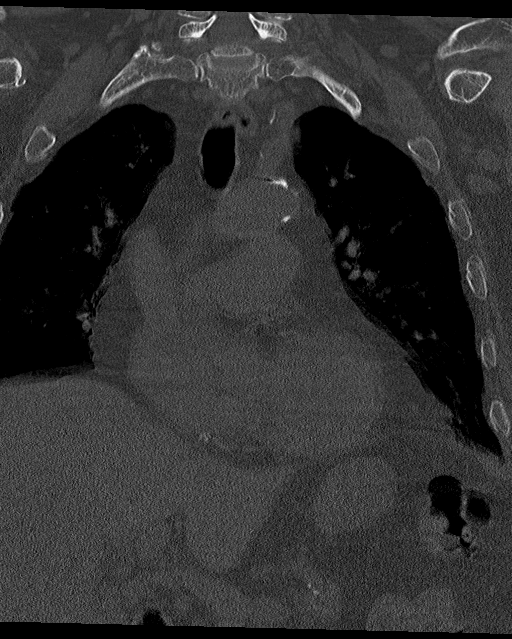
[im 43/108  bone]
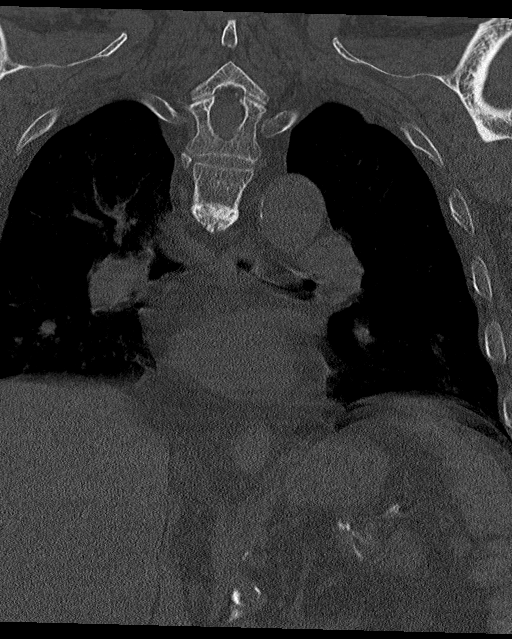
[im 65/108  bone]
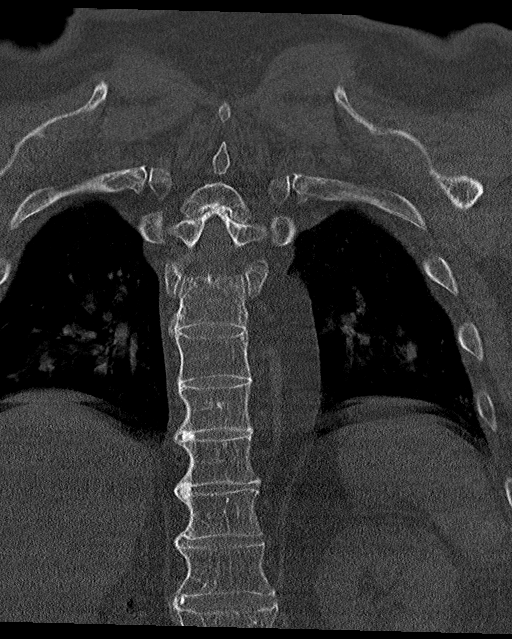

[Series 9: sagittal bone · sagittal · 0.40mm/px · 5 of 120 slices shown, 6 images]
[im 40/120  bone]
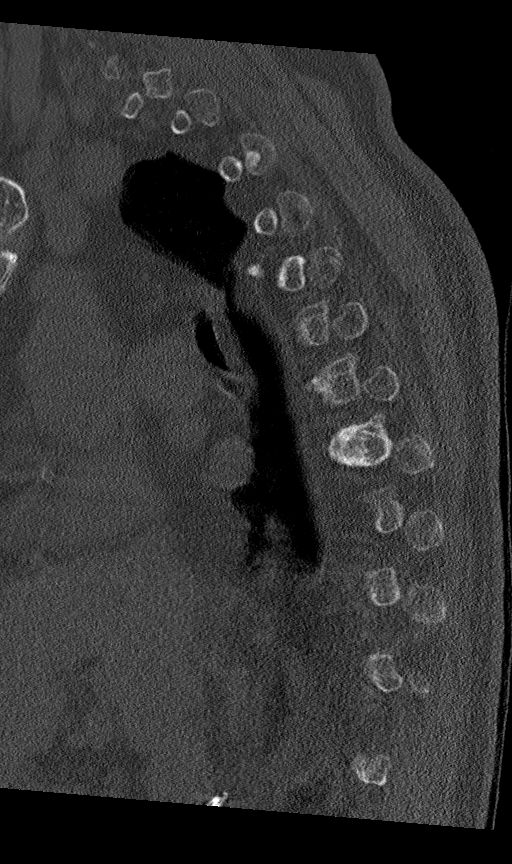
[im 50/120  bone]
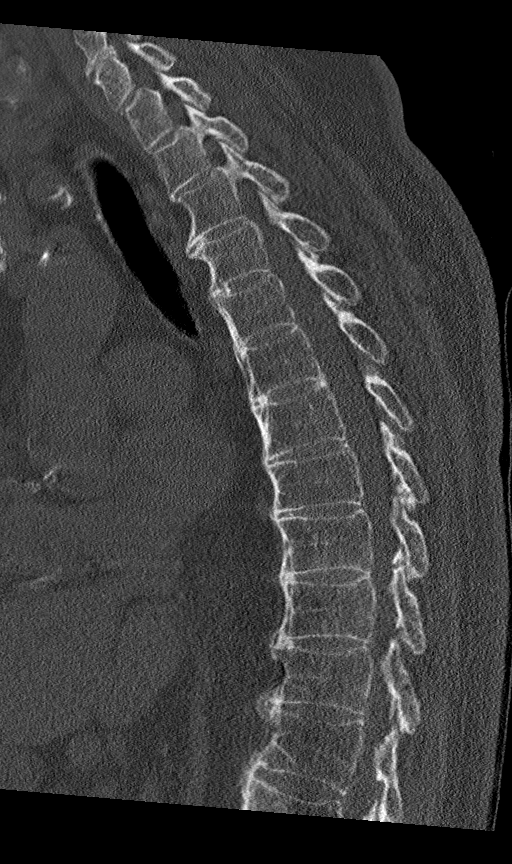
[im 60/120  soft-tissue]
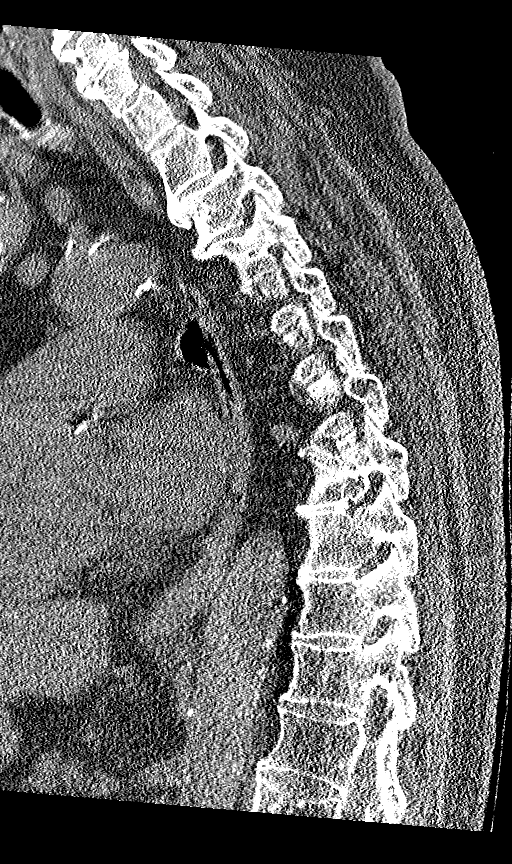
[im 60/120  bone]
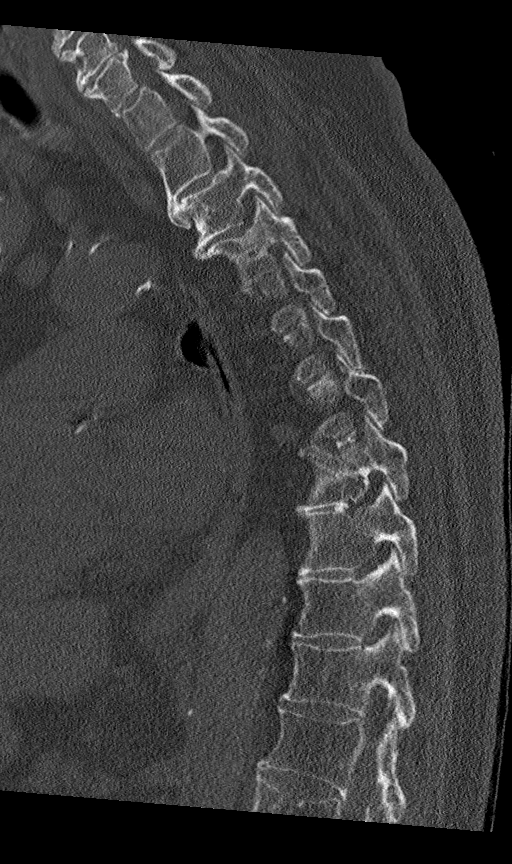
[im 70/120  bone]
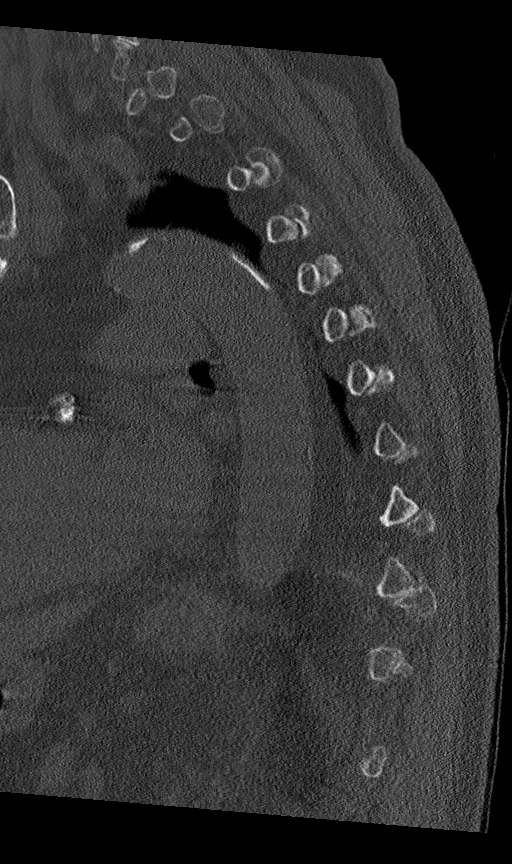
[im 80/120  bone]
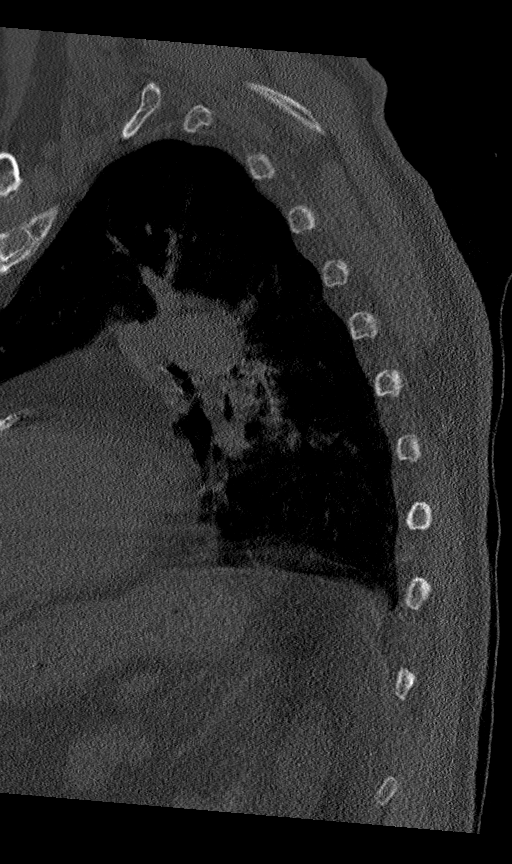

[12 of 33 positions shown; findings below may reference images not displayed]

FINDINGS: CT THORACIC SPINE FINDINGS

Alignment: Exaggerated thoracic kyphosis.  No traumatic malalignment

Vertebrae: Bridging osteophytes span T2 to the lumbar spine. No
superimposed fracture is seen. Generalized osteopenia.

Paraspinal and other soft tissues: No paraspinal hematoma or
masslike finding.

Disc levels: No visible herniation or impingement

CT LUMBAR SPINE FINDINGS

Segmentation: 5 lumbar type vertebrae

Alignment: Normal

Vertebrae: Rigid spine from T2-L2 with fracture through the anterior
superior corner of L2 also traversing an anterior intervertebral
osteophyte at L1-2. No displacement or visible posterior element
involvement at this level. Additional fractures through anterior
superior L4 endplate osteophyte.

Paraspinal and other soft tissues: Mild paravertebral at edema at
the level of fractures.

Disc levels: Generalized disc bulging without focal high-grade
stenosis is seen.
IMPRESSION: 1. L2 anterior superior corner fracture also involving a L1-2
bridging osteophyte. No listhesis or visible posterior element
involvement.
2. L4 anterior superior osteophyte fracture.
3. Rigid spine from bridging osteophytes at T2-L2.

## 2021-10-08 IMAGING — CR DG WRIST COMPLETE 3+V*R*
4 series · 4 of 4 positions shown · non-contrast
Comparison: None available

CLINICAL DATA: Status post fall

EXAM:
RIGHT WRIST - COMPLETE 3+ VIEW

[x wrist pa right]
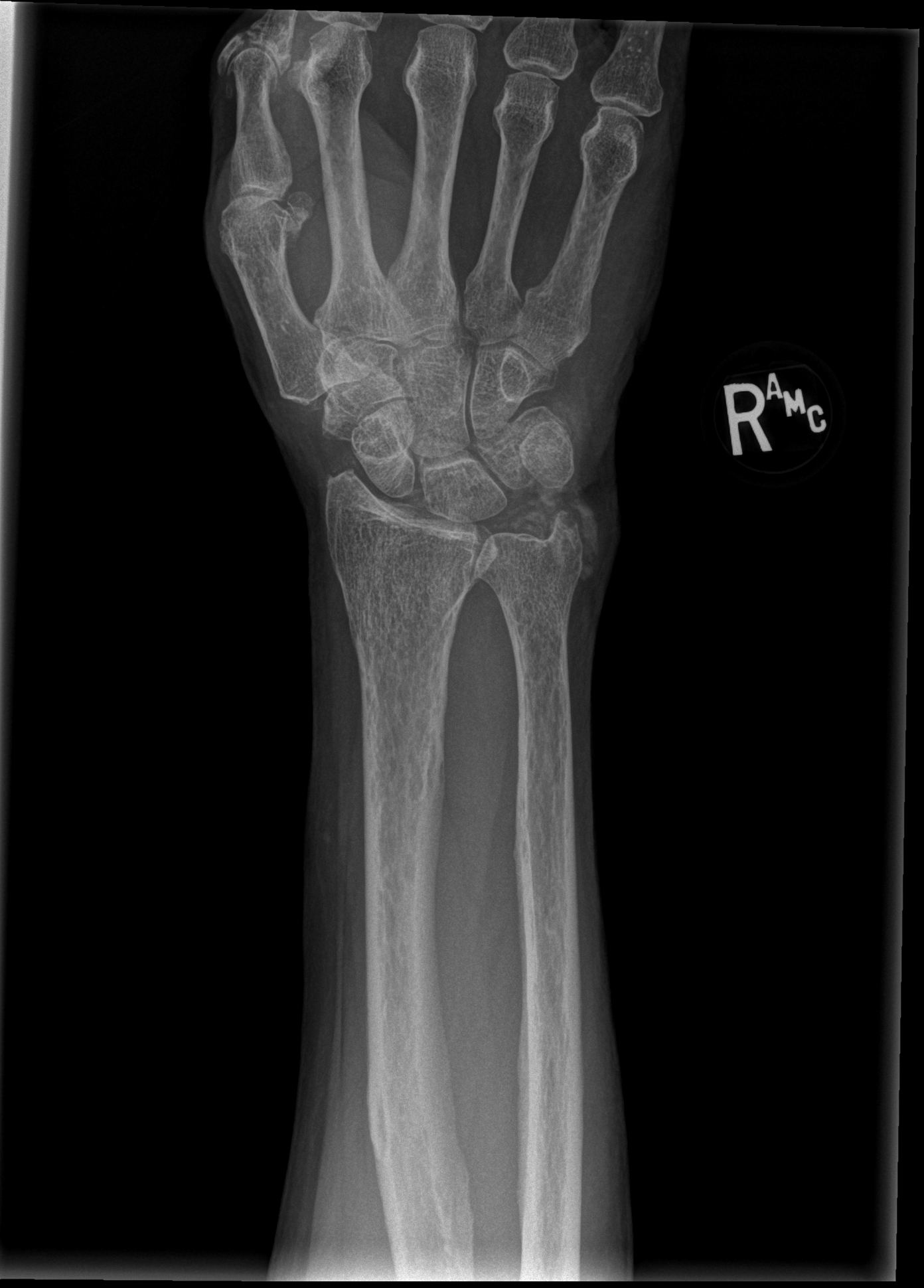

[x wrist obl right]
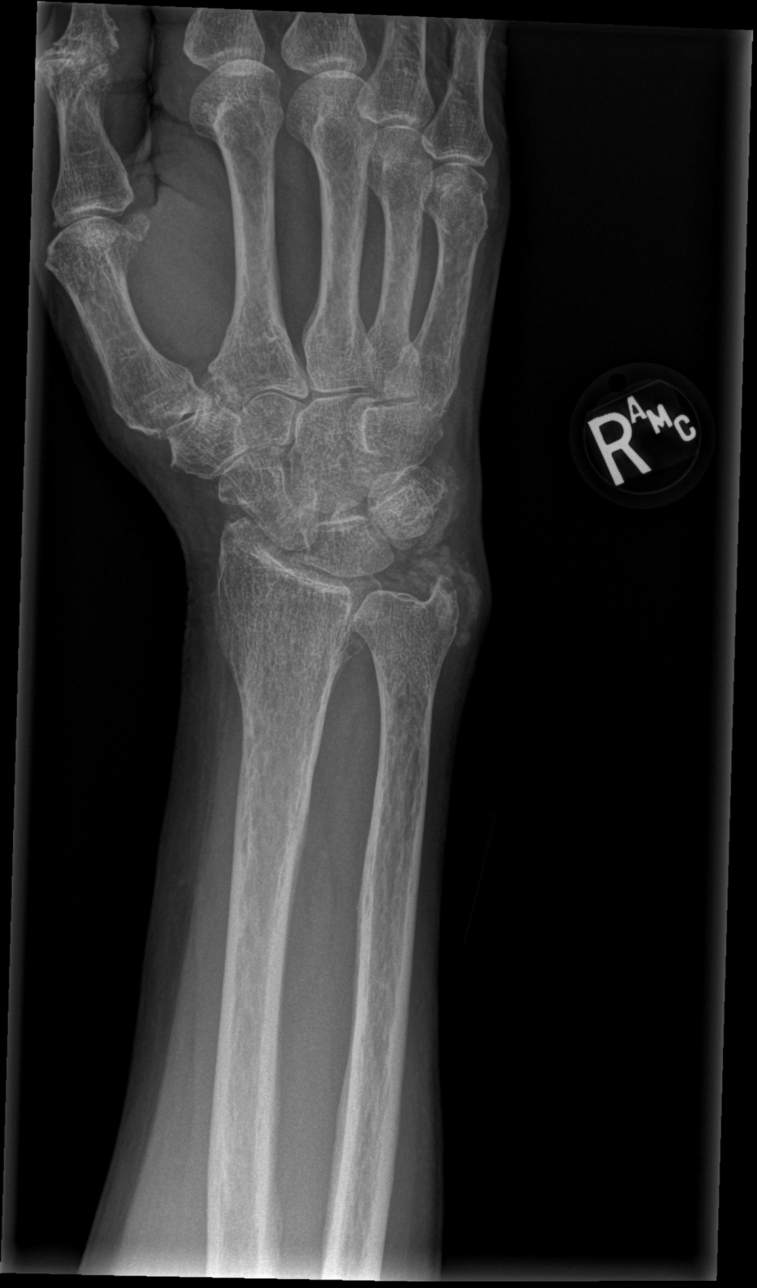

[x wrist lat right]
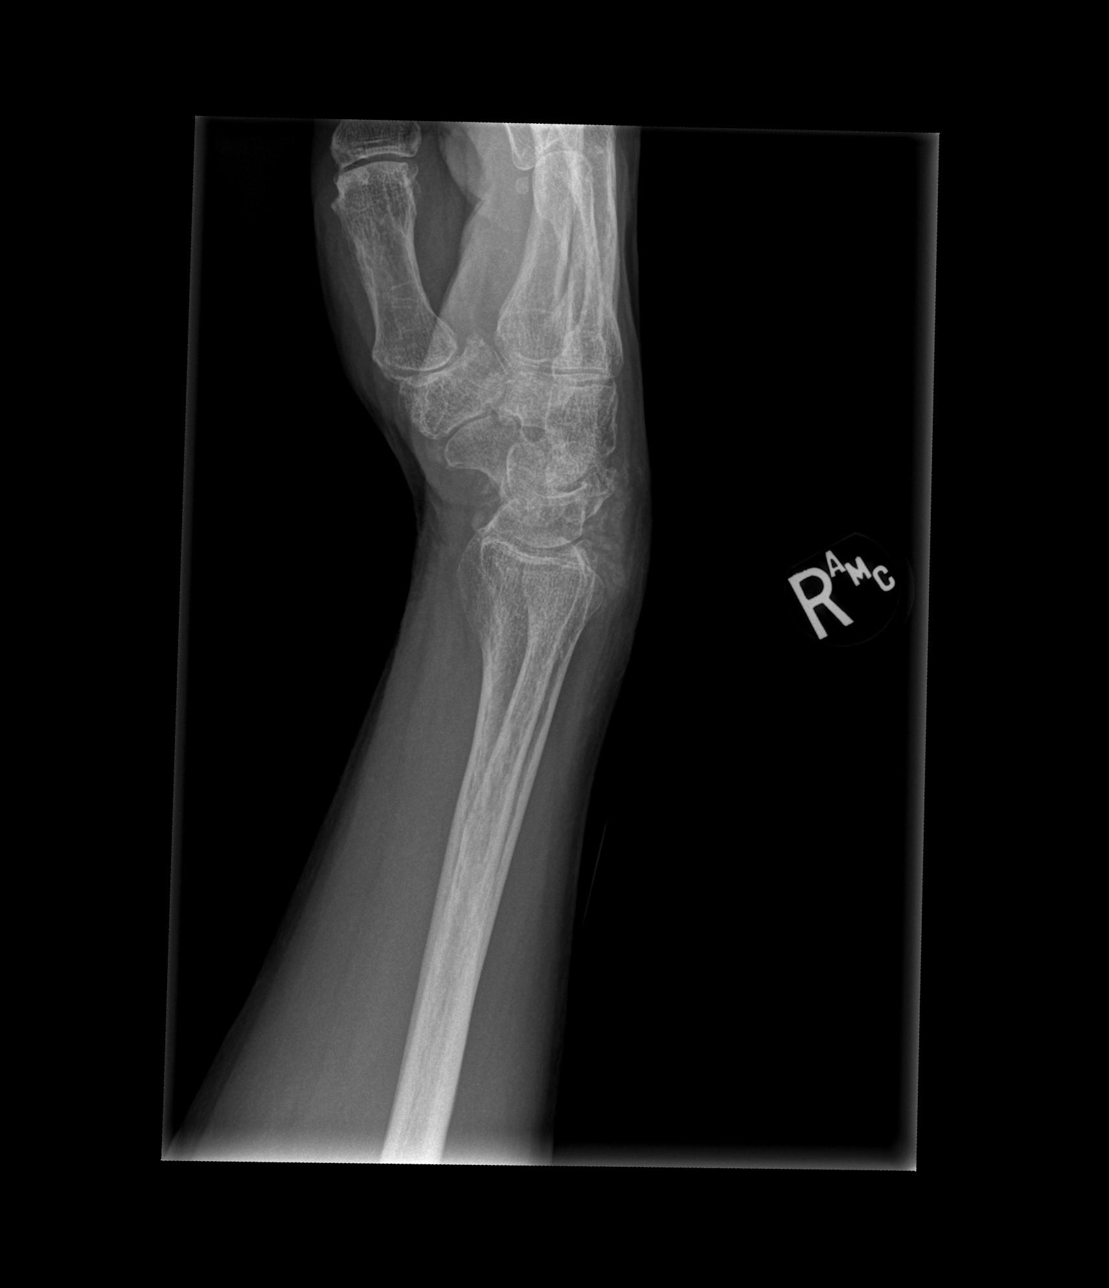

[x wrist navicular view right]
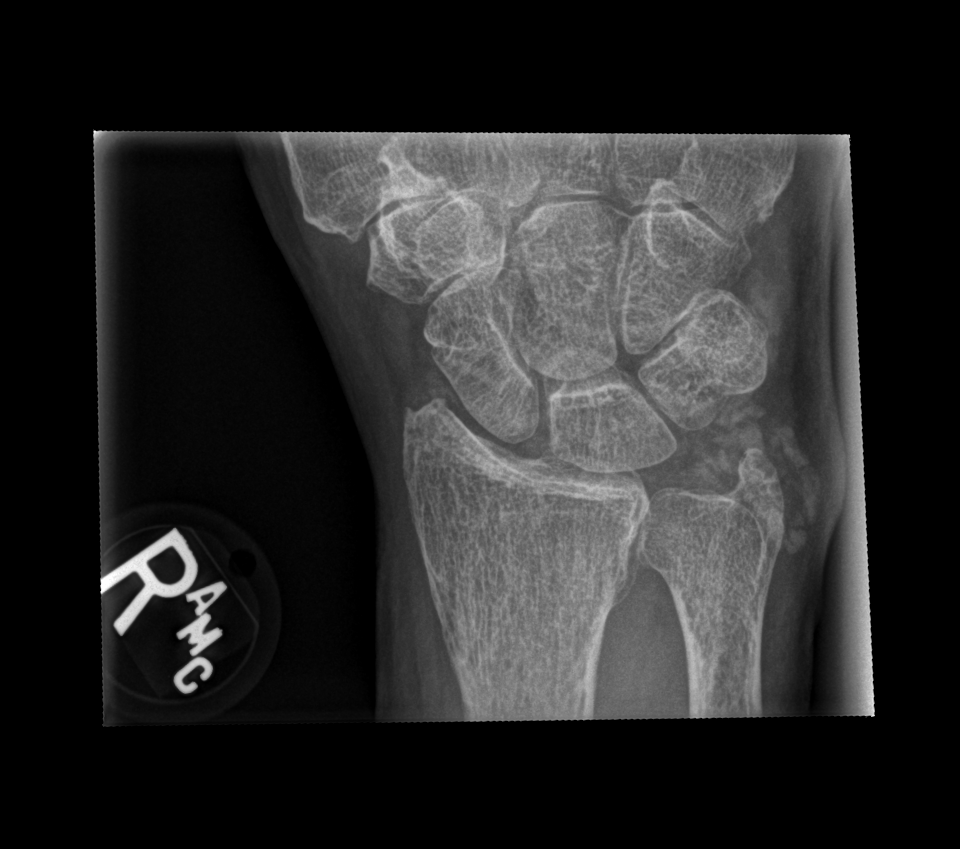

[4 of 4 positions shown; findings below may reference images not displayed]

FINDINGS: Extensive chondrocalcinosis of the triangular fibrocartilage. No
fracture or dislocation. Moderate degenerative changes of the
interphalangeal joint of the thumb. Mild degenerative changes at the
first carpometacarpal joint.
IMPRESSION: No acute abnormality of the right wrist.

## 2021-10-08 IMAGING — US US ABDOMEN COMPLETE
1 series · 15 of 25 positions shown · non-contrast
Comparison: Renal ultrasound [DATE]

CLINICAL DATA: Patient with previous history of melanoma, and right
nephrectomy for carcinoma. Elevated LFTs with SOLOMON NANA. Nearly 10 cm
liver mass noted on renal ultrasound [DATE].

EXAM:
ABDOMEN ULTRASOUND COMPLETE

[Series 1: us abdomen complete mc & wl · 15 of 51 slices shown]
[im 1/51]
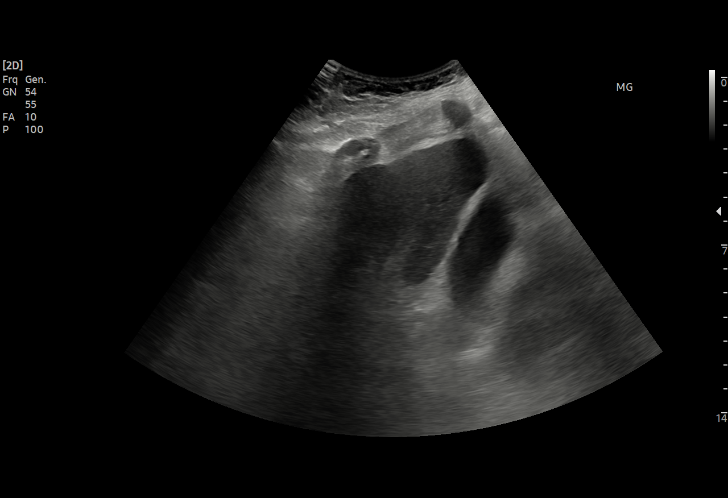
[im 5/51]
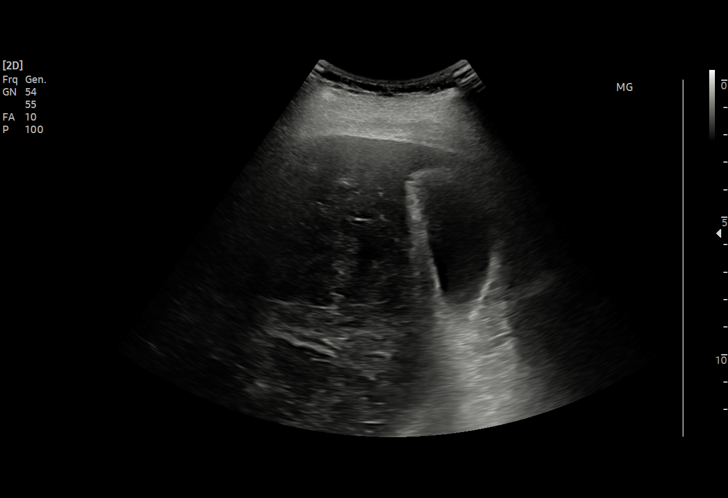
[im 9/51]
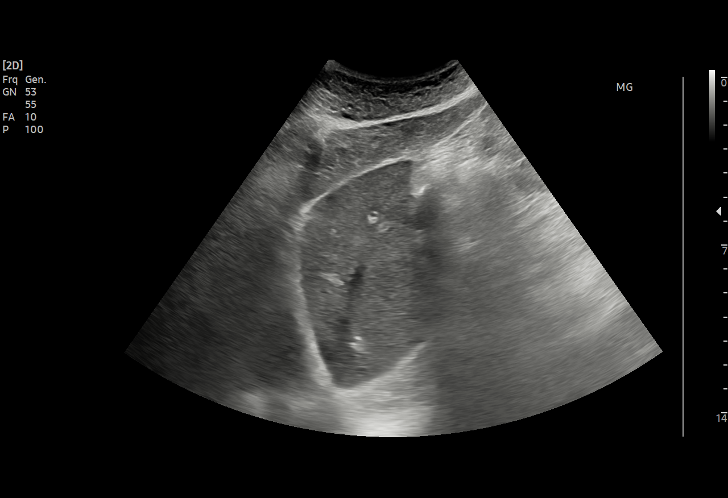
[im 11/51]
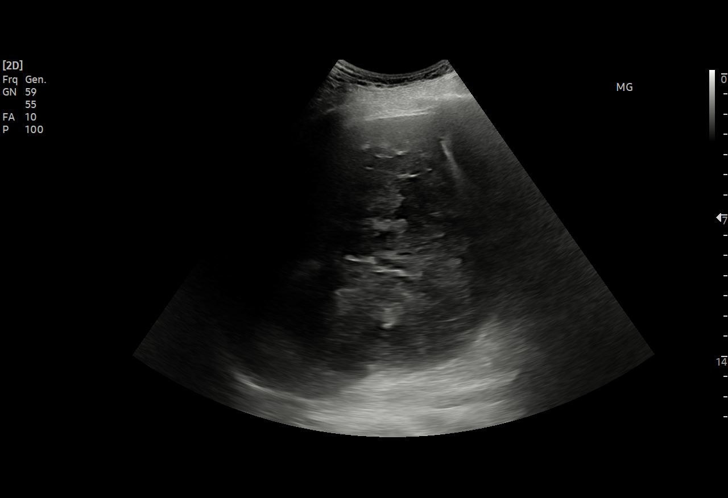
[im 15/51]
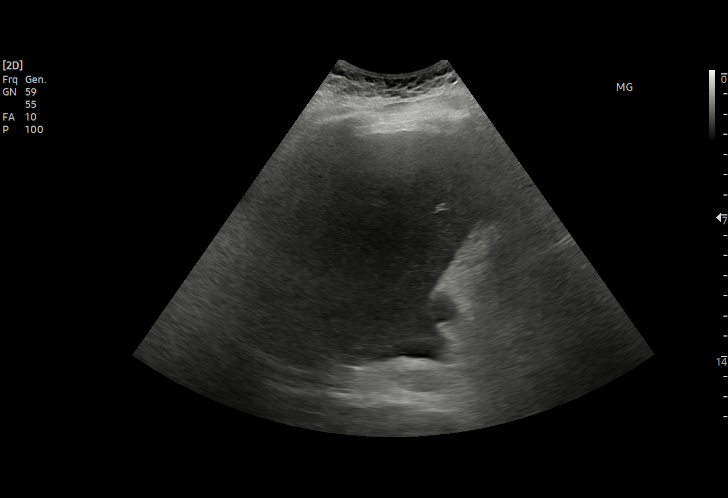
[im 19/51]
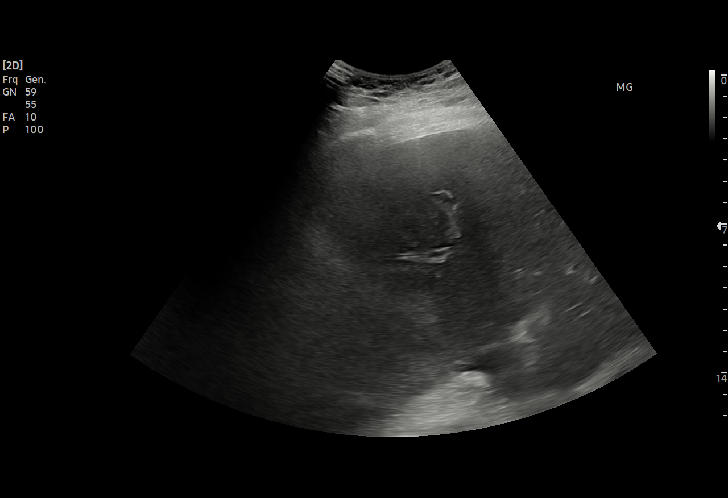
[im 21/51]
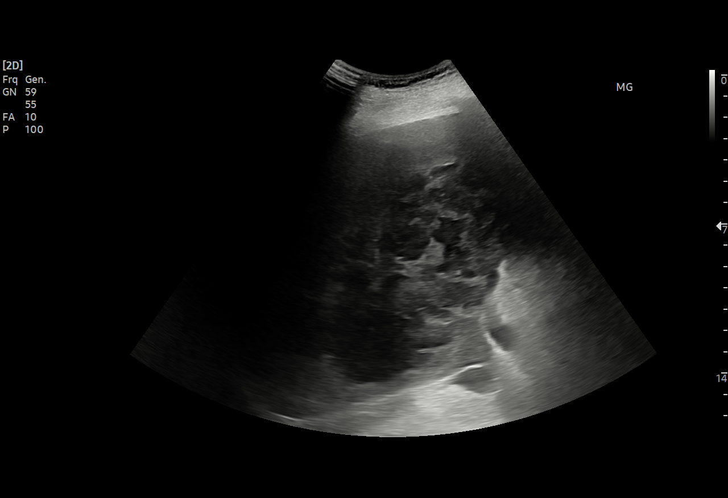
[im 26/51]
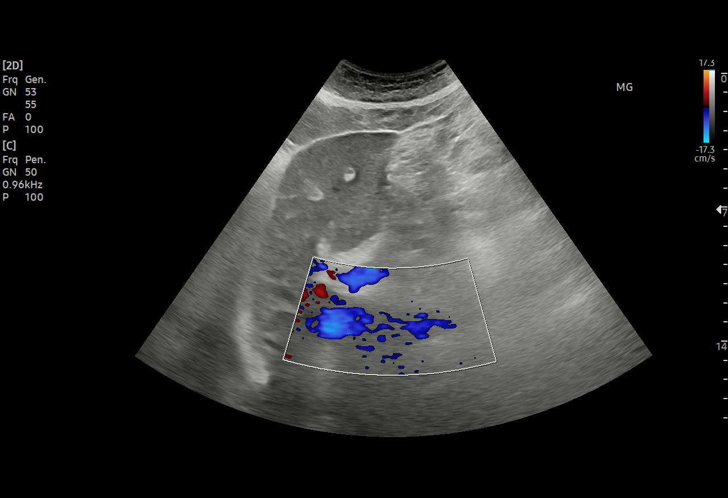
[im 30/51]
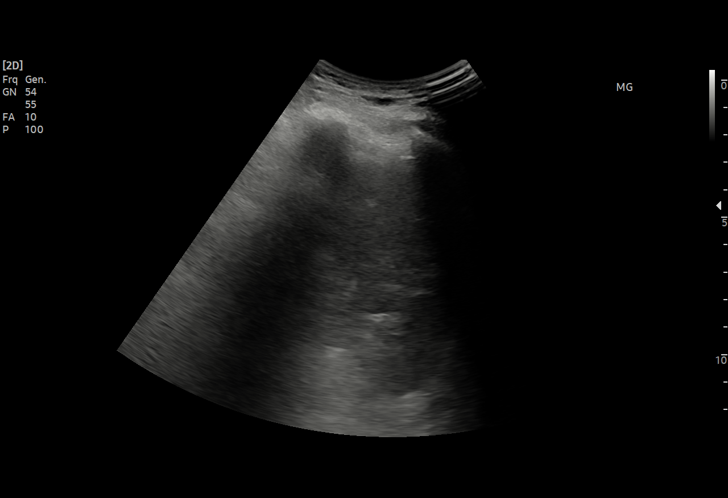
[im 32/51]
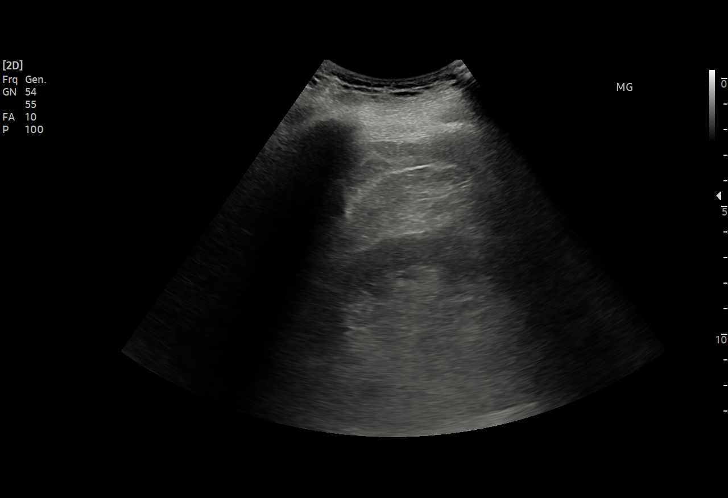
[im 36/51]
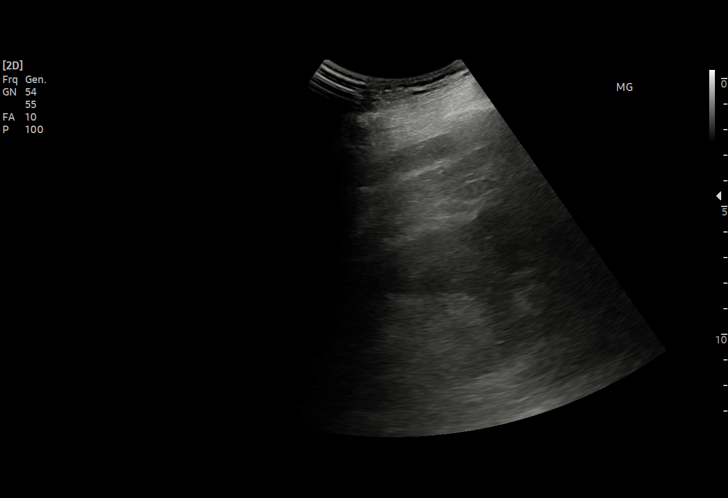
[im 40/51]
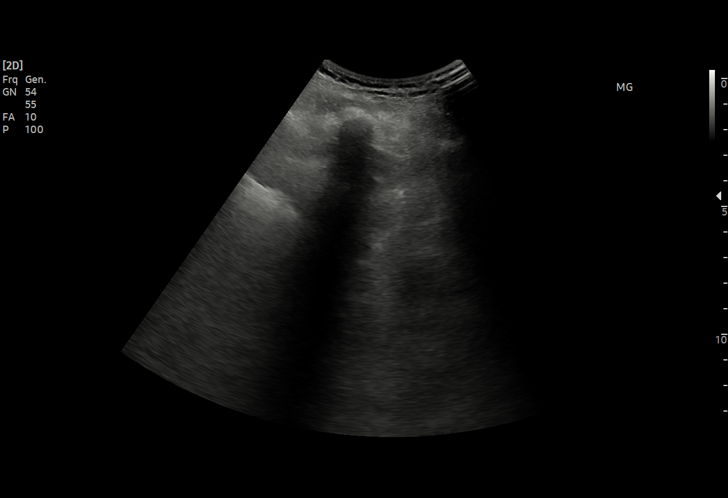
[im 42/51]
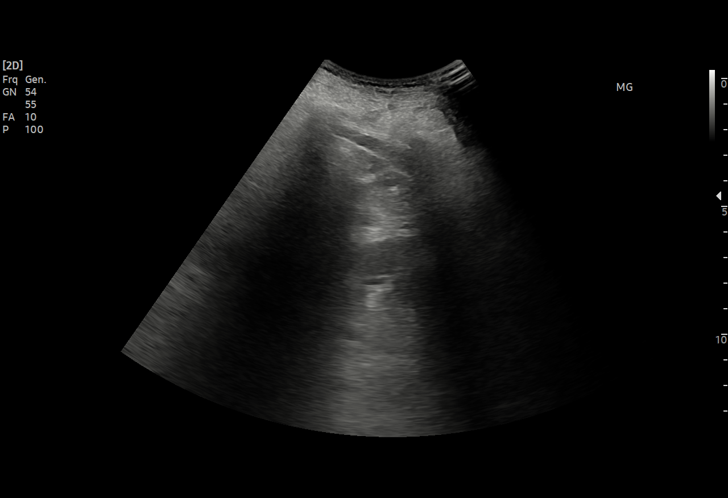
[im 46/51]
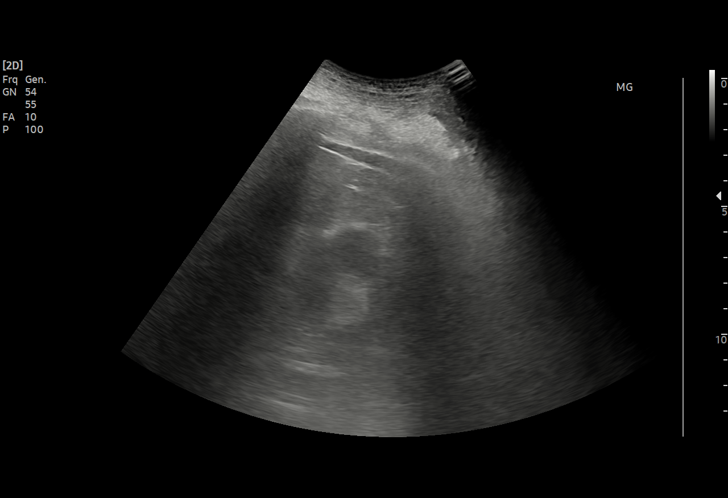
[im 51/51]
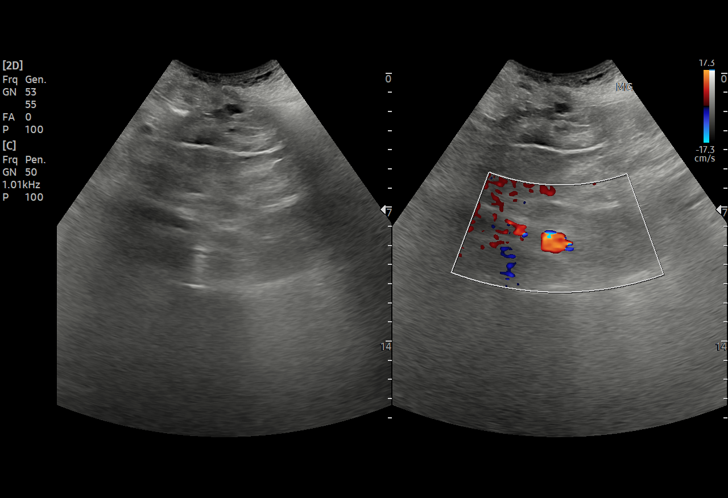

[15 of 25 positions shown; findings below may reference images not displayed]

FINDINGS: Gallbladder: No gallstones or wall thickening visualized. No
sonographic Murphy sign noted by sonographer.

Common bile duct: Diameter: Nonvisualized.

Liver: There is heterogeneous solid mass in the right lobe, today
measuring 11.6 x 11.1 x 9.2 cm, previously 9.9 x 9.0 x 9.3 cm.
Otherwise within normal limits in parenchymal echogenicity. Portal
vein is patent on color Doppler imaging with normal direction of
blood flow towards the liver.

IVC: No abnormality visualized.

Pancreas: Not seen, obscured by bowel gas.

Spleen: Size and appearance within normal limits. Length measurement
is 10.8 cm.

Right Kidney: Length: Surgically absent.

Left Kidney: Length: 10.4 cm. Echogenicity within normal limits. No
mass or hydronephrosis visualized.

Abdominal aorta: No aneurysm visualized.

Other findings: No free fluid is seen.

Technically difficult due to: Patient inability to assist with
positioning or take deep breaths, inability to hold his breath, and
also suboptimal image quality due to multifocal bowel gas shadowing
and patient body habitus.
IMPRESSION: 1. Surgically absent right kidney with sonographically unremarkable
left kidney, as far as seen. Image quality limited.
2. Enlarging mass in the right lobe of the liver, now nearly 12 cm
in size most likely representing a primary or metastatic neoplasm.
3. Nonvisualization of the extrahepatic bile ducts and pancreas.

## 2021-10-08 MED ORDER — INSULIN ASPART 100 UNIT/ML IJ SOLN
0.0000 [IU] | Freq: Three times a day (TID) | INTRAMUSCULAR | Status: DC
  Administered 2021-10-08: 5 [IU] via SUBCUTANEOUS
  Administered 2021-10-09: 3 [IU] via SUBCUTANEOUS
  Administered 2021-10-09: 2 [IU] via SUBCUTANEOUS
  Administered 2021-10-10 – 2021-10-11 (×3): 3 [IU] via SUBCUTANEOUS
  Administered 2021-10-12: 2 [IU] via SUBCUTANEOUS
  Administered 2021-10-13: 3 [IU] via SUBCUTANEOUS
  Administered 2021-10-14: 2 [IU] via SUBCUTANEOUS
  Administered 2021-10-14 (×2): 5 [IU] via SUBCUTANEOUS
  Administered 2021-10-15: 2 [IU] via SUBCUTANEOUS
  Administered 2021-10-15: 5 [IU] via SUBCUTANEOUS
  Administered 2021-10-15: 3 [IU] via SUBCUTANEOUS
  Administered 2021-10-16: 5 [IU] via SUBCUTANEOUS
  Administered 2021-10-16: 8 [IU] via SUBCUTANEOUS
  Administered 2021-10-16: 3 [IU] via SUBCUTANEOUS
  Administered 2021-10-17: 11 [IU] via SUBCUTANEOUS
  Administered 2021-10-17: 8 [IU] via SUBCUTANEOUS
  Administered 2021-10-17: 5 [IU] via SUBCUTANEOUS
  Administered 2021-10-18: 15 [IU] via SUBCUTANEOUS
  Administered 2021-10-18: 11 [IU] via SUBCUTANEOUS
  Administered 2021-10-18 – 2021-10-19 (×2): 5 [IU] via SUBCUTANEOUS

## 2021-10-08 MED ORDER — SODIUM CHLORIDE 0.45 % IV SOLN
INTRAVENOUS | Status: DC
  Filled 2021-10-08 (×3): qty 75

## 2021-10-08 MED ORDER — HYDROMORPHONE HCL 1 MG/ML IJ SOLN
1.0000 mg | Freq: Once | INTRAMUSCULAR | Status: AC
  Administered 2021-10-08: 1 mg via INTRAVENOUS
  Filled 2021-10-08: qty 1

## 2021-10-08 MED ORDER — FENTANYL CITRATE PF 50 MCG/ML IJ SOSY
25.0000 ug | PREFILLED_SYRINGE | Freq: Once | INTRAMUSCULAR | Status: AC
  Administered 2021-10-08: 25 ug via INTRAVENOUS
  Filled 2021-10-08: qty 1

## 2021-10-08 MED ORDER — SODIUM CHLORIDE 0.9 % IV BOLUS
1000.0000 mL | Freq: Once | INTRAVENOUS | Status: AC
  Administered 2021-10-08: 1000 mL via INTRAVENOUS

## 2021-10-08 MED ORDER — TETANUS-DIPHTH-ACELL PERTUSSIS 5-2.5-18.5 LF-MCG/0.5 IM SUSY
0.5000 mL | PREFILLED_SYRINGE | Freq: Once | INTRAMUSCULAR | Status: AC
  Administered 2021-10-08: 0.5 mL via INTRAMUSCULAR
  Filled 2021-10-08: qty 0.5

## 2021-10-08 MED ORDER — PROCHLORPERAZINE EDISYLATE 10 MG/2ML IJ SOLN: 10.0000 mg | Freq: Four times a day (QID) | INTRAMUSCULAR | Status: DC | PRN

## 2021-10-08 MED ORDER — HYDRALAZINE HCL 20 MG/ML IJ SOLN: 10.0000 mg | Freq: Three times a day (TID) | INTRAMUSCULAR | Status: DC | PRN

## 2021-10-08 MED ORDER — BACITRACIN ZINC 500 UNIT/GM EX OINT
TOPICAL_OINTMENT | CUTANEOUS | Status: AC
  Administered 2021-10-08: 1
  Filled 2021-10-08: qty 0.9

## 2021-10-08 MED ORDER — HYDROMORPHONE HCL 1 MG/ML IJ SOLN
0.5000 mg | Freq: Four times a day (QID) | INTRAMUSCULAR | Status: DC | PRN
  Administered 2021-10-08 – 2021-10-09 (×3): 0.5 mg via INTRAVENOUS
  Filled 2021-10-08 (×3): qty 0.5

## 2021-10-08 MED ORDER — ONDANSETRON HCL 4 MG/2ML IJ SOLN
4.0000 mg | Freq: Once | INTRAMUSCULAR | Status: AC
  Administered 2021-10-08: 4 mg via INTRAVENOUS
  Filled 2021-10-08: qty 2

## 2021-10-08 MED ORDER — SODIUM CHLORIDE 0.9 % IV BOLUS
500.0000 mL | Freq: Once | INTRAVENOUS | Status: AC
  Administered 2021-10-08: 500 mL via INTRAVENOUS

## 2021-10-08 NOTE — Progress Notes (Signed)
Orthopedic Tech Progress Note Patient Details:  [name] 29-Apr-1941 [id number]  Patient ID: [name], male   DOB: [birth year], 81 y.o.   MRN: [id number]  [name] 10/08/2021, 1:07 PMCalled and routed TLSO brace order to [facility].

## 2021-10-08 NOTE — ED Provider Notes (Signed)
[facility] DEPT Provider Note   CSN: [id number] Arrival date & time: 10/08/21  1013     History  Chief Complaint  Patient presents with   Fall   Weakness    [name] is a 81 y.o. male.  Patient as above with significant medical history as below, including CKD, gout, HLD, HTN, MGUS, singleton kidney status post nephrectomy who presents to the ED with complaint of fall.  Patient reports he attempted to get out of bed late last night and "my legs gave out."  Patient is unsure if he hit his head.  He was unable to get up from the floor.  He was on the ground by his bed for approximately 9 to 10 hours.  He lives with wife.  Denies syncope.  No chest pain or dyspnea.  No abdominal pain nausea or vomiting.  No thinners. Does report diffuse back pain.  Difficulty moving his legs secondary to the back pain.  No numbness or tingling.  No headache.  No urine/bowel overflow/incontinence, no saddle paresthesias. No recent medication images.  Does report reduced appetite the last few days.  No fevers or chills, no recent travel or sick contacts.  Typically ambulates without assistance.  Unsure of last tetanus shot, thinks was around 10 years ago    Past Medical History:  Diagnosis Date   Arthritis    "little in my right hand; some in my right big toe" (10/22/2016)   Borderline diabetes    "tx'd w/RX; blood surgars dropped too low; dr. 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 at [facility] in [place] ([place]) 2006   s/p brachytherapy   Seborrheic keratosis    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   history of nephrectomy in 1984    Past Surgical History:  Procedure Laterality Date   [place]  09/26/2016; 10/22/2016   "4 stents; 2 stents"   CORONARY STENT INTERVENTION Right 09/26/2016   Procedure: Coronary Stent Intervention;  Surgeon: Adrian Prows, MD;  Location: [facility];  Service: Cardiovascular;  Laterality: Right;   CORONARY STENT INTERVENTION N/A 10/22/2016   Procedure: Coronary Stent Intervention;  Surgeon: Adrian Prows, MD;  Location: [facility];  Service: Cardiovascular;  Laterality: N/A;   CYSTOSCOPY/URETEROSCOPY/HOLMIUM LASER/STENT PLACEMENT Left 11/29/2018   Procedure: CYSTOSCOPY/URETEROSCOPY/HOLMIUM LASER/STENT PLACEMENTleft retrograde pylegram;  Surgeon: Ardis Hughs, MD;  Location: [facility];  Service: Urology;  Laterality: Left;   HOLMIUM LASER APPLICATION  2/62/0355   Procedure: HOLMIUM LASER APPLICATION;  Surgeon: Ardis Hughs, MD;  Location: [facility];  Service: Urology;;   INSERTION PROSTATE RADIATION SEED  2009?   LEFT HEART CATH AND CORONARY ANGIOGRAPHY N/A 09/26/2016   Procedure: Left Heart Cath and Coronary Angiography;  Surgeon: Adrian Prows, MD;  Location: [facility];  Service: Cardiovascular;  Laterality: N/A;   NEPHRECTOMY Right 1984   [place]  REPAIR KNEE LIGAMENT  04/2016   "tore all the ligaments; had to put 3 pins in to reattach; Dr. Gladstone Lighter"   TONSILLECTOMY       The history is provided by the patient. No language interpreter was used.  Fall Pertinent negatives include no chest pain, no abdominal pain, no headaches and no shortness of breath.  Weakness Associated symptoms: no abdominal pain, no chest pain, no cough, no fever, no headaches, no nausea, no shortness of breath and no vomiting       Home Medications Prior to Admission medications   Medication Sig Start  Date End Date Taking? Authorizing Provider  amLODipine (NORVASC) 2.5 MG tablet Take 2.5 mg by mouth daily. 09/16/21  Yes [provider]  aspirin EC 81 MG tablet Take 81 mg by mouth daily.   Yes [provider]  BYSTOLIC 20 MG TABS Take 10 mg by mouth daily. daily   Yes [provider]  dorzolamide-timolol (COSOPT) 22.3-6.8 MG/ML ophthalmic solution Place 1 drop into both eyes 2 (two) times daily. 09/26/21  Yes [provider]  isosorbide mononitrate (IMDUR) 60 MG 24 hr tablet Take 1 tablet (60 mg total) by mouth daily. 06/27/21  Yes Adrian Prows, MD  latanoprost (XALATAN) 0.005 % ophthalmic solution Place 1 drop into both eyes at bedtime. 07/14/21  Yes [provider]  nitroGLYCERIN (NITROSTAT) 0.4 MG SL tablet Place 1 tablet (0.4 mg total) under the tongue every 5 (five) minutes x 3 doses as needed for chest pain. 06/27/21  Yes Adrian Prows, MD  pantoprazole (PROTONIX) 40 MG tablet Take 40 mg by mouth daily.   Yes [provider]  rosuvastatin (CRESTOR) 20 MG tablet Take 1 tablet (20 mg total) by mouth at bedtime. 06/27/21  Yes Adrian Prows, MD  TRESIBA FLEXTOUCH 100 UNIT/ML FlexTouch Pen Inject 10 Units into the skin daily. 08/22/21  Yes [provider]  Finerenone (KERENDIA) 10 MG TABS Take 1 tablet by mouth daily. Patient not taking: Reported on 10/08/2021 06/27/21   Adrian Prows, MD      Allergies    Sulfa antibiotics, Morphine and related, and Lisinopril    Review of Systems   Review of Systems  Constitutional:  Negative for chills and fever.  HENT:  Negative for facial swelling and trouble swallowing.   Eyes:  Negative for photophobia and visual disturbance.  Respiratory:  Negative for cough and shortness of breath.   Cardiovascular:  Negative for chest pain and palpitations.  Gastrointestinal:  Negative for abdominal pain, nausea and vomiting.  Endocrine: Negative for polydipsia and polyuria.  Genitourinary:  Negative for difficulty  urinating and hematuria.  Musculoskeletal:  Positive for back pain. Negative for gait problem and joint swelling.  Skin:  Positive for wound. Negative for pallor and rash.  Neurological:  Positive for weakness. Negative for syncope and headaches.  Psychiatric/Behavioral:  Negative for agitation and confusion.    Physical Exam Updated Vital Signs BP (!) 152/77 (BP Location: Left Arm)   Pulse 83   Temp 98 F (36.7 C) (Oral)   Resp 20   Ht 6' (1.829 m)   Wt 83.6 kg   SpO2 94%   BMI 25.00 kg/m  Physical Exam Vitals and nursing note reviewed.  Constitutional:      General: He is not in acute distress.    Appearance: Normal appearance. He is well-developed. He is not ill-appearing or diaphoretic.  HENT:     Head: Normocephalic and atraumatic. No raccoon eyes, Battle's sign, right periorbital  erythema or left periorbital erythema.     Jaw: There is normal jaw occlusion. No trismus.     Right Ear: External ear normal.     Left Ear: External ear normal.     Mouth/Throat:     Mouth: Mucous membranes are moist.  Eyes:     General: No scleral icterus.    Extraocular Movements: Extraocular movements intact.     Pupils: Pupils are equal, round, and reactive to light.  Cardiovascular:     Rate and Rhythm: Normal rate and regular rhythm.     Pulses: Normal pulses.          Radial pulses are 2+ on the right side and 2+ on the left side.       Dorsalis pedis pulses are 2+ on the right side and 2+ on the left side.     Heart sounds: Normal heart sounds.  Pulmonary:     Effort: Pulmonary effort is normal. No respiratory distress.     Breath sounds: Normal breath sounds.  Abdominal:     General: Abdomen is flat.     Palpations: Abdomen is soft.     Tenderness: There is no abdominal tenderness.  Musculoskeletal:        General: Normal range of motion.     Cervical back: Normal range of motion.     Right lower leg: No edema.     Left lower leg: No edema.     Comments: Midline TTP to  lower thoracic, lumbar spine.  No crepitus or step-off.  Pelvis stable to direct AP pressure.  Skin:    General: Skin is warm and dry.     Capillary Refill: Capillary refill takes less than 2 seconds.       Neurological:     Mental Status: He is alert and oriented to person, place, and time.     GCS: GCS eye subscore is 4. GCS verbal subscore is 5. GCS motor subscore is 6.     Cranial Nerves: Cranial nerves 2-12 are intact. No dysarthria or facial asymmetry.     Sensory: Sensation is intact.     Motor: Tremor present.     Coordination: Coordination is intact. Finger-Nose-Finger Test normal.     Comments: Chronic tremor bilateral upper extremities Gait not tested secondary to patient's safety  Psychiatric:        Mood and Affect: Mood normal.        Behavior: Behavior normal.    ED Results / Procedures / Treatments   Labs (all labs ordered are listed, but only abnormal results are displayed) Labs Reviewed  CBC WITH DIFFERENTIAL/PLATELET - Abnormal; Notable for the following components:      Result Value   WBC 15.6 (*)    RBC 3.97 (*)    Hemoglobin 11.2 (*)    HCT 34.3 (*)    Platelets 127 (*)    Neutro Abs 13.2 (*)    Lymphs Abs 0.6 (*)    Monocytes Absolute 1.7 (*)    Abs Immature Granulocytes 0.11 (*)    All other components within normal limits  COMPREHENSIVE METABOLIC PANEL - Abnormal; Notable for the following components:   CO2 19 (*)    Glucose, Bld 260 (*)    BUN 48 (*)    Creatinine, Ser 3.15 (*)    Calcium 8.5 (*)    Albumin 2.8 (*)    AST 165 (*)    ALT 84 (*)    Alkaline Phosphatase 342 (*)  GFR, Estimated 19 (*)    All other components within normal limits  CK - Abnormal; Notable for the following components:   Total CK 3,434 (*)    All other components within normal limits  HEMOGLOBIN A1C - Abnormal; Notable for the following components:   Hgb A1c MFr Bld 8.8 (*)    All other components within normal limits  GLUCOSE, CAPILLARY - Abnormal; Notable  for the following components:   Glucose-Capillary 210 (*)    All other components within normal limits  CBG MONITORING, ED - Abnormal; Notable for the following components:   Glucose-Capillary 234 (*)    All other components within normal limits  TROPONIN I (HIGH SENSITIVITY) - Abnormal; Notable for the following components:   Troponin I (High Sensitivity) 61 (*)    All other components within normal limits  TROPONIN I (HIGH SENSITIVITY) - Abnormal; Notable for the following components:   Troponin I (High Sensitivity) 66 (*)    All other components within normal limits  RESP PANEL BY RT-PCR (FLU A&B, COVID) ARPGX2  URINALYSIS, ROUTINE W REFLEX MICROSCOPIC  CK  COMPREHENSIVE METABOLIC PANEL  CBC    EKG EKG Interpretation  Date/Time:  Monday Oct 08 2021 10:27:49 EDT Ventricular Rate:  86 PR Interval:  198 QRS Duration: 77 QT Interval:  374 QTC Calculation: 450 R Axis:   28 Text Interpretation: Sinus rhythm Abnormal R-wave progression, early transition Probable anteroseptal infarct, old similar to prior no stemi Confirmed by Wynona Dove ([id number]) on 10/08/2021 12:54:30 PM  Radiology DG Pelvis 1-2 Views  Result Date: 10/08/2021 CLINICAL DATA:  Status post fall EXAM: PELVIS - 1-2 VIEW COMPARISON:  Renal stone protocol CT 10/30/2018 FINDINGS: Mild degenerative changes of the hips. Radiation seeds noted in the region the prostate. Soft tissues otherwise unremarkable. IMPRESSION: No acute fracture or dislocation of the pelvis. Electronically Signed   By: Miachel Roux M.D.   On: 10/08/2021 11:57   DG Wrist Complete Right  Result Date: 10/08/2021 CLINICAL DATA:  Status post fall EXAM: RIGHT WRIST - COMPLETE 3+ VIEW COMPARISON:  None available FINDINGS: Extensive chondrocalcinosis of the triangular fibrocartilage. No fracture or dislocation. Moderate degenerative changes of the interphalangeal joint of the thumb. Mild degenerative changes at the first carpometacarpal joint. IMPRESSION: No  acute abnormality of the right wrist. Electronically Signed   By: Miachel Roux M.D.   On: 10/08/2021 11:58   CT Head Wo Contrast  Result Date: 10/08/2021 CLINICAL DATA:  [place] in bathroom this morning. Low back pain. Patient reportedly did not hit his head. EXAM: CT HEAD WITHOUT CONTRAST CT CERVICAL SPINE WITHOUT CONTRAST TECHNIQUE: Multidetector CT imaging of the head and cervical spine was performed following the standard protocol without intravenous contrast. Multiplanar CT image reconstructions of the cervical spine were also generated. RADIATION DOSE REDUCTION: This exam was performed according to the departmental dose-optimization program which includes automated exposure control, adjustment of the mA and/or kV according to patient size and/or use of iterative reconstruction technique. COMPARISON:  08/13/2021. FINDINGS: CT HEAD FINDINGS Brain: No evidence of acute infarction, hemorrhage, hydrocephalus, extra-axial collection or mass lesion/mass effect. Vascular: No hyperdense vessel or unexpected calcification. Skull: Normal. Negative for fracture or focal lesion. Sinuses/Orbits: Globes and orbits are unremarkable. Sinuses are clear. Other: None. CT CERVICAL SPINE FINDINGS Alignment: Normal. Skull base and vertebrae: No acute fracture. No primary bone lesion or focal pathologic process. Soft tissues and spinal canal: No prevertebral fluid or swelling. No visible canal hematoma. Disc levels: Minor loss of disc height  at C5-C6. Moderate loss of disc height at C6-C7. Endplate spurring and mild disc bulging noted at C6-C7. Moderate left neural foraminal narrowing. No convincing disc herniation. Upper chest: No acute or significant abnormality. Other: None. IMPRESSION: HEAD CT 1. No acute intracranial abnormalities. CERVICAL CT 1. No fracture or acute finding. Electronically Signed   By: Lajean Manes M.D.   On: 10/08/2021 11:50   CT Cervical Spine Wo Contrast  Result Date: 10/08/2021 CLINICAL DATA:  [place] in  bathroom this morning. Low back pain. Patient reportedly did not hit his head. EXAM: CT HEAD WITHOUT CONTRAST CT CERVICAL SPINE WITHOUT CONTRAST TECHNIQUE: Multidetector CT imaging of the head and cervical spine was performed following the standard protocol without intravenous contrast. Multiplanar CT image reconstructions of the cervical spine were also generated. RADIATION DOSE REDUCTION: This exam was performed according to the departmental dose-optimization program which includes automated exposure control, adjustment of the mA and/or kV according to patient size and/or use of iterative reconstruction technique. COMPARISON:  08/13/2021. FINDINGS: CT HEAD FINDINGS Brain: No evidence of acute infarction, hemorrhage, hydrocephalus, extra-axial collection or mass lesion/mass effect. Vascular: No hyperdense vessel or unexpected calcification. Skull: Normal. Negative for fracture or focal lesion. Sinuses/Orbits: Globes and orbits are unremarkable. Sinuses are clear. Other: None. CT CERVICAL SPINE FINDINGS Alignment: Normal. Skull base and vertebrae: No acute fracture. No primary bone lesion or focal pathologic process. Soft tissues and spinal canal: No prevertebral fluid or swelling. No visible canal hematoma. Disc levels: Minor loss of disc height at C5-C6. Moderate loss of disc height at C6-C7. Endplate spurring and mild disc bulging noted at C6-C7. Moderate left neural foraminal narrowing. No convincing disc herniation. Upper chest: No acute or significant abnormality. Other: None. IMPRESSION: HEAD CT 1. No acute intracranial abnormalities. CERVICAL CT 1. No fracture or acute finding. Electronically Signed   By: Lajean Manes M.D.   On: 10/08/2021 11:50   CT Thoracic Spine Wo Contrast  Result Date: 10/08/2021 CLINICAL DATA:  Fall in bathroom with low back pain EXAM: CT THORACIC AND LUMBAR SPINE WITHOUT CONTRAST TECHNIQUE: Multidetector CT imaging of the thoracic and lumbar spine was performed without contrast.  Multiplanar CT image reconstructions were also generated. RADIATION DOSE REDUCTION: This exam was performed according to the departmental dose-optimization program which includes automated exposure control, adjustment of the mA and/or kV according to patient size and/or use of iterative reconstruction technique. COMPARISON:  None Available. FINDINGS: CT THORACIC SPINE FINDINGS Alignment: Exaggerated thoracic kyphosis.  No traumatic malalignment Vertebrae: Bridging osteophytes span T2 to the lumbar spine. No superimposed fracture is seen. Generalized osteopenia. Paraspinal and other soft tissues: No paraspinal hematoma or masslike finding. Disc levels: No visible herniation or impingement CT LUMBAR SPINE FINDINGS Segmentation: 5 lumbar type vertebrae Alignment: Normal Vertebrae: Rigid spine from T2-L2 with fracture through the anterior superior corner of L2 also traversing an anterior intervertebral osteophyte at L1-2. No displacement or visible posterior element involvement at this level. Additional fractures through anterior superior L4 endplate osteophyte. Paraspinal and other soft tissues: Mild paravertebral at edema at the level of fractures. Disc levels: Generalized disc bulging without focal high-grade stenosis is seen. IMPRESSION: 1. L2 anterior superior corner fracture also involving a L1-2 bridging osteophyte. No listhesis or visible posterior element involvement. 2. L4 anterior superior osteophyte fracture. 3. Rigid spine from bridging osteophytes at T2-L2. Electronically Signed   By: Jorje Guild M.D.   On: 10/08/2021 11:52   CT Lumbar Spine Wo Contrast  Result Date: 10/08/2021 CLINICAL DATA:  Fall  in bathroom with low back pain EXAM: CT THORACIC AND LUMBAR SPINE WITHOUT CONTRAST TECHNIQUE: Multidetector CT imaging of the thoracic and lumbar spine was performed without contrast. Multiplanar CT image reconstructions were also generated. RADIATION DOSE REDUCTION: This exam was performed according to  the departmental dose-optimization program which includes automated exposure control, adjustment of the mA and/or kV according to patient size and/or use of iterative reconstruction technique. COMPARISON:  None Available. FINDINGS: CT THORACIC SPINE FINDINGS Alignment: Exaggerated thoracic kyphosis.  No traumatic malalignment Vertebrae: Bridging osteophytes span T2 to the lumbar spine. No superimposed fracture is seen. Generalized osteopenia. Paraspinal and other soft tissues: No paraspinal hematoma or masslike finding. Disc levels: No visible herniation or impingement CT LUMBAR SPINE FINDINGS Segmentation: 5 lumbar type vertebrae Alignment: Normal Vertebrae: Rigid spine from T2-L2 with fracture through the anterior superior corner of L2 also traversing an anterior intervertebral osteophyte at L1-2. No displacement or visible posterior element involvement at this level. Additional fractures through anterior superior L4 endplate osteophyte. Paraspinal and other soft tissues: Mild paravertebral at edema at the level of fractures. Disc levels: Generalized disc bulging without focal high-grade stenosis is seen. IMPRESSION: 1. L2 anterior superior corner fracture also involving a L1-2 bridging osteophyte. No listhesis or visible posterior element involvement. 2. L4 anterior superior osteophyte fracture. 3. Rigid spine from bridging osteophytes at T2-L2. Electronically Signed   By: Jorje Guild M.D.   On: 10/08/2021 11:52   DG Chest Portable 1 View  Result Date: 10/08/2021 CLINICAL DATA:  Status post fall EXAM: PORTABLE CHEST - 1 VIEW COMPARISON:  07/26/2016 FINDINGS: Apparent cardiomegaly most likely related to extra tori phase of imaging and portable technique. No pulmonary vascular congestion.  Lungs clear.  No pneumothorax. IMPRESSION: No acute cardiopulmonary process. Electronically Signed   By: Miachel Roux M.D.   On: 10/08/2021 11:55    Procedures .Critical Care Performed by: Jeanell Sparrow,  DO Authorized by: Jeanell Sparrow, DO   Critical care provider statement:    Critical care time (minutes):  41   Critical care time was exclusive of:  Separately billable procedures and treating other patients   Critical care was necessary to treat or prevent imminent or life-threatening deterioration of the following conditions:  Trauma and cardiac failure   Critical care was time spent personally by me on the following activities:  Development of treatment plan with patient or surrogate, discussions with consultants, evaluation of patient's response to treatment, examination of patient, ordering and review of laboratory studies, ordering and review of radiographic studies, ordering and performing treatments and interventions, pulse oximetry, re-evaluation of patient's condition, review of old charts and obtaining history from patient or surrogate    Medications Ordered in ED Medications  sodium bicarbonate 75 mEq in sodium chloride 0.45 % 1,075 mL infusion (has no administration in time range)  insulin aspart (novoLOG) injection 0-15 Units (has no administration in time range)  hydrALAZINE (APRESOLINE) injection 10 mg (has no administration in time range)  prochlorperazine (COMPAZINE) injection 10 mg (has no administration in time range)  HYDROmorphone (DILAUDID) injection 0.5 mg (0.5 mg Intravenous Given 10/08/21 1653)  Tdap (BOOSTRIX) injection 0.5 mL (0.5 mLs Intramuscular Given 10/08/21 1053)  sodium chloride 0.9 % bolus 500 mL (0 mLs Intravenous Stopped 10/08/21 1156)  fentaNYL (SUBLIMAZE) injection 25 mcg (25 mcg Intravenous Given 10/08/21 1054)  ondansetron (ZOFRAN) injection 4 mg (4 mg Intravenous Given 10/08/21 1054)  bacitracin 500 UNIT/GM ointment (1 application.  Given 10/08/21 1054)  HYDROmorphone (DILAUDID) injection 1  mg (1 mg Intravenous Given 10/08/21 1245)  sodium chloride 0.9 % bolus 1,000 mL (1,000 mLs Intravenous New Bag/Given 10/08/21 1245)    ED Course/ Medical Decision  Making/ A&P Clinical Course as of 10/08/21 1729  Mon Oct 08, 2021  1206 IMPRESSION: 1. L2 anterior superior corner fracture also involving a L1-2 bridging osteophyte. No listhesis or visible posterior element involvement. 2. L4 anterior superior osteophyte fracture. 3. Rigid spine from bridging osteophytes at T2-L2.   [SG]  [id number] Spoke with neurosurgery, recommend TLSO brace, outpatient follow-up with neurosurgery clinic. [SG]    Clinical Course User Index [SG] Jeanell Sparrow, DO                           Medical Decision Making Amount and/or Complexity of Data Reviewed Labs: ordered. Radiology: ordered and independent interpretation performed. ECG/medicine tests: ordered and independent interpretation performed.  Risk Prescription drug management. Decision regarding hospitalization.    CC: fall, back pain  This patient presents to the Emergency Department for the above complaint. This involves an extensive number of treatment options and is a complaint that carries with it a high risk of complications and morbidity. Vital signs were reviewed. Serious etiologies considered.  Differential diagnosis includes but is not exclusive to musculoskeletal back pain, fx, trauma, renal colic, urinary tract infection, pyelonephritis, intra-abdominal causes of back pain, aortic aneurysm or dissection, cauda equina syndrome, sciatica, lumbar disc disease, thoracic disc disease, etc.   Record review:  Previous records obtained and reviewed  Prior visits, prior labs and imaging, prior office visits Patient to ED for last month secondary to fall.  Additional history obtained from [facility] and surgical history as noted above.   Work up as above, notable for:  Labs & imaging results that were available during my care of the patient were visualized by me and considered in my medical decision making.   I ordered imaging studies which included CT head, C-spine, chest x-ray, pelvis x-ray,  right wrist x-ray. I visualized the imaging, interpreted images, and I agree with radiologist interpretation. Compression fx to lumbar spine. No neuro deficits.   Cardiac monitoring reviewed and interpreted personally which shows NSR  Labs reviewed CPK is elevated c/w traumatic rhabdomyolysis, renal function is mildly worsened from his baseline. K is stable. Liver enzymes and alk phos are elevated, favor this is likely 2/2 his traumatic rhabdomyolysis, he has no abd pain. Trop is elevated, no CP; ECG without acute ischemic changes. Burtis Junes this is also 2/2 rhabdomyolysis. Would recommending trending this. Delta trop is flat.   Spoke with NSGY, recommend TLSO brace which has been ordered and o/p f/u.  Management: tetanus updated, analgesics, IV fluids  Reassessment:  Pt reeling better after analgesics. Given findings as above would recommend admission, pt is agreeable.   Admission was considered.    Spoke with Dr Marylyn Ishihara who accepts pt for admission.             Social determinants of health include -  Lives w/ spouse, ambulates w/o assistive devices.  Social History   Socioeconomic History   Marital status: Married    Spouse name: Not on file   Number of children: 3   Years of education: Not on file   Highest education level: Not on file  Occupational History   Not on file  Tobacco Use   Smoking status: Never   Smokeless tobacco: Never  Vaping Use   Vaping Use: Never used  Substance and Sexual Activity   Alcohol use: Not Currently    Comment: very little    Drug use: No   Sexual activity: Not Currently  Other Topics Concern   Not on file  Social History Narrative   Not on file   Social Determinants of Health   Financial Resource Strain: Not on file  Food Insecurity: Not on file  Transportation Needs: Not on file  Physical Activity: Not on file  Stress: Not on file  Social Connections: Not on file  Intimate Partner Violence: Not on file      This chart  was dictated using voice recognition software.  Despite best efforts to proofread,  errors can occur which can change the documentation meaning.         Final Clinical Impression(s) / ED Diagnoses Final diagnoses:  Traumatic rhabdomyolysis, initial encounter [facility])  Fall, initial encounter  Other closed fracture of second lumbar vertebra, initial encounter ([place])  Elevated troponin    Rx / DC Orders ED Discharge Orders     None         Jeanell Sparrow, DO 10/08/21 1729

## 2021-10-08 NOTE — ED Triage Notes (Addendum)
Pt coming from home via EMS with c/o of fall. Pt reports legs gave out and he fell on hardwood floor where he stayed for about ten hours. Pt reports his blood sugar may have been low when he fell. Pt reports hitting head, no LOC, no blood thinners. Pt reports low back pain.  Skin tear to right hand. Pt appears pale, cap refill >2.   CBG 286 with EMS 500 cc NS bolus

## 2021-10-08 NOTE — H&P (Signed)
History and Physical    Patient: [name] BWL:[id number] DOB: 01-Dec-1940 DOA: 10/08/2021 DOS: the patient was seen and examined on 10/08/2021 PCP: Burnard Bunting, MD  Patient coming from: Home  Chief Complaint:  Chief Complaint  Patient presents with   Fall   Weakness   HPI: [name] is a 81 y.o. male with medical history significant of MGUS, CKD4 s/p right nephrectomy, HTN, HLD, CAD. Presenting after a fall. Last night he was getting out of bed and fell as he was getting his feet settled on the floor. He ended up pushing the bed out from under himself. He fell to the floor, but did not hit his head. Neither did he pass out. He says this happened at 10p, but his wife is not sure about that time as she was still up at 10:45 and heard nothing. He was unable to get up on his own. So, he laid on the floor until 10a this morning when he called for his wife. He denies CP, palpitations, lightheadedness, dizziness. His wife denies him reporting any symptoms of concern at any time yesterday. His wife called for EMS and he was brought to the ED for evaluation. They deny any other aggravating or alleviating factors.   Review of Systems: As mentioned in the history of present illness. All other systems reviewed and are negative. Past Medical History:  Diagnosis Date   Arthritis    "little in my right hand; some in my right big toe" (10/22/2016)   Borderline diabetes    "tx'd w/RX; blood surgars dropped too low; dr. 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 at [facility] in [place] ([place]) 2006   s/p brachytherapy   Seborrheic keratosis    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   history of nephrectomy in 1984   Past Surgical History:  Procedure Laterality Date   [place]  09/26/2016; 10/22/2016   "4 stents; 2 stents"   CORONARY STENT INTERVENTION Right 09/26/2016   Procedure: Coronary Stent Intervention;  Surgeon: Adrian Prows, MD;  Location: [place] [facility];  Service: Cardiovascular;  Laterality: Right;   CORONARY STENT INTERVENTION N/A 10/22/2016   Procedure: Coronary Stent Intervention;  Surgeon: Adrian Prows, MD;  Location: [place] [facility];  Service: Cardiovascular;  Laterality: N/A;   CYSTOSCOPY/URETEROSCOPY/HOLMIUM LASER/STENT PLACEMENT Left 11/29/2018   Procedure: CYSTOSCOPY/URETEROSCOPY/HOLMIUM LASER/STENT PLACEMENTleft retrograde pylegram;  Surgeon: Ardis Hughs, MD;  Location: [facility];  Service: Urology;  Laterality: Left;   HOLMIUM LASER APPLICATION  6/81/1572   Procedure: HOLMIUM LASER APPLICATION;  Surgeon: Ardis Hughs, MD;  Location: [facility];  Service: Urology;;   INSERTION PROSTATE RADIATION SEED  2009?   LEFT HEART CATH AND CORONARY ANGIOGRAPHY N/A 09/26/2016   Procedure: Left Heart Cath and Coronary Angiography;  Surgeon: Adrian Prows, MD;  Location: [place] [facility];  Service: Cardiovascular;  Laterality: N/A;   NEPHRECTOMY Right  [place]  04/2016   "tore all the ligaments; had to put 3 pins in to reattach; Dr. Gladstone Lighter"   TONSILLECTOMY     Social History:  reports that he has never smoked. He has never used smokeless tobacco. He reports that he does not currently use alcohol. He reports that he does not use drugs.  Allergies  Allergen Reactions   Sulfa Antibiotics Hives   Morphine And Related Other (See Comments)    Spontaneous body movements   Lisinopril Cough    Family History   Problem Relation Age of Onset   Diabetes Mother    Heart attack Father     Prior to Admission medications   Medication Sig Start Date End Date Taking? Authorizing Provider  Ascorbic Acid (C-1000 SR) 1000 MG TBCR Take 1 tablet by mouth daily. 07/29/18   [provider]  aspirin EC 81 MG tablet Take 81 mg by mouth daily.    [provider]  BYSTOLIC 20 MG TABS Take 10 mg by mouth daily. daily    [provider]  Finerenone (KERENDIA) 10 MG TABS Take 1 tablet by mouth daily. 06/27/21   Adrian Prows, MD  glimepiride (AMARYL) 2 MG tablet Take 1 tablet (2 mg total) by mouth daily with breakfast. Reduce the dose to half until  resumed by your primary care doctor 12/02/18   Antonieta Pert, MD  isosorbide mononitrate (IMDUR) 60 MG 24 hr tablet Take 1 tablet (60 mg total) by mouth daily. 06/27/21   Adrian Prows, MD  neomycin-polymyxin-dexamethasone (MAXITROL) 0.1 % ophthalmic suspension at bedtime.    [provider]  nitroGLYCERIN (NITROSTAT) 0.4 MG SL tablet Place 1 tablet (0.4 mg total) under the tongue every 5 (five) minutes x 3 doses as needed for chest pain. 06/27/21   Adrian Prows, MD  Olmesartan-amLODIPine-HCTZ 20-5-12.5 MG TABS Take 1 tablet by mouth daily. 06/27/21   Adrian Prows, MD  rosuvastatin (CRESTOR) 20 MG tablet Take 1 tablet (20 mg total) by mouth at bedtime. 06/27/21   Adrian Prows, MD    Physical Exam: Vitals:   10/08/21 1300 10/08/21 1315 10/08/21 1330 10/08/21 1345  BP: 139/69 138/72 138/68 (!) 145/73  Pulse: 79 80 81 80  Resp: (!) 26 15 (!) 21 19  Temp:      TempSrc:      SpO2: (!) 89% 100% 92% 95%  Weight:      Height:       General: 81 y.o. male resting in bed in NAD Eyes: PERRL, normal sclera ENMT: Nares patent w/o discharge, orophaynx clear, dentition normal, ears w/o discharge/lesions/ulcers Neck: Supple, trachea midline Cardiovascular: RRR, +S1, S2, no m/g/r, equal pulses throughout Respiratory: CTABL, no w/r/r, normal WOB GI: BS+, NDNT, no  masses noted, no organomegaly noted MSK: No e/c/c Neuro: A&O x 3, no focal deficits, chronic tremor Psyc: somewhat confused after receiving pain meds, but calm/cooperative  Data Reviewed:  Na+  140 CO2  19 Glucose  260 BUN  48 Scr  3.15 Ca2+  8.5 Alk phos  342 Albumin 2.8 AST  165 ALT  84 CPK  3434 Trp  61 -> 66  CTH 1. No acute intracranial abnormalities.  CT cervical spine 1. No fracture or acute finding.  CT thoracic and lumbar spine 1. L2 anterior superior corner fracture also involving a L1-2 bridging osteophyte. No listhesis or visible posterior element involvement. 2. L4 anterior superior osteophyte fracture. 3. Rigid spine from bridging osteophytes at T2-L2.  CXR: No acute cardiopulmonary process.  XR right wrist No acute abnormality of the right wrist.  XR pelvis No acute fracture or dislocation of the pelvis.  EKG: sinus, no st elevations  Assessment and Plan: Fall Lumbar spinal fractures     - admit to inpt, tele     - EDP spoke with neurosurgery; imagines reviewed, rec'd TSLO brace and outpt follow up     - PT/OT eval  Rhabdomyolysis     - fluids, trend CPK  AKI on CKD4     - s/p right nephrectomy     - check renal US     - watching nephrotoxins     - fluids as above  HTN     - continue home regimen when confirmed  HLD CAD     - continue home regimen when confirmed  MGUS     - continue outpt follow up  Elevated LFTs     - check hepatitis panel, RUQ ab [place]     - LFTs were elevated earlier this month  DM2     - check A1c     - SSI, glucose checks, DM diet  GERD     - PPI  Advance Care Planning:   Code Status: FULL  Consults: EDP spoke with neurosurgery  Family Communication: w/ wife at bedside  Severity of Illness: The appropriate patient status for this patient is INPATIENT. Inpatient status is judged to be reasonable and necessary in order to provide the required intensity of service to ensure the patient's safety. The  patient's presenting symptoms, physical exam findings, and initial radiographic and laboratory data in the context of their chronic comorbidities is felt to place them at high risk for further clinical deterioration. Furthermore, it is not anticipated that the patient will be medically stable for discharge from the hospital within 2 midnights of admission.   * I certify that at the point of admission it is my clinical judgment that the patient will require inpatient hospital care spanning beyond 2 midnights from the point of admission due to high intensity of service, high risk for further deterioration and high frequency of surveillance required.*  Author: Jonnie Finner, DO 10/08/2021 2:09 PM  For on call review [email or web address].

## 2021-10-09 ENCOUNTER — Inpatient Hospital Stay (HOSPITAL_COMMUNITY): Payer: Medicare Other

## 2021-10-09 DIAGNOSIS — R7989 Other specified abnormal findings of blood chemistry: Secondary | ICD-10-CM

## 2021-10-09 DIAGNOSIS — N184 Chronic kidney disease, stage 4 (severe): Secondary | ICD-10-CM | POA: Diagnosis not present

## 2021-10-09 DIAGNOSIS — M6282 Rhabdomyolysis: Secondary | ICD-10-CM

## 2021-10-09 DIAGNOSIS — S32009A Unspecified fracture of unspecified lumbar vertebra, initial encounter for closed fracture: Secondary | ICD-10-CM | POA: Diagnosis not present

## 2021-10-09 DIAGNOSIS — R160 Hepatomegaly, not elsewhere classified: Secondary | ICD-10-CM

## 2021-10-09 DIAGNOSIS — I10 Essential (primary) hypertension: Secondary | ICD-10-CM | POA: Diagnosis not present

## 2021-10-09 LAB — URINALYSIS, ROUTINE W REFLEX MICROSCOPIC
Bilirubin Urine: NEGATIVE
Glucose, UA: NEGATIVE mg/dL
Ketones, ur: NEGATIVE mg/dL
Leukocytes,Ua: NEGATIVE
Nitrite: NEGATIVE
Protein, ur: 100 mg/dL — AB
Specific Gravity, Urine: 1.025 (ref 1.005–1.030)
pH: 6 (ref 5.0–8.0)

## 2021-10-09 LAB — CBC
HCT: 32.9 % — ABNORMAL LOW (ref 39.0–52.0)
Hemoglobin: 10.3 g/dL — ABNORMAL LOW (ref 13.0–17.0)
MCH: 27.5 pg (ref 26.0–34.0)
MCHC: 31.3 g/dL (ref 30.0–36.0)
MCV: 87.7 fL (ref 80.0–100.0)
Platelets: 113 10*3/uL — ABNORMAL LOW (ref 150–400)
RBC: 3.75 MIL/uL — ABNORMAL LOW (ref 4.22–5.81)
RDW: 15.9 % — ABNORMAL HIGH (ref 11.5–15.5)
WBC: 12.5 10*3/uL — ABNORMAL HIGH (ref 4.0–10.5)
nRBC: 0 % (ref 0.0–0.2)

## 2021-10-09 LAB — COMPREHENSIVE METABOLIC PANEL
ALT: 69 U/L — ABNORMAL HIGH (ref 0–44)
AST: 118 U/L — ABNORMAL HIGH (ref 15–41)
Albumin: 2.5 g/dL — ABNORMAL LOW (ref 3.5–5.0)
Alkaline Phosphatase: 299 U/L — ABNORMAL HIGH (ref 38–126)
Anion gap: 8 (ref 5–15)
BUN: 49 mg/dL — ABNORMAL HIGH (ref 8–23)
CO2: 22 mmol/L (ref 22–32)
Calcium: 8.1 mg/dL — ABNORMAL LOW (ref 8.9–10.3)
Chloride: 111 mmol/L (ref 98–111)
Creatinine, Ser: 3 mg/dL — ABNORMAL HIGH (ref 0.61–1.24)
GFR, Estimated: 20 mL/min — ABNORMAL LOW (ref >60)
Glucose, Bld: 119 mg/dL — ABNORMAL HIGH (ref 70–99)
Potassium: 4.6 mmol/L (ref 3.5–5.1)
Sodium: 141 mmol/L (ref 135–145)
Total Bilirubin: 1.1 mg/dL (ref 0.3–1.2)
Total Protein: 5.9 g/dL — ABNORMAL LOW (ref 6.5–8.1)

## 2021-10-09 LAB — GLUCOSE, CAPILLARY
Glucose-Capillary: 101 mg/dL — ABNORMAL HIGH (ref 70–99)
Glucose-Capillary: 128 mg/dL — ABNORMAL HIGH (ref 70–99)
Glucose-Capillary: 161 mg/dL — ABNORMAL HIGH (ref 70–99)
Glucose-Capillary: 97 mg/dL (ref 70–99)

## 2021-10-09 LAB — CK: Total CK: 1267 U/L — ABNORMAL HIGH (ref 49–397)

## 2021-10-09 IMAGING — MR MR ABDOMEN W/O CM
9 series · 48 of 48 positions shown · non-contrast
Comparison: Ultrasound [DATE]. Abdominal CT of [DATE]. PET
of [DATE].

CLINICAL DATA: Liver mass on ultrasound. History of melanoma and
right nephrectomy for renal cell carcinoma. Acute renal
insufficiency. Dementia.

EXAM:
MRI ABDOMEN WITHOUT CONTRAST
TECHNIQUE: Multiplanar multisequence MR imaging was performed without the
administration of intravenous contrast.

[Series 3: T2 · coronal · 6.0mm · 1.56mm/px · 2 of 30 slices shown]
[im 1/30]
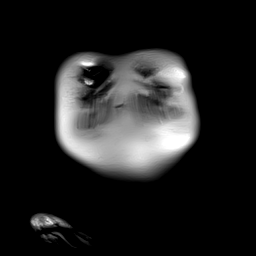
[im 30/30]
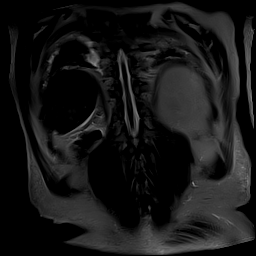

[Series 4: T2 fat-sat · 1 of 7 slices shown (1 of 2)]
[im 1/7]
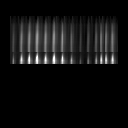

[Series 5: T2 fat-sat · axial · 6.0mm · 1.25mm/px · z∈[-177,+104]mm · 3 of 40 slices shown (2 of 2)]
[im 1/40]
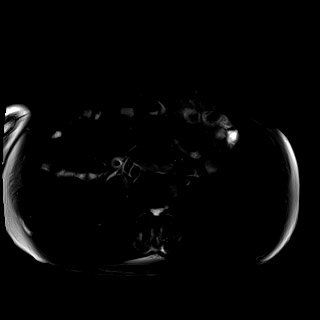
[im 20/40]
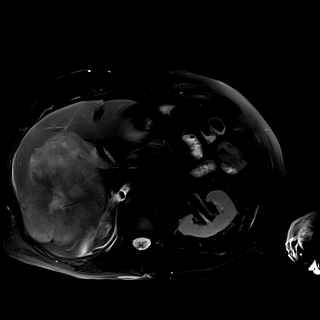
[im 40/40]
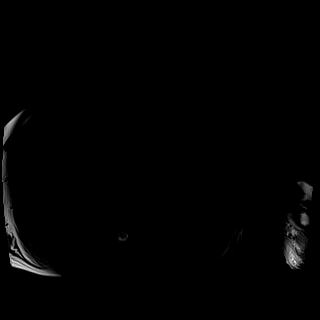

[Series 6: T1 · axial · 3.0mm · 1.25mm/px · z∈[-169,+92]mm · 8 of 88 slices shown (1 of 2)]
[im 1/88]
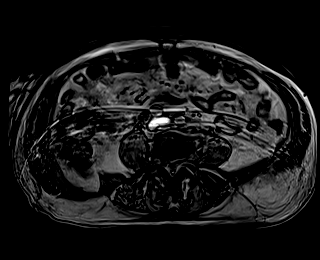
[im 13/88]
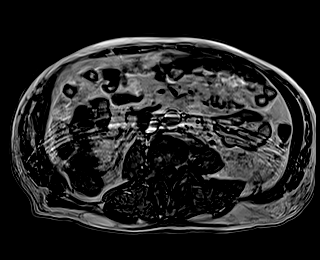
[im 25/88]
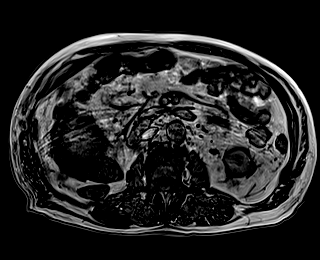
[im 38/88]
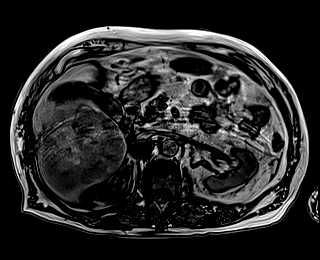
[im 50/88]
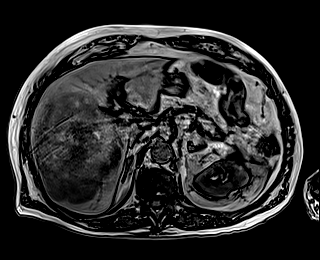
[im 63/88]
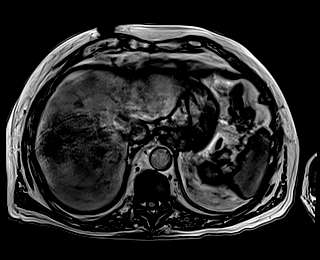
[im 75/88]
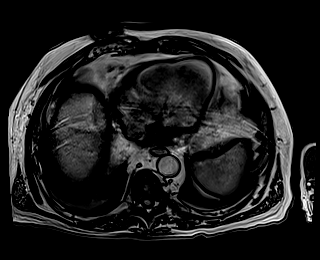
[im 88/88]
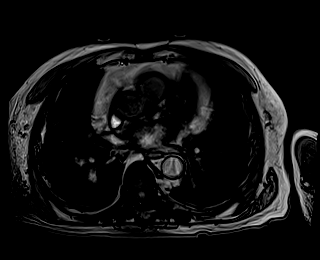

[Series 7: T1 · axial · 3.0mm · 1.25mm/px · z∈[-169,+92]mm · 8 of 88 slices shown (2 of 2)]
[im 1/88]
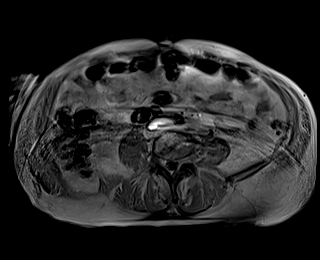
[im 13/88]
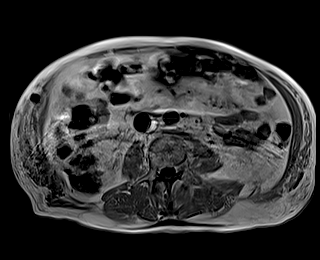
[im 25/88]
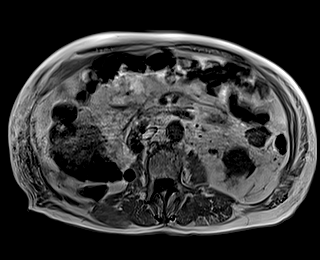
[im 38/88]
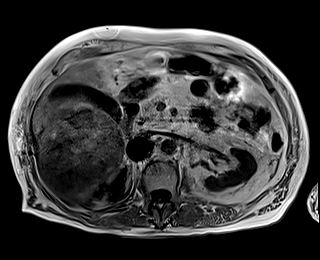
[im 50/88]
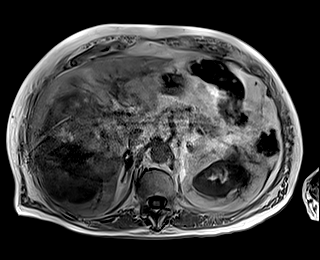
[im 63/88]
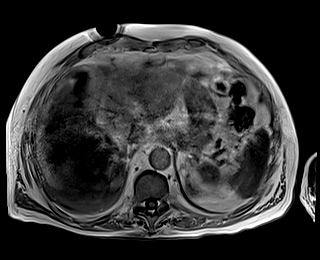
[im 75/88]
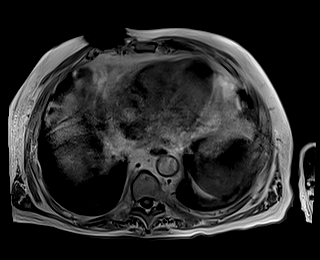
[im 88/88]
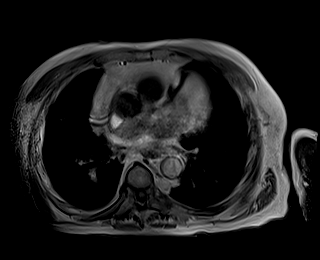

[Series 8: DWI · axial · 6.0mm · 1.49mm/px · z∈[-188,+92]mm · 7 of 80 slices shown (1 of 2)]
[im 1/80]
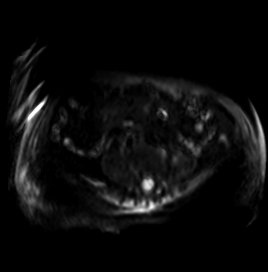
[im 14/80]
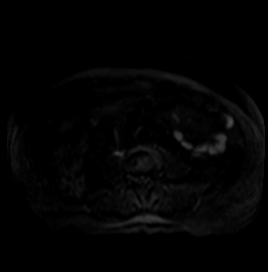
[im 27/80]
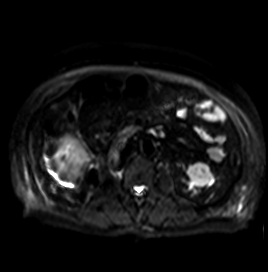
[im 40/80]
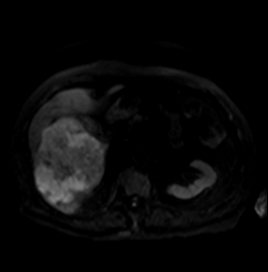
[im 53/80]
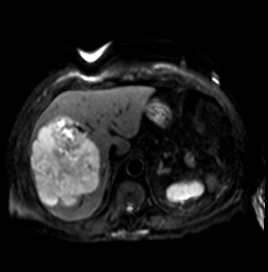
[im 66/80]
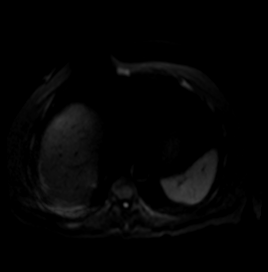
[im 80/80]
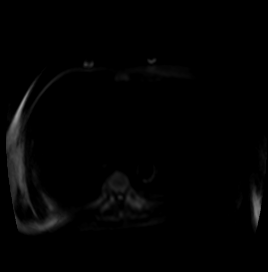

[Series 9: DWI · axial · 6.0mm · 1.49mm/px · z∈[-188,+92]mm · 4 of 40 slices shown (2 of 2)]
[im 1/40]
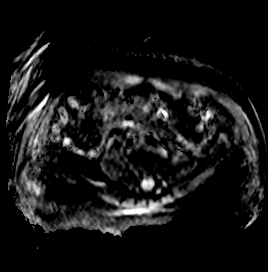
[im 14/40]
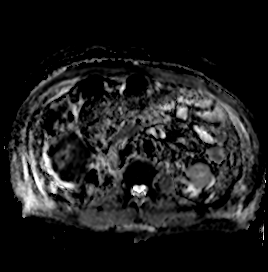
[im 27/40]
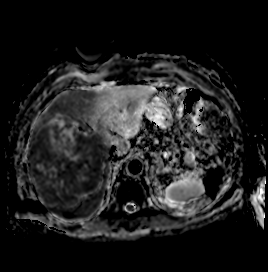
[im 40/40]
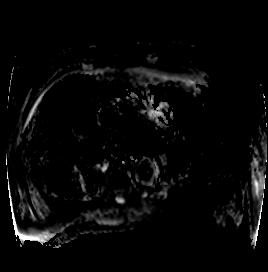

[Series 10: bSSFP · axial · 4.0mm · 0.84mm/px · z∈[-171,+89]mm · 6 of 66 slices shown]
[im 1/66]
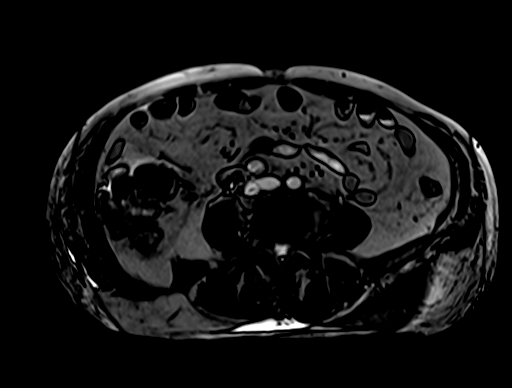
[im 14/66]
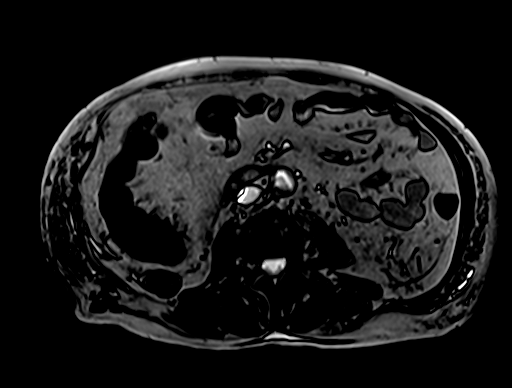
[im 27/66]
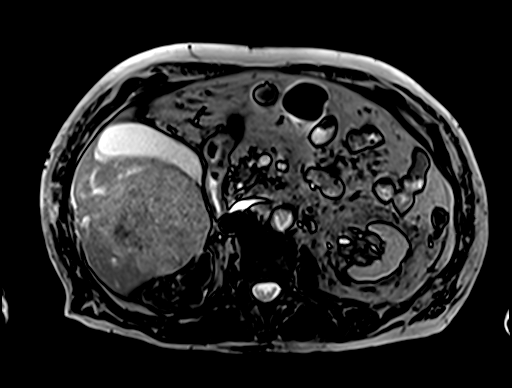
[im 40/66]
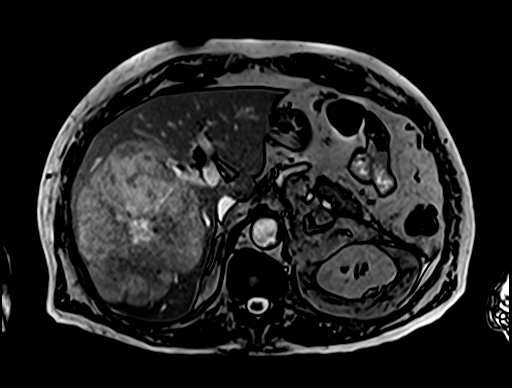
[im 53/66]
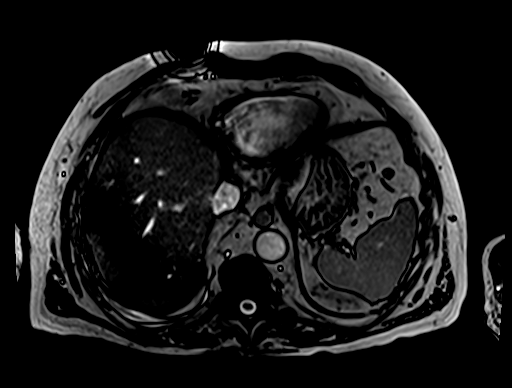
[im 66/66]
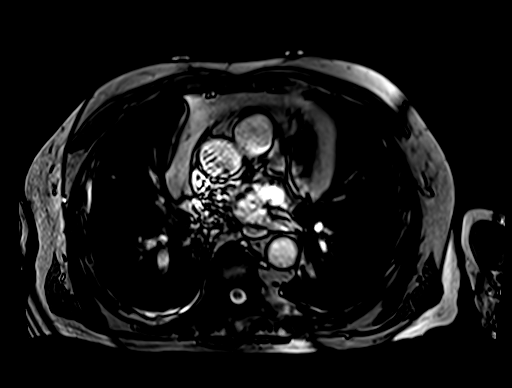

[Series 12: T1 dynamic · axial · 3.0mm · 1.25mm/px · z∈[-187,+98]mm · 9 of 96 slices shown]
[im 1/96]
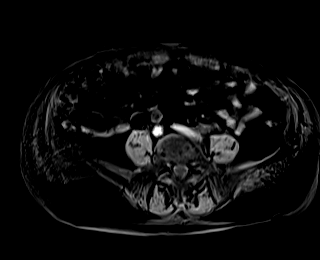
[im 12/96]
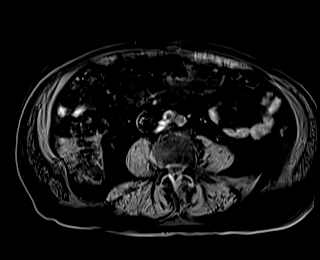
[im 24/96]
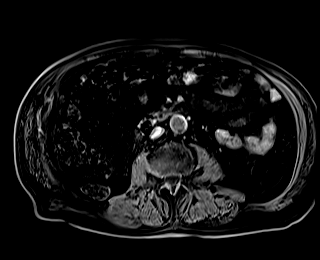
[im 36/96]
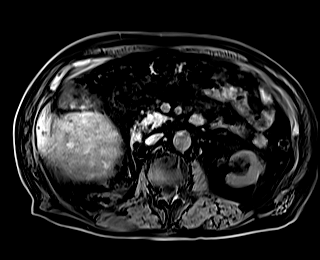
[im 48/96]
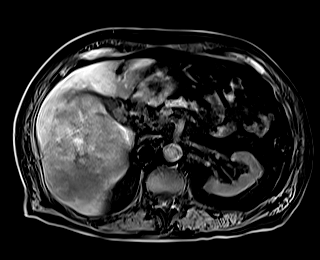
[im 60/96]
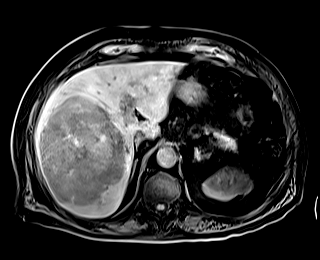
[im 72/96]
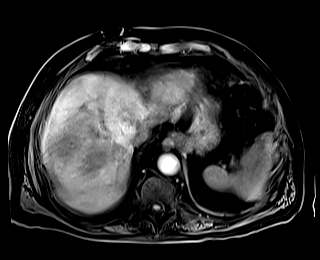
[im 84/96]
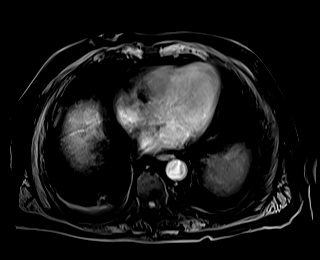
[im 96/96]
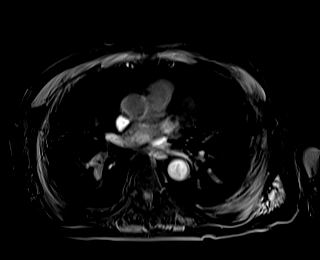

[48 of 48 positions shown; findings below may reference images not displayed]

FINDINGS: Exam is mildly degraded by lack of IV contrast and mild motion.

Lower chest: Small right pleural effusion.  Mild cardiomegaly.

Hepatobiliary: No evidence of cirrhosis. Dominant right hepatic lobe
mass measures 10.5 x 12.5 x 12.7 cm on [DATE] and [DATE]. Demonstrates
heterogeneous primarily hyperintense T2 signal. Areas of precontrast
T1 hyperintensity throughout including on series 12.

Normal gallbladder, without biliary ductal dilatation.

Pancreas:  Normal, without mass or ductal dilatation.

Spleen:  Normal in size, without focal abnormality.

Adrenals/Urinary Tract: Normal right adrenal gland. Left renal
cortical thinning. Right nephrectomy, without local recurrence.

Stomach/Bowel: Normal stomach and abdominal bowel loops.

Vascular/Lymphatic: Normal caliber of the aorta and branch vessels.
No retroperitoneal or retrocrural adenopathy.

Other:  No ascites.

Musculoskeletal: No acute osseous abnormality.
IMPRESSION: 1. Dominant right hepatic lobe mass is new since the CT of
[DATE]. Especially given precontrast T1 hyperintensity (likely
BIBIANO) isolated melanoma metastasis is favored. Renal cell
carcinoma metastasis or primary malignancy such as hepatocellular
carcinoma felt less likely.
2. Decreased sensitivity and specificity exam due to technique
related factors, as described above.
3. Right nephrectomy, without local recurrence.
4. Small right pleural effusion.

## 2021-10-09 MED ORDER — AMLODIPINE BESYLATE 5 MG PO TABS
2.5000 mg | ORAL_TABLET | Freq: Every day | ORAL | Status: DC
  Administered 2021-10-09 – 2021-10-11 (×3): 2.5 mg via ORAL
  Filled 2021-10-09 (×3): qty 1

## 2021-10-09 MED ORDER — SODIUM CHLORIDE 0.45 % IV SOLN: INTRAVENOUS | Status: AC

## 2021-10-09 MED ORDER — HYDROMORPHONE HCL 1 MG/ML IJ SOLN
0.5000 mg | INTRAMUSCULAR | Status: DC | PRN
  Administered 2021-10-10: 0.5 mg via INTRAVENOUS
  Filled 2021-10-09: qty 0.5

## 2021-10-09 MED ORDER — LIP MEDEX EX OINT
1.0000 "application " | TOPICAL_OINTMENT | CUTANEOUS | Status: DC | PRN
  Filled 2021-10-09: qty 7

## 2021-10-09 MED ORDER — LATANOPROST 0.005 % OP SOLN
1.0000 [drp] | Freq: Every day | OPHTHALMIC | Status: DC
  Administered 2021-10-09 – 2021-10-18 (×10): 1 [drp] via OPHTHALMIC
  Filled 2021-10-09 (×3): qty 2.5

## 2021-10-09 MED ORDER — ROSUVASTATIN CALCIUM 20 MG PO TABS
20.0000 mg | ORAL_TABLET | Freq: Every day | ORAL | Status: DC
  Administered 2021-10-09 – 2021-10-18 (×7): 20 mg via ORAL
  Filled 2021-10-09 (×9): qty 1

## 2021-10-09 MED ORDER — OXYCODONE-ACETAMINOPHEN 5-325 MG PO TABS
1.0000 | ORAL_TABLET | ORAL | Status: DC | PRN
  Administered 2021-10-09 – 2021-10-10 (×2): 2 via ORAL
  Administered 2021-10-10 – 2021-10-11 (×2): 1 via ORAL
  Filled 2021-10-09: qty 2
  Filled 2021-10-09 (×2): qty 1
  Filled 2021-10-09 (×2): qty 2

## 2021-10-09 MED ORDER — NEBIVOLOL HCL 10 MG PO TABS
10.0000 mg | ORAL_TABLET | Freq: Every day | ORAL | Status: DC
  Administered 2021-10-09 – 2021-10-19 (×10): 10 mg via ORAL
  Filled 2021-10-09 (×11): qty 1

## 2021-10-09 MED ORDER — ASPIRIN 81 MG PO TBEC
81.0000 mg | DELAYED_RELEASE_TABLET | Freq: Every day | ORAL | Status: DC
  Administered 2021-10-09 – 2021-10-11 (×3): 81 mg via ORAL
  Filled 2021-10-09 (×5): qty 1

## 2021-10-09 MED ORDER — DORZOLAMIDE HCL-TIMOLOL MAL 2-0.5 % OP SOLN
1.0000 [drp] | Freq: Two times a day (BID) | OPHTHALMIC | Status: DC
  Administered 2021-10-09 – 2021-10-22 (×24): 1 [drp] via OPHTHALMIC
  Filled 2021-10-09 (×3): qty 10

## 2021-10-09 MED ORDER — PANTOPRAZOLE SODIUM 40 MG PO TBEC
40.0000 mg | DELAYED_RELEASE_TABLET | Freq: Every day | ORAL | Status: DC
  Administered 2021-10-09 – 2021-10-11 (×3): 40 mg via ORAL
  Filled 2021-10-09 (×4): qty 1

## 2021-10-09 MED ORDER — ISOSORBIDE MONONITRATE ER 60 MG PO TB24
60.0000 mg | ORAL_TABLET | Freq: Every day | ORAL | Status: DC
  Administered 2021-10-09 – 2021-10-11 (×3): 60 mg via ORAL
  Filled 2021-10-09 (×4): qty 1

## 2021-10-09 NOTE — TOC Progression Note (Signed)
Transition of Care (TOC) - Progression Note    Patient Details  Name: [name] MRN: [id number] Date of Birth: 01/13/1941  Transition of Care (TOC) CM/SW Contact  , , LCSW Phone Number: 10/09/2021, 2:41 PM  Clinical Narrative:    Met with pt and wife today to review dc planning needs.  Wife aware that therapies have recommended SNF for rehab and is in full agreement.  States that she is in the home, however, pt's current assistance needs are to much for her to manage.  Pt in agreement with SNF plan as well.  Have begun SNF bed search and will start ins auth once facility accepted.   Expected Discharge Plan: Skilled Nursing Facility Barriers to Discharge: Continued Medical Work up, SNF Pending bed offer, Insurance Authorization  Expected Discharge Plan and Services Expected Discharge Plan: Skilled Nursing Facility In-house Referral: Clinical Social Work   Post Acute Care Choice: Skilled Nursing Facility Living arrangements for the past 2 months: Single Family Home                 DME Arranged: N/A DME Agency: NA                   Social Determinants of Health (SDOH) Interventions    Readmission Risk Interventions    10/09/2021    2:37 PM  Readmission Risk Prevention Plan  Transportation Screening Complete  PCP or Specialist Appt within 5-7 Days Complete  Home Care Screening Complete  Medication Review (RN CM) Complete    

## 2021-10-09 NOTE — Evaluation (Signed)
Occupational Therapy Evaluation Patient Details Name: [name] MRN: [id number] DOB: 02/22/1941 Today's Date: 10/09/2021   History of Present Illness 81 y.o. male with medical history significant of MGUS, CKD4 s/p right nephrectomy, HTN, HLD, CAD.  Patient presented after sustaining a fall at home.  Could not get up from the floor for several hours.  Was brought by EMS.  Found to have rhabdomyolysis.  Also found to have fracture involving the L2 vertebrae.  TLSO brace to room.   Clinical Impression   Patient is currently requiring assistance with ADLs including Total assist with bed level Lower body ADLs, Max-Total assist with Upper body ADLs,  as well as  Total assist of 2 people with bed mobility and Max assist of 2 people to stand from EOB with inability to pivot to recliner or commode.   Current level of function is below patient's typical baseline of being independent and cognitively intact.  During this evaluation, patient was limited by generalized weakness, impaired activity tolerance, and confusion/agitation at times with poor understanding of reason for TLSO and back precautions and with apparent 10/10 pain with all mobilization, all of which has the potential to impact patient's safety and independence during functional mobility, as well as performance for ADLs.  Patient lives at home, with his wife who is unable to provide the level of assistance that pt currently requires but can provide 24/7 supervision once pt is home.  Patient demonstrates good rehab potential, and should benefit from continued skilled occupational therapy services while in acute care to maximize safety, independence and quality of life at home.  Continued occupational therapy services in a SNF setting prior to return home is recommended.  ?     Recommendations for follow up therapy are one component of a multi-disciplinary discharge planning process, led by the attending physician.  Recommendations may be  updated based on patient status, additional functional criteria and insurance authorization.   Follow Up Recommendations  Skilled nursing-short term rehab (<3 hours/day)    Assistance Recommended at Discharge Frequent or constant Supervision/Assistance  Patient can return home with the following Two people to help with walking and/or transfers;A lot of help with walking and/or transfers;A lot of help with bathing/dressing/bathroom;Two people to help with bathing/dressing/bathroom;Assistance with cooking/housework;Assist for transportation;Direct supervision/assist for financial management;Direct supervision/assist for medications management;Help with stairs or ramp for entrance;Assistance with feeding    Functional Status Assessment  Patient has had a recent decline in their functional status and demonstrates the ability to make significant improvements in function in a reasonable and predictable amount of time.  Equipment Recommendations  Other (comment) (Will defer to post-acute recommendations)    Recommendations for Other Services       Precautions / Restrictions Precautions Precautions: Back;Fall Required Braces or Orthoses: Spinal Brace Spinal Brace: Thoracolumbosacral orthotic;Lumbar corset (Pt already wearing in bed.) Restrictions Weight Bearing Restrictions: No      Mobility Bed Mobility Overal bed mobility: Needs Assistance Bed Mobility: Rolling, Sidelying to Sit, Sit to Sidelying Rolling: Max assist Sidelying to sit: Total assist, +2 for physical assistance     Sit to sidelying: Total assist, +2 for physical assistance General bed mobility comments: Cues for reverse log roll.    Transfers Overall transfer level: Needs assistance                        Balance Overall balance assessment: History of Falls, Needs assistance Sitting-balance support: Bilateral upper extremity supported, Feet supported Sitting balance-Leahy Scale:  Fair     Standing  balance support: Bilateral upper extremity supported, During functional activity, Reliant on assistive device for balance Standing balance-Leahy Scale: Poor                             ADL either performed or assessed with clinical judgement   ADL Overall ADL's : Needs assistance/impaired Eating/Feeding: Maximal assistance;Bed level Eating/Feeding Details (indicate cue type and reason): Pt unable to grip water cup with either hand sufficiently and required hand over hand. Attempted guiding cup to pt's mouth but water spilling on pt, therefore pt had to release the cup and allow OT to bring to his mouth to sip. Grooming: Maximal assistance;Bed level   Upper Body Bathing: Maximal assistance;Bed level   Lower Body Bathing: Total assistance;+2 for physical assistance;Bed level   Upper Body Dressing : Maximal assistance;Bed level   Lower Body Dressing: Total assistance;+2 for physical assistance;Bed level   Toilet Transfer: +2 for physical assistance;Maximal assistance;Rolling walker (2 wheels);Cueing for sequencing;Cueing for safety Toilet Transfer Details (indicate cue type and reason): Pt stood from elevated EOB x 2 reps to RW with cues to follow back precautions, and Max assist of 2 people. Pt unable to take steps and lwoered to EOB each time with Max As of 2 due to poor eccentric control. Pt stood 3rd time to Denna Haggard with Max As of two and lowered to bed with Max As of 2. Toileting- Clothing Manipulation and Hygiene: Total assistance;Bed level       Functional mobility during ADLs: Maximal assistance;Rolling walker (2 wheels);Total assistance;+2 for physical assistance       Vision   Additional Comments: Unable to determine as pt distracted by pain.     Perception     Praxis      Pertinent Vitals/Pain Pain Assessment Pain Assessment: Faces Pain Score: 8  Faces Pain Scale: Hurts worst Pain Location: Lower back Pain Descriptors / Indicators: Constant Pain  Intervention(s): Limited activity within patient's tolerance, Monitored during session, Premedicated before session (Cues for relaxing muscles ad lib and Nursing notified after session of need for pain meds as pt was not yet due to more during therapy.)     Hand Dominance Left   Extremity/Trunk Assessment Upper Extremity Assessment Upper Extremity Assessment: RUE deficits/detail;LUE deficits/detail RUE Deficits / Details: Able to raise arm about 50 degrees off bed. Very guarded with movement. Grip strength ~4/5  Chronic jerking type of tremor that pt states his father also had. RUE: Unable to fully assess due to pain RUE Coordination: decreased fine motor LUE Deficits / Details: Able to raise arm about 50 degrees off bed. Very guarded with movement. Grip strength ~4/5. Chronic jerking type of tremor that pt states his father also had. LUE: Unable to fully assess due to pain LUE Coordination: decreased fine motor   Lower Extremity Assessment Lower Extremity Assessment: Defer to PT evaluation   Cervical / Trunk Assessment Cervical / Trunk Assessment: Kyphotic;Other exceptions Cervical / Trunk Exceptions: TLSO Black brace.   Communication Communication Communication: Other (comment) (Increased effort to speak due to pain)   Cognition Arousal/Alertness: Awake/alert Behavior During Therapy: WFL for tasks assessed/performed, Anxious, Agitated Overall Cognitive Status: Impaired/Different from baseline Area of Impairment: Orientation                 Orientation Level: Time                   General Comments  Exercises Other Exercises Other Exercises: Pt encouraged to begin moving UEs and LEs in bed ad lib to prepare for future mobilization. Pt required rest break but stated he would try later. Pt's brother in room.   Shoulder Instructions      Home Living Family/patient expects to be discharged to:: Private residence Living Arrangements: Spouse/significant  other Available Help at Discharge: Available 24 hours/day Type of Home: House Home Access: Stairs to enter [employer] of Steps: 1   Home Layout: One level     Bathroom Shower/Tub: Occupational psychologist: Handicapped height     Home Equipment: Conservation officer, nature (2 wheels)          Prior Functioning/Environment Prior Level of Function : Independent/Modified Independent;History of Falls (last six months)             Mobility Comments: drives,          OT Problem List: Decreased strength;Pain;Decreased cognition;Decreased activity tolerance;Decreased safety awareness;Impaired balance (sitting and/or standing);Decreased knowledge of use of DME or AE;Decreased knowledge of precautions;Impaired UE functional use      OT Treatment/Interventions: Self-care/ADL training;Therapeutic exercise;Therapeutic activities;Cognitive remediation/compensation;Patient/family education;Balance training;DME and/or AE instruction    OT Goals(Current goals can be found in the care plan section) Acute Rehab OT Goals Patient Stated Goal: Brother agreeable to plan for post-acute inpatient therapy prior to home. States that pt's wife cannot safely care for pt with his current level of function, OT Goal Formulation: With family Time For Goal Achievement: 10/23/21 Potential to Achieve Goals: Good ADL Goals Pt Will Perform Eating: sitting;with modified independence Pt Will Perform Grooming: with set-up;sitting Pt Will Perform Upper Body Dressing: with set-up;sitting Pt Will Perform Lower Body Dressing: with adaptive equipment;sitting/lateral leans;sit to/from stand;with min guard assist;with caregiver independent in assisting Pt Will Transfer to Toilet: with modified independence;ambulating Pt Will Perform Toileting - Clothing Manipulation and hygiene: with modified independence;with adaptive equipment;sitting/lateral leans;sit to/from stand Pt/caregiver will Perform Home Exercise  Program: Increased ROM;Both right and left upper extremity;With Supervision  OT Frequency: Min 2X/week    Co-evaluation PT/OT/SLP Co-Evaluation/Treatment: Yes Reason for Co-Treatment: For patient/therapist safety;To address functional/ADL transfers PT goals addressed during session: Mobility/safety with mobility;Balance OT goals addressed during session: ADL's and self-care;Proper use of Adaptive equipment and DME      AM-PAC OT "6 Clicks" Daily Activity     Outcome Measure Help from another person eating meals?: A Lot Help from another person taking care of personal grooming?: A Lot Help from another person toileting, which includes using toliet, bedpan, or urinal?: Total Help from another person bathing (including washing, rinsing, drying)?: Total Help from another person to put on and taking off regular upper body clothing?: A Lot Help from another person to put on and taking off regular lower body clothing?: Total 6 Click Score: 9   End of Session Equipment Utilized During Treatment: Gait belt;Back brace;Rolling walker (2 wheels) Nurse Communication: Mobility status;Patient requests pain meds  Activity Tolerance: Patient limited by pain;Treatment limited secondary to agitation Patient left: in bed;with call bell/phone within reach;with bed alarm set;with family/visitor present;with SCD's reapplied  OT Visit Diagnosis: Unsteadiness on feet (R26.81);History of falling (Z91.81);Muscle weakness (generalized) (M62.81);Pain;Other symptoms and signs involving cognitive function;Feeding difficulties (R63.3) Pain - part of body:  (lower back)                Time: 5784-6962 OT Time Calculation (min): 36 min Charges:  OT General Charges $OT Visit: 1 Visit OT Evaluation $OT Eval Moderate Complexity: 1  Sharon Mt, OT Acute Rehab Services Office: [phone] 10/09/2021  Julien Girt 10/09/2021, 10:40 AM

## 2021-10-09 NOTE — Evaluation (Addendum)
Physical Therapy Evaluation Patient Details Name: [name] MRN: [id number] DOB: [birth year] Today's Date: 10/09/2021  History of Present Illness  81 y.o. male with medical history significant of MGUS, CKD4 s/p right nephrectomy, HTN, HLD, CAD.  Patient presented after sustaining a fall at home.  Could not get up from the floor for several hours.  Was brought by EMS.  Found to have rhabdomyolysis. CT reveals liver Mass.   Also found to have fracture involving the L2 vertebrae.  TLSO brace to room.  Clinical Impression  The patient  presents with much  difficulty mobilizing, requiring +2 max/total to get to sitting and standing x 2. Patient required use of Denna Haggard lift equipment to a move up towards [facility] after sitting on bed edge.  Patient independent PTA and driving , resides with wife.  Pt admitted with above diagnosis.  Pt currently with functional limitations due to the deficits listed below (see PT Problem List). Pt will benefit from skilled PT to increase their independence and safety with mobility to allow discharge to the venue listed below.   Patient expressing 10/10 pain in  his back with mobility. RN notified.  TLSO in place on patient         Recommendations for follow up therapy are one component of a multi-disciplinary discharge planning process, led by the attending physician.  Recommendations may be updated based on patient status, additional functional criteria and insurance authorization.  Follow Up Recommendations Skilled nursing-short term rehab (<3 hours/day)    Assistance Recommended at Discharge Frequent or constant Supervision/Assistance  Patient can return home with the following  Two people to help with walking and/or transfers;Assistance with cooking/housework;A lot of help with bathing/dressing/bathroom;Assist for transportation;Help with stairs or ramp for entrance    Equipment Recommendations None recommended by PT  Recommendations for Other  Services       Functional Status Assessment Patient has had a recent decline in their functional status and demonstrates the ability to make significant improvements in function in a reasonable and predictable amount of time.     Precautions / Restrictions Precautions Precautions: Back;Fall Required Braces or Orthoses: Spinal Brace Spinal Brace: Thoracolumbosacral orthotic;Applied in supine position Other Brace: must wear  all the time Restrictions Weight Bearing Restrictions: No      Mobility  Bed Mobility   Bed Mobility: Rolling, Sidelying to Sit, Sit to Sidelying           General bed mobility comments: Cues for reverse log roll.    Transfers Overall transfer level: Needs assistance Equipment used: Rolling walker (2 wheels) Transfers: Sit to/from Stand Sit to Stand: Max assist, +2 physical assistance, +2 safety/equipment, From elevated surface           General transfer comment: stood briefly at [place], unable to step, right leg tends to be Externally rotated . Stood in [place] stedy with max assist of 2 to rise. Flaps down for patient sit while  moving   patient towards [place] while seated in [place].    Ambulation/Gait                  Stairs            Wheelchair Mobility    Modified Rankin (Stroke Patients Only)       Balance Overall balance assessment: History of Falls, Needs assistance Sitting-balance support: Bilateral upper extremity supported, Feet supported Sitting balance-Leahy Scale: Fair     Standing balance support: Bilateral upper extremity supported, During functional activity, Reliant  on assistive device for balance Standing balance-Leahy Scale: Poor                               Pertinent Vitals/Pain Pain Assessment Faces Pain Scale: Hurts worst Pain Location: Lower back Pain Descriptors / Indicators: Constant Pain Intervention(s): Monitored during session, Premedicated before session, Repositioned, Patient  requesting pain meds-RN notified, Limited activity within patient's tolerance    Home Living Family/patient expects to be discharged to:: Private residence Living Arrangements: Spouse/significant other Available Help at Discharge: Available 24 hours/day Type of Home: House Home Access: Stairs to enter   [employer] of Steps: 1   Home Layout: One level Home Equipment: Conservation officer, nature (2 wheels)      Prior Function Prior Level of Function : Independent/Modified Independent;History of Falls (last six months)             Mobility Comments: drives,       Hand Dominance   Dominant Hand: Left    Extremity/Trunk Assessment   Upper Extremity Assessment Upper Extremity Assessment: Defer to OT evaluation RUE Deficits / Details: Able to raise arm about 50 degrees off bed. Very guarded with movement. Grip strength ~4/5  Chronic jerking type of tremor that pt states his father also had. RUE: Unable to fully assess due to pain RUE Coordination: decreased fine motor LUE Deficits / Details: Able to raise arm about 50 degrees off bed. Very guarded with movement. Grip strength ~4/5. Chronic jerking type of tremor that pt states his father also had. LUE: Unable to fully assess due to pain LUE Coordination: decreased fine motor    Lower Extremity Assessment Lower Extremity Assessment:  (limited WB tolerated for stnading, knees flexed)    Cervical / Trunk Assessment Cervical / Trunk Assessment: Kyphotic;Other exceptions Cervical / Trunk Exceptions: TLSO Black brace.  Communication   Communication: Other (comment)  Cognition Arousal/Alertness: Awake/alert Behavior During Therapy: WFL for tasks assessed/performed, Anxious, Agitated Overall Cognitive Status: Impaired/Different from baseline Area of Impairment: Orientation                 Orientation Level: Time                      General Comments      Exercises     Assessment/Plan    PT Assessment  Patient needs continued PT services  PT Problem List Decreased strength;Decreased knowledge of precautions;Decreased cognition;Decreased mobility;Decreased knowledge of use of DME;Decreased activity tolerance;Decreased safety awareness;Pain       PT Treatment Interventions DME instruction;Therapeutic activities;Gait training;Therapeutic exercise;Patient/family education;Cognitive remediation;Functional mobility training    PT Goals (Current goals can be found in the Care Plan section)  Acute Rehab PT Goals Patient Stated Goal: agreed to mobilize PT Goal Formulation: With patient/family Time For Goal Achievement: 10/23/21 Potential to Achieve Goals: Fair    Frequency Min 2X/week     Co-evaluation PT/OT/SLP Co-Evaluation/Treatment: Yes Reason for Co-Treatment: Complexity of the patient's impairments (multi-system involvement);For patient/therapist safety;To address functional/ADL transfers PT goals addressed during session: Mobility/safety with mobility OT goals addressed during session: ADL's and self-care       AM-PAC PT "6 Clicks" Mobility  Outcome Measure Help needed turning from your back to your side while in a flat bed without using bedrails?: Total Help needed moving from lying on your back to sitting on the side of a flat bed without using bedrails?: Total Help needed moving to and from a bed  to a chair (including a wheelchair)?: Total Help needed standing up from a chair using your arms (e.g., wheelchair or bedside chair)?: Total Help needed to walk in hospital room?: Total Help needed climbing 3-5 steps with a railing? : Total 6 Click Score: 6    End of Session Equipment Utilized During Treatment: Gait belt Activity Tolerance: Patient limited by pain Patient left: in bed;with call bell/phone within reach;with bed alarm set;with family/visitor present Nurse Communication: Mobility status PT Visit Diagnosis: Unsteadiness on feet (R26.81);Repeated falls (R29.6);Muscle  weakness (generalized) (M62.81);Difficulty in walking, not elsewhere classified (R26.2);Pain    Time: 8118-8677 PT Time Calculation (min) (ACUTE ONLY): 38 min   Charges:   PT Evaluation $PT Eval Moderate Complexity: [place] Pager [phone] Office [phone]   Claretha Cooper 10/09/2021, 12:19 PM

## 2021-10-09 NOTE — NC FL2 (Signed)
[place] LEVEL OF CARE SCREENING TOOL     IDENTIFICATION  Patient Name: [name] Birthdate: March 25, 1941 Sex: male Admission Date (Current Location): 10/08/2021  [facility] and [place] Number:  Herbalist and Address:  [facility],  [address], [place]      Provider Number: [id number]  Attending Physician Name and Address:  Bonnielee Haff, MD  Relative Name and Phone Number:  Mancil Pfenning [phone]    Current Level of Care: Hospital Recommended Level of Care: [place] Prior Approval Number:    Date Approved/Denied:   PASRR Number: [id number]  Discharge Plan: SNF    Current Diagnoses: Patient Active Problem List   Diagnosis Date Noted   Fall at home, initial encounter 10/08/2021   Closed fracture of lumbar spine without lesion of spinal cord ([place]) 10/08/2021   GERD (gastroesophageal reflux disease) 10/08/2021   Elevated LFTs 10/08/2021   Rhabdomyolysis 10/08/2021   AKI (acute kidney injury) ([place]) 11/29/2018   Urinary tract obstruction by kidney stone 11/29/2018   Type 2 diabetes mellitus with hyperlipidemia ([place]) 11/29/2018   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   Post PTCA 10/22/2016   Coronary artery disease of native artery of native heart with stable angina pectoris ([place]) 10/20/2016   NSTEMI (non-ST elevated myocardial infarction) ([place]) 09/25/2016   MGUS (monoclonal gammopathy of unknown significance)    Primary hypertension    CKD (chronic kidney disease) stage 4, GFR 15-29 ml/min (HCC)    HLD (hyperlipidemia)    Borderline diabetes    Seborrheic keratosis     Orientation RESPIRATION BLADDER Height & Weight     Self, Time, Situation, Place  Normal Continent, External catheter Weight: 184 lb 4.9 oz (83.6 kg) Height:  6' (182.9 cm)  BEHAVIORAL SYMPTOMS/MOOD NEUROLOGICAL BOWEL NUTRITION STATUS      Continent    AMBULATORY STATUS COMMUNICATION OF NEEDS Skin   Extensive Assist Verbally  Normal                       Personal Care Assistance Level of Assistance  Bathing, Dressing Bathing Assistance: Limited assistance   Dressing Assistance: Limited assistance     Functional Limitations Info             SPECIAL CARE FACTORS FREQUENCY  PT (By licensed PT), OT (By licensed OT)     PT Frequency: 5x/wk OT Frequency: 5x/wk            Contractures Contractures Info: Not present    Additional Factors Info  Code Status, Allergies, Insulin Sliding Scale Code Status Info: Full Allergies Info: Sulfa Antibiotics, Morphine And Related, Lisinopril   Insulin Sliding Scale Info: see MAR       Current Medications (10/09/2021):  This is the current hospital active medication list Current Facility-Administered Medications  Medication Dose Route Frequency Provider Last Rate Last Admin   0.45 % sodium chloride infusion   Intravenous Continuous Bonnielee Haff, MD 75 mL/hr at 10/09/21 1231 New Bag at 10/09/21 1231   amLODipine (NORVASC) tablet 2.5 mg  2.5 mg Oral Daily Bonnielee Haff, MD   2.5 mg at 10/09/21 1211   aspirin EC tablet 81 mg  81 mg Oral Daily Bonnielee Haff, MD   81 mg at 10/09/21 1211   dorzolamide-timolol (COSOPT) 22.3-6.8 MG/ML ophthalmic solution 1 drop  1 drop Both Eyes BID Bonnielee Haff, MD   1 drop at 10/09/21 1216   hydrALAZINE (APRESOLINE) injection 10 mg  10 mg Intravenous  Q8H PRN Cherylann Ratel A, DO       HYDROmorphone (DILAUDID) injection 0.5 mg  0.5 mg Intravenous Q4H PRN Bonnielee Haff, MD       insulin aspart (novoLOG) injection 0-15 Units  0-15 Units Subcutaneous TID WC Kyle, Tyrone A, DO   2 Units at 10/09/21 1218   isosorbide mononitrate (IMDUR) 24 hr tablet 60 mg  60 mg Oral Daily Bonnielee Haff, MD   60 mg at 10/09/21 1210   latanoprost (XALATAN) 0.005 % ophthalmic solution 1 drop  1 drop Both Eyes QHS Bonnielee Haff, MD       lip balm (CARMEX) ointment 1 application.  1 application. Topical PRN Bonnielee Haff, MD        nebivolol (BYSTOLIC) tablet 10 mg  10 mg Oral Daily Bonnielee Haff, MD   10 mg at 10/09/21 1211   oxyCODONE-acetaminophen (PERCOCET/ROXICET) 5-325 MG per tablet 1-2 tablet  1-2 tablet Oral Q4H PRN Bonnielee Haff, MD   2 tablet at 10/09/21 1234   pantoprazole (PROTONIX) EC tablet 40 mg  40 mg Oral Daily Bonnielee Haff, MD   40 mg at 10/09/21 1211   prochlorperazine (COMPAZINE) injection 10 mg  10 mg Intravenous Q6H PRN Marylyn Ishihara, Tyrone A, DO       rosuvastatin (CRESTOR) tablet 20 mg  20 mg Oral QHS Bonnielee Haff, MD         Discharge Medications: Please see discharge summary for a list of discharge medications.  Relevant Imaging Results:  Relevant Lab Results:   Additional Information SS# [id number]; COVID vaccine x 3  [facility], LCSW

## 2021-10-09 NOTE — Progress Notes (Signed)
[facility] PROGRESS NOTE   [name] [name] DOB: 14-Aug-1940 DOA: 10/08/2021  PCP: Burnard Bunting, MD  Brief History/Interval Summary: 81 y.o. male with medical history significant of MGUS, CKD4 s/p right nephrectomy, HTN, HLD, CAD.  Patient presented after sustaining a fall at home.  Could not get up from the floor for several hours.  Was brought by EMS.  Lives with his wife.  Found to have rhabdomyolysis.  Also found to have fracture involving the L2 vertebrae.  Patient was hospitalized for further management.     Consultants: EDP discussed with neurosurgery on-call at the time of admission  Procedures: None yet    Subjective/Interval History: Patient complains of pain in the lower back which is better today compared to yesterday.  Seems to be mildly distracted.  Denies any chest pain.  No nausea vomiting.    Assessment/Plan:  Fracture involving L2 vertebrae EDP discussed this finding with neurosurgery who recommended TLSO brace.  Outpatient follow-up. Dr. Marcello Moores with neurosurgery was on call at that time. Pain appears to be reasonably well controlled.  PT and OT evaluation.  Rhabdomyolysis This is as a result of fall and laying on the floor for several hours. CK was elevated at 3434.  Improved this morning.  Continue IV fluids.  Acute kidney injury on chronic kidney disease stage IV Presented with creatinine of 3.15.  Improved to 3.0 today.  Continue IV fluids. Baseline creatinine is not entirely clear.  There is a value of 4.2 from April 2023. Monitor urine output.  Avoid nephrotoxic agents.  Essential hypertension Continue to monitor blood pressures.  Noted to be on amlodipine, Bystolic, Imdur prior to admission.  These can be resumed.  MGUS Followed by medical oncology at [facility].  Does not appear to be on any active treatment currently.  History of coronary artery disease/hyperlipidemia Stable.  Liver mass/abnormal  LFTs Abdominal ultrasound shows an enlarging mass in the right lobe of liver measuring 12 cm.  Either a primary or metastatic process.  This was identified last month as well on the renal ultrasound.  It appears that the oncologist at [facility] is aware of this but patient mentioned that no work-up has been initiated.  Will check AFP.  Will order MRI of the liver. Noted to have elevated AST ALT.  Bilirubin is normal.  Alkaline phosphatase is also elevated. Hepatitis panel is pending.  Diabetes mellitus type 2, with renal complications, chronic kidney disease stage IV Monitor CBGs.  HbA1c 8.8.  Continue SSI.  Apparently on Tresiba prior to admission.  Normocytic anemia No evidence of overt blood loss.  Check anemia panel.  Likely related to his MGUS.  Leukocytosis Noted to be better this morning.  No obvious source of infection identified.   DVT Prophylaxis: Subcutaneous heparin Code Status: Full code Family Communication: No family at bedside.  Will update wife Disposition Plan: To be determined.  Status is: Inpatient Remains inpatient appropriate because: Rhabdomyolysis, back pain due to vertebral spine injury      Medications: Scheduled:  insulin aspart  0-15 Units Subcutaneous TID WC   Continuous:  sodium bicarbonate in 0.45 NS mL infusion 100 mL/hr at 10/09/21 0653   BUL:AGTXMIWOEHO, HYDROmorphone (DILAUDID) injection, prochlorperazine  Antibiotics: Anti-infectives (From admission, onward)    None       Objective:  Vital Signs  Vitals:   10/08/21 1547 10/08/21 2010 10/08/21 2356 10/09/21 0421  BP: (!) 152/77 (!) 146/77 (!) 145/74 133/72  Pulse: 83 84 90 88  Resp: '20 16 16 14  '$ Temp: 98 F (36.7 C) 97.7 F (36.5 C) 97.6 F (36.4 C) 100.1 F (37.8 C)  TempSrc: Oral Oral Oral Oral  SpO2: 94% 96% 96% 90%  Weight:      Height:        Intake/Output Summary (Last 24 hours) at 10/09/2021 0943 Last data filed at 10/09/2021 0850 Gross per 24 hour  Intake 1453.96  ml  Output 775 ml  Net 678.96 ml   Filed Weights   10/08/21 1036 10/08/21 1542  Weight: 81.6 kg 83.6 kg    General appearance: Awake alert.  In no distress.  Mildly distracted Resp: Diminished air entry at the bases.  No wheezing or rhonchi. Cardio: S1-S2 is normal regular.  No S3-S4.  No rubs murmurs or bruit GI: Abdomen is soft.  Nontender nondistended.  Bowel sounds are present normal.  No masses organomegaly Extremities: No edema.  Physical deconditioning is noted.  He is able to move the legs but noted to be weak bilaterally. Neurologic:   No focal neurological deficits.    Lab Results:  Data Reviewed: I have personally reviewed following labs and reports of the imaging studies  CBC: Recent Labs  Lab 10/08/21 1115 10/09/21 0611  WBC 15.6* 12.5*  NEUTROABS 13.2*  --   HGB 11.2* 10.3*  HCT 34.3* 32.9*  MCV 86.4 87.7  PLT 127* 113*    Basic Metabolic Panel: Recent Labs  Lab 10/08/21 1115 10/09/21 0611  NA 140 141  K 4.8 4.6  CL 110 111  CO2 19* 22  GLUCOSE 260* 119*  BUN 48* 49*  CREATININE 3.15* 3.00*  CALCIUM 8.5* 8.1*    GFR: Estimated Creatinine Clearance: 20.8 mL/min (A) (by C-G formula based on SCr of 3 mg/dL (H)).  Liver Function Tests: Recent Labs  Lab 10/08/21 1115 10/09/21 0611  AST 165* 118*  ALT 84* 69*  ALKPHOS 342* 299*  BILITOT 1.2 1.1  PROT 6.5 5.9*  ALBUMIN 2.8* 2.5*     Cardiac Enzymes: Recent Labs  Lab 10/08/21 1115 10/09/21 0611  CKTOTAL 3,434* 1,267*     HbA1C: Recent Labs    10/08/21 1500  HGBA1C 8.8*    CBG: Recent Labs  Lab 10/08/21 1026 10/08/21 1722 10/08/21 2013 10/09/21 0727  GLUCAP 234* 210* 201* 101*     Recent Results (from the past 240 hour(s))  Resp Panel by RT-PCR (Flu A&B, Covid) Anterior Nasal Swab     Status: None   Collection Time: 10/08/21 11:09 AM   Specimen: Anterior Nasal Swab  Result Value Ref Range Status   SARS Coronavirus 2 by RT PCR NEGATIVE NEGATIVE Final    Comment:  (NOTE) SARS-CoV-2 target nucleic acids are NOT DETECTED.  The SARS-CoV-2 RNA is generally detectable in upper respiratory specimens during the acute phase of infection. The lowest concentration of SARS-CoV-2 viral copies this assay can detect is 138 copies/mL. A negative result does not preclude SARS-Cov-2 infection and should not be used as the sole basis for treatment or other patient management decisions. A negative result may occur with  improper specimen collection/handling, submission of specimen other than nasopharyngeal swab, presence of viral mutation(s) within the areas targeted by this assay, and inadequate number of viral copies(<138 copies/mL). A negative result must be combined with clinical observations, patient history, and epidemiological information. The expected result is Negative.  Fact Sheet for Patients:  EntrepreneurPulse.com.au  Fact Sheet for Healthcare Providers:  IncredibleEmployment.be  This test is no t yet approved or  cleared by the [place] and  has been authorized for detection and/or diagnosis of SARS-CoV-2 by FDA under an Emergency Use Authorization (EUA). This EUA will remain  in effect (meaning this test can be used) for the duration of the COVID-19 declaration under Section 564(b)(1) of the Act, 21 U.S.C.section 360bbb-3(b)(1), unless the authorization is terminated  or revoked sooner.       Influenza A by PCR NEGATIVE NEGATIVE Final   Influenza B by PCR NEGATIVE NEGATIVE Final    Comment: (NOTE) The Xpert Xpress SARS-CoV-2/FLU/RSV plus assay is intended as an aid in the diagnosis of influenza from Nasopharyngeal swab specimens and should not be used as a sole basis for treatment. Nasal washings and aspirates are unacceptable for Xpert Xpress SARS-CoV-2/FLU/RSV testing.  Fact Sheet for Patients: EntrepreneurPulse.com.au  Fact Sheet for Healthcare  Providers: IncredibleEmployment.be  This test is not yet approved or cleared by the [place] FDA and has been authorized for detection and/or diagnosis of SARS-CoV-2 by FDA under an Emergency Use Authorization (EUA). This EUA will remain in effect (meaning this test can be used) for the duration of the COVID-19 declaration under Section 564(b)(1) of the Act, 21 U.S.C. section 360bbb-3(b)(1), unless the authorization is terminated or revoked.  Performed at [facility], [place] [address], [place], [place] [address]       Radiology Studies: DG Pelvis 1-2 Views  Result Date: 10/08/2021 CLINICAL DATA:  Status post fall EXAM: PELVIS - 1-2 VIEW COMPARISON:  Renal stone protocol CT 10/30/2018 FINDINGS: Mild degenerative changes of the hips. Radiation seeds noted in the region the prostate. Soft tissues otherwise unremarkable. IMPRESSION: No acute fracture or dislocation of the pelvis. Electronically Signed   By: Miachel Roux M.D.   On: 10/08/2021 11:57   DG Wrist Complete Right  Result Date: 10/08/2021 CLINICAL DATA:  Status post fall EXAM: RIGHT WRIST - COMPLETE 3+ VIEW COMPARISON:  None available FINDINGS: Extensive chondrocalcinosis of the triangular fibrocartilage. No fracture or dislocation. Moderate degenerative changes of the interphalangeal joint of the thumb. Mild degenerative changes at the first carpometacarpal joint. IMPRESSION: No acute abnormality of the right wrist. Electronically Signed   By: Miachel Roux M.D.   On: 10/08/2021 11:58   CT Head Wo Contrast  Result Date: 10/08/2021 CLINICAL DATA:  [place] in bathroom this morning. Low back pain. Patient reportedly did not hit his head. EXAM: CT HEAD WITHOUT CONTRAST CT CERVICAL SPINE WITHOUT CONTRAST TECHNIQUE: Multidetector CT imaging of the head and cervical spine was performed following the standard protocol without intravenous contrast. Multiplanar CT image reconstructions of the cervical  spine were also generated. RADIATION DOSE REDUCTION: This exam was performed according to the departmental dose-optimization program which includes automated exposure control, adjustment of the mA and/or kV according to patient size and/or use of iterative reconstruction technique. COMPARISON:  08/13/2021. FINDINGS: CT HEAD FINDINGS Brain: No evidence of acute infarction, hemorrhage, hydrocephalus, extra-axial collection or mass lesion/mass effect. Vascular: No hyperdense vessel or unexpected calcification. Skull: Normal. Negative for fracture or focal lesion. Sinuses/Orbits: Globes and orbits are unremarkable. Sinuses are clear. Other: None. CT CERVICAL SPINE FINDINGS Alignment: Normal. Skull base and vertebrae: No acute fracture. No primary bone lesion or focal pathologic process. Soft tissues and spinal canal: No prevertebral fluid or swelling. No visible canal hematoma. Disc levels: Minor loss of disc height at C5-C6. Moderate loss of disc height at C6-C7. Endplate spurring and mild disc bulging noted at C6-C7. Moderate left neural foraminal narrowing. No convincing disc  herniation. Upper chest: No acute or significant abnormality. Other: None. IMPRESSION: HEAD CT 1. No acute intracranial abnormalities. CERVICAL CT 1. No fracture or acute finding. Electronically Signed   By: Lajean Manes M.D.   On: 10/08/2021 11:50   CT Cervical Spine Wo Contrast  Result Date: 10/08/2021 CLINICAL DATA:  [place] in bathroom this morning. Low back pain. Patient reportedly did not hit his head. EXAM: CT HEAD WITHOUT CONTRAST CT CERVICAL SPINE WITHOUT CONTRAST TECHNIQUE: Multidetector CT imaging of the head and cervical spine was performed following the standard protocol without intravenous contrast. Multiplanar CT image reconstructions of the cervical spine were also generated. RADIATION DOSE REDUCTION: This exam was performed according to the departmental dose-optimization program which includes automated exposure control,  adjustment of the mA and/or kV according to patient size and/or use of iterative reconstruction technique. COMPARISON:  08/13/2021. FINDINGS: CT HEAD FINDINGS Brain: No evidence of acute infarction, hemorrhage, hydrocephalus, extra-axial collection or mass lesion/mass effect. Vascular: No hyperdense vessel or unexpected calcification. Skull: Normal. Negative for fracture or focal lesion. Sinuses/Orbits: Globes and orbits are unremarkable. Sinuses are clear. Other: None. CT CERVICAL SPINE FINDINGS Alignment: Normal. Skull base and vertebrae: No acute fracture. No primary bone lesion or focal pathologic process. Soft tissues and spinal canal: No prevertebral fluid or swelling. No visible canal hematoma. Disc levels: Minor loss of disc height at C5-C6. Moderate loss of disc height at C6-C7. Endplate spurring and mild disc bulging noted at C6-C7. Moderate left neural foraminal narrowing. No convincing disc herniation. Upper chest: No acute or significant abnormality. Other: None. IMPRESSION: HEAD CT 1. No acute intracranial abnormalities. CERVICAL CT 1. No fracture or acute finding. Electronically Signed   By: Lajean Manes M.D.   On: 10/08/2021 11:50   CT Thoracic Spine Wo Contrast  Result Date: 10/08/2021 CLINICAL DATA:  Fall in bathroom with low back pain EXAM: CT THORACIC AND LUMBAR SPINE WITHOUT CONTRAST TECHNIQUE: Multidetector CT imaging of the thoracic and lumbar spine was performed without contrast. Multiplanar CT image reconstructions were also generated. RADIATION DOSE REDUCTION: This exam was performed according to the departmental dose-optimization program which includes automated exposure control, adjustment of the mA and/or kV according to patient size and/or use of iterative reconstruction technique. COMPARISON:  None Available. FINDINGS: CT THORACIC SPINE FINDINGS Alignment: Exaggerated thoracic kyphosis.  No traumatic malalignment Vertebrae: Bridging osteophytes span T2 to the lumbar spine. No  superimposed fracture is seen. Generalized osteopenia. Paraspinal and other soft tissues: No paraspinal hematoma or masslike finding. Disc levels: No visible herniation or impingement CT LUMBAR SPINE FINDINGS Segmentation: 5 lumbar type vertebrae Alignment: Normal Vertebrae: Rigid spine from T2-L2 with fracture through the anterior superior corner of L2 also traversing an anterior intervertebral osteophyte at L1-2. No displacement or visible posterior element involvement at this level. Additional fractures through anterior superior L4 endplate osteophyte. Paraspinal and other soft tissues: Mild paravertebral at edema at the level of fractures. Disc levels: Generalized disc bulging without focal high-grade stenosis is seen. IMPRESSION: 1. L2 anterior superior corner fracture also involving a L1-2 bridging osteophyte. No listhesis or visible posterior element involvement. 2. L4 anterior superior osteophyte fracture. 3. Rigid spine from bridging osteophytes at T2-L2. Electronically Signed   By: Jorje Guild M.D.   On: 10/08/2021 11:52   CT Lumbar Spine Wo Contrast  Result Date: 10/08/2021 CLINICAL DATA:  Fall in bathroom with low back pain EXAM: CT THORACIC AND LUMBAR SPINE WITHOUT CONTRAST TECHNIQUE: Multidetector CT imaging of the thoracic and lumbar spine was performed  without contrast. Multiplanar CT image reconstructions were also generated. RADIATION DOSE REDUCTION: This exam was performed according to the departmental dose-optimization program which includes automated exposure control, adjustment of the mA and/or kV according to patient size and/or use of iterative reconstruction technique. COMPARISON:  None Available. FINDINGS: CT THORACIC SPINE FINDINGS Alignment: Exaggerated thoracic kyphosis.  No traumatic malalignment Vertebrae: Bridging osteophytes span T2 to the lumbar spine. No superimposed fracture is seen. Generalized osteopenia. Paraspinal and other soft tissues: No paraspinal hematoma or  masslike finding. Disc levels: No visible herniation or impingement CT LUMBAR SPINE FINDINGS Segmentation: 5 lumbar type vertebrae Alignment: Normal Vertebrae: Rigid spine from T2-L2 with fracture through the anterior superior corner of L2 also traversing an anterior intervertebral osteophyte at L1-2. No displacement or visible posterior element involvement at this level. Additional fractures through anterior superior L4 endplate osteophyte. Paraspinal and other soft tissues: Mild paravertebral at edema at the level of fractures. Disc levels: Generalized disc bulging without focal high-grade stenosis is seen. IMPRESSION: 1. L2 anterior superior corner fracture also involving a L1-2 bridging osteophyte. No listhesis or visible posterior element involvement. 2. L4 anterior superior osteophyte fracture. 3. Rigid spine from bridging osteophytes at T2-L2. Electronically Signed   By: Jorje Guild M.D.   On: 10/08/2021 11:52   US Abdomen Complete  Result Date: 10/08/2021 CLINICAL DATA:  Patient with previous history of melanoma, and right nephrectomy for carcinoma. Elevated LFTs with AKI. Nearly 10 cm liver mass noted on renal ultrasound 09/06/2021. EXAM: ABDOMEN ULTRASOUND COMPLETE COMPARISON:  Renal ultrasound 09/06/2021 FINDINGS: Gallbladder: No gallstones or wall thickening visualized. No sonographic Murphy sign noted by sonographer. Common bile duct: Diameter: Nonvisualized. Liver: There is heterogeneous solid mass in the right lobe, today measuring 11.6 x 11.1 x 9.2 cm, previously 9.9 x 9.0 x 9.3 cm. Otherwise within normal limits in parenchymal echogenicity. Portal vein is patent on color Doppler imaging with normal direction of blood flow towards the liver. IVC: No abnormality visualized. Pancreas: Not seen, obscured by bowel gas. Spleen: Size and appearance within normal limits. Length measurement is 10.8 cm. Right Kidney: Length: Surgically absent. Left Kidney: Length: 10.4 cm. Echogenicity within normal  limits. No mass or hydronephrosis visualized. Abdominal aorta: No aneurysm visualized. Other findings: No free fluid is seen. Technically difficult due to: Patient inability to assist with positioning or take deep breaths, inability to hold his breath, and also suboptimal image quality due to multifocal bowel gas shadowing and patient body habitus. IMPRESSION: 1. Surgically absent right kidney with sonographically unremarkable left kidney, as far as seen. Image quality limited. 2. Enlarging mass in the right lobe of the liver, now nearly 12 cm in size most likely representing a primary or metastatic neoplasm. 3. Nonvisualization of the extrahepatic bile ducts and pancreas. Electronically Signed   By: Telford Nab M.D.   On: 10/08/2021 21:15   DG Chest Portable 1 View  Result Date: 10/08/2021 CLINICAL DATA:  Status post fall EXAM: PORTABLE CHEST - 1 VIEW COMPARISON:  07/26/2016 FINDINGS: Apparent cardiomegaly most likely related to extra tori phase of imaging and portable technique. No pulmonary vascular congestion.  Lungs clear.  No pneumothorax. IMPRESSION: No acute cardiopulmonary process. Electronically Signed   By: Miachel Roux M.D.   On: 10/08/2021 11:55       LOS: 1 day   [facility] Pager on [email or web address]  10/09/2021, 9:43 AM

## 2021-10-10 DIAGNOSIS — N179 Acute kidney failure, unspecified: Secondary | ICD-10-CM

## 2021-10-10 DIAGNOSIS — N184 Chronic kidney disease, stage 4 (severe): Secondary | ICD-10-CM | POA: Diagnosis not present

## 2021-10-10 DIAGNOSIS — E785 Hyperlipidemia, unspecified: Secondary | ICD-10-CM

## 2021-10-10 DIAGNOSIS — E1169 Type 2 diabetes mellitus with other specified complication: Secondary | ICD-10-CM

## 2021-10-10 DIAGNOSIS — D472 Monoclonal gammopathy: Secondary | ICD-10-CM

## 2021-10-10 DIAGNOSIS — T796XXA Traumatic ischemia of muscle, initial encounter: Secondary | ICD-10-CM

## 2021-10-10 DIAGNOSIS — W19XXXA Unspecified fall, initial encounter: Secondary | ICD-10-CM | POA: Diagnosis not present

## 2021-10-10 LAB — GLUCOSE, CAPILLARY
Glucose-Capillary: 80 mg/dL (ref 70–99)
Glucose-Capillary: 143 mg/dL — ABNORMAL HIGH (ref 70–99)
Glucose-Capillary: 164 mg/dL — ABNORMAL HIGH (ref 70–99)
Glucose-Capillary: 151 mg/dL — ABNORMAL HIGH (ref 70–99)

## 2021-10-10 LAB — COMPREHENSIVE METABOLIC PANEL
ALT: 82 U/L — ABNORMAL HIGH (ref 0–44)
AST: 142 U/L — ABNORMAL HIGH (ref 15–41)
Albumin: 2.3 g/dL — ABNORMAL LOW (ref 3.5–5.0)
Alkaline Phosphatase: 290 U/L — ABNORMAL HIGH (ref 38–126)
Anion gap: 8 (ref 5–15)
BUN: 63 mg/dL — ABNORMAL HIGH (ref 8–23)
CO2: 22 mmol/L (ref 22–32)
Calcium: 8.3 mg/dL — ABNORMAL LOW (ref 8.9–10.3)
Chloride: 112 mmol/L — ABNORMAL HIGH (ref 98–111)
Creatinine, Ser: 3.73 mg/dL — ABNORMAL HIGH (ref 0.61–1.24)
GFR, Estimated: 15 mL/min — ABNORMAL LOW (ref >60)
Glucose, Bld: 81 mg/dL (ref 70–99)
Potassium: 4.7 mmol/L (ref 3.5–5.1)
Sodium: 142 mmol/L (ref 135–145)
Total Bilirubin: 1.3 mg/dL — ABNORMAL HIGH (ref 0.3–1.2)
Total Protein: 5.8 g/dL — ABNORMAL LOW (ref 6.5–8.1)

## 2021-10-10 LAB — AFP TUMOR MARKER: AFP, Serum, Tumor Marker: 5.7 ng/mL (ref 0.0–6.4)

## 2021-10-10 LAB — CBC
HCT: 31.7 % — ABNORMAL LOW (ref 39.0–52.0)
Hemoglobin: 9.8 g/dL — ABNORMAL LOW (ref 13.0–17.0)
MCH: 27.4 pg (ref 26.0–34.0)
MCHC: 30.9 g/dL (ref 30.0–36.0)
MCV: 88.5 fL (ref 80.0–100.0)
Platelets: 122 10*3/uL — ABNORMAL LOW (ref 150–400)
RBC: 3.58 MIL/uL — ABNORMAL LOW (ref 4.22–5.81)
RDW: 15.8 % — ABNORMAL HIGH (ref 11.5–15.5)
WBC: 13.1 10*3/uL — ABNORMAL HIGH (ref 4.0–10.5)
nRBC: 0 % (ref 0.0–0.2)

## 2021-10-10 LAB — HEPATITIS PANEL, ACUTE
HCV Ab: NONREACTIVE
Hep A IgM: NONREACTIVE
Hep B C IgM: NONREACTIVE
Hepatitis B Surface Ag: NONREACTIVE

## 2021-10-10 LAB — RETICULOCYTES
Immature Retic Fract: 12.4 % (ref 2.3–15.9)
RBC.: 3.56 MIL/uL — ABNORMAL LOW (ref 4.22–5.81)
Retic Count, Absolute: 29.2 10*3/uL (ref 19.0–186.0)
Retic Ct Pct: 0.8 % (ref 0.4–3.1)

## 2021-10-10 LAB — IRON AND TIBC
Iron: 14 ug/dL — ABNORMAL LOW (ref 45–182)
Saturation Ratios: 7 % — ABNORMAL LOW (ref 17.9–39.5)
TIBC: 198 ug/dL — ABNORMAL LOW (ref 250–450)
UIBC: 184 ug/dL

## 2021-10-10 LAB — FOLATE: Folate: 8.9 ng/mL (ref >5.9)

## 2021-10-10 LAB — CK: Total CK: 592 U/L — ABNORMAL HIGH (ref 49–397)

## 2021-10-10 LAB — FERRITIN: Ferritin: 364 ng/mL — ABNORMAL HIGH (ref 24–336)

## 2021-10-10 LAB — VITAMIN B12: Vitamin B-12: >7500 pg/mL — ABNORMAL HIGH (ref 180–914)

## 2021-10-10 LAB — AMMONIA: Ammonia: 13 umol/L (ref 9–35)

## 2021-10-10 MED ORDER — POLYETHYLENE GLYCOL 3350 17 G PO PACK
17.0000 g | PACK | Freq: Two times a day (BID) | ORAL | Status: AC
  Administered 2021-10-10 – 2021-10-11 (×3): 17 g via ORAL
  Filled 2021-10-10 (×3): qty 1

## 2021-10-10 MED ORDER — SODIUM CHLORIDE 0.45 % IV SOLN: INTRAVENOUS | Status: AC

## 2021-10-10 MED ORDER — HYDROMORPHONE HCL 1 MG/ML IJ SOLN
0.5000 mg | Freq: Four times a day (QID) | INTRAMUSCULAR | Status: DC | PRN
  Administered 2021-10-11 – 2021-10-12 (×2): 0.5 mg via INTRAVENOUS
  Filled 2021-10-10 (×2): qty 0.5

## 2021-10-10 MED ORDER — MUPIROCIN CALCIUM 2 % EX CREA
TOPICAL_CREAM | Freq: Two times a day (BID) | CUTANEOUS | Status: DC
  Administered 2021-10-13: 1 via TOPICAL
  Filled 2021-10-10 (×2): qty 15

## 2021-10-10 MED ORDER — HEPARIN SODIUM (PORCINE) 5000 UNIT/ML IJ SOLN
5000.0000 [IU] | Freq: Three times a day (TID) | INTRAMUSCULAR | Status: DC
  Administered 2021-10-10 – 2021-10-15 (×15): 5000 [IU] via SUBCUTANEOUS
  Filled 2021-10-10 (×16): qty 1

## 2021-10-10 NOTE — Consult Note (Addendum)
[facility] Nurse Consult Note: Reason for Consult: Consult requested for posterior head.  Pt was found down for an unknown period of home after a fall and lay against a hard floor, according to family members.  Wound type: Posterior head with Stage 2 pressure injury; 5X5X.1cm, red and moist with small amt yellow drainage and strong odor.  No fluctuance or deeper wound when probed with a swab.  CT scan did not indicate a fluid collection or hematoma to the site. Suspect this was a previous Deep tissue pressure injury which has evolved into partial thickness skin loss. The location is difficult to keep a dressing on related to hair over the affected area.  Discussed plan of care with family members at the bedside.  Pressure Injury POA: Yes Dressing procedure/placement/frequency: Topical treatment orders provided for bedside nurses to perform as follows: Wash hair daily, then apply Bactroban to posterior head wound BID; and  leave open to air Put foam head cradle behind head when if bed (if they are available from the OR). The Unit secretary is going to call and attempt to obtain one if possible.   Please re-consult if further assistance is needed.  Thank-you,  Julien Girt MSN, [place], [place], [place], [place]

## 2021-10-10 NOTE — TOC Progression Note (Signed)
Transition of Care [facility]) - Progression Note    Patient Details  Name: [name] MRN: [id number] Date of Birth: 1941/01/11  Transition of [facility]) CM/SW Contact  Lennart Pall, LCSW Phone Number: 10/10/2021, 1:33 PM  Clinical Narrative:    Have reviewed SNF bed offers with pt and spouse and they have chosen bed at [facility].  MD anticipates pt will be medically ready for dc tomorrow.  Have gotten insurance authorization [phone]).     Expected Discharge Plan: [place] Barriers to Discharge: Continued Medical Work up, SNF Pending bed offer, Ship broker  Expected Discharge Plan and Services Expected Discharge Plan: [place] In-house Referral: Clinical Social Work   Post Acute Care Choice: [place] Living arrangements for the past 2 months: Single Family Home                 DME Arranged: N/A DME Agency: NA                   Social Determinants of Health (SDOH) Interventions    Readmission Risk Interventions    10/09/2021    2:37 PM  Readmission Risk Prevention Plan  Transportation Screening Complete  PCP or Specialist Appt within 5-7 Days Complete  Home Care Screening Complete  Medication Review (RN CM) Complete

## 2021-10-10 NOTE — Progress Notes (Signed)
Occupational Therapy Treatment Patient Details Name: [name] MRN: [id number] DOB: 1940-07-05 Today's Date: 10/10/2021   History of present illness 81 y.o. male with medical history significant of MGUS, CKD4 s/p right nephrectomy, HTN, HLD, CAD.  Patient presented after sustaining a fall at home.  Could not get up from the floor for several hours.  Was brought by EMS.  Found to have rhabdomyolysis. CT reveals liver Mass.   Also found to have fracture involving the L2 vertebrae.  TLSO brace to room.   OT comments  Patient participated in self feeding with hand over hand assistance. Patients wife was educated on how to allow patient to engage in self feeding tasks. Patients wife verbalized understanding. Patients nurse was educated on positioning in chair with placement of pillows to support lumbar back. Patients nurse was also educated on proper positioning of TLSO brace with max A to adjust sitting in chair. Patients nurse verbalized and demonstrated understanding. Nurse also made aware patients IV was leaking. Nurse was able to address as well. Patient's discharge plan remains appropriate at this time. OT will continue to follow acutely.     Recommendations for follow up therapy are one component of a multi-disciplinary discharge planning process, led by the attending physician.  Recommendations may be updated based on patient status, additional functional criteria and insurance authorization.    Follow Up Recommendations  Skilled nursing-short term rehab (<3 hours/day)    Assistance Recommended at Discharge Frequent or constant Supervision/Assistance  Patient can return home with the following  Two people to help with walking and/or transfers;A lot of help with walking and/or transfers;A lot of help with bathing/dressing/bathroom;Two people to help with bathing/dressing/bathroom;Assistance with cooking/housework;Assist for transportation;Direct supervision/assist for financial  management;Direct supervision/assist for medications management;Help with stairs or ramp for entrance;Assistance with feeding   Equipment Recommendations  Other (comment) (defer to next venue)    Recommendations for Other Services      Precautions / Restrictions Precautions Precautions: Back;Fall Required Braces or Orthoses: Spinal Brace Spinal Brace: Thoracolumbosacral orthotic;Applied in supine position Other Brace: must wear  all the time Restrictions Weight Bearing Restrictions: No       Mobility Bed Mobility                    Transfers                         Balance                                           ADL either performed or assessed with clinical judgement   ADL Overall ADL's : Needs assistance/impaired Eating/Feeding: Maximal assistance;Sitting Eating/Feeding Details (indicate cue type and reason): in recliner with hand over hand with red foam for appe sauce. patient noted to have tremors in LUE wife reported this was normal for him. educated on weights when pt gets stronger. wife verbalized understanding.patient was quick to fatigue. needed cues for initiantion and sequencing.patient and wife were educated on handled cups light weight would be helpful for patient to participate in getting up to mouth. wife reported she would bring one in from home. patient was easily distracted during session.  Extremity/Trunk Assessment              Vision       Perception     Praxis      Cognition Arousal/Alertness: Awake/alert Behavior During Therapy: WFL for tasks assessed/performed, Anxious, Agitated Overall Cognitive Status: Impaired/Different from baseline                                 General Comments: slow to respond, wife is in room and quick to answer for patient        Exercises      Shoulder Instructions       General Comments       Pertinent Vitals/ Pain       Pain Assessment Pain Assessment: Faces Faces Pain Scale: Hurts whole lot Pain Location: Lower back Pain Descriptors / Indicators: Constant Pain Intervention(s): Limited activity within patient's tolerance, Monitored during session, Repositioned  Home Living                                          Prior Functioning/Environment              Frequency  Min 2X/week        Progress Toward Goals  OT Goals(current goals can now be found in the care plan section)  Progress towards OT goals: Progressing toward goals     Plan Discharge plan remains appropriate    Co-evaluation                 AM-PAC OT "6 Clicks" Daily Activity     Outcome Measure   Help from another person eating meals?: A Lot Help from another person taking care of personal grooming?: A Lot Help from another person toileting, which includes using toliet, bedpan, or urinal?: Total Help from another person bathing (including washing, rinsing, drying)?: Total Help from another person to put on and taking off regular upper body clothing?: A Lot Help from another person to put on and taking off regular lower body clothing?: Total 6 Click Score: 9    End of Session Equipment Utilized During Treatment: Other (comment) (red foam)  OT Visit Diagnosis: Unsteadiness on feet (R26.81);History of falling (Z91.81);Muscle weakness (generalized) (M62.81);Pain;Other symptoms and signs involving cognitive function;Feeding difficulties (R63.3)   Activity Tolerance Patient limited by pain;Treatment limited secondary to agitation   Patient Left in bed;with call bell/phone within reach;with bed alarm set;with family/visitor present;with SCD's reapplied   Nurse Communication Mobility status;Patient requests pain meds        Time: 1224-1242 OT Time Calculation (min): 18 min  Charges: OT General Charges $OT Visit: 1 Visit OT Treatments $Self Care/Home  Management : 8-22 mins  [name] OTR/L, MS Acute Rehabilitation Department Office# [phone] Pager# [phone]   [name] 10/10/2021, 12:58 PM

## 2021-10-10 NOTE — Progress Notes (Signed)
[facility] PROGRESS NOTE    Progress Note  [name]  CLE:[id number] DOB: 1941-01-14 DOA: 10/08/2021 PCP: Burnard Bunting, MD     Brief Narrative:   [name] is an 81 y.o. 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, essential hypertension, prostate cancer with a history of  insertion of radiation seeds fell at home could not get up found by EMS in the ED was found to be in rhabdomyolysis, and an L2 anterior superior fracture, abdominal ultrasound showed absent right kidney unremarkable left an enlarging mass in the right lobe of the liver about 12 cm, MRI of the liver done that showed right hepatic lobe which is new with precontrast T1 hyperintense density which favors isolated melanoma    Assessment/Plan:   Fall at home, initial encounter/with fracture involving L2 vertebrae: Neurosurgery was curb sided who recommended DLCO. Follow-up with them as an outpatient. Pain appears to be reasonably controlled. Physical therapy evaluated the patient recommended rehab.  Nontraumatic rhabdomyolysis: Improved  with IV fluid hydration KVO IV fluids.  Acute kidney injury on chronic kidney disease stage IV: Baseline creatinine unclear back in April 2023 it was 4. He has been followed up as an outpatient on this as his creatinine has been slowly deteriorating. Started on IV fluids his creatinine this morning is 3.7  Essential hypertension: Well-controlled they have been resumed.  MGUS: Follow-up by [facility], is currently on no active treatment.  Liver mass/abnormal LFT's: Abdominal ultrasound showed liver mass. MRI was done that showed a new liver mass concerning for malignancy. Alpha-fetoprotein 5.7 Hepatitis panel. His hematologist oncologist knows about his hepatic mass they have a follow-up ointment to discuss if he would like to pursue any  further evaluation and treatment. Talk to the patient that there is probably a malignancy and that he needs to follow-up with his own oncologist as an outpatient.  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 Was on Tarceva prior to admission. Sliding scale insulin blood glucose this morning is well controlled in the 80s.  Normocytic anemia: Likely related to MGUS. Iron 14 saturation 7 ferritin of 364. B12 greater than 7000.  Leukocytosis: Has remained afebrile, Tmax of 100.2 no obvious source of infection. Likely reactive due to fracture and rhabdomyolysis.   DVT prophylaxis: lovenox Family Communication:wife Status is: Inpatient Remains inpatient appropriate because: Pain not controlled has not had a bowel movement now tolerating his diet.    Code Status:     Code Status Orders  (From admission, onward)           Start     Ordered   10/08/21 1541  Full code  Continuous        10/08/21 1540           Code Status History     Date Active Date Inactive Code Status Order ID Comments User Context   11/29/2018 1804 12/02/2018 1435 DNR [id number]  Donne Hazel, MD Inpatient   10/22/2016 1206 10/23/2016 1407 Full Code [id number]  Adrian Prows, MD Inpatient   09/25/2016 1244 09/28/2016 1624 Full Code [id number]  Adrian Prows, MD ED      Advance Directive Documentation    Flowsheet Row Most Recent Value  Type of Advance Directive Healthcare Power of Attorney  Pre-existing out of facility DNR order (yellow form or pink MOST form) --  "MOST"  Form in Place? --         IV Access:   Peripheral IV   Procedures and diagnostic studies:   DG Pelvis 1-2 Views  Result Date: 10/08/2021 CLINICAL DATA:  Status post fall EXAM: PELVIS - 1-2 VIEW COMPARISON:  Renal stone protocol CT 10/30/2018 FINDINGS: Mild degenerative changes of the hips. Radiation seeds noted in the region the prostate. Soft tissues otherwise  unremarkable. IMPRESSION: No acute fracture or dislocation of the pelvis. Electronically Signed   By: Miachel Roux M.D.   On: 10/08/2021 11:57   DG Wrist Complete Right  Result Date: 10/08/2021 CLINICAL DATA:  Status post fall EXAM: RIGHT WRIST - COMPLETE 3+ VIEW COMPARISON:  None available FINDINGS: Extensive chondrocalcinosis of the triangular fibrocartilage. No fracture or dislocation. Moderate degenerative changes of the interphalangeal joint of the thumb. Mild degenerative changes at the first carpometacarpal joint. IMPRESSION: No acute abnormality of the right wrist. Electronically Signed   By: Miachel Roux M.D.   On: 10/08/2021 11:58   CT Head Wo Contrast  Result Date: 10/08/2021 CLINICAL DATA:  [place] in bathroom this morning. Low back pain. Patient reportedly did not hit his head. EXAM: CT HEAD WITHOUT CONTRAST CT CERVICAL SPINE WITHOUT CONTRAST TECHNIQUE: Multidetector CT imaging of the head and cervical spine was performed following the standard protocol without intravenous contrast. Multiplanar CT image reconstructions of the cervical spine were also generated. RADIATION DOSE REDUCTION: This exam was performed according to the departmental dose-optimization program which includes automated exposure control, adjustment of the mA and/or kV according to patient size and/or use of iterative reconstruction technique. COMPARISON:  08/13/2021. FINDINGS: CT HEAD FINDINGS Brain: No evidence of acute infarction, hemorrhage, hydrocephalus, extra-axial collection or mass lesion/mass effect. Vascular: No hyperdense vessel or unexpected calcification. Skull: Normal. Negative for fracture or focal lesion. Sinuses/Orbits: Globes and orbits are unremarkable. Sinuses are clear. Other: None. CT CERVICAL SPINE FINDINGS Alignment: Normal. Skull base and vertebrae: No acute fracture. No primary bone lesion or focal pathologic process. Soft tissues and spinal canal: No prevertebral fluid or swelling. No visible canal  hematoma. Disc levels: Minor loss of disc height at C5-C6. Moderate loss of disc height at C6-C7. Endplate spurring and mild disc bulging noted at C6-C7. Moderate left neural foraminal narrowing. No convincing disc herniation. Upper chest: No acute or significant abnormality. Other: None. IMPRESSION: HEAD CT 1. No acute intracranial abnormalities. CERVICAL CT 1. No fracture or acute finding. Electronically Signed   By: Lajean Manes M.D.   On: 10/08/2021 11:50   CT Cervical Spine Wo Contrast  Result Date: 10/08/2021 CLINICAL DATA:  [place] in bathroom this morning. Low back pain. Patient reportedly did not hit his head. EXAM: CT HEAD WITHOUT CONTRAST CT CERVICAL SPINE WITHOUT CONTRAST TECHNIQUE: Multidetector CT imaging of the head and cervical spine was performed following the standard protocol without intravenous contrast. Multiplanar CT image reconstructions of the cervical spine were also generated. RADIATION DOSE REDUCTION: This exam was performed according to the departmental dose-optimization program which includes automated exposure control, adjustment of the mA and/or kV according to patient size and/or use of iterative reconstruction technique. COMPARISON:  08/13/2021. FINDINGS: CT HEAD FINDINGS Brain: No evidence of acute infarction, hemorrhage, hydrocephalus, extra-axial collection or mass lesion/mass effect. Vascular: No hyperdense vessel or unexpected calcification. Skull: Normal. Negative for fracture or focal lesion. Sinuses/Orbits: Globes and orbits are unremarkable. Sinuses are clear. Other: None. CT CERVICAL SPINE FINDINGS Alignment: Normal. Skull base and vertebrae: No acute fracture. No primary bone lesion or focal  pathologic process. Soft tissues and spinal canal: No prevertebral fluid or swelling. No visible canal hematoma. Disc levels: Minor loss of disc height at C5-C6. Moderate loss of disc height at C6-C7. Endplate spurring and mild disc bulging noted at C6-C7. Moderate left neural  foraminal narrowing. No convincing disc herniation. Upper chest: No acute or significant abnormality. Other: None. IMPRESSION: HEAD CT 1. No acute intracranial abnormalities. CERVICAL CT 1. No fracture or acute finding. Electronically Signed   By: Lajean Manes M.D.   On: 10/08/2021 11:50   CT Thoracic Spine Wo Contrast  Result Date: 10/08/2021 CLINICAL DATA:  Fall in bathroom with low back pain EXAM: CT THORACIC AND LUMBAR SPINE WITHOUT CONTRAST TECHNIQUE: Multidetector CT imaging of the thoracic and lumbar spine was performed without contrast. Multiplanar CT image reconstructions were also generated. RADIATION DOSE REDUCTION: This exam was performed according to the departmental dose-optimization program which includes automated exposure control, adjustment of the mA and/or kV according to patient size and/or use of iterative reconstruction technique. COMPARISON:  None Available. FINDINGS: CT THORACIC SPINE FINDINGS Alignment: Exaggerated thoracic kyphosis.  No traumatic malalignment Vertebrae: Bridging osteophytes span T2 to the lumbar spine. No superimposed fracture is seen. Generalized osteopenia. Paraspinal and other soft tissues: No paraspinal hematoma or masslike finding. Disc levels: No visible herniation or impingement CT LUMBAR SPINE FINDINGS Segmentation: 5 lumbar type vertebrae Alignment: Normal Vertebrae: Rigid spine from T2-L2 with fracture through the anterior superior corner of L2 also traversing an anterior intervertebral osteophyte at L1-2. No displacement or visible posterior element involvement at this level. Additional fractures through anterior superior L4 endplate osteophyte. Paraspinal and other soft tissues: Mild paravertebral at edema at the level of fractures. Disc levels: Generalized disc bulging without focal high-grade stenosis is seen. IMPRESSION: 1. L2 anterior superior corner fracture also involving a L1-2 bridging osteophyte. No listhesis or visible posterior element  involvement. 2. L4 anterior superior osteophyte fracture. 3. Rigid spine from bridging osteophytes at T2-L2. Electronically Signed   By: Jorje Guild M.D.   On: 10/08/2021 11:52   CT Lumbar Spine Wo Contrast  Result Date: 10/08/2021 CLINICAL DATA:  Fall in bathroom with low back pain EXAM: CT THORACIC AND LUMBAR SPINE WITHOUT CONTRAST TECHNIQUE: Multidetector CT imaging of the thoracic and lumbar spine was performed without contrast. Multiplanar CT image reconstructions were also generated. RADIATION DOSE REDUCTION: This exam was performed according to the departmental dose-optimization program which includes automated exposure control, adjustment of the mA and/or kV according to patient size and/or use of iterative reconstruction technique. COMPARISON:  None Available. FINDINGS: CT THORACIC SPINE FINDINGS Alignment: Exaggerated thoracic kyphosis.  No traumatic malalignment Vertebrae: Bridging osteophytes span T2 to the lumbar spine. No superimposed fracture is seen. Generalized osteopenia. Paraspinal and other soft tissues: No paraspinal hematoma or masslike finding. Disc levels: No visible herniation or impingement CT LUMBAR SPINE FINDINGS Segmentation: 5 lumbar type vertebrae Alignment: Normal Vertebrae: Rigid spine from T2-L2 with fracture through the anterior superior corner of L2 also traversing an anterior intervertebral osteophyte at L1-2. No displacement or visible posterior element involvement at this level. Additional fractures through anterior superior L4 endplate osteophyte. Paraspinal and other soft tissues: Mild paravertebral at edema at the level of fractures. Disc levels: Generalized disc bulging without focal high-grade stenosis is seen. IMPRESSION: 1. L2 anterior superior corner fracture also involving a L1-2 bridging osteophyte. No listhesis or visible posterior element involvement. 2. L4 anterior superior osteophyte fracture. 3. Rigid spine from bridging osteophytes at T2-L2.  Electronically Signed  By: Jorje Guild M.D.   On: 10/08/2021 11:52   US Abdomen Complete  Result Date: 10/08/2021 CLINICAL DATA:  Patient with previous history of melanoma, and right nephrectomy for carcinoma. Elevated LFTs with AKI. Nearly 10 cm liver mass noted on renal ultrasound 09/06/2021. EXAM: ABDOMEN ULTRASOUND COMPLETE COMPARISON:  Renal ultrasound 09/06/2021 FINDINGS: Gallbladder: No gallstones or wall thickening visualized. No sonographic Murphy sign noted by sonographer. Common bile duct: Diameter: Nonvisualized. Liver: There is heterogeneous solid mass in the right lobe, today measuring 11.6 x 11.1 x 9.2 cm, previously 9.9 x 9.0 x 9.3 cm. Otherwise within normal limits in parenchymal echogenicity. Portal vein is patent on color Doppler imaging with normal direction of blood flow towards the liver. IVC: No abnormality visualized. Pancreas: Not seen, obscured by bowel gas. Spleen: Size and appearance within normal limits. Length measurement is 10.8 cm. Right Kidney: Length: Surgically absent. Left Kidney: Length: 10.4 cm. Echogenicity within normal limits. No mass or hydronephrosis visualized. Abdominal aorta: No aneurysm visualized. Other findings: No free fluid is seen. Technically difficult due to: Patient inability to assist with positioning or take deep breaths, inability to hold his breath, and also suboptimal image quality due to multifocal bowel gas shadowing and patient body habitus. IMPRESSION: 1. Surgically absent right kidney with sonographically unremarkable left kidney, as far as seen. Image quality limited. 2. Enlarging mass in the right lobe of the liver, now nearly 12 cm in size most likely representing a primary or metastatic neoplasm. 3. Nonvisualization of the extrahepatic bile ducts and pancreas. Electronically Signed   By: Telford Nab M.D.   On: 10/08/2021 21:15   MR LIVER WO CONRTAST  Result Date: 10/09/2021 CLINICAL DATA:  Liver mass on ultrasound. History of  melanoma and right nephrectomy for renal cell carcinoma. Acute renal insufficiency. Dementia. EXAM: MRI ABDOMEN WITHOUT CONTRAST TECHNIQUE: Multiplanar multisequence MR imaging was performed without the administration of intravenous contrast. COMPARISON:  Ultrasound 10/08/2021. Abdominal CT of 11/29/2018. PET of 02/19/2018. FINDINGS: Exam is mildly degraded by lack of IV contrast and mild motion. Lower chest: Small right pleural effusion.  Mild cardiomegaly. Hepatobiliary: No evidence of cirrhosis. Dominant right hepatic lobe mass measures 10.5 x 12.5 x 12.7 cm on 21/5 and 21/3. Demonstrates heterogeneous primarily hyperintense T2 signal. Areas of precontrast T1 hyperintensity throughout including on series 12. Normal gallbladder, without biliary ductal dilatation. Pancreas:  Normal, without mass or ductal dilatation. Spleen:  Normal in size, without focal abnormality. Adrenals/Urinary Tract: Normal right adrenal gland. Left renal cortical thinning. Right nephrectomy, without local recurrence. Stomach/Bowel: Normal stomach and abdominal bowel loops. Vascular/Lymphatic: Normal caliber of the aorta and branch vessels. No retroperitoneal or retrocrural adenopathy. Other:  No ascites. Musculoskeletal: No acute osseous abnormality. IMPRESSION: 1. Dominant right hepatic lobe mass is new since the CT of 11/29/2018. 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 Renal cell carcinoma metastasis or primary malignancy such as hepatocellular carcinoma felt less likely. 2. Decreased sensitivity and specificity exam due to technique related factors, as described above. 3. Right nephrectomy, without local recurrence. 4. Small right pleural effusion. Electronically Signed   By: Abigail Miyamoto M.D.   On: 10/09/2021 12:34   DG Chest Portable 1 View  Result Date: 10/08/2021 CLINICAL DATA:  Status post fall EXAM: PORTABLE CHEST - 1 VIEW COMPARISON:  07/26/2016 FINDINGS:  Apparent cardiomegaly most likely related to extra tori phase of imaging and portable technique. No pulmonary vascular congestion.  Lungs clear.  No pneumothorax. IMPRESSION: No acute cardiopulmonary  process. Electronically Signed   By: Miachel Roux M.D.   On: 10/08/2021 11:55     Medical Consultants:   None.   Subjective:    [name] relates his pain is not controlled has no appetite.  Objective:    Vitals:   10/09/21 0421 10/09/21 1215 10/09/21 1347 10/10/21 0556  BP: 133/72 (!) 147/70 (!) 114/54 (!) 153/74  Pulse: 88 84 74 80  Resp: '14 20 19 '$ (!) 22  Temp: 100.1 F (37.8 C) 98.3 F (36.8 C) 100.2 F (37.9 C) 99.5 F (37.5 C)  TempSrc: Oral Oral Oral Oral  SpO2: 90% 92% 94% 91%  Weight:      Height:       SpO2: 91 %   Intake/Output Summary (Last 24 hours) at 10/10/2021 0926 Last data filed at 10/09/2021 1838 Gross per 24 hour  Intake 500.73 ml  Output 250 ml  Net 250.73 ml   Filed Weights   10/08/21 1036 10/08/21 1542  Weight: 81.6 kg 83.6 kg    Exam: General exam: In no acute distress. Respiratory system: Good air movement and clear to auscultation. Cardiovascular system: S1 & S2 heard, RRR. No JVD. Gastrointestinal system: Abdomen is nondistended, soft and nontender.  Extremities: No pedal edema. Skin: No rashes, lesions or ulcers Psychiatry: Judgement and insight appear normal. Mood & affect appropriate.    Data Reviewed:    Labs: Basic Metabolic Panel: Recent Labs  Lab 10/08/21 1115 10/09/21 0611 10/10/21 0610  NA 140 141 142  K 4.8 4.6 4.7  CL 110 111 112*  CO2 19* 22 22  GLUCOSE 260* 119* 81  BUN 48* 49* 63*  CREATININE 3.15* 3.00* 3.73*  CALCIUM 8.5* 8.1* 8.3*   GFR Estimated Creatinine Clearance: 16.8 mL/min (A) (by C-G formula based on SCr of 3.73 mg/dL (H)). Liver Function Tests: Recent Labs  Lab 10/08/21 1115 10/09/21 0611 10/10/21 0610  AST 165* 118* 142*  ALT 84* 69* 82*  ALKPHOS 342* 299* 290*  BILITOT 1.2  1.1 1.3*  PROT 6.5 5.9* 5.8*  ALBUMIN 2.8* 2.5* 2.3*   No results for input(s): LIPASE, AMYLASE in the last 168 hours. Recent Labs  Lab 10/10/21 0610  AMMONIA 13   Coagulation profile No results for input(s): INR, PROTIME in the last 168 hours. COVID-19 Labs  Recent Labs    10/10/21 0610  FERRITIN 364*    Lab Results  Component Value Date   SARSCOV2NAA NEGATIVE 10/08/2021   [place] NEGATIVE 11/29/2018    CBC: Recent Labs  Lab 10/08/21 1115 10/09/21 0611 10/10/21 0610  WBC 15.6* 12.5* 13.1*  NEUTROABS 13.2*  --   --   HGB 11.2* 10.3* 9.8*  HCT 34.3* 32.9* 31.7*  MCV 86.4 87.7 88.5  PLT 127* 113* 122*   Cardiac Enzymes: Recent Labs  Lab 10/08/21 1115 10/09/21 0611 10/10/21 0610  CKTOTAL 3,434* 1,267* 592*   BNP (last 3 results) No results for input(s): PROBNP in the last 8760 hours. CBG: Recent Labs  Lab 10/09/21 0727 10/09/21 1143 10/09/21 1626 10/09/21 2202 10/10/21 0731  GLUCAP 101* 128* 161* 97 80   D-Dimer: No results for input(s): DDIMER in the last 72 hours. Hgb A1c: Recent Labs    10/08/21 1500  HGBA1C 8.8*   Lipid Profile: No results for input(s): CHOL, HDL, LDLCALC, TRIG, CHOLHDL, LDLDIRECT in the last 72 hours. Thyroid function studies: No results for input(s): TSH, T4TOTAL, T3FREE, THYROIDAB in the last 72 hours.  Invalid input(s): FREET3 Anemia work up: [employer]  10/10/21 0610  VITAMINB12 >7,500*  FOLATE 8.9  FERRITIN 364*  TIBC 198*  IRON 14*  RETICCTPCT 0.8   Sepsis Labs: Recent Labs  Lab 10/08/21 1115 10/09/21 0611 10/10/21 0610  WBC 15.6* 12.5* 13.1*   Microbiology Recent Results (from the past 240 hour(s))  Resp Panel by RT-PCR (Flu A&B, Covid) Anterior Nasal Swab     Status: None   Collection Time: 10/08/21 11:09 AM   Specimen: Anterior Nasal Swab  Result Value Ref Range Status   SARS Coronavirus 2 by RT PCR NEGATIVE NEGATIVE Final    Comment: (NOTE) SARS-CoV-2 target nucleic acids are NOT  DETECTED.  The SARS-CoV-2 RNA is generally detectable in upper respiratory specimens during the acute phase of infection. The lowest concentration of SARS-CoV-2 viral copies this assay can detect is 138 copies/mL. A negative result does not preclude SARS-Cov-2 infection and should not be used as the sole basis for treatment or other patient management decisions. A negative result may occur with  improper specimen collection/handling, submission of specimen other than nasopharyngeal swab, presence of viral mutation(s) within the areas targeted by this assay, and inadequate number of viral copies(<138 copies/mL). A negative result must be combined with clinical observations, patient history, and epidemiological information. The expected result is Negative.  Fact Sheet for Patients:  EntrepreneurPulse.com.au  Fact Sheet for Healthcare Providers:  IncredibleEmployment.be  This test is no t yet approved or cleared by the [place] FDA and  has been authorized for detection and/or diagnosis of SARS-CoV-2 by FDA under an Emergency Use Authorization (EUA). This EUA will remain  in effect (meaning this test can be used) for the duration of the COVID-19 declaration under Section 564(b)(1) of the Act, 21 U.S.C.section 360bbb-3(b)(1), unless the authorization is terminated  or revoked sooner.       Influenza A by PCR NEGATIVE NEGATIVE Final   Influenza B by PCR NEGATIVE NEGATIVE Final    Comment: (NOTE) The Xpert Xpress SARS-CoV-2/FLU/RSV plus assay is intended as an aid in the diagnosis of influenza from Nasopharyngeal swab specimens and should not be used as a sole basis for treatment. Nasal washings and aspirates are unacceptable for Xpert Xpress SARS-CoV-2/FLU/RSV testing.  Fact Sheet for Patients: EntrepreneurPulse.com.au  Fact Sheet for Healthcare Providers: IncredibleEmployment.be  This test is not yet  approved or cleared by the [place] FDA and has been authorized for detection and/or diagnosis of SARS-CoV-2 by FDA under an Emergency Use Authorization (EUA). This EUA will remain in effect (meaning this test can be used) for the duration of the COVID-19 declaration under Section 564(b)(1) of the Act, 21 U.S.C. section 360bbb-3(b)(1), unless the authorization is terminated or revoked.  Performed at [facility], [place] [address] [place], [place] [address]      Medications:    amLODipine  2.5 mg Oral Daily   aspirin EC  81 mg Oral Daily   dorzolamide-timolol  1 drop Both Eyes BID   insulin aspart  0-15 Units Subcutaneous TID WC   isosorbide mononitrate  60 mg Oral Daily   latanoprost  1 drop Both Eyes QHS   nebivolol  10 mg Oral Daily   pantoprazole  40 mg Oral Daily   rosuvastatin  20 mg Oral QHS   Continuous Infusions:  sodium chloride 75 mL/hr at 10/10/21 0101      LOS: 2 days   Charlynne Cousins  [facility]  10/10/2021, 9:26 AM

## 2021-10-11 DIAGNOSIS — N179 Acute kidney failure, unspecified: Secondary | ICD-10-CM | POA: Diagnosis not present

## 2021-10-11 DIAGNOSIS — N184 Chronic kidney disease, stage 4 (severe): Secondary | ICD-10-CM | POA: Diagnosis not present

## 2021-10-11 DIAGNOSIS — E872 Acidosis, unspecified: Secondary | ICD-10-CM

## 2021-10-11 DIAGNOSIS — L8990 Pressure ulcer of unspecified site, unspecified stage: Secondary | ICD-10-CM | POA: Insufficient documentation

## 2021-10-11 DIAGNOSIS — T796XXA Traumatic ischemia of muscle, initial encounter: Secondary | ICD-10-CM | POA: Diagnosis not present

## 2021-10-11 DIAGNOSIS — W19XXXA Unspecified fall, initial encounter: Secondary | ICD-10-CM | POA: Diagnosis not present

## 2021-10-11 LAB — BASIC METABOLIC PANEL
Anion gap: 12 (ref 5–15)
BUN: 74 mg/dL — ABNORMAL HIGH (ref 8–23)
CO2: 19 mmol/L — ABNORMAL LOW (ref 22–32)
Calcium: 8.3 mg/dL — ABNORMAL LOW (ref 8.9–10.3)
Chloride: 108 mmol/L (ref 98–111)
Creatinine, Ser: 4.22 mg/dL — ABNORMAL HIGH (ref 0.61–1.24)
GFR, Estimated: 13 mL/min — ABNORMAL LOW (ref >60)
Glucose, Bld: 111 mg/dL — ABNORMAL HIGH (ref 70–99)
Potassium: 5.3 mmol/L — ABNORMAL HIGH (ref 3.5–5.1)
Sodium: 139 mmol/L (ref 135–145)

## 2021-10-11 LAB — GLUCOSE, CAPILLARY
Glucose-Capillary: 95 mg/dL (ref 70–99)
Glucose-Capillary: 110 mg/dL — ABNORMAL HIGH (ref 70–99)
Glucose-Capillary: 184 mg/dL — ABNORMAL HIGH (ref 70–99)
Glucose-Capillary: 161 mg/dL — ABNORMAL HIGH (ref 70–99)

## 2021-10-11 LAB — CK: Total CK: 327 U/L (ref 49–397)

## 2021-10-11 MED ORDER — SODIUM ZIRCONIUM CYCLOSILICATE 10 G PO PACK
10.0000 g | PACK | Freq: Three times a day (TID) | ORAL | Status: AC
  Administered 2021-10-11: 10 g via ORAL
  Filled 2021-10-11 (×2): qty 1

## 2021-10-11 MED ORDER — SODIUM BICARBONATE 650 MG PO TABS
650.0000 mg | ORAL_TABLET | Freq: Three times a day (TID) | ORAL | Status: AC
  Administered 2021-10-11 (×2): 650 mg via ORAL
  Filled 2021-10-11 (×2): qty 1

## 2021-10-11 MED ORDER — SODIUM POLYSTYRENE SULFONATE 15 GM/60ML PO SUSP
15.0000 g | Freq: Two times a day (BID) | ORAL | Status: DC
Start: 2021-10-11 — End: 2021-10-11
  Administered 2021-10-11: 15 g via ORAL
  Filled 2021-10-11: qty 60

## 2021-10-11 MED ORDER — SODIUM CHLORIDE 0.9 % IV SOLN: INTRAVENOUS | Status: DC

## 2021-10-11 NOTE — Consult Note (Signed)
Renal Service Consult Note [facility]  [name] 10/11/2021 Andre Blazing, MD Requesting Physician: Dr. Aileen Townsend  Reason for Consult: Renal failure HPI: The patient is a 81 y.o. 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 In ED found to have ^CPK/ rhabdo, an L2 fracture and CT abdomen showing large liver mass.  Creat in April was 4.2. Creat here was 3.15 on admit and is up to 4.2 today. We are asked to see for renal failure.   Pt seen in room. Pt is elderly and fatigued and appears to be in pain. No distress though. Vague/ poor historian. Knows "[name]", didn't reply to "what year is it?" Says he sees Dr [name] at [facility].    ROS - denies CP, no joint pain, no HA, no blurry vision, no rash, no diarrhea, no nausea/ vomiting, no dysuria, no difficulty voiding   Past Medical History  Past Medical History:  Diagnosis Date   Arthritis    "little in my right hand; some in my right big toe" (10/22/2016)   Borderline diabetes    "tx'd w/RX; blood surgars dropped too low; dr. 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Clay Townsend)    "dormant for the last 2-3 years" (10/22/2016)   NSTEMI (non-ST elevated myocardial infarction) ([name]) 09/25/2016   Oncocytoma 1993   s/p right nephrectomy at [facility] in [place] 2006   s/p brachytherapy   Seborrheic keratosis    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   history of nephrectomy in 1984   Past Surgical History  Past Surgical History:  Procedure Laterality Date   [place]  09/26/2016; 10/22/2016   "4 stents; 2 stents"   CORONARY STENT INTERVENTION Right 09/26/2016   Procedure: Coronary Stent Intervention;  Surgeon: Andre Prows, MD;  Location: [employer] [facility];  Service: Cardiovascular;  Laterality: Right;   CORONARY STENT INTERVENTION N/A 10/22/2016   Procedure: Coronary Stent Intervention;  Surgeon: Andre Prows, MD;  Location: [place] [facility];  Service: Cardiovascular;  Laterality: N/A;   CYSTOSCOPY/URETEROSCOPY/HOLMIUM LASER/STENT PLACEMENT Left 11/29/2018   Procedure: CYSTOSCOPY/URETEROSCOPY/HOLMIUM LASER/STENT PLACEMENTleft retrograde pylegram;  Surgeon: Andre Hughs, MD;  Location: [facility];  Service: Urology;  Laterality: Left;   HOLMIUM LASER APPLICATION  0/35/5974   Procedure: HOLMIUM LASER APPLICATION;  Surgeon: Andre Hughs, MD;  Location: [facility];  Service: Urology;;   INSERTION PROSTATE RADIATION SEED  2009?   LEFT HEART CATH AND CORONARY ANGIOGRAPHY N/A 09/26/2016   Procedure: Left Heart Cath and Coronary Angiography;  Surgeon: Andre Prows, MD;  Location: [place] [facility];  Service: Cardiovascular;  Laterality: N/A;   NEPHRECTOMY Right Newborn  04/2016   "tore all the ligaments; had to put 3 pins in to reattach; Dr. Gladstone Townsend"   TONSILLECTOMY     Family History  Family History  Problem Relation Age of Onset   Diabetes Mother    Heart attack Father    Social History  reports that he has never smoked. He has never used smokeless tobacco. He reports that he does not currently use alcohol. He reports that he does not use drugs. Allergies  Allergies  Allergen Reactions   Sulfa Antibiotics Hives   Morphine And Related Other (See Comments)    Spontaneous body movements   Lisinopril Cough   Home medications Prior to Admission medications   Medication Sig Start Date End Date Taking? Authorizing Provider  amLODipine (NORVASC) 2.5 MG tablet Take  2.5 mg by mouth daily. 09/16/21  Yes [provider]  aspirin EC 81 MG tablet Take 81 mg by mouth daily.   Yes [provider]  BYSTOLIC 20 MG TABS Take 10 mg by mouth daily. daily   Yes [provider]  dorzolamide-timolol (COSOPT) 22.3-6.8 MG/ML ophthalmic solution Place 1 drop into both eyes 2 (two) times daily. 09/26/21  Yes [provider]  isosorbide mononitrate (IMDUR) 60 MG 24 hr tablet Take 1 tablet (60 mg total) by mouth daily. 06/27/21  Yes Andre Prows, MD  latanoprost (XALATAN) 0.005 % ophthalmic solution Place 1 drop into both eyes at bedtime. 07/14/21  Yes [provider]  nitroGLYCERIN (NITROSTAT) 0.4 MG SL tablet Place 1 tablet (0.4 mg total) under the tongue every 5 (five) minutes x 3 doses as needed for chest pain. 06/27/21  Yes Andre Prows, MD  pantoprazole (PROTONIX) 40 MG tablet Take 40 mg by mouth daily.   Yes [provider]  rosuvastatin (CRESTOR) 20 MG tablet Take 1 tablet (20 mg total) by mouth at bedtime. 06/27/21  Yes Andre Prows, MD  TRESIBA FLEXTOUCH 100 UNIT/ML FlexTouch Pen Inject 10 Units into the skin daily. 08/22/21  Yes [provider]  Finerenone (KERENDIA) 10 MG TABS Take 1 tablet by mouth daily. Patient not taking: Reported on 10/08/2021 06/27/21   Andre Prows, MD     Vitals:   10/10/21 0556 10/10/21 1453 10/10/21 2052 10/11/21 0437  BP: (!) 153/74 (!) 97/55 118/62 139/65  Pulse: 80 66 64 70  Resp: (!) '22 16 16 16  ' Temp: 99.5 F (37.5 C)  (!) 97.3 F (36.3 C) (!) 97.4 F (36.3 C)  TempSrc: Oral  Oral Oral  SpO2: 91% 97% 93% 100%  Weight:      Height:       Exam Gen awake, a bit lethargic, sitting up w/ bed at 45 deg angle Chornically ill appearing No rash, cyanosis or gangrene Sclera anicteric, throat clear  No jvd or bruits Chest clear bilat to bases, no rales/ wheezing RRR no RG Abd soft ntnd no mass or ascites +bs GU normal male MS no joint effusions or deformity Ext trace- 1+ UE edema,  no LE edema, no wounds or ulcers Neuro is alert, Ox 2, no asterixis or jerking     CXR 5/29 - IMPRESSION: No acute cardiopulmonary process.   Abd [place] 5/29 - Right Kidney: Surgically absent. Left Kidney: 10.4 cm. Echogenicity within normal limits. No mass or hydronephrosis visualized.    MR liver noncon - Hepatobiliary: No evidence of cirrhosis. Dominant right hepatic lobe mass measures 10.5 x 12.5 x 12.7 cm on 21/5 and 21/3. Demonstrates heterogeneous primarily hyperintense T2 signal. Areas of precontrast T1 hyperintensity throughout including on series 12.   Home meds include - norvasc 2.5, asa, bystolic 31DV qd, cosopt, isosorbide mononitrate, nitroglycerin sl, pantoprazole, rosuvastatin, tresiba insulin, finerenone  10 qd        Date   Creat  eGFR    2011   1.48- 1.59    2012- 2014  1.54- 2.00    2018   1.59- 1.98 31- 40 ml/min    2020     5.92 >> 2.10 AKI episode, 8 -> 21 ml/min    August 13, 2021  4.22  13         10/08/21  3.15  19    10/09/21  3.00  20    10/10/21  3.73  15    10/11/21  4.22  13     Total I/O here 4.7 L in and 2.2 L out    UOP last 3 days > 1025cc, 800 cc, 400cc today     BP's here stable 120-150 SBP      RA 95%  HR 80s  RR 17- 22      Na 139  K+ 5.3 today  BUN 74  Cr 4.22  alb 2.3   Tprot 5.8       AST 142  ALT 82  Tbili 1.3       CPK 3234 > 1267 > 592 > 327 today      WBC 13k  HB 9.8  plt 122     UA  5/29 - large Hb w/ 0-5 rbc => myoglobin,  100 prot, 0-5  wbc, rare bacteria        Assessment/ Plan: AKI on CKD IV - b/l creat 3.1- 3.7, eGFR 15-20.  Creat here was 3.1 on admission 5/29, now up to 4.2.  Need outpt records.  Pt is elderly, may be slightly confused today, not sure. Abd US shows absent R kidney and normal appearing L kidney w/o obstruction. UA showed probable myoglobin (large Hb w/ min rbc's) and w/ CPK > 5000 suspect rhabdomyolysis ATN is cause of his AKI.  Rx is supportive care. Cont low rate IVF's at 65 cc/hr. Hx of MGUS > will add serum free LC's to  SPEP/ UPEP already ordered, however, with his low total protein lab full blown myeloma is unlikely. BP's are good, don't overtreat BP, keep SBP > 110-120 w/ AKI. Will follow.  SP fall - at home, here w/ acute rhabdomyolysis due to compression/ being found down. PT recommended rehab HTN - getting BB and norvasc, can continue these Hyperkalemia - give lokelma x 2 tonight, changed to renal / carb mod diet.  Volume - mild edema of the UE's, LE's and lungs clear. Follow.  MGUS - as above Liver mass - per pmd DM2    Rob Heber Hoog  MD 10/11/2021, 1:43 PM Recent Labs  Lab 10/09/21 0611 10/10/21 0610 10/11/21 0625  HGB 10.3* 9.8*  --   ALBUMIN 2.5* 2.3*  --   CALCIUM 8.1* 8.3* 8.3*  CREATININE 3.00* 3.73* 4.22*  K 4.6 4.7 5.3*

## 2021-10-11 NOTE — Progress Notes (Addendum)
[facility] PROGRESS NOTE    Progress Note  [name]  FYB:[id number] DOB: 02/14/1941 DOA: 10/08/2021 PCP: Burnard Bunting, MD     Brief Narrative:   [name] is an 81 y.o. 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, essential hypertension, prostate cancer with a history of  insertion of radiation seeds fell at home could not get up found by EMS in the ED was found to be in rhabdomyolysis, and an L2 anterior superior fracture, abdominal ultrasound showed absent right kidney unremarkable left an enlarging mass in the right lobe of the liver about 12 cm, MRI of the liver done that showed right hepatic lobe which is new with precontrast T1 hyperintense density which favors isolated melanoma    Assessment/Plan:   Fall at home, initial encounter/with fracture involving L2 vertebrae: Neurosurgery was curb sided who recommended DLCO. Follow-up with them as an outpatient. Physical therapy evaluated the patient recommended rehab. Relates his pain is controlled feels much better than yesterday.  Nontraumatic rhabdomyolysis: Improved  with IV fluid hydration KVO IV fluids.  Acute kidney injury on chronic kidney disease stage IV: Baseline creatinine unclear back in April 2023 it was 4. He has been followed up as an outpatient on this as his creatinine has been slowly deteriorating. Creatinine this morning is 4. Check a basic metabolic panel in am. Check an SPEP and a UPEP renal ultrasound last month showed 12.1 x 5.1 x 4.3 cm No gross abnormality identified. No hydronephrosis. Consult renal.  Hyperkalemia: Given Kayexalate p.o. twice daily recheck a potassium level tomorrow morning.  Non-anion gap metabolic acidosis: Likely due to renal dysfunction with started on bicarbonate tablets.  Essential hypertension: Continue bisoprolol and Imdur hold  Norvasc.  MGUS: Follow-up by [facility], is currently on no active treatment.  Liver mass/abnormal LFT's: Abdominal ultrasound showed liver mass. MRI was done that showed a new liver mass concerning for malignancy. Alpha-fetoprotein 5.7 Hepatitis panel is negative. His hematologist oncologist knows about his hepatic mass they have a follow-up ointment to discuss if he would like to pursue any further evaluation and treatment. Talk to the patient that there is probably a malignancy and that he needs to follow-up with his own oncologist as an outpatient.  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 Was on Tarceva prior to admission. Sliding scale insulin blood glucose this morning is well controlled in the 80s.  Normocytic anemia: Likely related to MGUS. Iron 14 saturation 7 ferritin of 364. B12 greater than 7000.  Leukocytosis: Temperature is down, his leukocytosis is likely reactive.   DVT prophylaxis: lovenox Family Communication:wife Status is: Inpatient Remains inpatient appropriate because: Pain not controlled has not had a bowel movement now tolerating his diet.    Code Status:     Code Status Orders  (From admission, onward)           Start     Ordered   10/08/21 1541  Full code  Continuous        10/08/21 1540           Code Status History     Date Active Date Inactive Code Status Order ID Comments User Context   11/29/2018 1804 12/02/2018 1435 DNR [id number]  Donne Hazel, MD Inpatient   10/22/2016 1206 10/23/2016 1407 Full Code [id number]  Adrian Prows, MD Inpatient   09/25/2016  1244 09/28/2016 1624 Full Code [id number]  Adrian Prows, MD ED      Advance Directive Documentation    Flowsheet Row Most Recent Value  Type of Advance Directive Healthcare Power of Attorney  Pre-existing out of facility DNR order (yellow form or pink MOST form) --  "MOST" Form in Place? --         IV Access:    Peripheral IV   Procedures and diagnostic studies:   MR LIVER WO CONRTAST  Result Date: 10/09/2021 CLINICAL DATA:  Liver mass on ultrasound. 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 Acute renal insufficiency. Dementia. EXAM: MRI ABDOMEN WITHOUT CONTRAST TECHNIQUE: Multiplanar multisequence MR imaging was performed without the administration of intravenous contrast. COMPARISON:  Ultrasound 10/08/2021. Abdominal CT of 11/29/2018. PET of 02/19/2018. FINDINGS: Exam is mildly degraded by lack of IV contrast and mild motion. Lower chest: Small right pleural effusion.  Mild cardiomegaly. Hepatobiliary: No evidence of cirrhosis. Dominant right hepatic lobe mass measures 10.5 x 12.5 x 12.7 cm on 21/5 and 21/3. Demonstrates heterogeneous primarily hyperintense T2 signal. Areas of precontrast T1 hyperintensity throughout including on series 12. Normal gallbladder, without biliary ductal dilatation. Pancreas:  Normal, without mass or ductal dilatation. Spleen:  Normal in size, without focal abnormality. Adrenals/Urinary Tract: Normal right adrenal gland. Left renal cortical thinning. Right nephrectomy, without local recurrence. Stomach/Bowel: Normal stomach and abdominal bowel loops. Vascular/Lymphatic: Normal caliber of the aorta and branch vessels. No retroperitoneal or retrocrural adenopathy. Other:  No ascites. Musculoskeletal: No acute osseous abnormality. IMPRESSION: 1. Dominant right hepatic lobe mass is new since the CT of 11/29/2018. 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 Renal cell carcinoma metastasis or primary malignancy such as hepatocellular carcinoma felt less likely. 2. Decreased sensitivity and specificity exam due to technique related factors, as described above. 3. Right nephrectomy, without local recurrence. 4. Small right pleural effusion. Electronically Signed   By: Abigail Miyamoto M.D.   On: 10/09/2021  12:34     Medical Consultants:   None.   Subjective:    [name] relates he has an appetite today his pain is controlled.  Objective:    Vitals:   10/10/21 0556 10/10/21 1453 10/10/21 2052 10/11/21 0437  BP: (!) 153/74 (!) 97/55 118/62 139/65  Pulse: 80 66 64 70  Resp: (!) '22 16 16 16  '$ Temp: 99.5 F (37.5 C)  (!) 97.3 F (36.3 C) (!) 97.4 F (36.3 C)  TempSrc: Oral  Oral Oral  SpO2: 91% 97% 93% 100%  Weight:      Height:       SpO2: 100 % O2 Flow Rate (L/min): 1 L/min   Intake/Output Summary (Last 24 hours) at 10/11/2021 1015 Last data filed at 10/11/2021 0714 Gross per 24 hour  Intake 1125.37 ml  Output 1200 ml  Net -74.63 ml    Filed Weights   10/08/21 1036 10/08/21 1542  Weight: 81.6 kg 83.6 kg    Exam: General exam: In no acute distress. Respiratory system: Good air movement and clear to auscultation. Cardiovascular system: S1 & S2 heard, RRR. No JVD. Gastrointestinal system: Abdomen is nondistended, soft and nontender.  Extremities: No pedal edema. Skin: No rashes, lesions or ulcers Psychiatry: Judgement and insight appear normal. Mood & affect appropriate.   Data Reviewed:    Labs: Basic Metabolic Panel: Recent Labs  Lab 10/08/21 1115 10/09/21 0611 10/10/21 0610 10/11/21 0625  NA 140 141 142 139  K 4.8 4.6 4.7  5.3*  CL 110 111 112* 108  CO2 19* 22 22 19*  GLUCOSE 260* 119* 81 111*  BUN 48* 49* 63* 74*  CREATININE 3.15* 3.00* 3.73* 4.22*  CALCIUM 8.5* 8.1* 8.3* 8.3*    GFR Estimated Creatinine Clearance: 14.8 mL/min (A) (by C-G formula based on SCr of 4.22 mg/dL (H)). Liver Function Tests: Recent Labs  Lab 10/08/21 1115 10/09/21 0611 10/10/21 0610  AST 165* 118* 142*  ALT 84* 69* 82*  ALKPHOS 342* 299* 290*  BILITOT 1.2 1.1 1.3*  PROT 6.5 5.9* 5.8*  ALBUMIN 2.8* 2.5* 2.3*    No results for input(s): LIPASE, AMYLASE in the last 168 hours. Recent Labs  Lab 10/10/21 0610  AMMONIA 13    Coagulation profile No  results for input(s): INR, PROTIME in the last 168 hours. COVID-19 Labs  Recent Labs    10/10/21 0610  FERRITIN 364*     Lab Results  Component Value Date   SARSCOV2NAA NEGATIVE 10/08/2021   [place] NEGATIVE 11/29/2018    CBC: Recent Labs  Lab 10/08/21 1115 10/09/21 0611 10/10/21 0610  WBC 15.6* 12.5* 13.1*  NEUTROABS 13.2*  --   --   HGB 11.2* 10.3* 9.8*  HCT 34.3* 32.9* 31.7*  MCV 86.4 87.7 88.5  PLT 127* 113* 122*    Cardiac Enzymes: Recent Labs  Lab 10/08/21 1115 10/09/21 0611 10/10/21 0610  CKTOTAL 3,434* 1,267* 592*    BNP (last 3 results) No results for input(s): PROBNP in the last 8760 hours. CBG: Recent Labs  Lab 10/10/21 0731 10/10/21 1206 10/10/21 1711 10/10/21 2058 10/11/21 0717  GLUCAP 80 143* 164* 151* 95    D-Dimer: No results for input(s): DDIMER in the last 72 hours. Hgb A1c: Recent Labs    10/08/21 1500  HGBA1C 8.8*    Lipid Profile: No results for input(s): CHOL, HDL, LDLCALC, TRIG, CHOLHDL, LDLDIRECT in the last 72 hours. Thyroid function studies: No results for input(s): TSH, T4TOTAL, T3FREE, THYROIDAB in the last 72 hours.  Invalid input(s): FREET3 Anemia work up: Recent Labs    10/10/21 0610  VITAMINB12 >7,500*  FOLATE 8.9  FERRITIN 364*  TIBC 198*  IRON 14*  RETICCTPCT 0.8    Sepsis Labs: Recent Labs  Lab 10/08/21 1115 10/09/21 0611 10/10/21 0610  WBC 15.6* 12.5* 13.1*    Microbiology Recent Results (from the past 240 hour(s))  Resp Panel by RT-PCR (Flu A&B, Covid) Anterior Nasal Swab     Status: None   Collection Time: 10/08/21 11:09 AM   Specimen: Anterior Nasal Swab  Result Value Ref Range Status   SARS Coronavirus 2 by RT PCR NEGATIVE NEGATIVE Final    Comment: (NOTE) SARS-CoV-2 target nucleic acids are NOT DETECTED.  The SARS-CoV-2 RNA is generally detectable in upper respiratory specimens during the acute phase of infection. The lowest concentration of SARS-CoV-2 viral copies this  assay can detect is 138 copies/mL. A negative result does not preclude SARS-Cov-2 infection and should not be used as the sole basis for treatment or other patient management decisions. A negative result may occur with  improper specimen collection/handling, submission of specimen other than nasopharyngeal swab, presence of viral mutation(s) within the areas targeted by this assay, and inadequate number of viral copies(<138 copies/mL). A negative result must be combined with clinical observations, patient history, and epidemiological information. The expected result is Negative.  Fact Sheet for Patients:  EntrepreneurPulse.com.au  Fact Sheet for Healthcare Providers:  IncredibleEmployment.be  This test is no t yet approved or cleared by  the [employer] and  has been authorized for detection and/or diagnosis of SARS-CoV-2 by FDA under an Emergency Use Authorization (EUA). This EUA will remain  in effect (meaning this test can be used) for the duration of the COVID-19 declaration under Section 564(b)(1) of the Act, 21 U.S.C.section 360bbb-3(b)(1), unless the authorization is terminated  or revoked sooner.       Influenza A by PCR NEGATIVE NEGATIVE Final   Influenza B by PCR NEGATIVE NEGATIVE Final    Comment: (NOTE) The Xpert Xpress SARS-CoV-2/FLU/RSV plus assay is intended as an aid in the diagnosis of influenza from Nasopharyngeal swab specimens and should not be used as a sole basis for treatment. Nasal washings and aspirates are unacceptable for Xpert Xpress SARS-CoV-2/FLU/RSV testing.  Fact Sheet for Patients: EntrepreneurPulse.com.au  Fact Sheet for Healthcare Providers: IncredibleEmployment.be  This test is not yet approved or cleared by the [place] FDA and has been authorized for detection and/or diagnosis of SARS-CoV-2 by FDA under an Emergency Use Authorization (EUA). This EUA will  remain in effect (meaning this test can be used) for the duration of the COVID-19 declaration under Section 564(b)(1) of the Act, 21 U.S.C. section 360bbb-3(b)(1), unless the authorization is terminated or revoked.  Performed at [facility], [place] [address], [place], [place] [address]      Medications:    amLODipine  2.5 mg Oral Daily   aspirin EC  81 mg Oral Daily   dorzolamide-timolol  1 drop Both Eyes BID   heparin injection (subcutaneous)  5,000 Units Subcutaneous Q8H   insulin aspart  0-15 Units Subcutaneous TID WC   isosorbide mononitrate  60 mg Oral Daily   latanoprost  1 drop Both Eyes QHS   mupirocin cream   Topical BID   nebivolol  10 mg Oral Daily   pantoprazole  40 mg Oral Daily   polyethylene glycol  17 g Oral BID   rosuvastatin  20 mg Oral QHS   Continuous Infusions:  sodium chloride 75 mL/hr at 10/11/21 0955      LOS: 3 days   Charlynne Cousins  [facility]  10/11/2021, 10:15 AM

## 2021-10-12 DIAGNOSIS — N184 Chronic kidney disease, stage 4 (severe): Secondary | ICD-10-CM | POA: Diagnosis not present

## 2021-10-12 DIAGNOSIS — W19XXXA Unspecified fall, initial encounter: Secondary | ICD-10-CM | POA: Diagnosis not present

## 2021-10-12 DIAGNOSIS — T796XXA Traumatic ischemia of muscle, initial encounter: Secondary | ICD-10-CM | POA: Diagnosis not present

## 2021-10-12 DIAGNOSIS — N179 Acute kidney failure, unspecified: Secondary | ICD-10-CM | POA: Diagnosis not present

## 2021-10-12 LAB — BASIC METABOLIC PANEL
Anion gap: 9 (ref 5–15)
BUN: 77 mg/dL — ABNORMAL HIGH (ref 8–23)
CO2: 21 mmol/L — ABNORMAL LOW (ref 22–32)
Calcium: 8.1 mg/dL — ABNORMAL LOW (ref 8.9–10.3)
Chloride: 112 mmol/L — ABNORMAL HIGH (ref 98–111)
Creatinine, Ser: 4.4 mg/dL — ABNORMAL HIGH (ref 0.61–1.24)
GFR, Estimated: 13 mL/min — ABNORMAL LOW (ref >60)
Glucose, Bld: 133 mg/dL — ABNORMAL HIGH (ref 70–99)
Potassium: 4.5 mmol/L (ref 3.5–5.1)
Sodium: 142 mmol/L (ref 135–145)

## 2021-10-12 LAB — GLUCOSE, CAPILLARY
Glucose-Capillary: 119 mg/dL — ABNORMAL HIGH (ref 70–99)
Glucose-Capillary: 117 mg/dL — ABNORMAL HIGH (ref 70–99)
Glucose-Capillary: 133 mg/dL — ABNORMAL HIGH (ref 70–99)
Glucose-Capillary: 114 mg/dL — ABNORMAL HIGH (ref 70–99)

## 2021-10-12 MED ORDER — ASPIRIN 81 MG PO CHEW
81.0000 mg | CHEWABLE_TABLET | Freq: Every day | ORAL | Status: DC
  Administered 2021-10-12 – 2021-10-15 (×3): 81 mg via ORAL
  Filled 2021-10-12 (×3): qty 1

## 2021-10-12 MED ORDER — ISOSORBIDE DINITRATE 20 MG PO TABS
20.0000 mg | ORAL_TABLET | Freq: Three times a day (TID) | ORAL | Status: DC
  Administered 2021-10-12 – 2021-10-19 (×13): 20 mg via ORAL
  Filled 2021-10-12 (×18): qty 1

## 2021-10-12 MED ORDER — SODIUM POLYSTYRENE SULFONATE 15 GM/60ML PO SUSP
15.0000 g | Freq: Once | ORAL | Status: AC
  Administered 2021-10-12: 15 g via ORAL
  Filled 2021-10-12: qty 60

## 2021-10-12 MED ORDER — FENTANYL CITRATE PF 50 MCG/ML IJ SOSY
12.5000 ug | PREFILLED_SYRINGE | INTRAMUSCULAR | Status: AC | PRN
  Administered 2021-10-12 – 2021-10-13 (×3): 25 ug via INTRAVENOUS
  Filled 2021-10-12 (×3): qty 1

## 2021-10-12 MED ORDER — PANTOPRAZOLE SODIUM 40 MG IV SOLR
40.0000 mg | Freq: Every day | INTRAVENOUS | Status: DC
  Administered 2021-10-12 – 2021-10-19 (×7): 40 mg via INTRAVENOUS
  Filled 2021-10-12 (×7): qty 10

## 2021-10-12 NOTE — Care Management Important Message (Signed)
Important Message  Patient Details IM Letter placed in Patients room. Name: [name] MRN: [id number] Date of Birth: 03-02-1941   Medicare Important Message Given:  Yes     Kerin Salen 10/12/2021, 11:16 AM

## 2021-10-12 NOTE — Progress Notes (Signed)
[facility] PROGRESS NOTE    Progress Note  [name]  CVE:[id number] DOB: 24-Nov-1940 DOA: 10/08/2021 PCP: Burnard Bunting, MD     Brief Narrative:   [name] is an 81 y.o. 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, essential hypertension, prostate cancer with a history of  insertion of radiation seeds fell at home could not get up found by EMS in the ED was found to be in rhabdomyolysis, and an L2 anterior superior fracture, abdominal ultrasound showed absent right kidney unremarkable left an enlarging mass in the right lobe of the liver about 12 cm, MRI of the liver done that showed right hepatic lobe which is new with precontrast T1 hyperintense density which favors isolated melanoma    Assessment/Plan:   Fall at home, initial encounter/with fracture involving L2 vertebrae: Neurosurgery was curb sided who recommended DLCO. Follow-up with them as an outpatient. Physical therapy evaluated the patient recommended rehab. Relates his pain is controlled feels much better than yesterday.  Nontraumatic rhabdomyolysis: Improved, renal recommended to continue IV fluids  Acute kidney injury on chronic kidney disease stage IV: Baseline creatinine unclear back in April 2023 it was 4. He has been followed up as an outpatient on this as his creatinine has been slowly deteriorating. Creatinine this morning is 4. Renal has been consulted. Check an SPEP and a UPEP renal ultrasound last month showed 12.1 x 5.1 x 4.3 cm They suspect is likely due to rhabdomyolysis they recommended to continue IV fluids Allow permissive hypertension.  Hyperkalemia: Improved with Kayexalate we will give an additional dose.  Non-anion gap metabolic acidosis: Resolved with bicarbonate tablets likely due to renal dysfunction.  Essential hypertension: Continue  bisoprolol. Allow permissive hypertension.  MGUS: Follow-up by [facility], is currently on no active treatment.  Liver mass/abnormal LFT's: Abdominal ultrasound showed liver mass. MRI was done that showed a new liver mass concerning for malignancy. Alpha-fetoprotein 5.7 Hepatitis panel is negative. His hematologist oncologist knows about his hepatic mass they have a follow-up ointment to discuss if he would like to pursue any further evaluation and treatment. Talk to the patient that there is probably a malignancy and that he needs to follow-up with his own oncologist as an outpatient.  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 Was on Tarceva prior to admission. Sliding scale insulin blood glucose this morning is well controlled in the 80s.  Normocytic anemia: Likely related to MGUS. Iron 14 saturation 7 ferritin of 364. B12 greater than 7000.  Leukocytosis: Temperature is down, his leukocytosis is likely reactive.  Dysphagia: Has been eating his regular food for the past 2 to 3 days all of a sudden today he cannot swallow his pills this morning. Check a barium esophagus, consult speech.   DVT prophylaxis: lovenox Family Communication:wife Status is: Inpatient Remains inpatient appropriate because: Pain not controlled has not had a bowel movement now tolerating his diet.    Code Status:     Code Status Orders  (From admission, onward)           Start     Ordered   10/08/21 1541  Full code  Continuous        10/08/21 1540           Code Status History     Date Active Date Inactive Code Status Order ID Comments User  Context   11/29/2018 1804 12/02/2018 1435 DNR [id number]  Donne Hazel, MD Inpatient   10/22/2016 1206 10/23/2016 1407 Full Code [id number]  Adrian Prows, MD Inpatient   09/25/2016 1244 09/28/2016 1624 Full Code [id number]  Adrian Prows, MD ED      Advance Directive Documentation     Flowsheet Row Most Recent Value  Type of Advance Directive Healthcare Power of Attorney  Pre-existing out of facility DNR order (yellow form or pink MOST form) --  "MOST" Form in Place? --         IV Access:   Peripheral IV   Procedures and diagnostic studies:   No results found.   Medical Consultants:   None.   Subjective:    [name] relates he cannot swallow his pills this morning his pain is controlled.  Objective:    Vitals:   10/11/21 0437 10/11/21 1401 10/11/21 2200 10/12/21 0644  BP: 139/65 122/62 (!) 142/84 134/60  Pulse: 70 70 76 72  Resp: '16 19 18 17  '$ Temp: (!) 97.4 F (36.3 C) 98.1 F (36.7 C) 98 F (36.7 C) 97.7 F (36.5 C)  TempSrc: Oral Oral Oral Oral  SpO2: 100% 91% 93% 94%  Weight:      Height:       SpO2: 94 % O2 Flow Rate (L/min): 1 L/min   Intake/Output Summary (Last 24 hours) at 10/12/2021 0940 Last data filed at 10/12/2021 0653 Gross per 24 hour  Intake 470 ml  Output 1100 ml  Net -630 ml    Filed Weights   10/08/21 1036 10/08/21 1542  Weight: 81.6 kg 83.6 kg    Exam: General exam: In no acute distress. Respiratory system: Good air movement and clear to auscultation. Cardiovascular system: S1 & S2 heard, RRR. No JVD. Gastrointestinal system: Abdomen is nondistended, soft and nontender.  Extremities: No pedal edema. Skin: No rashes, lesions or ulcers Psychiatry: Judgement and insight appear normal. Mood & affect appropriate.   Data Reviewed:    Labs: Basic Metabolic Panel: Recent Labs  Lab 10/08/21 1115 10/09/21 0611 10/10/21 0610 10/11/21 0625 10/12/21 0524  NA 140 141 142 139 142  K 4.8 4.6 4.7 5.3* 4.5  CL 110 111 112* 108 112*  CO2 19* 22 22 19* 21*  GLUCOSE 260* 119* 81 111* 133*  BUN 48* 49* 63* 74* 77*  CREATININE 3.15* 3.00* 3.73* 4.22* 4.40*  CALCIUM 8.5* 8.1* 8.3* 8.3* 8.1*    GFR Estimated Creatinine Clearance: 14.2 mL/min (A) (by C-G formula based on SCr of 4.4 mg/dL  (H)). Liver Function Tests: Recent Labs  Lab 10/08/21 1115 10/09/21 0611 10/10/21 0610  AST 165* 118* 142*  ALT 84* 69* 82*  ALKPHOS 342* 299* 290*  BILITOT 1.2 1.1 1.3*  PROT 6.5 5.9* 5.8*  ALBUMIN 2.8* 2.5* 2.3*    No results for input(s): LIPASE, AMYLASE in the last 168 hours. Recent Labs  Lab 10/10/21 0610  AMMONIA 13    Coagulation profile No results for input(s): INR, PROTIME in the last 168 hours. COVID-19 Labs  Recent Labs    10/10/21 0610  FERRITIN 364*     Lab Results  Component Value Date   SARSCOV2NAA NEGATIVE 10/08/2021   [place] NEGATIVE 11/29/2018    CBC: Recent Labs  Lab 10/08/21 1115 10/09/21 0611 10/10/21 0610  WBC 15.6* 12.5* 13.1*  NEUTROABS 13.2*  --   --   HGB 11.2* 10.3* 9.8*  HCT 34.3* 32.9* 31.7*  MCV 86.4 87.7 88.5  PLT 127*  113* 122*    Cardiac Enzymes: Recent Labs  Lab 10/08/21 1115 10/09/21 0611 10/10/21 0610 10/11/21 0625  CKTOTAL 3,434* 1,267* 592* 327    BNP (last 3 results) No results for input(s): PROBNP in the last 8760 hours. CBG: Recent Labs  Lab 10/11/21 0717 10/11/21 1151 10/11/21 1637 10/11/21 2249 10/12/21 0840  GLUCAP 95 110* 184* 161* 119*    D-Dimer: No results for input(s): DDIMER in the last 72 hours. Hgb A1c: No results for input(s): HGBA1C in the last 72 hours.  Lipid Profile: No results for input(s): CHOL, HDL, LDLCALC, TRIG, CHOLHDL, LDLDIRECT in the last 72 hours. Thyroid function studies: No results for input(s): TSH, T4TOTAL, T3FREE, THYROIDAB in the last 72 hours.  Invalid input(s): FREET3 Anemia work up: Recent Labs    10/10/21 0610  VITAMINB12 >7,500*  FOLATE 8.9  FERRITIN 364*  TIBC 198*  IRON 14*  RETICCTPCT 0.8    Sepsis Labs: Recent Labs  Lab 10/08/21 1115 10/09/21 0611 10/10/21 0610  WBC 15.6* 12.5* 13.1*    Microbiology Recent Results (from the past 240 hour(s))  Resp Panel by RT-PCR (Flu A&B, Covid) Anterior Nasal Swab     Status: None    Collection Time: 10/08/21 11:09 AM   Specimen: Anterior Nasal Swab  Result Value Ref Range Status   SARS Coronavirus 2 by RT PCR NEGATIVE NEGATIVE Final    Comment: (NOTE) SARS-CoV-2 target nucleic acids are NOT DETECTED.  The SARS-CoV-2 RNA is generally detectable in upper respiratory specimens during the acute phase of infection. The lowest concentration of SARS-CoV-2 viral copies this assay can detect is 138 copies/mL. A negative result does not preclude SARS-Cov-2 infection and should not be used as the sole basis for treatment or other patient management decisions. A negative result may occur with  improper specimen collection/handling, submission of specimen other than nasopharyngeal swab, presence of viral mutation(s) within the areas targeted by this assay, and inadequate number of viral copies(<138 copies/mL). A negative result must be combined with clinical observations, patient history, and epidemiological information. The expected result is Negative.  Fact Sheet for Patients:  EntrepreneurPulse.com.au  Fact Sheet for Healthcare Providers:  IncredibleEmployment.be  This test is no t yet approved or cleared by the [place] FDA and  has been authorized for detection and/or diagnosis of SARS-CoV-2 by FDA under an Emergency Use Authorization (EUA). This EUA will remain  in effect (meaning this test can be used) for the duration of the COVID-19 declaration under Section 564(b)(1) of the Act, 21 U.S.C.section 360bbb-3(b)(1), unless the authorization is terminated  or revoked sooner.       Influenza A by PCR NEGATIVE NEGATIVE Final   Influenza B by PCR NEGATIVE NEGATIVE Final    Comment: (NOTE) The Xpert Xpress SARS-CoV-2/FLU/RSV plus assay is intended as an aid in the diagnosis of influenza from Nasopharyngeal swab specimens and should not be used as a sole basis for treatment. Nasal washings and aspirates are unacceptable for  Xpert Xpress SARS-CoV-2/FLU/RSV testing.  Fact Sheet for Patients: EntrepreneurPulse.com.au  Fact Sheet for Healthcare Providers: IncredibleEmployment.be  This test is not yet approved or cleared by the [place] FDA and has been authorized for detection and/or diagnosis of SARS-CoV-2 by FDA under an Emergency Use Authorization (EUA). This EUA will remain in effect (meaning this test can be used) for the duration of the COVID-19 declaration under Section 564(b)(1) of the Act, 21 U.S.C. section 360bbb-3(b)(1), unless the authorization is terminated or revoked.  Performed at [facility], [place] [address], [place], [place] [address]      Medications:    aspirin EC  81 mg Oral Daily   dorzolamide-timolol  1 drop Both Eyes BID   heparin injection (subcutaneous)  5,000 Units Subcutaneous Q8H   insulin aspart  0-15 Units Subcutaneous TID WC   isosorbide mononitrate  60 mg Oral Daily   latanoprost  1 drop Both Eyes QHS   mupirocin cream   Topical BID   nebivolol  10 mg Oral Daily   pantoprazole  40 mg Oral Daily   polyethylene glycol  17 g Oral BID   rosuvastatin  20 mg Oral QHS   sodium bicarbonate  650 mg Oral TID   sodium zirconium cyclosilicate  10 g Oral TID   Continuous Infusions:  sodium chloride 65 mL/hr at 10/11/21 2226      LOS: 4 days   Charlynne Cousins  [facility]  10/12/2021, 9:40 AM

## 2021-10-12 NOTE — Progress Notes (Signed)
Patient mentation has progressively gotten worse: His narcotics were held this morning. Renal evaluated the patient and he is more lethargic. We will go ahead and transfer the patient to [facility] for possible HD will communicate with the family. His vital is stable, place him n.p.o. saturations 90% blood pressure 140/76 with a heart rate of 68.

## 2021-10-12 NOTE — Progress Notes (Signed)
Reported called in to 5MW at [facility].  [facility] contacted.

## 2021-10-12 NOTE — Plan of Care (Signed)
  Problem: Activity: Goal: Risk for activity intolerance will decrease Outcome: Not Progressing   Problem: Nutrition: Goal: Adequate nutrition will be maintained Outcome: Not Progressing   Problem: Pain Managment: Goal: General experience of comfort will improve Outcome: Not Progressing

## 2021-10-12 NOTE — Evaluation (Signed)
Clinical/Bedside Swallow Evaluation Patient Details  Name: [name] MRN: [id number] Date of Birth: [birth year]  Today's Date: 10/12/2021 Time: SLP Start Time (ACUTE ONLY): 86 SLP Stop Time (ACUTE ONLY): 0539 SLP Time Calculation (min) (ACUTE ONLY): 36 min  Past Medical History:  Past Medical History:  Diagnosis Date   Arthritis    "little in my right hand; some in my right big toe" (10/22/2016)   Borderline diabetes    "tx'd w/RX; blood surgars dropped too low; dr. 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 at [facility] in [place] ([place]) 2006   s/p brachytherapy   Seborrheic keratosis    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   history of nephrectomy in 1984   Past Surgical History:  Past Surgical History:  Procedure Laterality Date   [place]  09/26/2016; 10/22/2016   "4 stents; 2 stents"   CORONARY STENT INTERVENTION Right 09/26/2016   Procedure: Coronary Stent Intervention;  Surgeon: Adrian Prows, MD;  Location: [place] [facility];  Service: Cardiovascular;  Laterality: Right;   CORONARY STENT INTERVENTION N/A 10/22/2016   Procedure: Coronary Stent Intervention;  Surgeon: Adrian Prows, MD;  Location: [place] [facility];  Service: Cardiovascular;  Laterality: N/A;   CYSTOSCOPY/URETEROSCOPY/HOLMIUM LASER/STENT PLACEMENT Left 11/29/2018   Procedure: CYSTOSCOPY/URETEROSCOPY/HOLMIUM LASER/STENT PLACEMENTleft retrograde pylegram;  Surgeon: Ardis Hughs, MD;   Location: [facility];  Service: Urology;  Laterality: Left;   HOLMIUM LASER APPLICATION  7/67/3419   Procedure: HOLMIUM LASER APPLICATION;  Surgeon: Ardis Hughs, MD;  Location: [facility];  Service: Urology;;   INSERTION PROSTATE RADIATION SEED  2009?   LEFT HEART CATH AND CORONARY ANGIOGRAPHY N/A 09/26/2016   Procedure: Left Heart Cath and Coronary Angiography;  Surgeon: Adrian Prows, MD;  Location: [place] [facility];  Service: Cardiovascular;  Laterality: N/A;   NEPHRECTOMY Right [place]  04/2016   "tore all the ligaments; had to put 3 pins in to reattach; Dr. Gladstone Lighter"   TONSILLECTOMY     HPI:  [name] is an 81 y.o. male past medical history significant for MGUS, chronic kidney disease stage IV status post right nephrectomy, malignant tumor of the right kidney, essential hypertension, prostate cancer with a history of  insertion of radiation seeds admittied after falling at home and found to be in rhabdomyolysis, and an L2 anterior superior fracture, abdominal ultrasound showed absent right kidney unremarkable left an enlarging mass in the right lobe of the liver. Per chart MRI of the liver done that showed right hepatic lobe which is new with precontrast T1 hyperintense density which favors isolated melanoma. Pt had difficulty swallowing his pills this am.    Assessment / Plan / Recommendation  Clinical Impression  Family report pocketing last evening during dinner and difficulty with pills this morning. Presently pt is at a moderate-high opportunity to aspirate. He is lethargic, will wake  when stimulated, in significant pain with head of bed somewhat limited (reverse Trendelenburg utilized) and head in moderately extended to the right position. Able to minimally protrude tongue, no cough on command. He was able to accept applesauce with functional seal on spoon and suspected delayed swallow x 3 trials. No labial protrusion around straw and minimal amount  siphoned with delayed response. Educated family re: current dysphagia from multifactoral sources and high risk of attempting po's. He has had recent pain meds. Recommend ONLY attempt downgraded puree texture, thin liquids when alert, able to sit upright and focus on eating/drinking. RN to see about changing meds to IV. If needed, can crush in applesauce. Family repeated back understanding. ST will follow. SLP Visit Diagnosis: Dysphagia, unspecified (R13.10)    Aspiration Risk  Moderate aspiration risk    Diet Recommendation Dysphagia 1 (Puree);Thin liquid   Liquid Administration via: Cup Medication Administration: Other (Comment) (IV preferred, crush in puree) Supervision: Full supervision/cueing for compensatory strategies Compensations: Slow rate;Small sips/bites;Minimize environmental distractions (ONLY attempt when adequately awake) Postural Changes: Seated upright at 90 degrees    Other  Recommendations Oral Care Recommendations: Oral care BID    Recommendations for follow up therapy are one component of a multi-disciplinary discharge planning process, led by the attending physician.  Recommendations may be updated based on patient status, additional functional criteria and insurance authorization.  Follow up Recommendations Other (comment) (TBD)      Assistance Recommended at Discharge  (TBD)  Functional Status Assessment Patient has had a recent decline in their functional status and demonstrates the ability to make significant improvements in function in a reasonable and predictable amount of time.  Frequency and Duration min 2x/week  2 weeks       Prognosis Prognosis for Safe Diet Advancement:  (fair-good) Barriers to Reach Goals: Cognitive deficits      Swallow Study   General Date of Onset: 10/08/21 HPI: [name] is an 81 y.o. male past medical history significant for MGUS, chronic kidney disease stage IV status post right nephrectomy, malignant tumor of the  right kidney, essential hypertension, prostate cancer with a history of  insertion of radiation seeds admittied after falling at home and found to be in rhabdomyolysis, and an L2 anterior superior fracture, abdominal ultrasound showed absent right kidney unremarkable left an enlarging mass in the right lobe of the liver. Per chart MRI of the liver done that showed right hepatic lobe which is new with precontrast T1 hyperintense density which favors isolated melanoma. Pt had difficulty swallowing his pills this am. Type of Study: Bedside Swallow Evaluation Previous Swallow Assessment:  (none) Diet Prior to this Study: Regular;Thin liquids Temperature Spikes Noted: No Respiratory Status: Room air History of Recent Intubation: No Behavior/Cognition: Requires cueing;Uncooperative;Confused;Lethargic/Drowsy;Distractible Oral Cavity Assessment: Dry Oral Care Completed by SLP: No Oral Cavity - Dentition: Adequate natural dentition Vision:  (kept eyes closed) Self-Feeding Abilities: Total assist Patient Positioning: Upright in bed (head in extension and to the side) Baseline Vocal Quality: Normal Volitional Cough: Cognitively unable to elicit Volitional Swallow: Unable to elicit    Oral/Motor/Sensory Function Overall Oral Motor/Sensory Function: Generalized oral weakness   Ice Chips Ice chips: Not tested   Thin Liquid Thin Liquid: Impaired Presentation:  (siphoned with straw) Oral Phase Impairments: Reduced labial seal;Reduced lingual movement/coordination;Poor awareness of bolus Pharyngeal  Phase Impairments: Suspected delayed Swallow    Nectar Thick Nectar Thick Liquid: Not tested   Honey Thick Honey Thick Liquid: Not tested   Puree Puree: Impaired Oral  Phase Functional Implications: Prolonged oral transit Pharyngeal Phase Impairments: Suspected delayed Swallow   Solid     Solid: Not tested      Houston Siren 10/12/2021,11:56 AM

## 2021-10-12 NOTE — Progress Notes (Signed)
[facility] Progress Note  Subjective: seen in room, had dilaudid at 6 am, nothing since then. Very lethargic, won't answer orientation questions.   Vitals:   10/11/21 0437 10/11/21 1401 10/11/21 2200 10/12/21 0644  BP: 139/65 122/62 (!) 142/84 134/60  Pulse: 70 70 76 72  Resp: '16 19 18 17  ' Temp: (!) 97.4 F (36.3 C) 98.1 F (36.7 C) 98 F (36.7 C) 97.7 F (36.5 C)  TempSrc: Oral Oral Oral Oral  SpO2: 100% 91% 93% 94%  Weight:      Height:        Exam: Gen lethargic, not answering orientation questions Chornically ill appearing No jvd or bruits Chest clear bilat to bases RRR no RG Abd soft ntnd no mass or ascites +bs Ext trace- 1+ UE edema, no LE edema Neuro is alert, Ox 2        Abd [place] 5/29 - Right Kidney: Surgically absent. Left Kidney: 10.4 cm. Echogenicity within normal limits. No mass or hydronephrosis visualized.    MR liver noncon - Hepatobiliary: No evidence of cirrhosis. Dominant right hepatic lobe mass measures 10.5 x 12.5 x 12.7 cm on 21/5 and 21/3. Demonstrates heterogeneous primarily hyperintense T2 signal. Areas of precontrast T1 hyperintensity throughout including on series 12.    Home meds include - norvasc 2.5, asa, bystolic 01XB qd, cosopt, isosorbide mononitrate, nitroglycerin sl, pantoprazole, rosuvastatin, tresiba insulin, finerenone 10 qd        Date                          Creat               eGFR    2011                         1.48- 1.59    2012- 2014               1.54- 2.00    2018                         1.59- 1.98        31- 40 ml/min    2020                         5.92 >> 2.10    AKI episode, 8 -> 21 ml/min    August 13, 2021             4.22                 13                                                10/08/21                     3.15                 19    10/09/21                     3.00                 20    10/10/21  3.73                 15    10/11/21                     4.22                 13       BP's here 120-150 SB     alb 2.3   Tprot 5.8        CPK 3234 > 1267 > 592 > 327 today     UA  5/29 - large Hb w/ 0-5 rbc => myoglobin,  100 prot, 0-5  wbc, rare bacteria     CXR 5/29 - IMPRESSION: No acute cardiopulmonary process.     Assessment/ Plan: AKI on CKD IV - b/l creat 3.1- 3.7, eGFR 15-20.  Creat 3.1 on admission 5/29, now up to 4.4 today.  Pt f/b Dr Candiss Norse at [facility], was just seen last week. Abd US shows absent R kidney and normal appearing L kidney w/o obstruction. UA showed myoglobin (large Hb w/ min rbc's) and w/ CPK > 5000 suspect rhabdomyolysis w/ mild ATN is cause of his AKI.  Cont low rate IVF's at 65 cc/hr. He is very lethargic which could be uremia or narcotic effects. Narcotics have been dc'd. Recommend transfer to [facility] tonight in case needs HD, and hold all/ any sedating meds for next few days. Will consult IR for temp cath in am.  SP fall - at home, here w/ mild rhabdomyolysis (pt found down) Hx of MGUS - ordered serum free LC's to SPEP/ UPEP already ordered; however, with his low total protein lab, a full blown myeloma is unlikely. HTN - getting BB and norvasc, BP's are good. Avoid hypotension, keep SBP > 115-120 please Hyperkalemia - resolved, cont renal diet Volume - mild edema of the UE's. Follow.  MGUS - as above Liver mass - per pmd DM2         Rob Walda Hertzog 10/12/2021, 7:42 AM   Recent Labs  Lab 10/09/21 0611 10/10/21 0610 10/11/21 0625 10/12/21 0524  HGB 10.3* 9.8*  --   --   ALBUMIN 2.5* 2.3*  --   --   CALCIUM 8.1* 8.3* 8.3* 8.1*  CREATININE 3.00* 3.73* 4.22* 4.40*  K 4.6 4.7 5.3* 4.5   Inpatient medications:  aspirin EC  81 mg Oral Daily   dorzolamide-timolol  1 drop Both Eyes BID   heparin injection (subcutaneous)  5,000 Units Subcutaneous Q8H   insulin aspart  0-15 Units Subcutaneous TID WC   isosorbide mononitrate  60 mg Oral Daily   latanoprost  1 drop Both Eyes QHS   mupirocin cream   Topical BID   nebivolol  10 mg Oral Daily    pantoprazole  40 mg Oral Daily   polyethylene glycol  17 g Oral BID   rosuvastatin  20 mg Oral QHS   sodium bicarbonate  650 mg Oral TID   sodium zirconium cyclosilicate  10 g Oral TID    sodium chloride 65 mL/hr at 10/11/21 2226   hydrALAZINE, HYDROmorphone (DILAUDID) injection, lip balm, oxyCODONE-acetaminophen, prochlorperazine

## 2021-10-12 NOTE — Progress Notes (Signed)
OT Cancellation Note  Patient Details Name: [name] MRN: [id number] DOB: 1940/07/11   Cancelled Treatment:    Reason Eval/Treat Not Completed: Pain limiting ability to participate. Patient confused. Patient in pain with any touching or movement at this time. Patient has no pain medications ton MAR. Do not expect patient can tolerate therapy. Will hold for now until patient can be medicated in preparation for therapy.  [name] L [name] 10/12/2021, 2:31 PM

## 2021-10-12 NOTE — Plan of Care (Signed)
  Problem: Safety: Goal: Ability to remain free from injury will improve Outcome: Progressing   

## 2021-10-12 NOTE — Progress Notes (Signed)
Patient with L2 fracture is complaining of severe pain. He was somnolent earlier after Dilaudid but is alert now. Will give a small dose of fentanyl.

## 2021-10-13 LAB — GLUCOSE, CAPILLARY
Glucose-Capillary: 115 mg/dL — ABNORMAL HIGH (ref 70–99)
Glucose-Capillary: 126 mg/dL — ABNORMAL HIGH (ref 70–99)
Glucose-Capillary: 170 mg/dL — ABNORMAL HIGH (ref 70–99)

## 2021-10-13 LAB — BASIC METABOLIC PANEL
Anion gap: 13 (ref 5–15)
BUN: 75 mg/dL — ABNORMAL HIGH (ref 8–23)
CO2: 18 mmol/L — ABNORMAL LOW (ref 22–32)
Calcium: 8.1 mg/dL — ABNORMAL LOW (ref 8.9–10.3)
Chloride: 113 mmol/L — ABNORMAL HIGH (ref 98–111)
Creatinine, Ser: 4.1 mg/dL — ABNORMAL HIGH (ref 0.61–1.24)
GFR, Estimated: 14 mL/min — ABNORMAL LOW (ref >60)
Glucose, Bld: 134 mg/dL — ABNORMAL HIGH (ref 70–99)
Potassium: 3.8 mmol/L (ref 3.5–5.1)
Sodium: 144 mmol/L (ref 135–145)

## 2021-10-13 LAB — HEPATITIS B SURFACE ANTIBODY,QUALITATIVE: Hep B S Ab: NONREACTIVE

## 2021-10-13 MED ORDER — CHLORHEXIDINE GLUCONATE CLOTH 2 % EX PADS
6.0000 | MEDICATED_PAD | Freq: Every day | CUTANEOUS | Status: DC
  Administered 2021-10-14 – 2021-10-19 (×5): 6 via TOPICAL

## 2021-10-13 MED ORDER — SODIUM CHLORIDE 0.9 % IV SOLN
250.0000 mg | Freq: Every day | INTRAVENOUS | Status: AC
  Administered 2021-10-13 – 2021-10-14 (×2): 250 mg via INTRAVENOUS
  Filled 2021-10-13 (×2): qty 20

## 2021-10-13 MED ORDER — DEXTROSE-NACL 5-0.9 % IV SOLN: INTRAVENOUS | Status: DC

## 2021-10-13 MED ORDER — LIDOCAINE 5 % EX PTCH
1.0000 | MEDICATED_PATCH | CUTANEOUS | Status: DC
Start: 2021-10-13 — End: 2021-10-23
  Administered 2021-10-13 – 2021-10-22 (×7): 1 via TRANSDERMAL
  Filled 2021-10-13 (×7): qty 1

## 2021-10-13 NOTE — Progress Notes (Signed)
Speech Language Pathology Treatment: Dysphagia  Patient Details Name: [name] MRN: [id number] DOB: 1940-09-18 Today's Date: 10/13/2021 Time: 0258-5277 SLP Time Calculation (min) (ACUTE ONLY): 28 min  Assessment / Plan / Recommendation Clinical Impression  [name] was transferred from [facility] to [facility] last night.  He was made NPO due to mental status changes.  Family at bedside. Oral suctioning equipment was set up and oral care provided.  Pt was positioned in reverse trendelenburg given severity of back pain when efforts were made to raise HOB.  In this position, he was more comfortable and able to participate. He required mod verbal cues to open eyes/attend- when doing so, he was able to maintain focus, chew ice and consume sips of water from a straw with improving function as session progressed.  He politely declined any other POs (purees).  For now, continue NPO but allow ice chips and sips of water for pleasure and to help maintain integrity of oral mucosa, along with oral hygiene QID. D/W RN and family. Will follow for readiness to resume an oral diet.   HPI HPI: [name] is an 81 y.o. 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 Per chart MRI of the liver done that showed right hepatic lobe which is new with precontrast T1 hyperintense density which favors isolated melanoma. Pt had difficulty swallowing his pills morning of 6/2 and swallow eval ordered.      SLP Plan  Continue with current plan of care      Recommendations for follow up therapy are one component of a  multi-disciplinary discharge planning process, led by the attending physician.  Recommendations may be updated based on patient status, additional functional criteria and insurance authorization.    Recommendations  Diet recommendations: NPO (sips of water and ice chips) Liquids provided via: Teaspoon Compensations: Minimize environmental distractions                Oral Care Recommendations: Oral care QID Follow Up Recommendations: No SLP follow up SLP Visit Diagnosis: Dysphagia, unspecified (R13.10) Plan: Continue with current plan of care         Kortni Hasten L. Tivis Ringer, [place] CCC/SLP Acute Rehabilitation Services Office number [phone] Pager [phone]   Assunta Curtis  10/13/2021, 4:36 PM

## 2021-10-13 NOTE — Progress Notes (Addendum)
PROGRESS NOTE  [name]  ZOX:[id number] DOB: [birth year] DOA: 10/08/2021 PCP: Burnard Bunting, MD   Brief Narrative:  Patient is a 81 year old male with history of MGUS, CKD stage IV,nephrectomy for malignant tumor of right kidney in 1996, hypertension, prostate cancer, was brought to the emergency department from home after a fall.  Work-up in the emergency department showed rhabdomyolysis, L2 anterior superior fracture.  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  Hospital course remarkable for progressive worsening of mental status, declining kidney function.  Patient transferred from [facility] to [facility] for consideration of dialysis.  Nephrology following  Assessment & Plan:  Principal Problem:   Fall at home, initial encounter Active Problems:   Coronary artery disease of native artery of native heart with stable angina pectoris (HCC)   MGUS (monoclonal gammopathy of unknown significance)   Primary hypertension   CKD (chronic kidney disease) stage 4, GFR 15-29 ml/min (HCC)   HLD (hyperlipidemia)   AKI (acute kidney injury) ([place])   Type 2 diabetes mellitus with hyperlipidemia (HCC)   Closed fracture of lumbar spine without lesion of spinal cord (HCC)   GERD (gastroesophageal reflux disease)   Elevated LFTs   Rhabdomyolysis   Pressure injury of skin   Normal anion gap metabolic acidosis   Fall/L2 vertebral fracture: Fell at home.  Imaging showed  L2 anterior superior fracture.  Neurosurgery was consulted.  Recommended TLSO brace and recommended outpatient follow-up.  Physical therapy saw the patient and recommended skilled nursing facility on discharge.   AKI on CKD stage IV/rhabdomyolysis/hyperkalemia/non-anion gap metabolic acidosis:   Creatinine was in the range of 4 on April 2023.  On admission, creatinine was 3.1.  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  Follows with nephrology.  He started on IV fluids for  rhabdomyolysis.  Creatinine has been in the range of 4.  AKI suspected to be from rhabdomyolysis associated  ATN.  Transferred to [facility] for consideration of dialysis.  Continue sodium bicarb tablets.Hyperkalemia resolved with Kayexalate.  Nephrology holding for plan of dialysis because of slight improvement in the kidney function today.  Altered mental status: Secondary to metabolic encephalopathy from from uremia or narcotics.  Continue to monitor.  Minimize narcotics, sedatives.  Delirium precautions.  He is currently oriented to place.He has been having poor oral intake  Hypertension: Continue bisoprolol,imdur.  Monitor blood pressure  Liver mass/abnormal LFTs: Abdominal ultrasound showed right hepatic lobe mass about 12 cm.  MRI confirmed right hepatic lobe mass suspicious for isolated melanoma metastasis.  Elevated alpha-fetoprotein.  Hepatitis panel negative.  His oncologist is aware about this finding and he has been set up an appointment as an outpatient.  Diabetes type 2: Recent hemoglobin A1c of 8.8.  On Tarceva prior to admission.  Currently on sliding scale insulin.  Normocytic anemia/iron deficiency: Secondary to CKD, MGUS.  Iron studies on 5/31 showed iron of 14.  Given IV iron.  Continue to monitor hemoglobin  Leukocytosis: Likely reactive.  Continue to monitor  Dysphagia: Speech therapy following, recommended dysphagia 1 diet.  Debility/deconditioning: Patient seen by PT/OT and recommended skilled nursing facility on discharge        Pressure Injury 10/10/21 Head Posterior Stage 2 -  Partial thickness loss of dermis presenting as a shallow open injury with a red, pink wound bed without slough. (Active)  10/10/21   Location: Head  Location Orientation: Posterior  Staging: Stage 2 -  Partial thickness loss of dermis presenting as a shallow  open injury with a red, pink wound bed without slough.  Wound Description (Comments):   Present on Admission: Yes  Dressing Type Other  (Comment) 10/11/21 2230    DVT prophylaxis:heparin injection 5,000 Units Start: 10/10/21 1400 SCDs Start: 10/08/21 1541     Code Status: Full Code  Family Communication: Brother at the bedside  Patient status:Inpatient  Patient is from :Home  Anticipated discharge to: Skilled nursing facility  Estimated DC date: Not sure, waiting improvement in the renal function, mental status   Consultants:   Procedures:  Antimicrobials:  Anti-infectives (From admission, onward)    None       Subjective:  Patient seen and examined at the bedside this morning.  Brother at the bedside.  Very deconditioned elderly male lying on bed.  Confused but oriented to place.  Did not participate in meaningful conversation.  Objective: Vitals:   10/12/21 1406 10/12/21 2106 10/13/21 0049 10/13/21 0527  BP: 140/76 (!) 167/75 (!) 158/69 (!) 151/68  Pulse: 68 76 77 76  Resp: 18 18 (!) 25 (!) 24  Temp: 99 F (37.2 C) 98.5 F (36.9 C) 98.4 F (36.9 C) 98.3 F (36.8 C)  TempSrc: Oral  Oral Oral  SpO2: 94% 97% 100% 100%  Weight:      Height:        Intake/Output Summary (Last 24 hours) at 10/13/2021 0756 Last data filed at 10/13/2021 0600 Gross per 24 hour  Intake 1746.17 ml  Output 1475 ml  Net 271.17 ml   Filed Weights   10/08/21 1036 10/08/21 1542  Weight: 81.6 kg 83.6 kg    Examination:  General exam: Obese, deconditioned, chronically ill looking HEENT: PERRL Respiratory system:  no wheezes or crackles  Cardiovascular system: S1 & S2 heard, RRR.  Gastrointestinal system: Abdomen is nondistended, soft and nontender. Central nervous system: Awake but not oriented to time. Extremities: Trace edema on the left upper extremity, no clubbing ,no cyanosis.TLSO brace Skin: No rashes, no ulcers,no icterus     Data Reviewed: I have personally reviewed following labs and imaging studies  CBC: Recent Labs  Lab 10/08/21 1115 10/09/21 0611 10/10/21 0610  WBC 15.6* 12.5* 13.1*   NEUTROABS 13.2*  --   --   HGB 11.2* 10.3* 9.8*  HCT 34.3* 32.9* 31.7*  MCV 86.4 87.7 88.5  PLT 127* 113* 518*   Basic Metabolic Panel: Recent Labs  Lab 10/08/21 1115 10/09/21 0611 10/10/21 0610 10/11/21 0625 10/12/21 0524  NA 140 141 142 139 142  K 4.8 4.6 4.7 5.3* 4.5  CL 110 111 112* 108 112*  CO2 19* 22 22 19* 21*  GLUCOSE 260* 119* 81 111* 133*  BUN 48* 49* 63* 74* 77*  CREATININE 3.15* 3.00* 3.73* 4.22* 4.40*  CALCIUM 8.5* 8.1* 8.3* 8.3* 8.1*     Recent Results (from the past 240 hour(s))  Resp Panel by RT-PCR (Flu A&B, Covid) Anterior Nasal Swab     Status: None   Collection Time: 10/08/21 11:09 AM   Specimen: Anterior Nasal Swab  Result Value Ref Range Status   SARS Coronavirus 2 by RT PCR NEGATIVE NEGATIVE Final    Comment: (NOTE) SARS-CoV-2 target nucleic acids are NOT DETECTED.  The SARS-CoV-2 RNA is generally detectable in upper respiratory specimens during the acute phase of infection. The lowest concentration of SARS-CoV-2 viral copies this assay can detect is 138 copies/mL. A negative result does not preclude SARS-Cov-2 infection and should not be used as the sole basis for treatment or other patient  management decisions. A negative result may occur with  improper specimen collection/handling, submission of specimen other than nasopharyngeal swab, presence of viral mutation(s) within the areas targeted by this assay, and inadequate number of viral copies(<138 copies/mL). A negative result must be combined with clinical observations, patient history, and epidemiological information. The expected result is Negative.  Fact Sheet for Patients:  EntrepreneurPulse.com.au  Fact Sheet for Healthcare Providers:  IncredibleEmployment.be  This test is no t yet approved or cleared by the [place] FDA and  has been authorized for detection and/or diagnosis of SARS-CoV-2 by FDA under an Emergency Use Authorization  (EUA). This EUA will remain  in effect (meaning this test can be used) for the duration of the COVID-19 declaration under Section 564(b)(1) of the Act, 21 U.S.C.section 360bbb-3(b)(1), unless the authorization is terminated  or revoked sooner.       Influenza A by PCR NEGATIVE NEGATIVE Final   Influenza B by PCR NEGATIVE NEGATIVE Final    Comment: (NOTE) The Xpert Xpress SARS-CoV-2/FLU/RSV plus assay is intended as an aid in the diagnosis of influenza from Nasopharyngeal swab specimens and should not be used as a sole basis for treatment. Nasal washings and aspirates are unacceptable for Xpert Xpress SARS-CoV-2/FLU/RSV testing.  Fact Sheet for Patients: EntrepreneurPulse.com.au  Fact Sheet for Healthcare Providers: IncredibleEmployment.be  This test is not yet approved or cleared by the [place] FDA and has been authorized for detection and/or diagnosis of SARS-CoV-2 by FDA under an Emergency Use Authorization (EUA). This EUA will remain in effect (meaning this test can be used) for the duration of the COVID-19 declaration under Section 564(b)(1) of the Act, 21 U.S.C. section 360bbb-3(b)(1), unless the authorization is terminated or revoked.  Performed at [facility] [place] [address], [place], [place] [address]      Radiology Studies: No results found.  Scheduled Meds:  aspirin  81 mg Oral Daily   dorzolamide-timolol  1 drop Both Eyes BID   heparin injection (subcutaneous)  5,000 Units Subcutaneous Q8H   insulin aspart  0-15 Units Subcutaneous TID WC   isosorbide dinitrate  20 mg Oral TID   latanoprost  1 drop Both Eyes QHS   mupirocin cream   Topical BID   nebivolol  10 mg Oral Daily   pantoprazole (PROTONIX) IV  40 mg Intravenous Daily   rosuvastatin  20 mg Oral QHS   Continuous Infusions:  sodium chloride 65 mL/hr at 10/12/21 1411     LOS: 5 days   Shelly Coss, MD [facility] P6/07/2021,  7:56 AM

## 2021-10-13 NOTE — Progress Notes (Addendum)
[name] Progress Note  Subjective: seen in room, remained Ox 2 (hospital, self).   Vitals:   10/12/21 2106 10/13/21 0049 10/13/21 0527 10/13/21 0918  BP: (!) 167/75 (!) 158/69 (!) 151/68 (!) 145/60  Pulse: 76 77 76 73  Resp: 18 (!) 25 (!) 24 16  Temp: 98.5 F (36.9 C) 98.4 F (36.9 C) 98.3 F (36.8 C) (!) 97.4 F (36.3 C)  TempSrc:  Oral Oral Oral  SpO2: 97% 100% 100% 96%  Weight:      Height:        Exam: Gen lethargic, Ox 2 Chornically ill appearing No jvd or bruits Chest clear bilat to bases RRR no RG Abd soft ntnd no mass or ascites +bs Ext trace- 1+ UE edema, no LE edema Neuro is alert, Ox 2        Abd [place] 5/29 - Right Kidney: Surgically absent. Left Kidney: 10.4 cm. Echogenicity within normal limits. No mass or hydronephrosis visualized.    MR liver noncon - Hepatobiliary: No evidence of cirrhosis. Dominant right hepatic lobe mass measures 10.5 x 12.5 x 12.7 cm on 21/5 and 21/3. Demonstrates heterogeneous primarily hyperintense T2 signal. Areas of precontrast T1 hyperintensity throughout including on series 12.    Home meds include - norvasc 2.5, asa, bystolic 35AP qd, cosopt, isosorbide mononitrate, nitroglycerin sl, pantoprazole, rosuvastatin, tresiba insulin, finerenone 10 qd        Date                          Creat               eGFR    2011                         1.48- 1.59    2012- 2014               1.54- 2.00    2018                         1.59- 1.98        31- 40 ml/min    2020                         5.92 >> 2.10    AKI episode, 8 -> 21 ml/min    August 13, 2021             4.22                 13                                                10/08/21                     3.15                 19    10/09/21                     3.00                 20    10/10/21  3.73                 15    10/11/21                     4.22                 13      BP's here 120-150 SB     alb 2.3   Tprot 5.8        CPK 3234 > 1267 >  592 > 327 today     UA  5/29 - large Hb w/ 0-5 rbc => myoglobin,  100 prot, 0-5  wbc, rare bacteria     CXR 5/29 - IMPRESSION: No acute cardiopulmonary process.     Assessment/ Plan: AKI on CKD IV - b/l creat 3.1- 3.7, eGFR 15-20.  Creat 3.1 on admission 5/29, now up to 4- 5 range.  Pt f/b Dr Candiss Norse at [facility], was just seen last week. Abd [place] > absent R kidney, L kidney w/o obstruction. UA showed myoglobin. Suspect rhabdomyolysis-related ATN as cause of AKI. Pt remains lethargic/ confused today but creat is down first time, and he did get early am dose of fentanyl overnight. No need for HD today, f/u lab in am  Cont IVF's at 65 ml/min.  SP fall / L2 fracture - found down at home  Hx of MGUS - ordered serum free LC's to SPEP/ UPEP, all are pending. HTN - getting BB and norvasc, BP's good. Avoid hypotension, keep SBP > 115-120 please w/ AKI Volume - no sig pitting edema, CXR 5/29 neg. Voiding a lot per RN into the bed sheets.  MGUS - as above Liver mass - per pmd DM2         Rob Nija Koopman 10/13/2021, 10:16 AM   Recent Labs  Lab 10/09/21 0611 10/10/21 0610 10/11/21 0625 10/12/21 0524  HGB 10.3* 9.8*  --   --   ALBUMIN 2.5* 2.3*  --   --   CALCIUM 8.1* 8.3* 8.3* 8.1*  CREATININE 3.00* 3.73* 4.22* 4.40*  K 4.6 4.7 5.3* 4.5    Inpatient medications:  aspirin  81 mg Oral Daily   dorzolamide-timolol  1 drop Both Eyes BID   heparin injection (subcutaneous)  5,000 Units Subcutaneous Q8H   insulin aspart  0-15 Units Subcutaneous TID WC   isosorbide dinitrate  20 mg Oral TID   latanoprost  1 drop Both Eyes QHS   lidocaine  1 patch Transdermal Q24H   mupirocin cream   Topical BID   nebivolol  10 mg Oral Daily   pantoprazole (PROTONIX) IV  40 mg Intravenous Daily   rosuvastatin  20 mg Oral QHS    dextrose 5 % and 0.9% NaCl     ferric gluconate (FERRLECIT) IVPB     fentaNYL (SUBLIMAZE) injection, lip balm, prochlorperazine

## 2021-10-14 ENCOUNTER — Inpatient Hospital Stay (HOSPITAL_COMMUNITY): Payer: Medicare Other

## 2021-10-14 DIAGNOSIS — Z515 Encounter for palliative care: Secondary | ICD-10-CM

## 2021-10-14 DIAGNOSIS — S32028A Other fracture of second lumbar vertebra, initial encounter for closed fracture: Secondary | ICD-10-CM

## 2021-10-14 DIAGNOSIS — Z7189 Other specified counseling: Secondary | ICD-10-CM

## 2021-10-14 LAB — GLUCOSE, CAPILLARY
Glucose-Capillary: 138 mg/dL — ABNORMAL HIGH (ref 70–99)
Glucose-Capillary: 185 mg/dL — ABNORMAL HIGH (ref 70–99)
Glucose-Capillary: 204 mg/dL — ABNORMAL HIGH (ref 70–99)
Glucose-Capillary: 212 mg/dL — ABNORMAL HIGH (ref 70–99)
Glucose-Capillary: 219 mg/dL — ABNORMAL HIGH (ref 70–99)
Glucose-Capillary: 236 mg/dL — ABNORMAL HIGH (ref 70–99)

## 2021-10-14 LAB — COMPREHENSIVE METABOLIC PANEL
ALT: 73 U/L — ABNORMAL HIGH (ref 0–44)
AST: 145 U/L — ABNORMAL HIGH (ref 15–41)
Albumin: 1.6 g/dL — ABNORMAL LOW (ref 3.5–5.0)
Alkaline Phosphatase: 603 U/L — ABNORMAL HIGH (ref 38–126)
Anion gap: 10 (ref 5–15)
BUN: 73 mg/dL — ABNORMAL HIGH (ref 8–23)
CO2: 20 mmol/L — ABNORMAL LOW (ref 22–32)
Calcium: 8.1 mg/dL — ABNORMAL LOW (ref 8.9–10.3)
Chloride: 118 mmol/L — ABNORMAL HIGH (ref 98–111)
Creatinine, Ser: 4 mg/dL — ABNORMAL HIGH (ref 0.61–1.24)
GFR, Estimated: 14 mL/min — ABNORMAL LOW (ref >60)
Glucose, Bld: 233 mg/dL — ABNORMAL HIGH (ref 70–99)
Potassium: 3.7 mmol/L (ref 3.5–5.1)
Sodium: 148 mmol/L — ABNORMAL HIGH (ref 135–145)
Total Bilirubin: 1.2 mg/dL (ref 0.3–1.2)
Total Protein: 5.4 g/dL — ABNORMAL LOW (ref 6.5–8.1)

## 2021-10-14 LAB — CBC
HCT: 28.6 % — ABNORMAL LOW (ref 39.0–52.0)
Hemoglobin: 9.5 g/dL — ABNORMAL LOW (ref 13.0–17.0)
MCH: 27.2 pg (ref 26.0–34.0)
MCHC: 33.2 g/dL (ref 30.0–36.0)
MCV: 81.9 fL (ref 80.0–100.0)
Platelets: 215 10*3/uL (ref 150–400)
RBC: 3.49 MIL/uL — ABNORMAL LOW (ref 4.22–5.81)
RDW: 16.6 % — ABNORMAL HIGH (ref 11.5–15.5)
WBC: 14.7 10*3/uL — ABNORMAL HIGH (ref 4.0–10.5)
nRBC: 0 % (ref 0.0–0.2)

## 2021-10-14 LAB — HEPATITIS B SURFACE ANTIBODY, QUANTITATIVE: Hep B S AB Quant (Post): <3.1 m[IU]/mL — ABNORMAL LOW (ref >9.9)

## 2021-10-14 IMAGING — CR DG LUMBAR SPINE 2-3V
3 series · 4 of 4 positions shown · non-contrast
Comparison: CT [DATE]

CLINICAL DATA: Fall, weakness

EXAM:
LUMBAR SPINE - 2-3 VIEW

[l-spine ap]
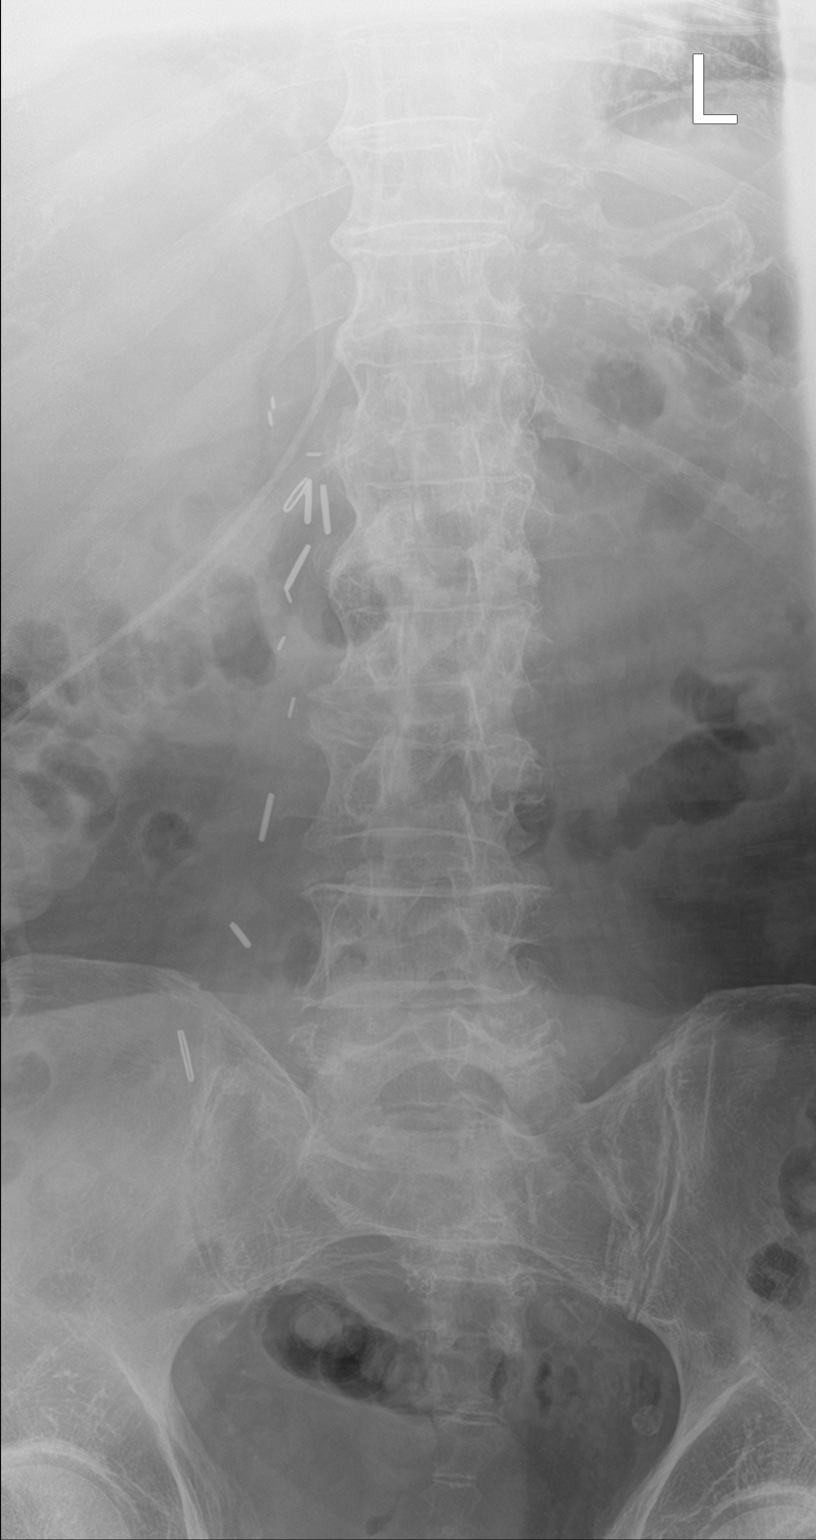

[Series 2: l-spine lat · 0.14mm/px · 2 of 2 slices shown]
[im 1/2]
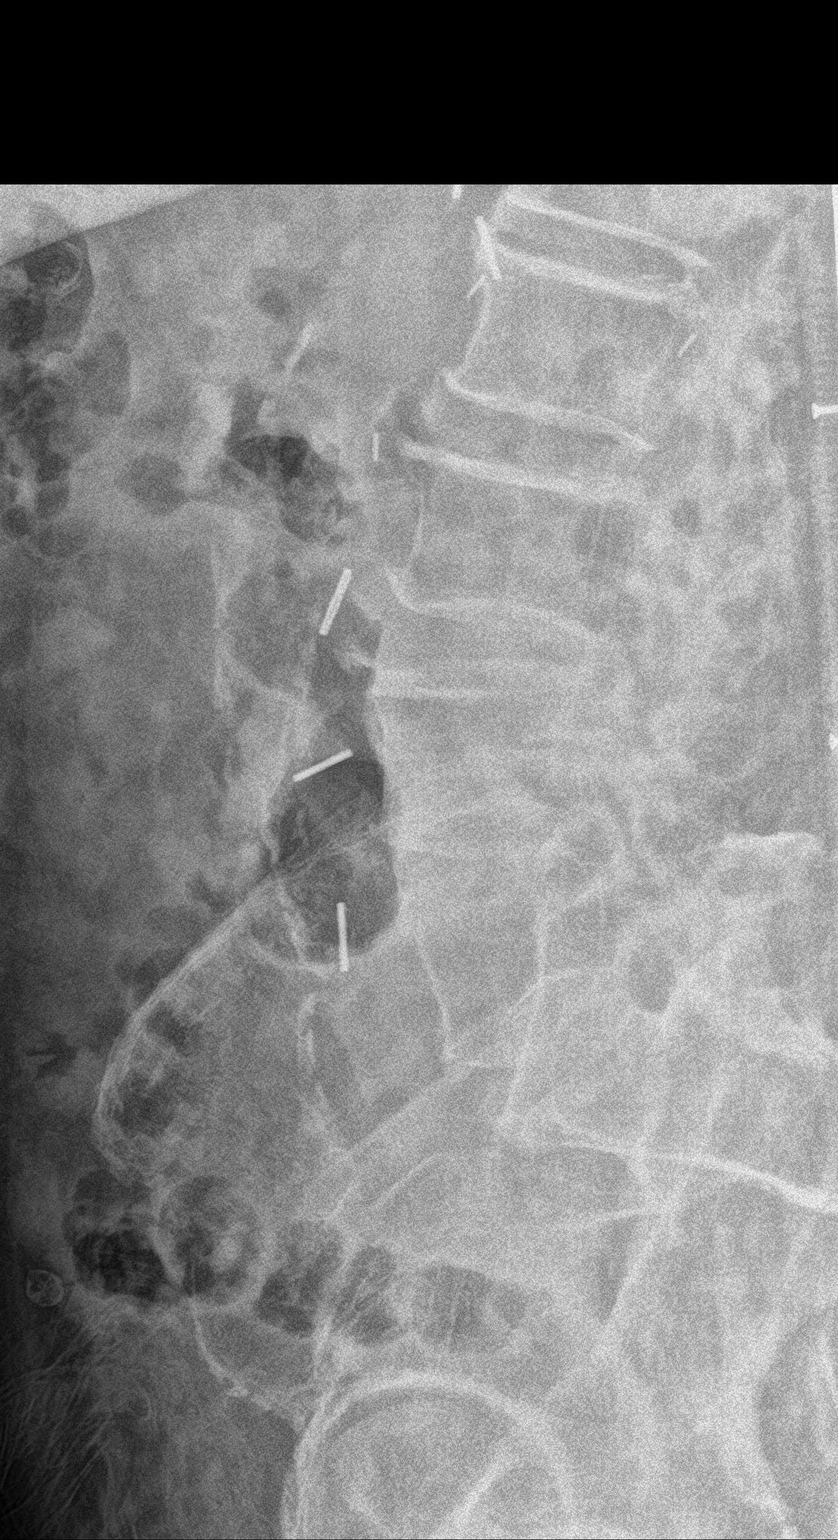
[im 2/2]
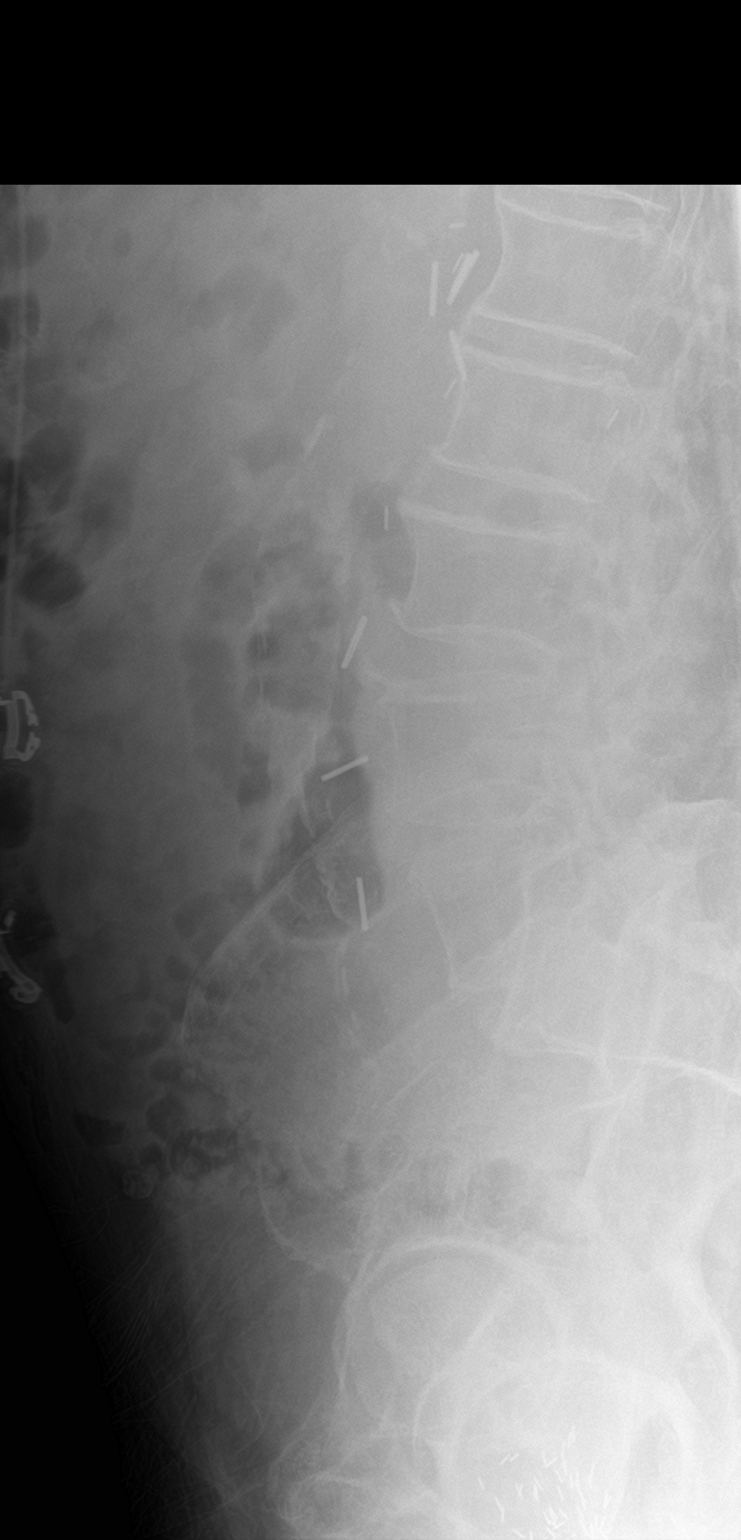

[l-spine spot]
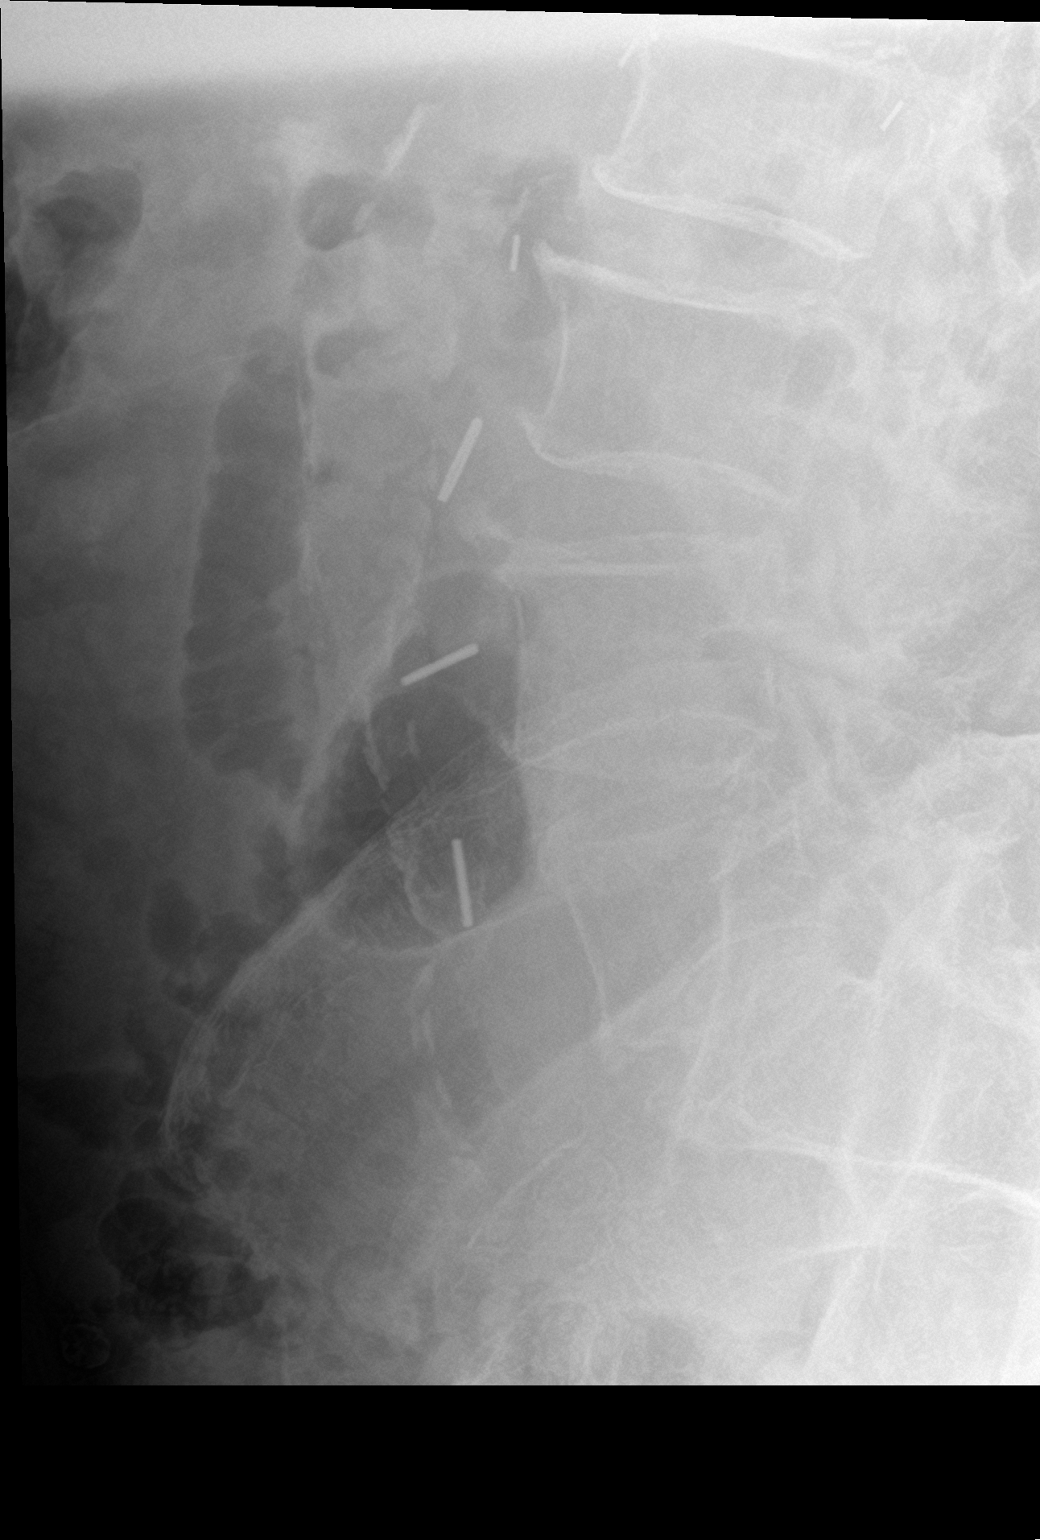

[4 of 4 positions shown; findings below may reference images not displayed]

FINDINGS: Five lumbar type vertebral segments. Vertebral body heights are
maintained. Previously described fractured osteophytes at L2 and L4,
better seen on recent CT. No change in alignment. Disc heights are
preserved. Bones are demineralized. Aortic atherosclerosis.
IMPRESSION: Previously described fractured osteophytes at L2 and L4, better seen
on recent CT. No change in alignment. No new findings.

## 2021-10-14 MED ORDER — OXYCODONE HCL 5 MG PO TABS
5.0000 mg | ORAL_TABLET | Freq: Four times a day (QID) | ORAL | Status: DC | PRN
  Administered 2021-10-14 – 2021-10-16 (×2): 5 mg via ORAL
  Filled 2021-10-14 (×4): qty 1

## 2021-10-14 MED ORDER — HYDROMORPHONE HCL 1 MG/ML IJ SOLN
0.5000 mg | INTRAMUSCULAR | Status: DC | PRN
  Administered 2021-10-14 – 2021-10-15 (×2): 0.5 mg via INTRAVENOUS
  Filled 2021-10-14 (×2): qty 1

## 2021-10-14 MED ORDER — ACETAMINOPHEN 325 MG PO TABS: 650.0000 mg | ORAL_TABLET | Freq: Four times a day (QID) | ORAL | Status: DC | PRN

## 2021-10-14 NOTE — Progress Notes (Signed)
SLP Cancellation Note  Patient Details Name: [name] MRN: [id number] DOB: 12/10/1940   Cancelled treatment:       Reason Eval/Treat Not Completed: Fatigue/lethargy limiting ability to participate- Per family, pt had Dilaudid and hasn't been rousable since.  Will continue efforts.  [name], [place] CCC/SLP Acute Rehabilitation Services Office number [phone] Pager [phone]    [name] 10/14/2021, 1:51 PM

## 2021-10-14 NOTE — Progress Notes (Signed)
[facility] Progress Note  Subjective: seen in room. RN states he had discussion/ verbal interaction with patient this am prior to his getting another IV dilaudid.  At this time is again lethargica/ somnolent and unable to give a history.   Vitals:   10/13/21 2217 10/14/21 0614 10/14/21 0700 10/14/21 0920  BP: (!) 177/80 (!) 158/72  (!) 152/70  Pulse: 74 76  78  Resp: _0 Temp: 97.8 F (36.6 C) 98.1 F (36.7 C)  98.2 F (36.8 C)  TempSrc: Oral     SpO2: 99% 100%  100%  Weight:   86.3 kg   Height:        Exam: Gen lethargic Chornically ill appearing No jvd or bruits Chest clear bilat to bases RRR no RG Abd soft ntnd no mass or ascites +bs Ext trace- 1+ UE edema, no LE edema Neuro is lethargic after IV pain meds        Abd [place] 5/29 - Right Kidney: Surgically absent. Left Kidney: 10.4 cm. Echogenicity within normal limits. No mass or hydronephrosis visualized.    MR liver noncon - Hepatobiliary: No evidence of cirrhosis. Dominant right hepatic lobe mass measures 10.5 x 12.5 x 12.7 cm on 21/5 and 21/3. Demonstrates heterogeneous primarily hyperintense T2 signal. Areas of precontrast T1 hyperintensity throughout including on series 12.    Home meds include - norvasc 2.5, asa, bystolic 38BO qd, cosopt, isosorbide mononitrate, nitroglycerin sl, pantoprazole, rosuvastatin, tresiba insulin, finerenone 10 qd        Date                          Creat               eGFR    2011                         1.48- 1.59    2012- 2014               1.54- 2.00    2018                         1.59- 1.98        31- 40 ml/min    2020                         5.92 >> 2.10    AKI episode, 8 -> 21 ml/min    August 13, 2021             4.22                 13                                                10/08/21                     3.15                 19    10/09/21                     3.00  20    10/10/21                     3.73                 15    10/11/21                      4.22                 13      BP's here 120-150 SB     alb 2.3   Tprot 5.8        CPK 3234 > 1267 > 592 > 327 today     UA  5/29 - large Hb w/ 0-5 rbc => myoglobin,  100 prot, 0-5  wbc, rare bacteria     CXR 5/29 - IMPRESSION: No acute cardiopulmonary process.     Assessment/ Plan: AKI on CKD IV - b/l creat 3.1- 3.7, eGFR 15-20.  Creat 3.1 on admission 5/29 in setting of fall, L2 fracture and mild/ mod acute rhabdo.  Pt f/b Dr Candiss Norse at [facility]. Abd [place] > absent R kidney, L kidney w/o obstruction. UA showed myoglobin (low rbc/ high Hb). Suspect mild rhabdomyolysis-related ATN as cause of AKI. Creat peaked at 4.4 then improved down to 4.1 yest and 4.0 today. MS improving as well, fully alert w/ RN this am.  Still getting intermittent IV narcotics though. Wt's are up will dc IVF"s for now. F/u creat in am.  SP fall / L2 fracture - found down at home, acute back pain is signicant issue still  Hx of MGUS - serum free LC's, SPEP and UPEP all are pending still HTN - getting BB and norvasc, BP's good. Avoid hypotension, keep SBP > 115-120 please w/ AKI Volume - mild edema, CXR 5/29 neg. Good UOP, has condom cath now. DC ivf MGUS - as above Liver mass - per pmd DM2         Rob Jaycub Noorani 10/14/2021, 2:45 PM   Recent Labs  Lab 10/10/21 0610 10/11/21 0625 10/13/21 0956 10/14/21 0340  HGB 9.8*  --   --  9.5*  ALBUMIN 2.3*  --   --  1.6*  CALCIUM 8.3*   < > 8.1* 8.1*  CREATININE 3.73*   < > 4.10* 4.00*  K 4.7   < > 3.8 3.7   < > = values in this interval not displayed.    Inpatient medications:  aspirin  81 mg Oral Daily   Chlorhexidine Gluconate Cloth  6 each Topical Q0600   dorzolamide-timolol  1 drop Both Eyes BID   heparin injection (subcutaneous)  5,000 Units Subcutaneous Q8H   insulin aspart  0-15 Units Subcutaneous TID WC   isosorbide dinitrate  20 mg Oral TID   latanoprost  1 drop Both Eyes QHS   lidocaine  1 patch Transdermal Q24H   mupirocin cream   Topical BID    nebivolol  10 mg Oral Daily   pantoprazole (PROTONIX) IV  40 mg Intravenous Daily   rosuvastatin  20 mg Oral QHS    dextrose 5 % and 0.9% NaCl 65 mL/hr at 10/14/21 0524   acetaminophen, HYDROmorphone (DILAUDID) injection, lip balm, oxyCODONE, prochlorperazine

## 2021-10-14 NOTE — Progress Notes (Signed)
Patient ID: [name], male   DOB: [birth year], 81 y.o.   MRN: [id number] BP (!) 152/70 (BP Location: Left Arm)   Pulse 78   Temp 98.2 F (36.8 C)   Resp 19   Ht 6' (1.829 m)   Wt 86.3 kg   SpO2 100%   BMI 25.80 kg/m  Alert, not oriented.  Lying in bed in distress. Informed the physician that the brace does not need to be on while in bed.  There is no emergent need for any operative intervention. Will reassess again later this week.  I spoke with the family and went over the imaging and they have a better understanding.  Will follow.

## 2021-10-14 NOTE — Progress Notes (Signed)
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  Work-up in the emergency department showed rhabdomyolysis, L2 anterior superior corner fracture.  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  Hospital course remarkable for progressive worsening of mental status, declining kidney function.  Patient transferred from [facility] to [facility] for consideration of dialysis.  Nephrology following.  Complains of severe back pain today, neurosurgery consulted.  Assessment & Plan:  Principal Problem:   Fall at home, initial encounter Active Problems:   Coronary artery disease of native artery of native heart with stable angina pectoris (HCC)   MGUS (monoclonal gammopathy of unknown significance)   Primary hypertension   CKD (chronic kidney disease) stage 4, GFR 15-29 ml/min (HCC)   HLD (hyperlipidemia)   AKI (acute kidney injury) ([place])   Type 2 diabetes mellitus with hyperlipidemia (HCC)   Closed fracture of lumbar spine without lesion of spinal cord (HCC)   GERD (gastroesophageal reflux disease)   Elevated LFTs   Rhabdomyolysis   Pressure injury of skin   Normal anion gap metabolic acidosis   Fall/L2 vertebral fracture: Fell at home.  Imaging showed  L2 anterior superior corner fracture. Case was discussed with neurosurgery.  Recommended TLSO brace and recommended outpatient follow-up.  Physical therapy saw the patient and recommended skilled nursing facility on discharge.  Skilled course remarkable for severe progressive excruciating pain.  We will order  lumbar x-ray today.  I have discussed with Dr. Saintclair Halsted, neurosurgery, we will see the patient.  AKI on CKD stage  IV/rhabdomyolysis/hyperkalemia/non-anion gap metabolic acidosis:   Creatinine was in the range of 4 on April 2023.  On admission, creatinine was 3.1.  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  Follows with nephrology.  He started on IV fluids for rhabdomyolysis.  Creatinine has been in the range of 4.  AKI suspected to be from rhabdomyolysis associated  ATN.  Transferred to [facility] for consideration of dialysis.  Continue sodium bicarb tablets.Hyperkalemia resolved with Kayexalate.  Nephrology holding for plan of dialysis because of slight improvement in the kidney function today.  Altered mental status: Secondary to metabolic encephalopathy from from uremia or narcotics.  Continue to monitor.  Minimize narcotics, sedatives.  Delirium precautions.  He is currently oriented to place.He has been having poor oral intake.  Continue fluids.  Pain medication restarted due to severe pain.  Hypertension: Continue bisoprolol,imdur.  Monitor blood pressure  Liver mass/abnormal LFTs: Abdominal ultrasound showed right hepatic lobe mass about 12 cm.  MRI confirmed right hepatic lobe mass suspicious for isolated melanoma metastasis.  Elevated alpha-fetoprotein. Elevated Liver enzymes. Hepatitis panel negative.  His oncologist is aware about this finding and he has been set up an appointment as an outpatient.  Diabetes type 2: Recent hemoglobin A1c of 8.8.  On Tarceva prior to admission.  Currently on sliding scale insulin.  Normocytic anemia/iron deficiency: Secondary to CKD, MGUS.  Iron studies on 5/31 showed iron of 14.  Given IV iron.  Continue to monitor hemoglobin  Leukocytosis: Likely reactive.  Continue to monitor  Dysphagia: Speech therapy following, recommended NpO for now  Debility/deconditioning: Patient seen by PT/OT and recommended skilled nursing facility on discharge  Pressure Injury 10/10/21 Head Posterior Stage 2 -  Partial thickness loss of dermis presenting as a shallow open  injury with a red, pink wound bed without slough. (Active)  10/10/21   Location: Head  Location Orientation: Posterior  Staging: Stage 2 -  Partial thickness loss of dermis presenting as a shallow open injury with a red, pink wound bed without slough.  Wound Description (Comments):   Present on Admission: Yes  Dressing Type Other (Comment) 10/13/21 0800    DVT prophylaxis:heparin injection 5,000 Units Start: 10/10/21 1400 SCDs Start: 10/08/21 1541     Code Status: Full Code  Family Communication: Brother at the bedside.Called and discussed with daughter on phone on 6/4  Patient status:Inpatient  Patient is from :Home  Anticipated discharge to: Skilled nursing facility  Estimated DC date: Not sure, waiting improvement in the renal function, mental status.  Neurosurgery will consult on him   Consultants: neurosurgery,nephrology  Procedures:None  Antimicrobials:  Anti-infectives (From admission, onward)    None       Subjective:  Patient seen and examined at the bedside this morning.  Hemodynamically stable.  Complains of severe back pain today.  Mental status might have improved.,  Alert, awake but in severe distress due to pain.  Brother at bedside.  Objective: Vitals:   10/13/21 2217 10/14/21 0614 10/14/21 0700 10/14/21 0920  BP: (!) 177/80 (!) 158/72  (!) 152/70  Pulse: 74 76  78  Resp: '16 20  19  '$ Temp: 97.8 F (36.6 C) 98.1 F (36.7 C)  98.2 F (36.8 C)  TempSrc: Oral     SpO2: 99% 100%  100%  Weight:   86.3 kg   Height:        Intake/Output Summary (Last 24 hours) at 10/14/2021 1055 Last data filed at 10/14/2021 0900 Gross per 24 hour  Intake 301.3 ml  Output 875 ml  Net -573.7 ml   Filed Weights   10/08/21 1036 10/08/21 1542 10/14/21 0700  Weight: 81.6 kg 83.6 kg 86.3 kg    Examination:  General exam: In moderate to severe distress due to pain, looks deconditioned, lying in bed HEENT: PERRL Respiratory system:  no wheezes or crackles   Cardiovascular system: S1 & S2 heard, RRR.  Gastrointestinal system: Abdomen is nondistended, soft and nontender. Central nervous system: Alert and awake, oriented to place extremities: Trace edema of the left upper extremity, TLSO brace Skin: No rashes, no ulcers,no icterus     Data Reviewed: I have personally reviewed following labs and imaging studies  CBC: Recent Labs  Lab 10/08/21 1115 10/09/21 0611 10/10/21 0610 10/14/21 0340  WBC 15.6* 12.5* 13.1* 14.7*  NEUTROABS 13.2*  --   --   --   HGB 11.2* 10.3* 9.8* 9.5*  HCT 34.3* 32.9* 31.7* 28.6*  MCV 86.4 87.7 88.5 81.9  PLT 127* 113* 122* 297   Basic Metabolic Panel: Recent Labs  Lab 10/10/21 0610 10/11/21 0625 10/12/21 0524 10/13/21 0956 10/14/21 0340  NA 142 139 142 144 148*  K 4.7 5.3* 4.5 3.8 3.7  CL 112* 108 112* 113* 118*  CO2 22 19* 21* 18* 20*  GLUCOSE 81 111* 133* 134* 233*  BUN 63* 74* 77* 75* 73*  CREATININE 3.73* 4.22* 4.40* 4.10* 4.00*  CALCIUM 8.3* 8.3* 8.1* 8.1* 8.1*     Recent Results (from the past 240 hour(s))  Resp Panel by RT-PCR (Flu A&B, Covid) Anterior Nasal Swab     Status: None   Collection Time: 10/08/21 11:09 AM  Specimen: Anterior Nasal Swab  Result Value Ref Range Status   SARS Coronavirus 2 by RT PCR NEGATIVE NEGATIVE Final    Comment: (NOTE) SARS-CoV-2 target nucleic acids are NOT DETECTED.  The SARS-CoV-2 RNA is generally detectable in upper respiratory specimens during the acute phase of infection. The lowest concentration of SARS-CoV-2 viral copies this assay can detect is 138 copies/mL. A negative result does not preclude SARS-Cov-2 infection and should not be used as the sole basis for treatment or other patient management decisions. A negative result may occur with  improper specimen collection/handling, submission of specimen other than nasopharyngeal swab, presence of viral mutation(s) within the areas targeted by this assay, and inadequate number of  viral copies(<138 copies/mL). A negative result must be combined with clinical observations, patient history, and epidemiological information. The expected result is Negative.  Fact Sheet for Patients:  EntrepreneurPulse.com.au  Fact Sheet for Healthcare Providers:  IncredibleEmployment.be  This test is no t yet approved or cleared by the [place] FDA and  has been authorized for detection and/or diagnosis of SARS-CoV-2 by FDA under an Emergency Use Authorization (EUA). This EUA will remain  in effect (meaning this test can be used) for the duration of the COVID-19 declaration under Section 564(b)(1) of the Act, 21 U.S.C.section 360bbb-3(b)(1), unless the authorization is terminated  or revoked sooner.       Influenza A by PCR NEGATIVE NEGATIVE Final   Influenza B by PCR NEGATIVE NEGATIVE Final    Comment: (NOTE) The Xpert Xpress SARS-CoV-2/FLU/RSV plus assay is intended as an aid in the diagnosis of influenza from Nasopharyngeal swab specimens and should not be used as a sole basis for treatment. Nasal washings and aspirates are unacceptable for Xpert Xpress SARS-CoV-2/FLU/RSV testing.  Fact Sheet for Patients: EntrepreneurPulse.com.au  Fact Sheet for Healthcare Providers: IncredibleEmployment.be  This test is not yet approved or cleared by the [place] FDA and has been authorized for detection and/or diagnosis of SARS-CoV-2 by FDA under an Emergency Use Authorization (EUA). This EUA will remain in effect (meaning this test can be used) for the duration of the COVID-19 declaration under Section 564(b)(1) of the Act, 21 U.S.C. section 360bbb-3(b)(1), unless the authorization is terminated or revoked.  Performed at [facility], [place] [address] [place], [place] [address]      Radiology Studies: No results found.  Scheduled Meds:  aspirin  81 mg Oral Daily    Chlorhexidine Gluconate Cloth  6 each Topical Q0600   dorzolamide-timolol  1 drop Both Eyes BID   heparin injection (subcutaneous)  5,000 Units Subcutaneous Q8H   insulin aspart  0-15 Units Subcutaneous TID WC   isosorbide dinitrate  20 mg Oral TID   latanoprost  1 drop Both Eyes QHS   lidocaine  1 patch Transdermal Q24H   mupirocin cream   Topical BID   nebivolol  10 mg Oral Daily   pantoprazole (PROTONIX) IV  40 mg Intravenous Daily   rosuvastatin  20 mg Oral QHS   Continuous Infusions:  dextrose 5 % and 0.9% NaCl 65 mL/hr at 10/14/21 0524   ferric gluconate (FERRLECIT) IVPB Stopped (10/13/21 1433)     LOS: 6 days   Shelly Coss, MD [facility] P6/08/2021, 10:55 AM
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
## 2021-10-14 NOTE — TOC Progression Note (Signed)
Transition of Care [facility]) - Progression Note    Patient Details  Name: [name] MRN: [id number] Date of Birth: [birth year]  Transition of [facility]) CM/SW [place], RN Phone Number: 10/14/2021, 5:24 PM  Clinical Narrative:     Patient is having pain, when gets medication is very somnolent. No surgical intervention planned for back issue with neurosurgery.   Multiple problems including enlarging liver mass ( previous prostate and renal cell carc)  This may be a new primary. Had a bed at [facility], still receiving IV pain medication., so SNF on hold.  No clear understanding of discharge date  Recommend Palliative consult for goals of care discussion  TOC will continue to follow for needs, recommendations, and transitions Expected Discharge Plan: Skilled Nursing Facility Barriers to Discharge: Continued Medical Work up, SNF Pending bed offer, Ship broker  Expected Discharge Plan and Services Expected Discharge Plan: [place] In-house Referral: Clinical Social Work   Post Acute Care Choice: [place] Living arrangements for the past 2 months: Single Family Home                 DME Arranged: N/A DME Agency: NA                   Social Determinants of Health (SDOH) Interventions    Readmission Risk Interventions    10/09/2021    2:37 PM  Readmission Risk Prevention Plan  Transportation Screening Complete  PCP or Specialist Appt within 5-7 Days Complete  Home Care Screening Complete  Medication Review (RN CM) Complete

## 2021-10-15 ENCOUNTER — Inpatient Hospital Stay (HOSPITAL_COMMUNITY): Payer: Medicare Other

## 2021-10-15 LAB — UPEP/UIFE/LIGHT CHAINS/TP, 24-HR UR
% BETA, Urine: 52.4 %
ALPHA 1 URINE: 8.4 %
Albumin, U: 8.5 %
Alpha 2, Urine: 17.9 %
Free Kappa Lt Chains,Ur: 135.76 mg/L — ABNORMAL HIGH (ref 1.17–86.46)
Free Kappa/Lambda Ratio: 0.99 — ABNORMAL LOW (ref 1.83–14.26)
Free Lambda Lt Chains,Ur: 137.82 mg/L — ABNORMAL HIGH (ref 0.27–15.21)
GAMMA GLOBULIN URINE: 12.7 %
M-SPIKE %, Urine: 5.3 % — ABNORMAL HIGH
M-Spike, Mg/24 Hr: 61 mg/24 hr — ABNORMAL HIGH
Total Protein, Urine-Ur/day: 1157 mg/24 hr — ABNORMAL HIGH (ref 30–150)
Total Protein, Urine: 72.3 mg/dL
Total Volume: 1600

## 2021-10-15 LAB — GLUCOSE, CAPILLARY
Glucose-Capillary: 180 mg/dL — ABNORMAL HIGH (ref 70–99)
Glucose-Capillary: 218 mg/dL — ABNORMAL HIGH (ref 70–99)
Glucose-Capillary: 143 mg/dL — ABNORMAL HIGH (ref 70–99)
Glucose-Capillary: 276 mg/dL — ABNORMAL HIGH (ref 70–99)

## 2021-10-15 LAB — PROTEIN ELECTROPHORESIS, SERUM
A/G Ratio: 0.6 — ABNORMAL LOW (ref 0.7–1.7)
Albumin ELP: 2 g/dL — ABNORMAL LOW (ref 2.9–4.4)
Alpha-1-Globulin: 0.5 g/dL — ABNORMAL HIGH (ref 0.0–0.4)
Alpha-2-Globulin: 1 g/dL (ref 0.4–1.0)
Beta Globulin: 0.7 g/dL (ref 0.7–1.3)
Gamma Globulin: 1 g/dL (ref 0.4–1.8)
Globulin, Total: 3.2 g/dL (ref 2.2–3.9)
M-Spike, %: 0.3 g/dL — ABNORMAL HIGH
Total Protein ELP: 5.2 g/dL — ABNORMAL LOW (ref 6.0–8.5)

## 2021-10-15 LAB — BASIC METABOLIC PANEL
Anion gap: 9 (ref 5–15)
BUN: 72 mg/dL — ABNORMAL HIGH (ref 8–23)
CO2: 20 mmol/L — ABNORMAL LOW (ref 22–32)
Calcium: 8.2 mg/dL — ABNORMAL LOW (ref 8.9–10.3)
Chloride: 123 mmol/L — ABNORMAL HIGH (ref 98–111)
Creatinine, Ser: 4.19 mg/dL — ABNORMAL HIGH (ref 0.61–1.24)
GFR, Estimated: 13 mL/min — ABNORMAL LOW (ref >60)
Glucose, Bld: 202 mg/dL — ABNORMAL HIGH (ref 70–99)
Potassium: 3.5 mmol/L (ref 3.5–5.1)
Sodium: 152 mmol/L — ABNORMAL HIGH (ref 135–145)

## 2021-10-15 LAB — CBC
HCT: 27.5 % — ABNORMAL LOW (ref 39.0–52.0)
Hemoglobin: 8.9 g/dL — ABNORMAL LOW (ref 13.0–17.0)
MCH: 27 pg (ref 26.0–34.0)
MCHC: 32.4 g/dL (ref 30.0–36.0)
MCV: 83.3 fL (ref 80.0–100.0)
Platelets: 224 10*3/uL (ref 150–400)
RBC: 3.3 MIL/uL — ABNORMAL LOW (ref 4.22–5.81)
RDW: 16.7 % — ABNORMAL HIGH (ref 11.5–15.5)
WBC: 14 10*3/uL — ABNORMAL HIGH (ref 4.0–10.5)
nRBC: 0 % (ref 0.0–0.2)

## 2021-10-15 LAB — KAPPA/LAMBDA LIGHT CHAINS
Kappa free light chain: 37 mg/L — ABNORMAL HIGH (ref 3.3–19.4)
Kappa, lambda light chain ratio: 0.66 (ref 0.26–1.65)
Lambda free light chains: 55.8 mg/L — ABNORMAL HIGH (ref 5.7–26.3)

## 2021-10-15 IMAGING — MR MR LUMBAR SPINE W/O CM
4 of 7 series · 17 of 48 positions shown · non-contrast
Comparison: CT lumbar spine [DATE].

Abdomen MRI [DATE].

CLINICAL DATA: 80-year-old male with recent fall. Back pain. L2 and
L4 anterior superior endplate/osteophyte fractures in the setting of
flowing endplate osteophytes and ankylosis on lumbar spine CT last
month. Pain. Weakness. Large liver mass.

EXAM:
MRI LUMBAR SPINE WITHOUT CONTRAST
TECHNIQUE: Multiplanar, multisequence MR imaging of the lumbar spine was
performed. No intravenous contrast was administered.

[Series 7: T1 · sagittal · 4.0mm · 0.51mm/px · 3 of 14 slices shown]
[im 1/14]
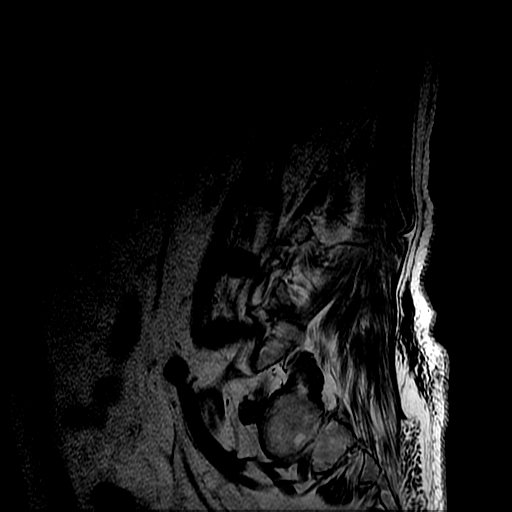
[im 7/14]
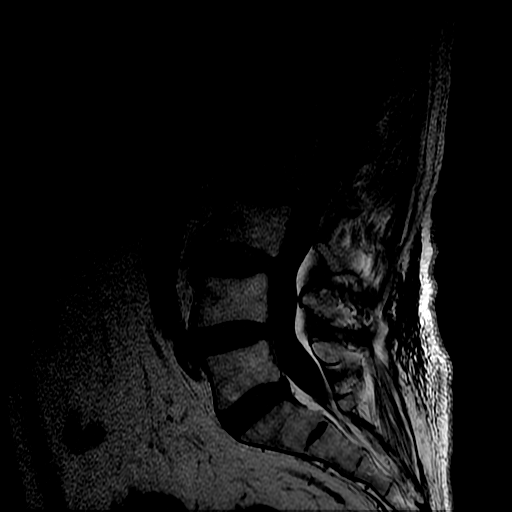
[im 14/14]
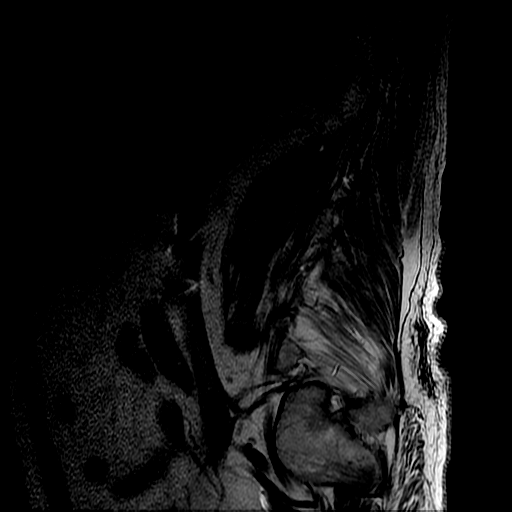

[Series 10: T2 · sagittal · 4.0mm · 0.51mm/px · 4 of 14 slices shown (1 of 3)]
[im 1/14]
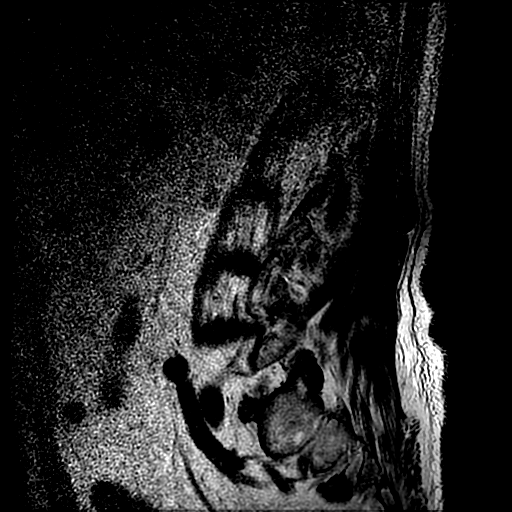
[im 5/14]
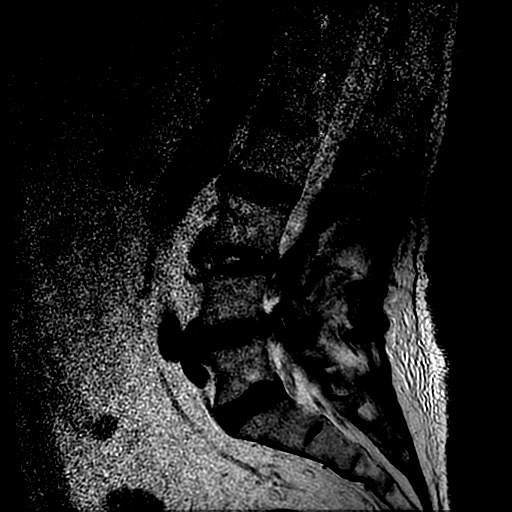
[im 9/14]
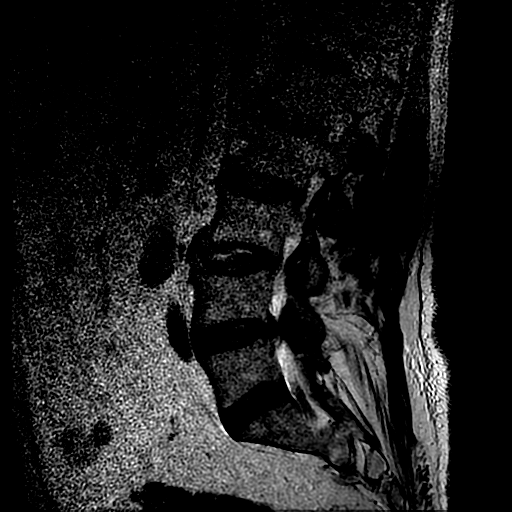
[im 14/14]
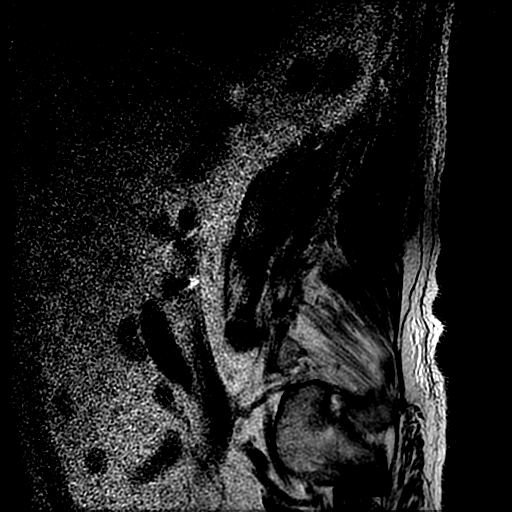

[Series 16: T2 · sagittal · 4.0mm · 0.51mm/px · 5 of 15 slices shown (2 of 3)]
[im 1/15]
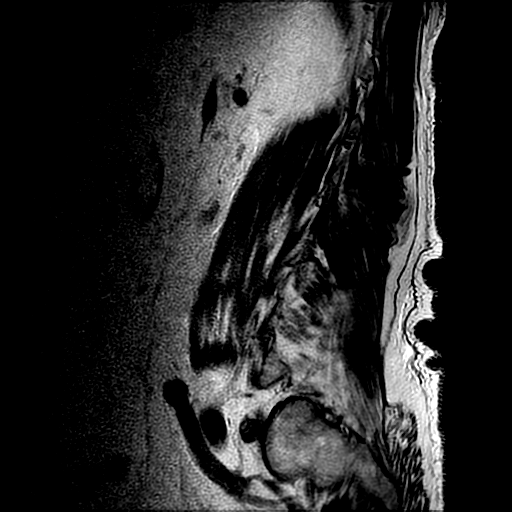
[im 4/15]
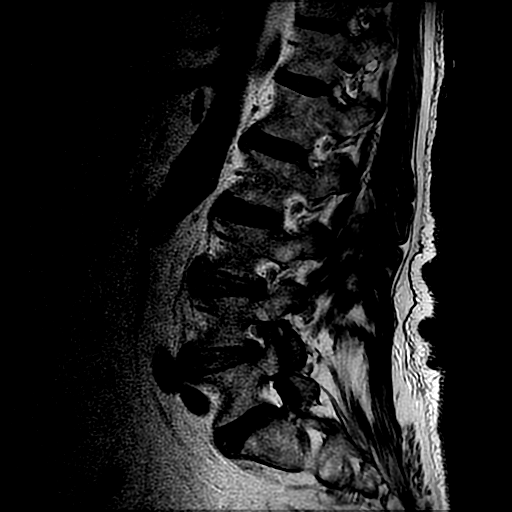
[im 8/15]
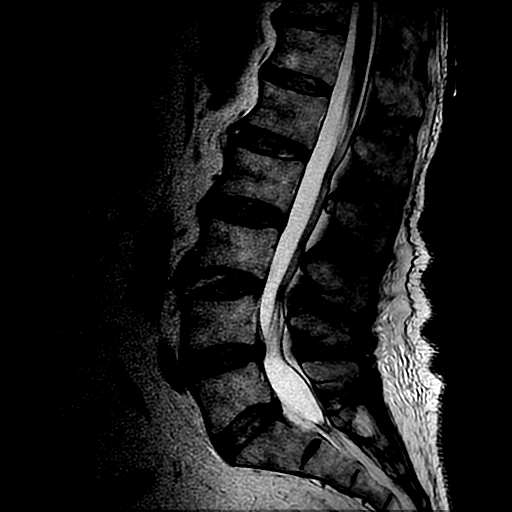
[im 11/15]
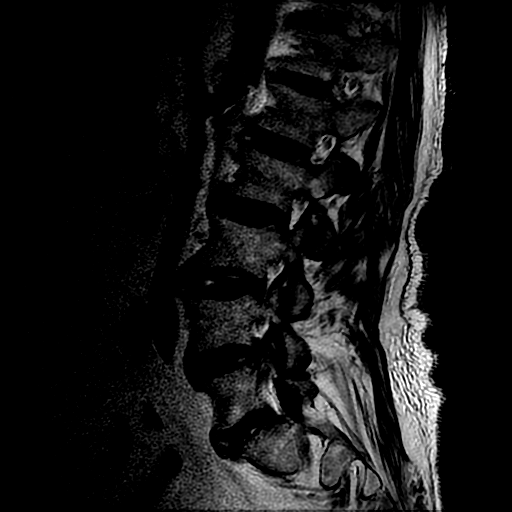
[im 15/15]
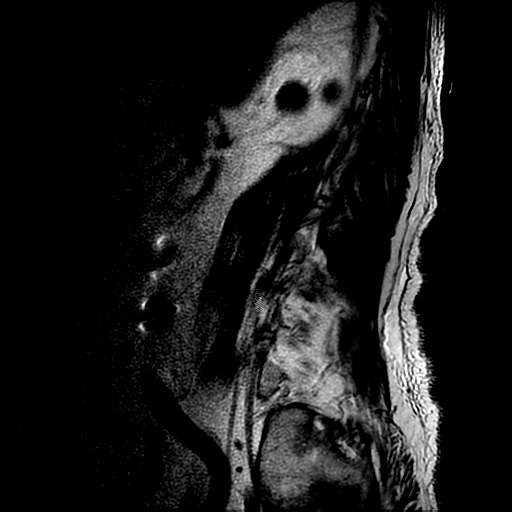

[Series 17: T2 · axial · 4.0mm · 0.39mm/px · z∈[-240,-49]mm · 5 of 43 slices shown (3 of 3)]
[im 1/43]
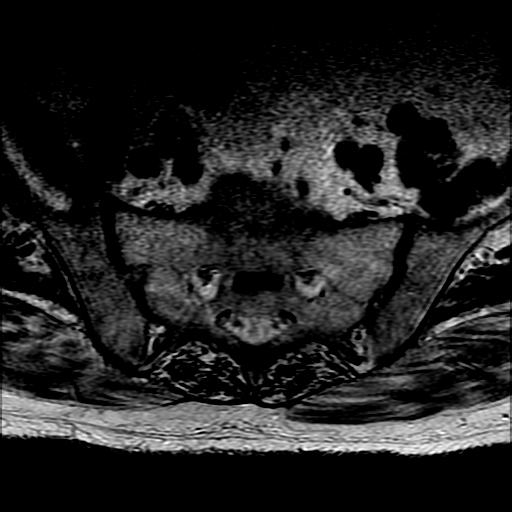
[im 8/43]
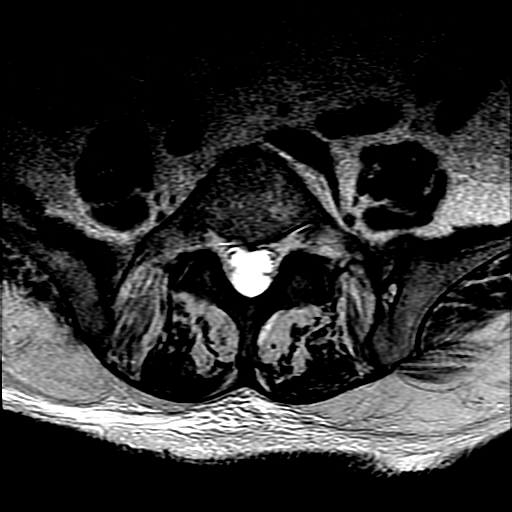
[im 15/43]
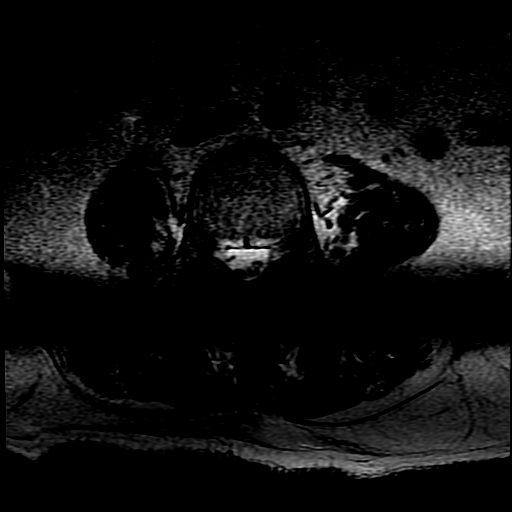
[im 22/43]
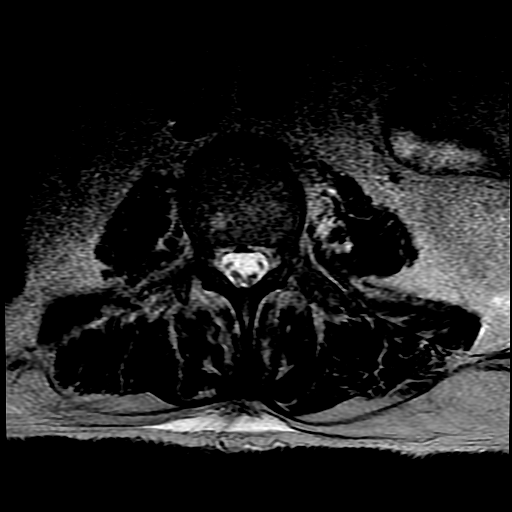
[im 36/43]
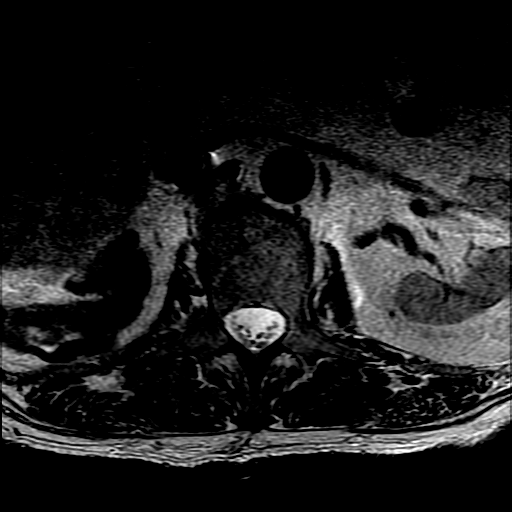

[17 of 48 positions shown; findings below may reference images not displayed]

FINDINGS: Segmentation:  Normal on the comparison.

Alignment: Lumbar lordosis is stable and relatively preserved. No
significant spondylolisthesis.

Vertebrae: Fairly subtle marrow edema at the anterior superior L2
endplate associated with the recent CT finding. At L4, the dominant
finding is a large tear of the L3-L4 disc, oblique on series 16,
image 7. This tracks toward the L4 osteophyte fractures seen
recently by CT.

There is also a nondisplaced fracture of the tip of the L4 spinous
process evident on series 15, image 8.

No other marrow edema or acute osseous abnormality identified.
Intact visible sacrum and SI joints. Normal background bone marrow
signal.

Conus medullaris and cauda equina: Conus extends to the T12-L1
level. No lower spinal cord or conus signal abnormality. Normal
cauda equina nerve roots, with capacious spinal canal at most
levels.

Paraspinal and other soft tissues: Absent right kidney again noted.
Large right liver mass partially visible, see abdomen MRI
[DATE].

Disc levels:

Visible lower thoracic levels through L2-L3 are negative for age,
with underlying interbody ankylosis via flowing endplate osteophytes
(traumatically interrupted at L1-L2 as seen on [DATE].

L3-L4: Long oblique tear of the disc tracking to the fractured
anterior superior L4 osteophyte (series 15, image 7). Left far
lateral broad-based disc bulge/protrusion (series 16, image 13). But
no associated spinal stenosis. There is mild to moderate left L3
foraminal stenosis.

L4-L5: Circumferential disc bulge with endplate spurring. Prominent
left far lateral component here also. Mild to moderate facet and
ligament flavum hypertrophy. Trace degenerative facet joint fluid.
Mild spinal stenosis. Mild to moderate bilateral lateral recess
stenosis (L5 nerve levels). Moderate to severe bilateral L4
foraminal stenosis.

L5-S1: Negative disc.  Mild endplate spurring.  No stenosis.
IMPRESSION: 1. Underlying lower thoracic and upper lumbar interbody ankylosis
from flowing endplate osteophytes.
Acute fractures of the anterior superior endplates/osteophytes at
both L2 and L4 are better demonstrated on the recent CT.
But this exam demonstrates a long tear of the L3-L4 disc, and a
nondisplaced fracture of the L4 spinous process, in association with
the L4 injury. No subsequent spinal stenosis at that level but bulky
leftward/lateral disc herniation is associated with up to moderate
left L3 neural foraminal stenosis.

2. Disc and posterior element degeneration at L4-L5 with mild spinal
stenosis, mild to moderate bilateral lateral recess stenosis, and
moderate to severe bilateral L4 foraminal stenosis.

3. Large right Liver mass, see Abdomen MRI [DATE].

## 2021-10-15 MED ORDER — DEXTROSE 5 % IV SOLN: INTRAVENOUS | Status: DC

## 2021-10-15 MED ORDER — BOOST / RESOURCE BREEZE PO LIQD CUSTOM
1.0000 | Freq: Three times a day (TID) | ORAL | Status: DC
  Administered 2021-10-15: 1 via ORAL
  Administered 2021-10-16: 1 mL via ORAL
  Administered 2021-10-17 – 2021-10-22 (×8): 1 via ORAL

## 2021-10-15 MED ORDER — INSULIN GLARGINE-YFGN 100 UNIT/ML ~~LOC~~ SOLN
5.0000 [IU] | Freq: Every day | SUBCUTANEOUS | Status: DC
  Administered 2021-10-15 – 2021-10-16 (×2): 5 [IU] via SUBCUTANEOUS
  Filled 2021-10-15 (×2): qty 0.05

## 2021-10-15 MED ORDER — SODIUM CHLORIDE 0.45 % IV SOLN: INTRAVENOUS | Status: DC

## 2021-10-15 MED ORDER — FENTANYL CITRATE PF 50 MCG/ML IJ SOSY
12.5000 ug | PREFILLED_SYRINGE | INTRAMUSCULAR | Status: DC | PRN
  Administered 2021-10-15 – 2021-10-17 (×5): 25 ug via INTRAVENOUS
  Administered 2021-10-17 (×2): 12.5 ug via INTRAVENOUS
  Filled 2021-10-15 (×8): qty 1

## 2021-10-15 NOTE — Progress Notes (Signed)
Daily Progress Note   Patient Name: [name]       Date: 10/15/2021 DOB: [birth year]  Age: 81 y.o. MRN#: [id number] Attending Physician: Shelly Coss, MD Primary Care Physician: Burnard Bunting, MD Admit Date: 10/08/2021 Length of Stay: 7 days  Reason for Consultation/Follow-up: Establishing goals of care  HPI/Patient Profile:  81 y.o. 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  Work-up in the emergency department showed rhabdomyolysis, L2 anterior superior fracture.  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  Hospital course remarkable for progressive worsening of mental status, declining kidney function.  Patient transferred from [facility] to [facility] for consideration of dialysis.  Nephrology following.   PMT consulted for [place] conversations.  Subjective:   Subjective: Chart Reviewed. Updates received. Patient Assessed. Created space and opportunity for patient  and family to explore thoughts and feelings regarding current medical situation.  Today's Discussion: Today I met the patient bedside.  The patient was not able to meaningfully participate in conversation as he just recently received IV Dilaudid prior to lumbar MRI.  The patient's wife [name] and brother [name] were at the bedside.  We discussed his current status compared to yesterday.  We state that we know he is still in pain, but the Dilaudid is making him sleepy.  We discussed that we may be able to change his pain medication to see if we can get and control his pain and reduce his somnolence.  They inform me that neurosurgery has said that he is not a candidate for surgery.  They will reevaluate him later this week.  We discussed his kidney function and that his creatinine has slowly creeped up a bit today to 4.19 (4.0  yesterday).  However, nephrology is still following.  We discussed there may be options they can provide to help notes his kidney function in the right direction.  I discussed my hesitancy about hemodialysis is I think that his body would not tolerate this very well.  Finally, we had a lengthy discussion about his current liver mass.  They seemed a bit unsure about how bad his liver mass was not Referring to it as a "spot on his liver".  I shared MRI imaging to demonstrate the size and significance of the tumor.  They seemed quite shocked by this.  They discussed they were not previously told how bad this actually looked.  I explained that if it is melanoma it would be very difficult to treat.  They then shared that they were told that he is not a candidate for chemotherapy (presumably by [facility] oncology).  I shared that while his kidney function and his vertebral fracture are the acute situations were working on now, overall the status of the liver tumor would be what really directs the ensuing months as far as his ability to continue to thrive or decline.  I discussed that while the family friend indicated feeding tubes for nutrition that we could place a tube in allow artificial formula to be given to him for nutrition sake, we should really evaluate if this is something he would want knowing his current clinical situation and the fact that it would not help her fix his liver tumor, his kidney disease, his back fracture, or his ability to tolerate treatment for any 1 of these.  His  wife seems to understand the gravity of these issues.  She stated that he does have a living will and healthcare power of attorney and I recommended she bring those in so we can review them and scanned them into the chart in order to best care for him based on his wishes.  She indicated that she would do that.  At the end of my visit his wife and brother both thanked me for being frank and fully explaining  everything that is truly going on.  They feel much better informed at this point.  We discussed that there will be further difficult conversations.  A colleague of mine, Andre Lessen, NP would be following up with him in the coming days.  I shared that she can be frank and try to help them further understand his current clinical situation.  They expressed that they prefer to be told what is going on and to not "to be around the bush".  I provided emotional and general support through therapeutic listening, empathy, sharing stories, and other techniques.  Answered all questions and addressed all concerns to the best of my ability.  Review of Systems  Unable to perform ROS: Mental status change   Objective:   Vital Signs:  BP (!) 133/56 (BP Location: Left Arm)   Pulse 72   Temp (!) 97.5 F (36.4 C) (Oral)   Resp 16   Ht 6' (1.829 m)   Wt 83.1 kg   SpO2 96%   BMI 24.85 kg/m   Physical Exam: Physical Exam Vitals and nursing note reviewed.  Constitutional:      General: He is sleeping. He is not in acute distress. HENT:     Head: Normocephalic and atraumatic.  Cardiovascular:     Rate and Rhythm: Normal rate.  Pulmonary:     Effort: Pulmonary effort is normal. No respiratory distress.  Abdominal:     General: Abdomen is protuberant.     Palpations: Abdomen is soft.  Skin:    General: Skin is warm and dry.  Neurological:     Mental Status: He is lethargic.    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   He is now brought to the emergency room and admitted for fall with L2 vertebral fracture, rhabdomyolysis causing AKI on CKD with creatinine initially coming up to 4.4 (baseline at admission 3.1) but is now declined somewhat to 4.0.  Nephrology is on board and monitoring for any need for hemodialysis.  He also has a liver mass 12 cm which his oncologist is aware of and planning outpatient follow-up.  Another issue is altered mental status secondary to metabolic encephalopathy likely multifactorial including uremia and narcotic effect.  Family seems to be clear for full code, full scope at this time.  I shared imaging of the liver tumor with them today and they are now aware how serious this is. They state they have been told he's not a chemo candidate. They seem to be better informed about his global clinical situation.  They are planning to bring in living will and HCPOA paperwork for  review and scanning into the system.   **ADDENDUM: I received a message from the hospitalist about patient's somnolence with dilaudid and that he is going to discontinue it. Was asked to put in an order for a different pain medication.  SUMMARY OF RECOMMENDATIONS   Continue full code for now Obtain living will/wishes and HCPOA Change dilaudid back to Fentanyl 12.5-25 mcg q 3 hrs If patient becomes more awake/aware, involve him in [place] discussions Continued [place] discussions to include CODE STATUS now that family more aware of global issues PMT will continue to follow My colleague Andre Lessen, NP will follow-up tomorrow  Symptom Management:  Stop IV dilaudid Start IV Fentanyl 12.5-25 mcg q 3 hrs prn  Code Status: Full code  Prognosis: Unable to determine  Discharge Planning: To Be Determined  Discussed with: Medical team, nursing team, patient's family  Thank you for allowing [place] to participate in the care of [name] PMT will continue to support holistically.  Time Total: 90 min  Visit consisted of  counseling and education dealing with the complex and emotionally intense issues of symptom management and palliative care in the setting of serious and potentially life-threatening illness. Greater than 50%  of this time was spent counseling and coordinating care related to the above assessment and plan.  Andre Field, NP Palliative Medicine Team  Team Phone # [phone] (Nights/Weekends)  01/09/2021, 8:17 AM

## 2021-10-15 NOTE — Progress Notes (Addendum)
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  Work-up in the emergency department showed rhabdomyolysis, L2 anterior superior corner fracture.  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  Hospital course remarkable for progressive worsening of mental status, declining kidney function.  Patient transferred from  [facility] to [facility] for consideration of dialysis.  Nephrology following.  Neurosurgery also consulted for persistent back pain.  Assessment & Plan:  Principal Problem:   Fall at home, initial encounter Active Problems:   Coronary artery disease of native artery of native heart with stable angina pectoris (HCC)   MGUS (monoclonal gammopathy of unknown significance)   Primary hypertension   CKD (chronic kidney disease) stage 4, GFR 15-29 ml/min (HCC)   HLD (hyperlipidemia)   AKI (acute kidney injury) ([place])   Type 2 diabetes mellitus with hyperlipidemia (HCC)   Closed fracture of lumbar spine without lesion of spinal cord (HCC)   GERD (gastroesophageal reflux disease)   Elevated LFTs   Rhabdomyolysis   Pressure injury of skin   Normal anion gap metabolic acidosis   Fall/L2 vertebral fracture: Fell at home.CT   Imaging showed  L2 anterior superior corner fracture. Case was discussed with neurosurgery.  Recommended TLSO brace and recommended outpatient follow-up.  Physical therapy saw the patient and recommended skilled nursing facility on discharge.  Hospital course remarkable for severe progressive excruciating back  pain.  Neurosurgery consulted and following.   MRI of the lumbar spine showed  acute fractures of the anterior superior  endplates/osteophytes atboth L2 and L4 ,a long tear of the L3-L4 disc, and a nondisplaced fracture of the L4 spinous process, in association with the L4 injury.  Continue supportive care, pain management  AKI on CKD stage IV/rhabdomyolysis/hyperkalemia/non-anion gap metabolic acidosis:   Creatinine was in the range of 4 on April 2023.  On admission, creatinine was 3.1.  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  Follows with nephrology.  He started on IV fluids for rhabdomyolysis.  Creatinine has been in the range of 4.  AKI suspected to be from rhabdomyolysis associated  ATN.  Transferred to [facility] for consideration of dialysis.  Continue sodium bicarb tablets.Hyperkalemia resolved with Kayexalate.  Awaiting recommendation from nephrology regarding dialysis.Lab work today also shows hypernatremia.  Altered mental status: Secondary to metabolic encephalopathy from from uremia or narcotics.  Continue to monitor.  Minimize narcotics, sedatives.  Delirium precautions.  He is currently oriented to place.He has been having poor oral intake.  IV fluid stopped.    Hypertension: Continue bisoprolol,imdur.  Monitor blood pressure  Liver mass/abnormal LFTs: Abdominal ultrasound showed right hepatic lobe mass about 12 cm.  MRI confirmed right hepatic lobe mass suspicious for isolated melanoma metastasis.  Elevated alpha-fetoprotein. Elevated Liver enzymes. Hepatitis panel negative.  His oncologist is aware about this finding and he has been set up an appointment as an outpatient.  Diabetes type 2: Recent hemoglobin A1c of 8.8.  On Tarceva prior to admission.  Currently on sliding scale insulin.  Normocytic anemia/iron deficiency: Secondary to CKD, MGUS.  Iron studies on 5/31 showed iron of 14.  Given IV iron.  Continue to monitor hemoglobin  Leukocytosis: Likely reactive, cud be associated  with ongoing medical problems  including malignancy, AKI.  Continue to monitor  Dysphagia: Speech therapy  following, recommended NpO for now due to persistent lethargy.  If he does not take orally in 12 to 24 hours, will consider putting to for feeding  Debility/deconditioning: Patient seen by PT/OT and recommended skilled nursing facility on discharge  Goals of care: Multiple comorbidities in this elderly patient including possible liver cancer, severe renal failure, lumbar vertebral fracture.  He was completely independent and  ambulatory until he fell.  Palliaitve care closely following for goals of care.        Pressure Injury 10/10/21 Head Posterior Stage 2 -  Partial thickness loss of dermis presenting as a shallow open injury with a red, pink wound bed without slough. (Active)  10/10/21   Location: Head  Location Orientation: Posterior  Staging: Stage 2 -  Partial thickness loss of dermis presenting as a shallow open injury with a red, pink wound bed without slough.  Wound Description (Comments):   Present on Admission: Yes  Dressing Type None 10/14/21 2000    DVT prophylaxis:heparin injection 5,000 Units Start: 10/10/21 1400 SCDs Start: 10/08/21 1541     Code Status: Full Code  Family Communication: Brother at the bedside.Called and discussed with daughter on phone on 6/4  Patient status:Inpatient  Patient is from :Home  Anticipated discharge to: Skilled nursing facility  Estimated DC date: Not sure, waiting improvement in the renal function, mental status, waiting for recommendations from neurosurgery for vertebral fracture  Consultants: neurosurgery,nephrology  Procedures:None  Antimicrobials:  Anti-infectives (From admission, onward)    None       Subjective:  Patient seen and examined at the bedside this morning.  He just came from MRI.  He is hemodynamically stable.  He appears drowsy sleepy.  Opens his eyes on calling his name. Moaning  Objective: Vitals:   10/14/21 2059 10/15/21 0500 10/15/21 0502 10/15/21 0953  BP: 133/63  138/61 (!) 133/56  Pulse:  72  71 72  Resp: '19  18 16  '$ Temp: 99.4 F (37.4 C)  99.1 F (37.3 C) (!) 97.5 F (36.4 C)  TempSrc: Oral  Oral Oral  SpO2: 100%  96% 96%  Weight:  83.1 kg    Height:        Intake/Output Summary (Last 24 hours) at 10/15/2021 1049 Last data filed at 10/15/2021 0501 Gross per 24 hour  Intake 240 ml  Output 1500 ml  Net -1260 ml   Filed Weights   10/08/21 1542 10/14/21 0700 10/15/21 0500  Weight: 83.6 kg 86.3 kg 83.1 kg    Examination:      General exam: moaning with pain, lying in bed, deconditioned HEENT: PERRL Respiratory system:  no wheezes or crackles  Cardiovascular system: S1 & S2 heard, RRR.  Gastrointestinal system: Abdomen is nondistended, soft and nontender. Central nervous system: Not alert or oriented Extremities: No edema, no clubbing ,no cyanosis Skin: No rashes, no ulcers,no icterus     Data Reviewed: I have personally reviewed following labs and imaging studies  CBC: Recent Labs  Lab 10/08/21 1115 10/09/21 0611 10/10/21 0610 10/14/21 0340 10/15/21 0051  WBC 15.6* 12.5* 13.1* 14.7* 14.0*  NEUTROABS 13.2*  --   --   --   --   HGB 11.2* 10.3* 9.8* 9.5* 8.9*  HCT 34.3* 32.9* 31.7* 28.6* 27.5*  MCV 86.4 87.7 88.5 81.9 83.3  PLT 127* 113* 122* 215 606   Basic Metabolic Panel: Recent Labs  Lab 10/11/21 0625 10/12/21 0524 10/13/21 0956 10/14/21  0340 10/15/21 0051  NA 139 142 144 148* 152*  K 5.3* 4.5 3.8 3.7 3.5  CL 108 112* 113* 118* 123*  CO2 19* 21* 18* 20* 20*  GLUCOSE 111* 133* 134* 233* 202*  BUN 74* 77* 75* 73* 72*  CREATININE 4.22* 4.40* 4.10* 4.00* 4.19*  CALCIUM 8.3* 8.1* 8.1* 8.1* 8.2*     Recent Results (from the past 240 hour(s))  Resp Panel by RT-PCR (Flu A&B, Covid) Anterior Nasal Swab     Status: None   Collection Time: 10/08/21 11:09 AM   Specimen: Anterior Nasal Swab  Result Value Ref Range Status   SARS Coronavirus 2 by RT PCR NEGATIVE NEGATIVE Final    Comment: (NOTE) SARS-CoV-2 target nucleic acids are NOT  DETECTED.  The SARS-CoV-2 RNA is generally detectable in upper respiratory specimens during the acute phase of infection. The lowest concentration of SARS-CoV-2 viral copies this assay can detect is 138 copies/mL. A negative result does not preclude SARS-Cov-2 infection and should not be used as the sole basis for treatment or other patient management decisions. A negative result may occur with  improper specimen collection/handling, submission of specimen other than nasopharyngeal swab, presence of viral mutation(s) within the areas targeted by this assay, and inadequate number of viral copies(<138 copies/mL). A negative result must be combined with clinical observations, patient history, and epidemiological information. The expected result is Negative.  Fact Sheet for Patients:  EntrepreneurPulse.com.au  Fact Sheet for Healthcare Providers:  IncredibleEmployment.be  This test is no t yet approved or cleared by the [place] FDA and  has been authorized for detection and/or diagnosis of SARS-CoV-2 by FDA under an Emergency Use Authorization (EUA). This EUA will remain  in effect (meaning this test can be used) for the duration of the COVID-19 declaration under Section 564(b)(1) of the Act, 21 U.S.C.section 360bbb-3(b)(1), unless the authorization is terminated  or revoked sooner.       Influenza A by PCR NEGATIVE NEGATIVE Final   Influenza B by PCR NEGATIVE NEGATIVE Final    Comment: (NOTE) The Xpert Xpress SARS-CoV-2/FLU/RSV plus assay is intended as an aid in the diagnosis of influenza from Nasopharyngeal swab specimens and should not be used as a sole basis for treatment. Nasal washings and aspirates are unacceptable for Xpert Xpress SARS-CoV-2/FLU/RSV testing.  Fact Sheet for Patients: EntrepreneurPulse.com.au  Fact Sheet for Healthcare Providers: IncredibleEmployment.be  This test is not yet  approved or cleared by the [place] FDA and has been authorized for detection and/or diagnosis of SARS-CoV-2 by FDA under an Emergency Use Authorization (EUA). This EUA will remain in effect (meaning this test can be used) for the duration of the COVID-19 declaration under Section 564(b)(1) of the Act, 21 U.S.C. section 360bbb-3(b)(1), unless the authorization is terminated or revoked.  Performed at [facility], [place] [address] [place], [place] [address]      Radiology Studies: DG Lumbar Spine 2-3 Views  Result Date: 10/14/2021 CLINICAL DATA:  Fall, weakness EXAM: LUMBAR SPINE - 2-3 VIEW COMPARISON:  CT 10/08/2021 FINDINGS: Five lumbar type vertebral segments. Vertebral body heights are maintained. Previously described fractured osteophytes at L2 and L4, better seen on recent CT. No change in alignment. Disc heights are preserved. Bones are demineralized. Aortic atherosclerosis. IMPRESSION: Previously described fractured osteophytes at L2 and L4, better seen on recent CT. No change in alignment. No new findings. Electronically Signed   By: Davina Poke D.O.   On: 10/14/2021 16:17   MR LUMBAR SPINE WO  CONTRAST  Result Date: 10/15/2021 CLINICAL DATA:  81 year old male with recent fall. Back pain. L2 and L4 anterior superior endplate/osteophyte fractures in the setting of flowing endplate osteophytes and ankylosis on lumbar spine CT last month. Pain. Weakness. Large liver mass. EXAM: MRI LUMBAR SPINE WITHOUT CONTRAST TECHNIQUE: Multiplanar, multisequence MR imaging of the lumbar spine was performed. No intravenous contrast was administered. COMPARISON:  CT lumbar spine 10/08/2021. Abdomen MRI 10/09/2021. FINDINGS: Segmentation:  Normal on the comparison. Alignment: Lumbar lordosis is stable and relatively preserved. No significant spondylolisthesis. Vertebrae: Fairly subtle marrow edema at the anterior superior L2 endplate associated with the recent CT finding. At L4, the  dominant finding is a large tear of the L3-L4 disc, oblique on series 16, image 7. This tracks toward the L4 osteophyte fractures seen recently by CT. There is also a nondisplaced fracture of the tip of the L4 spinous process evident on series 15, image 8. No other marrow edema or acute osseous abnormality identified. Intact visible sacrum and SI joints. Normal background bone marrow signal. Conus medullaris and cauda equina: Conus extends to the T12-L1 level. No lower spinal cord or conus signal abnormality. Normal cauda equina nerve roots, with capacious spinal canal at most levels. Paraspinal and other soft tissues: Absent right kidney again noted. Large right liver mass partially visible, see abdomen MRI 10/09/2021. Disc levels: Visible lower thoracic levels through L2-L3 are negative for age, with underlying interbody ankylosis via flowing endplate osteophytes (traumatically interrupted at L1-L2 as seen on 10/08/2021. L3-L4: Long oblique tear of the disc tracking to the fractured anterior superior L4 osteophyte (series 15, image 7). Left far lateral broad-based disc bulge/protrusion (series 16, image 13). But no associated spinal stenosis. There is mild to moderate left L3 foraminal stenosis. L4-L5: Circumferential disc bulge with endplate spurring. Prominent left far lateral component here also. Mild to moderate facet and ligament flavum hypertrophy. Trace degenerative facet joint fluid. Mild spinal stenosis. Mild to moderate bilateral lateral recess stenosis (L5 nerve levels). Moderate to severe bilateral L4 foraminal stenosis. L5-S1: Negative disc.  Mild endplate spurring.  No stenosis. IMPRESSION: 1. Underlying lower thoracic and upper lumbar interbody ankylosis from flowing endplate osteophytes. Acute fractures of the anterior superior endplates/osteophytes at both L2 and L4 are better demonstrated on the recent CT. But this exam demonstrates a long tear of the L3-L4 disc, and a nondisplaced fracture of  the L4 spinous process, in association with the L4 injury. No subsequent spinal stenosis at that level but bulky leftward/lateral disc herniation is associated with up to moderate left L3 neural foraminal stenosis. 2. Disc and posterior element degeneration at L4-L5 with mild spinal stenosis, mild to moderate bilateral lateral recess stenosis, and moderate to severe bilateral L4 foraminal stenosis. 3. Large right Liver mass, see Abdomen MRI 10/09/2021. Electronically Signed   By: Genevie Ann M.D.   On: 10/15/2021 09:46    Scheduled Meds:  aspirin  81 mg Oral Daily   Chlorhexidine Gluconate Cloth  6 each Topical Q0600   dorzolamide-timolol  1 drop Both Eyes BID   heparin injection (subcutaneous)  5,000 Units Subcutaneous Q8H   insulin aspart  0-15 Units Subcutaneous TID WC   isosorbide dinitrate  20 mg Oral TID   latanoprost  1 drop Both Eyes QHS   lidocaine  1 patch Transdermal Q24H   mupirocin cream   Topical BID   nebivolol  10 mg Oral Daily   pantoprazole (PROTONIX) IV  40 mg Intravenous Daily   rosuvastatin  20 mg  Oral QHS   Continuous Infusions:     LOS: 7 days   Shelly Coss, MD [facility] P6/09/2021, 10:49 AM

## 2021-10-15 NOTE — Progress Notes (Signed)
Patient ID: IRWIN TORAN, male   DOB: [birth year], 81 y.o.   MRN: [id number] S: Somnolent after pain medications and nonverbal O:BP (!) 133/56 (BP Location: Left Arm)   Pulse 72   Temp (!) 97.5 F (36.4 C) (Oral)   Resp 16   Ht 6' (1.829 m)   Wt 83.1 kg   SpO2 96%   BMI 24.85 kg/m   Intake/Output Summary (Last 24 hours) at 10/15/2021 1415 Last data filed at 10/15/2021 0501 Gross per 24 hour  Intake 240 ml  Output 1250 ml  Net -1010 ml   Intake/Output: I/O last 3 completed shifts: In: 240 [P.O.:240] Out: 2025 [Urine:2025]  Intake/Output this shift:  No intake/output data recorded. Weight change: -3.2 kg YHC:WCBJSEGB comfortably in bed CVS:RRR Resp:CTA Abd: +BS, soft, NT/ND Ext: no edema  Recent Labs  Lab 10/09/21 0611 10/10/21 0610 10/11/21 0625 10/12/21 0524 10/13/21 0956 10/14/21 0340 10/15/21 0051  NA 141 142 139 142 144 148* 152*  K 4.6 4.7 5.3* 4.5 3.8 3.7 3.5  CL 111 112* 108 112* 113* 118* 123*  CO2 22 22 19* 21* 18* 20* 20*  GLUCOSE 119* 81 111* 133* 134* 233* 202*  BUN 49* 63* 74* 77* 75* 73* 72*  CREATININE 3.00* 3.73* 4.22* 4.40* 4.10* 4.00* 4.19*  ALBUMIN 2.5* 2.3*  --   --   --  1.6*  --   CALCIUM 8.1* 8.3* 8.3* 8.1* 8.1* 8.1* 8.2*  AST 118* 142*  --   --   --  145*  --   ALT 69* 82*  --   --   --  73*  --    Liver Function Tests: Recent Labs  Lab 10/09/21 0611 10/10/21 0610 10/14/21 0340  AST 118* 142* 145*  ALT 69* 82* 73*  ALKPHOS 299* 290* 603*  BILITOT 1.1 1.3* 1.2  PROT 5.9* 5.8* 5.4*  ALBUMIN 2.5* 2.3* 1.6*   No results for input(s): LIPASE, AMYLASE in the last 168 hours. Recent Labs  Lab 10/10/21 0610  AMMONIA 13   CBC: Recent Labs  Lab 10/09/21 0611 10/10/21 0610 10/14/21 0340 10/15/21 0051  WBC 12.5* 13.1* 14.7* 14.0*  HGB 10.3* 9.8* 9.5* 8.9*  HCT 32.9* 31.7* 28.6* 27.5*  MCV 87.7 88.5 81.9 83.3  PLT 113* 122* 215 224   Cardiac Enzymes: Recent Labs  Lab 10/09/21 0611 10/10/21 0610 10/11/21 0625  CKTOTAL  1,267* 592* 327   CBG: Recent Labs  Lab 10/14/21 1147 10/14/21 1559 10/14/21 1954 10/15/21 0746 10/15/21 1138  GLUCAP 212* 138* 236* 180* 218*    Iron Studies: No results for input(s): IRON, TIBC, TRANSFERRIN, FERRITIN in the last 72 hours. Studies/Results: DG Lumbar Spine 2-3 Views  Result Date: 10/14/2021 CLINICAL DATA:  Fall, weakness EXAM: LUMBAR SPINE - 2-3 VIEW COMPARISON:  CT 10/08/2021 FINDINGS: Five lumbar type vertebral segments. Vertebral body heights are maintained. Previously described fractured osteophytes at L2 and L4, better seen on recent CT. No change in alignment. Disc heights are preserved. Bones are demineralized. Aortic atherosclerosis. IMPRESSION: Previously described fractured osteophytes at L2 and L4, better seen on recent CT. No change in alignment. No new findings. Electronically Signed   By: Davina Poke D.O.   On: 10/14/2021 16:17   MR LUMBAR SPINE WO CONTRAST  Result Date: 10/15/2021 CLINICAL DATA:  81 year old male with recent fall. Back pain. L2 and L4 anterior superior endplate/osteophyte fractures in the setting of flowing endplate osteophytes and ankylosis on lumbar spine CT last month. Pain. Weakness. Large liver mass.  EXAM: MRI LUMBAR SPINE WITHOUT CONTRAST TECHNIQUE: Multiplanar, multisequence MR imaging of the lumbar spine was performed. No intravenous contrast was administered. COMPARISON:  CT lumbar spine 10/08/2021. Abdomen MRI 10/09/2021. FINDINGS: Segmentation:  Normal on the comparison. Alignment: Lumbar lordosis is stable and relatively preserved. No significant spondylolisthesis. Vertebrae: Fairly subtle marrow edema at the anterior superior L2 endplate associated with the recent CT finding. At L4, the dominant finding is a large tear of the L3-L4 disc, oblique on series 16, image 7. This tracks toward the L4 osteophyte fractures seen recently by CT. There is also a nondisplaced fracture of the tip of the L4 spinous process evident on series 15,  image 8. No other marrow edema or acute osseous abnormality identified. Intact visible sacrum and SI joints. Normal background bone marrow signal. Conus medullaris and cauda equina: Conus extends to the T12-L1 level. No lower spinal cord or conus signal abnormality. Normal cauda equina nerve roots, with capacious spinal canal at most levels. Paraspinal and other soft tissues: Absent right kidney again noted. Large right liver mass partially visible, see abdomen MRI 10/09/2021. Disc levels: Visible lower thoracic levels through L2-L3 are negative for age, with underlying interbody ankylosis via flowing endplate osteophytes (traumatically interrupted at L1-L2 as seen on 10/08/2021. L3-L4: Long oblique tear of the disc tracking to the fractured anterior superior L4 osteophyte (series 15, image 7). Left far lateral broad-based disc bulge/protrusion (series 16, image 13). But no associated spinal stenosis. There is mild to moderate left L3 foraminal stenosis. L4-L5: Circumferential disc bulge with endplate spurring. Prominent left far lateral component here also. Mild to moderate facet and ligament flavum hypertrophy. Trace degenerative facet joint fluid. Mild spinal stenosis. Mild to moderate bilateral lateral recess stenosis (L5 nerve levels). Moderate to severe bilateral L4 foraminal stenosis. L5-S1: Negative disc.  Mild endplate spurring.  No stenosis. IMPRESSION: 1. Underlying lower thoracic and upper lumbar interbody ankylosis from flowing endplate osteophytes. Acute fractures of the anterior superior endplates/osteophytes at both L2 and L4 are better demonstrated on the recent CT. But this exam demonstrates a long tear of the L3-L4 disc, and a nondisplaced fracture of the L4 spinous process, in association with the L4 injury. No subsequent spinal stenosis at that level but bulky leftward/lateral disc herniation is associated with up to moderate left L3 neural foraminal stenosis. 2. Disc and posterior element  degeneration at L4-L5 with mild spinal stenosis, mild to moderate bilateral lateral recess stenosis, and moderate to severe bilateral L4 foraminal stenosis. 3. Large right Liver mass, see Abdomen MRI 10/09/2021. Electronically Signed   By: Genevie Ann M.D.   On: 10/15/2021 09:46    aspirin  81 mg Oral Daily   Chlorhexidine Gluconate Cloth  6 each Topical Q0600   dorzolamide-timolol  1 drop Both Eyes BID   heparin injection (subcutaneous)  5,000 Units Subcutaneous Q8H   insulin aspart  0-15 Units Subcutaneous TID WC   insulin glargine-yfgn  5 Units Subcutaneous Daily   isosorbide dinitrate  20 mg Oral TID   latanoprost  1 drop Both Eyes QHS   lidocaine  1 patch Transdermal Q24H   mupirocin cream   Topical BID   nebivolol  10 mg Oral Daily   pantoprazole (PROTONIX) IV  40 mg Intravenous Daily   rosuvastatin  20 mg Oral QHS    BMET    Component Value Date/Time   NA 152 (H) 10/15/2021 0051   NA 141 11/20/2012 1028   K 3.5 10/15/2021 0051   K 4.2 11/20/2012  1028   CL 123 (H) 10/15/2021 0051   CL 104 10/26/2012 1011   CO2 20 (L) 10/15/2021 0051   CO2 25 11/20/2012 1028   GLUCOSE 202 (H) 10/15/2021 0051   GLUCOSE 143 (H) 11/20/2012 1028   GLUCOSE 304 (H) 10/26/2012 1011   BUN 72 (H) 10/15/2021 0051   BUN 25.4 11/20/2012 1028   CREATININE 4.19 (H) 10/15/2021 0051   CREATININE 1.6 (H) 11/20/2012 1028   CALCIUM 8.2 (L) 10/15/2021 0051   CALCIUM 9.3 11/20/2012 1028   GFRNONAA 13 (L) 10/15/2021 0051   GFRAA 34 (L) 12/02/2018 0528   CBC    Component Value Date/Time   WBC 14.0 (H) 10/15/2021 0051   RBC 3.30 (L) 10/15/2021 0051   HGB 8.9 (L) 10/15/2021 0051   HGB 14.7 10/26/2012 1011   HCT 27.5 (L) 10/15/2021 0051   HCT 43.2 10/26/2012 1011   PLT 224 10/15/2021 0051   PLT 167 10/26/2012 1011   MCV 83.3 10/15/2021 0051   MCV 89.7 10/26/2012 1011   MCH 27.0 10/15/2021 0051   MCHC 32.4 10/15/2021 0051   RDW 16.7 (H) 10/15/2021 0051   RDW 13.6 10/26/2012 1011   LYMPHSABS 0.6 (L)  10/08/2021 1115   LYMPHSABS 1.7 10/26/2012 1011   MONOABS 1.7 (H) 10/08/2021 1115   MONOABS 0.6 10/26/2012 1011   EOSABS 0.0 10/08/2021 1115   EOSABS 0.2 10/26/2012 1011   BASOSABS 0.0 10/08/2021 1115   BASOSABS 0.1 10/26/2012 1011    Assessment/ Plan: AKI on CKD IV - b/l creat 3.1- 3.7, eGFR 15-20.  Creat 3.1 on admission 5/29 in setting of fall, L2 fracture and mild/ mod acute rhabdo.  Pt f/b Dr Candiss Norse at [facility]. Abd [place] > absent R kidney, L kidney w/o obstruction. UA showed myoglobin (low rbc/ high Hb). Suspect mild rhabdomyolysis-related ATN as cause of AKI. Creat peaked at 4.4 then improved down to 4.0 yesterday but up to 4.19 today.  Has not been eating or drinking for past 48 hours and likely has some pre-renal component.  Will start IVF's and follow UOP and Scr.  Not a suitable candidate for dialysis and recommend palliative care consult to help set goals/limits of care as he is still full code.  SP fall / L2 fracture - found down at home, acute back pain is signicant issue still  Hypernatremia - due to poor po intake and consistent with dehydration.  Will start 1/2 NS and follow.  Hx of MGUS - serum free LC's, SPEP and UPEP all are pending still HTN - getting BB and norvasc, BP's good. Avoid hypotension, keep SBP > 115-120 please w/ AKI Volume - mild edema, CXR 5/29 neg. Good UOP, has condom cath now. DC ivf MGUS - as above Liver mass - per pmd DM2  Donetta Potts, MD [facility]

## 2021-10-15 NOTE — Consult Note (Signed)
CC: lumbar fracture  HPI:     Patient is a 81 y.o. 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  Taken to the hospital where he was diagnosed with rhabdomyolysis with worsened kidney function.  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  TLSO brace given, but patient has not been able to tolerate being out of bed.  MRI showed ALL tear and disc injury of L3-4, mild posterior ligamentous complex edema.   Patient Active Problem List   Diagnosis Date Noted   Pressure injury of skin 10/11/2021   Normal anion gap metabolic acidosis 25/63/8937   Fall at home, initial encounter 10/08/2021   Closed fracture of lumbar spine without lesion of spinal cord ([place]) 10/08/2021   GERD (gastroesophageal reflux disease) 10/08/2021   Elevated LFTs 10/08/2021   Rhabdomyolysis 10/08/2021   AKI (acute kidney injury) ([place]) 11/29/2018   Urinary tract obstruction by kidney stone 11/29/2018   Type 2 diabetes mellitus with hyperlipidemia ([place]) 11/29/2018   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   Post PTCA 10/22/2016   Coronary artery disease of native artery of native heart with stable angina pectoris ([place]) 10/20/2016   NSTEMI (non-ST elevated myocardial infarction) ([place]) 09/25/2016   MGUS (monoclonal gammopathy of unknown significance)    Primary hypertension    CKD (chronic kidney disease) stage 4, GFR 15-29 ml/min (HCC)    HLD (hyperlipidemia)    Borderline diabetes    Seborrheic keratosis    Past Medical History:  Diagnosis Date   Arthritis    "little in my right hand; some in my right big toe" (10/22/2016)   Borderline diabetes    "tx'd w/RX; blood surgars dropped too low; dr. 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 at [facility] in [place] ([place]) 2006   s/p brachytherapy   Seborrheic keratosis    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   history of nephrectomy in 1984    Past Surgical History:  Procedure Laterality Date   [place]  09/26/2016; 10/22/2016   "4 stents; 2 stents"   CORONARY STENT INTERVENTION Right 09/26/2016   Procedure: Coronary Stent Intervention;  Surgeon: Adrian Prows, MD;  Location: [place] [facility];  Service: Cardiovascular;  Laterality: Right;   CORONARY STENT INTERVENTION N/A 10/22/2016   Procedure: Coronary Stent Intervention;  Surgeon: Adrian Prows, MD;  Location: [place] [facility];  Service: Cardiovascular;  Laterality: N/A;   CYSTOSCOPY/URETEROSCOPY/HOLMIUM LASER/STENT PLACEMENT Left 11/29/2018   Procedure: CYSTOSCOPY/URETEROSCOPY/HOLMIUM LASER/STENT PLACEMENTleft retrograde pylegram;  Surgeon: Ardis Hughs, MD;  Location: [facility];  Service: Urology;  Laterality: Left;   HOLMIUM LASER APPLICATION  3/42/8768   Procedure: HOLMIUM LASER APPLICATION;  Surgeon: Ardis Hughs, MD;  Location: [facility];  Service: Urology;;   INSERTION PROSTATE  RADIATION SEED  2009?   LEFT HEART CATH AND CORONARY ANGIOGRAPHY N/A 09/26/2016   Procedure: Left Heart Cath and Coronary Angiography;  Surgeon: Adrian Prows, MD;  Location: [place] [facility];  Service: Cardiovascular;  Laterality: N/A;   NEPHRECTOMY Right [place]  04/2016   "tore all the ligaments; had to put 3 pins in to reattach; Dr. Gladstone Lighter"   TONSILLECTOMY      Medications Prior to Admission  Medication Sig Dispense Refill  Last Dose   amLODipine (NORVASC) 2.5 MG tablet Take 2.5 mg by mouth daily.   10/07/2021   aspirin EC 81 MG tablet Take 81 mg by mouth daily.   0/48/8891   BYSTOLIC 20 MG TABS Take 10 mg by mouth daily. daily   10/07/2021 at 0900   dorzolamide-timolol (COSOPT) 22.3-6.8 MG/ML ophthalmic solution Place 1 drop into both eyes 2 (two) times daily.   10/07/2021   isosorbide mononitrate (IMDUR) 60 MG 24 hr tablet Take 1 tablet (60 mg total) by mouth daily. 90 tablet 3 10/07/2021   latanoprost (XALATAN) 0.005 % ophthalmic solution Place 1 drop into both eyes at bedtime.   10/07/2021   nitroGLYCERIN (NITROSTAT) 0.4 MG SL tablet Place 1 tablet (0.4 mg total) under the tongue every 5 (five) minutes x 3 doses as needed for chest pain. 25 tablet 1 unk   pantoprazole (PROTONIX) 40 MG tablet Take 40 mg by mouth daily.   10/07/2021   rosuvastatin (CRESTOR) 20 MG tablet Take 1 tablet (20 mg total) by mouth at bedtime. 90 tablet 3 10/07/2021   TRESIBA FLEXTOUCH 100 UNIT/ML FlexTouch Pen Inject 10 Units into the skin daily.   10/07/2021   Finerenone (KERENDIA) 10 MG TABS Take 1 tablet by mouth daily. (Patient not taking: Reported on 10/08/2021) 30 tablet 6 Not Taking   Allergies  Allergen Reactions   Sulfa Antibiotics Hives   Morphine And Related Other (See Comments)    Spontaneous body movements   Lisinopril Cough    Social History   Tobacco Use   Smoking status: Never   Smokeless tobacco: Never  Substance Use Topics   Alcohol use: Not Currently    Comment: very little     Family History  Problem Relation Age of Onset   Diabetes Mother    Heart attack Father      Objective:   Patient Vitals for the past 8 hrs:  BP Temp Temp src Pulse Resp SpO2  10/15/21 1701 (!) 138/59 97.6 F (36.4 C) Oral 76 18 96 %  10/15/21 0953 (!) 133/56 (!) 97.5 F (36.4 C) Oral 72 16 96 %   I/O last 3 completed shifts: In: 240 [P.O.:240] Out: 2025 [Urine:2025] Total I/O In: -  Out: 450 [Urine:450]    Awake, alert,  NAD Lying flat in bed Confused, oriented to person only FC x 4 Full strength x 4   Data ReviewCBC:  Lab Results  Component Value Date   WBC 14.0 (H) 10/15/2021   RBC 3.30 (L) 10/15/2021   BMP:  Lab Results  Component Value Date   GLUCOSE 202 (H) 10/15/2021   GLUCOSE 143 (H) 11/20/2012   GLUCOSE 304 (H) 10/26/2012   CHLORIDE 108 11/20/2012   CO2 20 (L) 10/15/2021   CO2 25 11/20/2012   BUN 72 (H) 10/15/2021   BUN 25.4 11/20/2012   CREATININE 4.19 (H) 10/15/2021   CREATININE 1.6 (H) 11/20/2012   CALCIUM 8.2 (L) 10/15/2021   CALCIUM 9.3 11/20/2012  Radiology review:  See HPI for details  Assessment:   Principal Problem:   AKI (acute kidney injury) ([place]) Active Problems:   MGUS (monoclonal gammopathy of unknown significance)   Primary hypertension   CKD (chronic kidney disease) stage 4, GFR 15-29 ml/min (HCC)   HLD (hyperlipidemia)   Coronary artery disease of native artery of native heart with stable angina pectoris (HCC)   Type 2 diabetes mellitus with hyperlipidemia ([place])   Fall at home, initial encounter   Closed fracture of lumbar spine without lesion of spinal cord (HCC)   GERD (gastroesophageal reflux disease)   Elevated LFTs   Rhabdomyolysis   Pressure injury of skin   Normal anion gap metabolic acidosis  Unstable L3-4 hyperextension injury with anterior osteophyte fracture and osteopenia. Unfortunately, he has not tolerated brace therapy.  Plan:  - I had a long discussion with the patient's wife and brother at the bedside and discussed treatment options.  As he does not appear to be tolerating brace therapy well, surgical fixation would likely give him the best chance of improving his mobility and ambulating.  He has significant underlying medical comorbidities and with his osteopenia and DISH, is at elevated risk of surgical failure.   Nevertheless, a percutaneous fusion surgery with cement augmentation carries a favorable risk/benefit profile in his  situation. Alternatives of bed rest with medical pain control was discussed and would be appropriate if they were proceeding with palliative care, but surgery could provide good palliation and improvement in mobility as well.  I discussed the general technique of surgery, risks, benefits, alternatives, and expected convalescence.  They wished to proceed.  Informed consent was obtained.

## 2021-10-15 NOTE — Progress Notes (Signed)
Occupational Therapy Treatment Patient Details Name: [name] MRN: [id number] DOB: 12-Sep-1940 Today's Date: 10/15/2021   History of present illness 81 y.o. male with medical history significant of MGUS, CKD4 s/p right nephrectomy, HTN, HLD, CAD.  Patient presented after sustaining a fall at home.  Could not get up from the floor for several hours.  Was brought by EMS.  Found to have rhabdomyolysis. CT reveals liver Mass.   Also found to have fracture involving the L2 vertebrae.  TLSO brace to room.   OT comments  Pt with lethargy, having been given Dilaudid for earlier MRI, but able to keep eyes open when seated EOB. Pt minimally verbal other than to say "no" with any imposed movement. Pt moaning with all bed mobility and positioning once he was assisted to supine and then sidelying with pillows placed for pressure relief. Pt requiring +2 total assist for all mobility. Tolerated supported sitting x 20 minutes. Pt required total assist for ice chips with spoon. Did not observe pt to swallow. Mug with lid provided, pt not able to maintain grasp. Brother in room and supportive. SNF level rehab remains appropriate.   Recommendations for follow up therapy are one component of a multi-disciplinary discharge planning process, led by the attending physician.  Recommendations may be updated based on patient status, additional functional criteria and insurance authorization.    Follow Up Recommendations  Skilled nursing-short term rehab (<3 hours/day)    Assistance Recommended at Discharge Frequent or constant Supervision/Assistance  Patient can return home with the following  Two people to help with walking and/or transfers;A lot of help with walking and/or transfers;A lot of help with bathing/dressing/bathroom;Two people to help with bathing/dressing/bathroom;Assistance with cooking/housework;Assist for transportation;Direct supervision/assist for financial management;Direct supervision/assist for  medications management;Help with stairs or ramp for entrance;Assistance with feeding   Equipment Recommendations  Other (comment) (defer to next venue)    Recommendations for Other Services      Precautions / Restrictions Precautions Precautions: Back;Fall Required Braces or Orthoses: Spinal Brace Spinal Brace: Thoracolumbosacral orthotic Other Brace: may remove for showering Restrictions Other Position/Activity Restrictions: per brother, Dr Christella Noa stated pt did not need back brace, will clarify       Mobility Bed Mobility Overal bed mobility: Needs Assistance Bed Mobility: Rolling, Sidelying to Sit, Sit to Sidelying Rolling: +2 for physical assistance, Total assist Sidelying to sit: +2 for physical assistance, Total assist     Sit to sidelying: +2 for physical assistance, Total assist General bed mobility comments: assisted for all aspects    Transfers                   General transfer comment: deferred due to lethargy     Balance Overall balance assessment: History of Falls, Needs assistance Sitting-balance support: Bilateral upper extremity supported, Feet supported Sitting balance-Leahy Scale: Poor Sitting balance - Comments: min to mod assist x 15 minutes Postural control: Posterior lean                                 ADL either performed or assessed with clinical judgement   ADL Overall ADL's : Needs assistance/impaired Eating/Feeding: Total assistance;Sitting Eating/Feeding Details (indicate cue type and reason): spoon used for ice chips, did not observe pt to swallow  Extremity/Trunk Assessment              Vision       Perception     Praxis      Cognition Arousal/Alertness: Lethargic, Awake/alert (awake when seated EOB, closed eyes in supine) Behavior During Therapy: Anxious Overall Cognitive Status: Impaired/Different from baseline Area of Impairment: Following  commands                       Following Commands: Follows one step commands with increased time, Follows one step commands inconsistently       General Comments: Pt received Dilaudid prior to MRI this morning, may have contributed to pt's inability to follow commands and minimal verbalization.        Exercises      Shoulder Instructions       General Comments      Pertinent Vitals/ Pain       Pain Assessment Pain Assessment: Faces Faces Pain Scale: Hurts whole lot Pain Location: generalized Pain Descriptors / Indicators: Grimacing, Guarding, Moaning, Discomfort Pain Intervention(s): Monitored during session, Premedicated before session, Repositioned  Home Living                                          Prior Functioning/Environment              Frequency  Min 2X/week        Progress Toward Goals  OT Goals(current goals can now be found in the care plan section)  Progress towards OT goals: Not progressing toward goals - comment  Acute Rehab OT Goals OT Goal Formulation: With family Time For Goal Achievement: 10/23/21 Potential to Achieve Goals: [place] Discharge plan remains appropriate    Co-evaluation    PT/OT/SLP Co-Evaluation/Treatment: Yes Reason for Co-Treatment: Complexity of the patient's impairments (multi-system involvement)   OT goals addressed during session: Strengthening/ROM;ADL's and self-care      AM-PAC OT "6 Clicks" Daily Activity     Outcome Measure   Help from another person eating meals?: Total Help from another person taking care of personal grooming?: Total Help from another person toileting, which includes using toliet, bedpan, or urinal?: Total Help from another person bathing (including washing, rinsing, drying)?: Total Help from another person to put on and taking off regular upper body clothing?: Total Help from another person to put on and taking off regular lower body clothing?:  Total 6 Click Score: 6    End of Session    OT Visit Diagnosis: Unsteadiness on feet (R26.81);History of falling (Z91.81);Muscle weakness (generalized) (M62.81);Pain;Other symptoms and signs involving cognitive function;Feeding difficulties (R63.3)   Activity Tolerance Patient limited by pain;Patient limited by lethargy   Patient Left in bed;with call bell/phone within reach;with family/visitor present   Nurse Communication Mobility status;Other (comment) (dressed skin tear on back)        Time: 6568-1275 OT Time Calculation (min): 41 min  Charges: OT General Charges $OT Visit: 1 Visit OT Treatments $Therapeutic Activity: 8-22 mins  Nestor Lewandowsky, OTR/L Acute Rehabilitation Services Pager: [phone] Office: [phone]   [name] 10/15/2021, 11:50 AM

## 2021-10-15 NOTE — Progress Notes (Incomplete)
Subjective: Patient reports {sub:[id number]}  Objective: Vital signs in last 24 hours: Temp:  [97.7 F (36.5 C)-99.4 F (37.4 C)] 99.1 F (37.3 C) (06/05 0502) Pulse Rate:  [68-78] 71 (06/05 0502) Resp:  [18-19] 18 (06/05 0502) BP: (133-152)/(61-70) 138/61 (06/05 0502) SpO2:  [96 %-100 %] 96 % (06/05 0502) Weight:  [83.1 kg] 83.1 kg (06/05 0500)  Intake/Output from previous day: 06/04 0701 - 06/05 0700 In: 240 [P.O.:240] Out: 1775 [Urine:1775] Intake/Output this shift: No intake/output data recorded.  {physical BLTJ:[id number]}  Lab Results: Recent Labs    10/14/21 0340 10/15/21 0051  WBC 14.7* 14.0*  HGB 9.5* 8.9*  HCT 28.6* 27.5*  PLT 215 224   BMET Recent Labs    10/14/21 0340 10/15/21 0051  NA 148* 152*  K 3.7 3.5  CL 118* 123*  CO2 20* 20*  GLUCOSE 233* 202*  BUN 73* 72*  CREATININE 4.00* 4.19*  CALCIUM 8.1* 8.2*    Studies/Results: DG Lumbar Spine 2-3 Views  Result Date: 10/14/2021 CLINICAL DATA:  Fall, weakness EXAM: LUMBAR SPINE - 2-3 VIEW COMPARISON:  CT 10/08/2021 FINDINGS: Five lumbar type vertebral segments. Vertebral body heights are maintained. Previously described fractured osteophytes at L2 and L4, better seen on recent CT. No change in alignment. Disc heights are preserved. Bones are demineralized. Aortic atherosclerosis. IMPRESSION: Previously described fractured osteophytes at L2 and L4, better seen on recent CT. No change in alignment. No new findings. Electronically Signed   By: Davina Poke D.O.   On: 10/14/2021 16:17    Assessment/Plan: ***  LOS: 7 days  {RAQT:[id number]}   Marvis Moeller, DNP, AGNP-C Neurosurgery Nurse Practitioner  [facility] [address], [address], [place], [place] [address] P: [phone]    F: [phone]  10/15/2021 8:10 AM

## 2021-10-15 NOTE — TOC Progression Note (Signed)
Transition of Care [facility]) - Initial/Assessment Note    Patient Details  Name: [name] MRN: [id number] Date of Birth: October 30, 1940  Transition of Care [facility]) CM/SW Contact:    Milinda Antis, [place] Phone Number: 10/15/2021, 12:32 PM  Clinical Narrative:                 CSW spoke with Claiborne Billings at [facility] SNF to inquire about whether they can still accept the patient when medically ready.  CSW was informed that the facility will not be able to accept the patient if dialysis is needed.  TOC will continue to follow.    Expected Discharge Plan: Skilled Nursing Facility Barriers to Discharge: Continued Medical Work up, SNF Pending bed offer, Insurance Authorization   Patient Goals and CMS Choice Patient states their goals for this hospitalization and ongoing recovery are:: to eventually return home with wife      Expected Discharge Plan and Services Expected Discharge Plan: [place] In-house Referral: Clinical Social Work   Post Acute Care Choice: [place] Living arrangements for the past 2 months: Single Family Home                 DME Arranged: N/A DME Agency: NA                  Prior Living Arrangements/Services Living arrangements for the past 2 months: Single Family Home     Do you feel safe going back to the place where you live?: Yes      Need for Family Participation in Patient Care: Yes (Comment) Care giver support system in place?: Yes (comment)      Activities of Daily Living Home Assistive Devices/Equipment: None ADL Screening (condition at time of admission) Patient's cognitive ability adequate to safely complete daily activities?: Yes Is the patient deaf or have difficulty hearing?: No Does the patient have difficulty seeing, even when wearing glasses/contacts?: No Does the patient have difficulty concentrating, remembering, or making decisions?: No Patient able to express need for assistance with ADLs?:  Yes Does the patient have difficulty dressing or bathing?: No Independently performs ADLs?: Yes (appropriate for developmental age) Does the patient have difficulty walking or climbing stairs?: Yes Weakness of Legs: Both Weakness of Arms/Hands: None  Permission Sought/Granted Permission sought to share information with : Family Supports Permission granted to share information with : Yes, Verbal Permission Granted  Share Information with NAME: Darsh Vandevoort     Permission granted to share info w Relationship: spouse  Permission granted to share info w Contact Information: [phone]  Emotional Assessment Appearance:: Appears stated age Attitude/Demeanor/Rapport: Lethargic Affect (typically observed): Flat Orientation: : Oriented to Self, Oriented to Place, Oriented to  Time, Oriented to Situation Alcohol / Substance Use: Not Applicable Psych Involvement: No (comment)  Admission diagnosis:  Elevated troponin [R77.8] Falls [W19.XXXA] Fall, initial encounter [id number]] Traumatic rhabdomyolysis, initial encounter ([place]) [T79.6XXA] Other closed fracture of second lumbar vertebra, initial encounter [facility]) [S32.028A] Patient Active Problem List   Diagnosis Date Noted   Pressure injury of skin 10/11/2021   Normal anion gap metabolic acidosis 44/05/270   Fall at home, initial encounter 10/08/2021   Closed fracture of lumbar spine without lesion of spinal cord ([place]) 10/08/2021   GERD (gastroesophageal reflux disease) 10/08/2021   Elevated LFTs 10/08/2021   Rhabdomyolysis 10/08/2021   AKI (acute kidney injury) ([place]) 11/29/2018   Urinary tract obstruction by kidney stone 11/29/2018   Type 2 diabetes mellitus with hyperlipidemia ([place]) 11/29/2018  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   Post PTCA 10/22/2016   Coronary artery disease of native artery of native heart with stable angina pectoris ([place]) 10/20/2016   NSTEMI (non-ST elevated myocardial infarction) ([place]) 09/25/2016   MGUS (monoclonal  gammopathy of unknown significance)    Primary hypertension    CKD (chronic kidney disease) stage 4, GFR 15-29 ml/min (HCC)    HLD (hyperlipidemia)    Borderline diabetes    Seborrheic keratosis    PCP:  Burnard Bunting, MD Pharmacy:   [facility], [place] [address] Phone: [phone] Fax: [phone]     Social Determinants of Health (SDOH) Interventions    Readmission Risk Interventions    10/09/2021    2:37 PM  Readmission Risk Prevention Plan  Transportation Screening Complete  PCP or Specialist Appt within 5-7 Days Complete  Home Care Screening Complete  Medication Review (RN CM) Complete

## 2021-10-15 NOTE — Progress Notes (Signed)
Inpatient Diabetes Program Recommendations  AACE/ADA: New Consensus Statement on Inpatient Glycemic Control (2015)  Target Ranges:  Prepandial:   less than 140 mg/dL      Peak postprandial:   less than 180 mg/dL (1-2 hours)      Critically ill patients:  140 - 180 mg/dL   Lab Results  Component Value Date   GLUCAP 180 (H) 10/15/2021   HGBA1C 8.8 (H) 10/08/2021    Review of Glycemic Control  Latest Reference Range & Units 10/14/21 07:15 10/14/21 11:47 10/14/21 15:59 10/14/21 19:54 10/15/21 07:46  Glucose-Capillary 70 - 99 mg/dL 219 (H) 212 (H) 138 (H) 236 (H) 180 (H)   Diabetes history: DM 2 Outpatient Diabetes medications: Tresiba 10 units Daily Current orders for Inpatient glycemic control:  Novolog 0-15 units tid  Inpatient Diabetes Program Recommendations:    -  Consider Semglee 5 units (1/2 home dose)  Thanks,  Tama Headings RN, MSN, BC-ADM Inpatient Diabetes Coordinator Team Pager [phone] (8a-5p)

## 2021-10-15 NOTE — Progress Notes (Signed)
Physical Therapy Treatment Patient Details Name: [name] MRN: [id number] DOB: 1940/06/27 Today's Date: 10/15/2021   History of Present Illness 81 y.o. male with medical history significant of MGUS, CKD4 s/p right nephrectomy, HTN, HLD, CAD.  Patient presented after sustaining a fall at home.  Could not get up from the floor for several hours.  Was brought by EMS.  Found to have rhabdomyolysis. CT reveals liver Mass.   Also found to have fracture involving the L2 vertebrae.  TLSO brace to room.    PT Comments    Continuing work on functional mobility and activity tolerance;  Pt with lethargy, having been given Dilaudid for earlier MRI, but able to keep eyes open when seated EOB. Pt minimally verbal other than to say "no" with any imposed movement. Pt moaning with all bed mobility and positioning once he was assisted to supine and then sidelying with pillows placed for pressure relief. Pt requiring +2 total assist for all mobility. Tolerated supported sitting x 20 minutes. Noted area of possible pressure injury at mid back to the R of spine, RN came, assessed, and dressed area  Recommendations for follow up therapy are one component of a multi-disciplinary discharge planning process, led by the attending physician.  Recommendations may be updated based on patient status, additional functional criteria and insurance authorization.  Follow Up Recommendations  Skilled nursing-short term rehab (<3 hours/day)     Assistance Recommended at Discharge Frequent or constant Supervision/Assistance  Patient can return home with the following Two people to help with walking and/or transfers;Two people to help with bathing/dressing/bathroom;Assistance with cooking/housework;Assist for transportation;Help with stairs or ramp for entrance   Equipment Recommendations  Rolling walker (2 wheels);Wheelchair (measurements PT);Wheelchair cushion (measurements PT);Hospital bed    Recommendations for Other  Services       Precautions / Restrictions Precautions Precautions: Back;Fall Required Braces or Orthoses: Spinal Brace Spinal Brace: Thoracolumbosacral orthotic Other Brace: may remove for showering Restrictions Other Position/Activity Restrictions: per brother, Dr Christella Noa stated pt did not need back brace, will clarify     Mobility  Bed Mobility Overal bed mobility: Needs Assistance Bed Mobility: Rolling, Sidelying to Sit, Sit to Sidelying Rolling: +2 for physical assistance, Total assist Sidelying to sit: +2 for physical assistance, Total assist     Sit to sidelying: +2 for physical assistance, Total assist General bed mobility comments: assisted for all aspects    Transfers                   General transfer comment: deferred due to lethargy    Ambulation/Gait                   Stairs             Wheelchair Mobility    Modified Rankin (Stroke Patients Only)       Balance Overall balance assessment: History of Falls, Needs assistance Sitting-balance support: Bilateral upper extremity supported, Feet supported Sitting balance-Leahy Scale: Poor Sitting balance - Comments: min to mod assist x 15 minutes Postural control: Posterior lean                                  Cognition Arousal/Alertness: Lethargic, Awake/alert (awake when seated EOB, closed eyes in supine) Behavior During Therapy: Anxious Overall Cognitive Status: Impaired/Different from baseline Area of Impairment: Following commands  Following Commands: Follows one step commands with increased time, Follows one step commands inconsistently       General Comments: Pt received Dilaudid prior to MRI this morning, may have contributed to pt's inability to follow commands and minimal verbalization.        Exercises      General Comments General comments (skin integrity, edema, etc.): Noted area of integumentary injury on back,  just lateral to spine, mid-back, on the Right; RN came to assess and placed foam dressing      Pertinent Vitals/Pain Pain Assessment Pain Assessment: Faces Faces Pain Scale: Hurts whole lot Pain Location: generalized Pain Descriptors / Indicators: Grimacing, Guarding, Moaning, Discomfort Pain Intervention(s): Monitored during session, Repositioned    Home Living                          Prior Function            PT Goals (current goals can now be found in the care plan section) Acute Rehab PT Goals PT Goal Formulation: With patient/family Time For Goal Achievement: 10/23/21 Potential to Achieve Goals: Fair Progress towards PT goals: Progressing toward goals (slowly)    Frequency    Min 2X/week      PT Plan Current plan remains appropriate;Equipment recommendations need to be updated    Co-evaluation PT/OT/SLP Co-Evaluation/Treatment: Yes Reason for Co-Treatment: Complexity of the patient's impairments (multi-system involvement) PT goals addressed during session: Mobility/safety with mobility OT goals addressed during session: Strengthening/ROM;ADL's and self-care      AM-PAC PT "6 Clicks" Mobility   Outcome Measure  Help needed turning from your back to your side while in a flat bed without using bedrails?: Total Help needed moving from lying on your back to sitting on the side of a flat bed without using bedrails?: Total Help needed moving to and from a bed to a chair (including a wheelchair)?: Total Help needed standing up from a chair using your arms (e.g., wheelchair or bedside chair)?: Total Help needed to walk in hospital room?: Total Help needed climbing 3-5 steps with a railing? : Total 6 Click Score: 6    End of Session Equipment Utilized During Treatment:  (bed pad) Activity Tolerance: Patient limited by pain Patient left: in bed;with call bell/phone within reach;with bed alarm set;with family/visitor present;Other (comment) (semi-right  sidelying) Nurse Communication: Mobility status PT Visit Diagnosis: Unsteadiness on feet (R26.81);Repeated falls (R29.6);Muscle weakness (generalized) (M62.81);Difficulty in walking, not elsewhere classified (R26.2);Pain     Time: 9628-3662 PT Time Calculation (min) (ACUTE ONLY): 39 min  Charges:  $Therapeutic Activity: 23-37 mins                     Roney Marion, PT  Acute Rehabilitation Services Office [phone]    Colletta [place] 10/15/2021, 2:25 PM

## 2021-10-15 NOTE — Progress Notes (Signed)
At 2125, daughter Lollie Marrow heard this nurse's instruction to patient that by 0000 he will not be eating or drinking anything, daughter was surprised that procedure is pushing though for tomorrow as during first conversation with Neurosurgery they were told he is not a good candidate for it. Explained to her that the wife agreed for the procedure, she called her mom since it was noted that patient has elevated risk for surgical failure. Daughter verbalized that she wants another day to talk first with care team  on what is best for her dad. Paged  DR. Elsner about concerns and also inform him that written consent has not been signed. Updated Charge nurse Lexine Baton about request for family meeting with care team.

## 2021-10-15 NOTE — Progress Notes (Signed)
SLP Cancellation Note  Patient Details Name: [name] MRN: [id number] DOB: [birth year]   Cancelled treatment:       Reason Eval/Treat Not Completed: Fatigue/lethargy limiting ability to participate- pt medicated for MR of spine this am.  Will f/u as schedule permits. D/W pt's brother at bedside.  Melora Menon L. Tivis Ringer, [place] Office number [phone] Pager [phone]    [name] Laurice 10/15/2021, 9:45 AM

## 2021-10-16 ENCOUNTER — Encounter (HOSPITAL_COMMUNITY)
Admission: EM | Disposition: A | Discharge status: Hospice | Payer: Self-pay | Source: Home / Self Care | Attending: Internal Medicine

## 2021-10-16 DIAGNOSIS — R5383 Other fatigue: Secondary | ICD-10-CM

## 2021-10-16 LAB — BASIC METABOLIC PANEL
Anion gap: 9 (ref 5–15)
BUN: 80 mg/dL — ABNORMAL HIGH (ref 8–23)
CO2: 20 mmol/L — ABNORMAL LOW (ref 22–32)
Calcium: 8.1 mg/dL — ABNORMAL LOW (ref 8.9–10.3)
Chloride: 122 mmol/L — ABNORMAL HIGH (ref 98–111)
Creatinine, Ser: 4.83 mg/dL — ABNORMAL HIGH (ref 0.61–1.24)
GFR, Estimated: 11 mL/min — ABNORMAL LOW (ref >60)
Glucose, Bld: 224 mg/dL — ABNORMAL HIGH (ref 70–99)
Potassium: 3.5 mmol/L (ref 3.5–5.1)
Sodium: 151 mmol/L — ABNORMAL HIGH (ref 135–145)

## 2021-10-16 LAB — CBC WITH DIFFERENTIAL/PLATELET
Abs Immature Granulocytes: 0.1 10*3/uL — ABNORMAL HIGH (ref 0.00–0.07)
Basophils Absolute: 0 10*3/uL (ref 0.0–0.1)
Basophils Relative: 0 %
Eosinophils Absolute: 0.2 10*3/uL (ref 0.0–0.5)
Eosinophils Relative: 2 %
HCT: 27.4 % — ABNORMAL LOW (ref 39.0–52.0)
Hemoglobin: 8.9 g/dL — ABNORMAL LOW (ref 13.0–17.0)
Immature Granulocytes: 1 %
Lymphocytes Relative: 8 %
Lymphs Abs: 1.1 10*3/uL (ref 0.7–4.0)
MCH: 27.4 pg (ref 26.0–34.0)
MCHC: 32.5 g/dL (ref 30.0–36.0)
MCV: 84.3 fL (ref 80.0–100.0)
Monocytes Absolute: 1.3 10*3/uL — ABNORMAL HIGH (ref 0.1–1.0)
Monocytes Relative: 10 %
Neutro Abs: 10.3 10*3/uL — ABNORMAL HIGH (ref 1.7–7.7)
Neutrophils Relative %: 79 %
Platelets: 226 10*3/uL (ref 150–400)
RBC: 3.25 MIL/uL — ABNORMAL LOW (ref 4.22–5.81)
RDW: 17.1 % — ABNORMAL HIGH (ref 11.5–15.5)
WBC: 13 10*3/uL — ABNORMAL HIGH (ref 4.0–10.5)
nRBC: 0 % (ref 0.0–0.2)

## 2021-10-16 LAB — RENAL FUNCTION PANEL
Albumin: <1.5 g/dL — ABNORMAL LOW (ref 3.5–5.0)
Anion gap: 7 (ref 5–15)
BUN: 78 mg/dL — ABNORMAL HIGH (ref 8–23)
CO2: 21 mmol/L — ABNORMAL LOW (ref 22–32)
Calcium: 8.1 mg/dL — ABNORMAL LOW (ref 8.9–10.3)
Chloride: 125 mmol/L — ABNORMAL HIGH (ref 98–111)
Creatinine, Ser: 4.4 mg/dL — ABNORMAL HIGH (ref 0.61–1.24)
GFR, Estimated: 13 mL/min — ABNORMAL LOW (ref >60)
Glucose, Bld: 255 mg/dL — ABNORMAL HIGH (ref 70–99)
Phosphorus: 3.7 mg/dL (ref 2.5–4.6)
Potassium: 3.5 mmol/L (ref 3.5–5.1)
Sodium: 153 mmol/L — ABNORMAL HIGH (ref 135–145)

## 2021-10-16 LAB — GLUCOSE, CAPILLARY
Glucose-Capillary: 215 mg/dL — ABNORMAL HIGH (ref 70–99)
Glucose-Capillary: 275 mg/dL — ABNORMAL HIGH (ref 70–99)
Glucose-Capillary: 269 mg/dL — ABNORMAL HIGH (ref 70–99)
Glucose-Capillary: 195 mg/dL — ABNORMAL HIGH (ref 70–99)
Glucose-Capillary: 142 mg/dL — ABNORMAL HIGH (ref 70–99)

## 2021-10-16 SURGERY — LUMBAR PERCUTANEOUS PEDICLE SCREW 1 LEVEL
Anesthesia: General

## 2021-10-16 MED ORDER — INSULIN GLARGINE-YFGN 100 UNIT/ML ~~LOC~~ SOLN
10.0000 [IU] | Freq: Every day | SUBCUTANEOUS | Status: DC
  Administered 2021-10-17 – 2021-10-19 (×3): 10 [IU] via SUBCUTANEOUS
  Filled 2021-10-16 (×3): qty 0.1

## 2021-10-16 MED ORDER — MEDIHONEY WOUND/BURN DRESSING EX PSTE
1.0000 "application " | PASTE | Freq: Every day | CUTANEOUS | Status: DC
  Administered 2021-10-16 – 2021-10-22 (×6): 1 via TOPICAL
  Filled 2021-10-16 (×2): qty 44

## 2021-10-16 MED ORDER — ENSURE ENLIVE PO LIQD
237.0000 mL | Freq: Three times a day (TID) | ORAL | Status: DC
Start: 2021-10-16 — End: 2021-10-23
  Administered 2021-10-16 – 2021-10-22 (×9): 237 mL via ORAL

## 2021-10-16 NOTE — Progress Notes (Signed)
Speech Language Pathology Treatment: Dysphagia  Patient Details Name: [name] MRN: [id number] DOB: 1941-02-15 Today's Date: 10/16/2021 Time: 4503-8882 SLP Time Calculation (min) (ACUTE ONLY): 15 min  Assessment / Plan / Recommendation Clinical Impression  Patient seen by SLP for skilled treatment session focused on dysphagia and as patient continues to be very lethargic focus was on education of patient's brother who was in the room. SLP demonstrated how to provide some moisture in patient's mouth using toothette swab. SLP then discussed plan to initiate liquids diet (full liquids) as patient has been on recently but to only give PO's when patient fully awake, alert and actively participating. SLP recommended only allow patient to have PO's when he is able to either hold cup and give himself sips, suck through straw, etc but to withhold PO's when patient is too lethargic to adequately respond. Patient's brother verbalized understanding. SLP will attempt to f/u next date.     HPI HPI: [name] is an 81 y.o. 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 Per chart MRI of the liver done that showed right hepatic lobe which is new with precontrast T1 hyperintense density which favors isolated melanoma. Pt had difficulty swallowing his pills morning of 6/2 and swallow eval ordered.      SLP Plan  Continue with current plan of care      Recommendations for follow up therapy are one component of a multi-disciplinary discharge planning process, led by the attending physician.   Recommendations may be updated based on patient status, additional functional criteria and insurance authorization.    Recommendations  Diet recommendations: Other(comment) (full liquids) Liquids provided via: Cup;Teaspoon;Straw Medication Administration: Other (Comment) (IV preferred, crushed in puree otherwise) Supervision: Full supervision/cueing for compensatory strategies;Patient able to self feed;Staff to assist with self feeding Compensations: Small sips/bites;Minimize environmental distractions;Slow rate                Oral Care Recommendations: Oral care QID;Staff/trained caregiver to provide oral care;Oral care before and after PO Follow Up Recommendations: No SLP follow up Assistance recommended at discharge: None SLP Visit Diagnosis: Dysphagia, unspecified (R13.10) Plan: Continue with current plan of care           Sonia Baller, MA, CCC-SLP Speech Therapy

## 2021-10-16 NOTE — Progress Notes (Signed)
Neurosurgery  I had a long discussion with the wife and brother at the bedside, as well as daughter by phone.  Patient still encephalopathic from combination of narcotics and underlying medical issues.  His kidney function somewhat worse today.  The daughter raised concerns that a previous surgeon told her he would not recommend surgery due to his poor medical condition.  However, as the brother and wife do not wish to proceed with palliative care and would very much like to increase his mobility, I explained that a minimally invasive L3-4 fusion with cement augmentation would be a reasonable option.  Alternative of TLSO bracing when OOB has thus far not been able to provide much palliative benefit.  I discussed his care with Dr. Tawanna Solo who believes he can tolerate such a surgery.  As he is at elevated risk with surgery, we will hold off on surgery until family is more clear they wish to proceed.

## 2021-10-16 NOTE — Progress Notes (Signed)
Patient ID: [name], male   DOB: [birth year], 81 y.o.   MRN: [id number] S: somnolent and unresponsive.  O:BP (!) 135/55 (BP Location: Left Arm)   Pulse 76   Temp 100.2 F (37.9 C) (Oral)   Resp 18   Ht 6' (1.829 m)   Wt 79 kg   SpO2 95%   BMI 23.62 kg/m   Intake/Output Summary (Last 24 hours) at 10/16/2021 1410 Last data filed at 10/16/2021 0900 Gross per 24 hour  Intake 938.66 ml  Output 1705 ml  Net -766.34 ml   Intake/Output: I/O last 3 completed shifts: In: 818.7 [P.O.:50; I.V.:768.7] Out: 2455 [Urine:2455]  Intake/Output this shift:  Total I/O In: 120 [P.O.:120] Out: 200 [Urine:200] Weight change: -4.1 kg TGY:BWLSLHTD unable to awaken for more than a second. CVS:RRR Resp: scattered rhonchi Abd: +BS, soft, NT/Nd Ext: no edema  Recent Labs  Lab 10/10/21 0610 10/11/21 0625 10/12/21 0524 10/13/21 0956 10/14/21 0340 10/15/21 0051 10/16/21 0457  NA 142 139 142 144 148* 152* 153*  K 4.7 5.3* 4.5 3.8 3.7 3.5 3.5  CL 112* 108 112* 113* 118* 123* 125*  CO2 22 19* 21* 18* 20* 20* 21*  GLUCOSE 81 111* 133* 134* 233* 202* 255*  BUN 63* 74* 77* 75* 73* 72* 78*  CREATININE 3.73* 4.22* 4.40* 4.10* 4.00* 4.19* 4.40*  ALBUMIN 2.3*  --   --   --  1.6*  --  <1.5*  CALCIUM 8.3* 8.3* 8.1* 8.1* 8.1* 8.2* 8.1*  PHOS  --   --   --   --   --   --  3.7  AST 142*  --   --   --  145*  --   --   ALT 82*  --   --   --  73*  --   --    Liver Function Tests: Recent Labs  Lab 10/10/21 0610 10/14/21 0340 10/16/21 0457  AST 142* 145*  --   ALT 82* 73*  --   ALKPHOS 290* 603*  --   BILITOT 1.3* 1.2  --   PROT 5.8* 5.4*  --   ALBUMIN 2.3* 1.6* <1.5*   No results for input(s): LIPASE, AMYLASE in the last 168 hours. Recent Labs  Lab 10/10/21 0610  AMMONIA 13   CBC: Recent Labs  Lab 10/10/21 0610 10/14/21 0340 10/15/21 0051 10/16/21 0457  WBC 13.1* 14.7* 14.0* 13.0*  NEUTROABS  --   --   --  10.3*  HGB 9.8* 9.5* 8.9* 8.9*  HCT 31.7* 28.6* 27.5* 27.4*  MCV 88.5  81.9 83.3 84.3  PLT 122* 215 224 226   Cardiac Enzymes: Recent Labs  Lab 10/10/21 0610 10/11/21 0625  CKTOTAL 592* 327   CBG: Recent Labs  Lab 10/15/21 1138 10/15/21 1701 10/15/21 2053 10/16/21 0717 10/16/21 1311  GLUCAP 218* 143* 276* 215* 269*    Iron Studies: No results for input(s): IRON, TIBC, TRANSFERRIN, FERRITIN in the last 72 hours. Studies/Results: DG Lumbar Spine 2-3 Views  Result Date: 10/14/2021 CLINICAL DATA:  Fall, weakness EXAM: LUMBAR SPINE - 2-3 VIEW COMPARISON:  CT 10/08/2021 FINDINGS: Five lumbar type vertebral segments. Vertebral body heights are maintained. Previously described fractured osteophytes at L2 and L4, better seen on recent CT. No change in alignment. Disc heights are preserved. Bones are demineralized. Aortic atherosclerosis. IMPRESSION: Previously described fractured osteophytes at L2 and L4, better seen on recent CT. No change in alignment. No new findings. Electronically Signed   By: Davina Poke D.O.  On: 10/14/2021 16:17   MR LUMBAR SPINE WO CONTRAST  Result Date: 10/15/2021 CLINICAL DATA:  81 year old male with recent fall. Back pain. L2 and L4 anterior superior endplate/osteophyte fractures in the setting of flowing endplate osteophytes and ankylosis on lumbar spine CT last month. Pain. Weakness. Large liver mass. EXAM: MRI LUMBAR SPINE WITHOUT CONTRAST TECHNIQUE: Multiplanar, multisequence MR imaging of the lumbar spine was performed. No intravenous contrast was administered. COMPARISON:  CT lumbar spine 10/08/2021. Abdomen MRI 10/09/2021. FINDINGS: Segmentation:  Normal on the comparison. Alignment: Lumbar lordosis is stable and relatively preserved. No significant spondylolisthesis. Vertebrae: Fairly subtle marrow edema at the anterior superior L2 endplate associated with the recent CT finding. At L4, the dominant finding is a large tear of the L3-L4 disc, oblique on series 16, image 7. This tracks toward the L4 osteophyte fractures seen  recently by CT. There is also a nondisplaced fracture of the tip of the L4 spinous process evident on series 15, image 8. No other marrow edema or acute osseous abnormality identified. Intact visible sacrum and SI joints. Normal background bone marrow signal. Conus medullaris and cauda equina: Conus extends to the T12-L1 level. No lower spinal cord or conus signal abnormality. Normal cauda equina nerve roots, with capacious spinal canal at most levels. Paraspinal and other soft tissues: Absent right kidney again noted. Large right liver mass partially visible, see abdomen MRI 10/09/2021. Disc levels: Visible lower thoracic levels through L2-L3 are negative for age, with underlying interbody ankylosis via flowing endplate osteophytes (traumatically interrupted at L1-L2 as seen on 10/08/2021. L3-L4: Long oblique tear of the disc tracking to the fractured anterior superior L4 osteophyte (series 15, image 7). Left far lateral broad-based disc bulge/protrusion (series 16, image 13). But no associated spinal stenosis. There is mild to moderate left L3 foraminal stenosis. L4-L5: Circumferential disc bulge with endplate spurring. Prominent left far lateral component here also. Mild to moderate facet and ligament flavum hypertrophy. Trace degenerative facet joint fluid. Mild spinal stenosis. Mild to moderate bilateral lateral recess stenosis (L5 nerve levels). Moderate to severe bilateral L4 foraminal stenosis. L5-S1: Negative disc.  Mild endplate spurring.  No stenosis. IMPRESSION: 1. Underlying lower thoracic and upper lumbar interbody ankylosis from flowing endplate osteophytes. Acute fractures of the anterior superior endplates/osteophytes at both L2 and L4 are better demonstrated on the recent CT. But this exam demonstrates a long tear of the L3-L4 disc, and a nondisplaced fracture of the L4 spinous process, in association with the L4 injury. No subsequent spinal stenosis at that level but bulky leftward/lateral disc  herniation is associated with up to moderate left L3 neural foraminal stenosis. 2. Disc and posterior element degeneration at L4-L5 with mild spinal stenosis, mild to moderate bilateral lateral recess stenosis, and moderate to severe bilateral L4 foraminal stenosis. 3. Large right Liver mass, see Abdomen MRI 10/09/2021. Electronically Signed   By: Genevie Ann M.D.   On: 10/15/2021 09:46    Chlorhexidine Gluconate Cloth  6 each Topical Q0600   dorzolamide-timolol  1 drop Both Eyes BID   feeding supplement  1 Container Oral TID BM   insulin aspart  0-15 Units Subcutaneous TID WC   [START ON 10/17/2021] insulin glargine-yfgn  10 Units Subcutaneous Daily   isosorbide dinitrate  20 mg Oral TID   latanoprost  1 drop Both Eyes QHS   leptospermum manuka honey  1 application. Topical Daily   lidocaine  1 patch Transdermal Q24H   nebivolol  10 mg Oral Daily   pantoprazole (PROTONIX)  IV  40 mg Intravenous Daily   rosuvastatin  20 mg Oral QHS    BMET    Component Value Date/Time   NA 153 (H) 10/16/2021 0457   NA 141 11/20/2012 1028   K 3.5 10/16/2021 0457   K 4.2 11/20/2012 1028   CL 125 (H) 10/16/2021 0457   CL 104 10/26/2012 1011   CO2 21 (L) 10/16/2021 0457   CO2 25 11/20/2012 1028   GLUCOSE 255 (H) 10/16/2021 0457   GLUCOSE 143 (H) 11/20/2012 1028   GLUCOSE 304 (H) 10/26/2012 1011   BUN 78 (H) 10/16/2021 0457   BUN 25.4 11/20/2012 1028   CREATININE 4.40 (H) 10/16/2021 0457   CREATININE 1.6 (H) 11/20/2012 1028   CALCIUM 8.1 (L) 10/16/2021 0457   CALCIUM 9.3 11/20/2012 1028   GFRNONAA 13 (L) 10/16/2021 0457   GFRAA 34 (L) 12/02/2018 0528   CBC    Component Value Date/Time   WBC 13.0 (H) 10/16/2021 0457   RBC 3.25 (L) 10/16/2021 0457   HGB 8.9 (L) 10/16/2021 0457   HGB 14.7 10/26/2012 1011   HCT 27.4 (L) 10/16/2021 0457   HCT 43.2 10/26/2012 1011   PLT 226 10/16/2021 0457   PLT 167 10/26/2012 1011   MCV 84.3 10/16/2021 0457   MCV 89.7 10/26/2012 1011   MCH 27.4 10/16/2021 0457    MCHC 32.5 10/16/2021 0457   RDW 17.1 (H) 10/16/2021 0457   RDW 13.6 10/26/2012 1011   LYMPHSABS 1.1 10/16/2021 0457   LYMPHSABS 1.7 10/26/2012 1011   MONOABS 1.3 (H) 10/16/2021 0457   MONOABS 0.6 10/26/2012 1011   EOSABS 0.2 10/16/2021 0457   EOSABS 0.2 10/26/2012 1011   BASOSABS 0.0 10/16/2021 0457   BASOSABS 0.1 10/26/2012 1011    Assessment/ Plan: AKI on CKD IV - b/l creat 3.1- 3.7, eGFR 15-20.  Creat 3.1 on admission 5/29 in setting of fall, L2 fracture and mild/ mod acute rhabdo.  Pt f/b Dr Candiss Norse at [facility]. Abd [place] > absent R kidney, L kidney w/o obstruction. UA showed myoglobin (low rbc/ high Hb). Suspect mild rhabdomyolysis-related ATN as cause of AKI. Creat peaked at 4.4 then improved down to 4.0 but back to 4.4 today.    Has not been eating or drinking for past 48 hours and likely has some pre-renal component.  Was started on D5W due to hypernatremia but would prefer D51/2 NS to help with renal perfusion.  Will start IVF's and follow UOP and Scr if Na has improved with D%.  Not a suitable candidate for dialysis and recommend palliative care consult to help set goals/limits of care as he is still full code.  SP fall / L2 fracture - found down at home, acute back pain is signicant issue still.  Family divided about Le-4 fusion by neurosurgery and are talking with palliative as well. Hypernatremia - due to poor po intake and consistent with dehydration.  He was started on D5 but may benefit from D5 1/2 NS given poor po intake and low albumin.  Will recheck Na now and decide whether to change to 1/2NS.  Hx of MGUS - serum free LC's, SPEP and UPEP ordered.  Light chains normal, + bence jones + lamda type, IgA monoclonal protien in urine, M spike 5.3%.  could have evolved into multiple myeloma. HTN - getting BB and norvasc, BP's good. Avoid hypotension, keep SBP > 115-120 please w/ AKI Volume - mild edema, CXR 5/29 neg. Good UOP, has condom cath now. DC ivf MGUS -  as above Liver mass - per  pmd DM2  Donetta Potts, MD [facility]

## 2021-10-16 NOTE — Progress Notes (Signed)
Inpatient Diabetes Program Recommendations  AACE/ADA: New Consensus Statement on Inpatient Glycemic Control (2015)  Target Ranges:  Prepandial:   less than 140 mg/dL      Peak postprandial:   less than 180 mg/dL (1-2 hours)      Critically ill patients:  140 - 180 mg/dL   Lab Results  Component Value Date   GLUCAP 215 (H) 10/16/2021   HGBA1C 8.8 (H) 10/08/2021    Review of Glycemic Control  Latest Reference Range & Units 10/15/21 07:46 10/15/21 11:38 10/15/21 17:01 10/15/21 20:53 10/16/21 07:17  Glucose-Capillary 70 - 99 mg/dL 180 (H) 218 (H) 143 (H) 276 (H) 215 (H)   Diabetes history: DM 2 Outpatient Diabetes medications: Tresiba 10 units Daily Current orders for Inpatient glycemic control:  Novolog 0-15 units tid  Inpatient Diabetes Program Recommendations:    Pt was NPO this am -  Increase Semglee to 10 units home dose  Thanks,  Tama Headings RN, MSN, BC-ADM Inpatient Diabetes Coordinator Team Pager [phone] (8a-5p)

## 2021-10-16 NOTE — Progress Notes (Signed)
SLP Cancellation Note  Patient Details Name: [name] MRN: [id number] DOB: [birth year]   Cancelled treatment:       Reason Eval/Treat Not Completed: Other (comment) (NPO for surgery today. SLP will f/u when patient cleared to return to PO's)  Sonia Baller, MA, CCC-SLP Speech Therapy

## 2021-10-16 NOTE — Consult Note (Addendum)
[place] Nurse Re-consult Note: Pt is familiar to [facility] team from previous consult for posterior head; refer to progress notes on 5/31.  Posterior head wound was a Stage 2 pressure injury, present on admission. Wound has decreased in size and is now 2X1X.1cm, 80% red and dry, 20% patchy areas of yellow.   Requested to assess spine today.  Pt now noted by bedside nurse to have an Unstageable pressure injury to middle spine; 6X2cm, 60% red and moist, 40% yellow slough Pressure Injury POA: No Dressing procedure/placement/frequency: Topical treatment orders provided for bedside nurses to perform as follows to assist with removal of nonviable tissue: Apply Medihoney to posterior spine and posterior head Q day, then cover with foam dressing.  (Change foam dressing Q 3 days or PRN soiling.) WOC team will assess the location weekly to determine if a change in the plan of care is indicated at that time.  Julien Girt MSN, RN, [place], [place], [place]

## 2021-10-16 NOTE — Progress Notes (Signed)
Patient ID: [name], male   DOB: November 05, 1940, 81 y.o.   MRN: [id number]    Progress Note from the Palliative Medicine Team at [facility]   Patient Name: [name]        Date: 10/16/2021 DOB: 1941-02-18  Age: 81 y.o. MRN#: [id number] Attending Physician: Andre Coss, MD Primary Care Physician: Andre Bunting, MD Admit Date: 10/08/2021   Medical records reviewed    81 y.o. 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  Work-up in the emergency department showed rhabdomyolysis, L2 anterior superior fracture.  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  Hospital course remarkable for progressive worsening of mental status, declining kidney function.  Patient transferred from [facility] to [facility] for consideration of dialysis.  Nephrology following.   PMT consulted for Finger conversations.  This NP visited patient at the bedside as a follow up for palliative medicine needs and emotional support.  Initial consult with  palliative medicine was on 10/14/2021  Patient is lethargic and difficult to arouse.  Please call and spoke to wife by phone for a detailed conversation regarding current medical situation.  Attempting to help wife explore the complexity and multiple comorbidities of her husband's current medical situation education was offered regarding his worsening renal function/creatinine/ 4.4 today and nephrology's note suggesting the patient is not a suitable candidate for dialysis, large liver mass, history of MGUS, L2 fracture and consideration for surgical repair.  Wife was able to verbalize that her husband "actually" has had ongoing physical and functional decline over the past year.   She reports a shuffling gait and history of falls.  Education offered on concept  specific to adult failure to thrive, and the limitations of medical interventions to prolong quality of life when the body does fail to thrive.  Long discussion and education regarding patient's advance care planning documents, requesting a desire for a natural death and, suggests limitations of life prolonging measures in the event that he is considered terminal.  Education offered today regarding  the importance of continued conversation with family and their  medical providers regarding overall plan of care and treatment options,  ensuring decisions are within the context of the patients values and GOCs.   Wife agrees to meet me tomorrow afternoon at 1 PM along with her daughter/[name] for continued conversation regarding treatment option decisions, advanced directive decisions and anticipatory care needs.  Questions and concerns addressed     Wadie Lessen NP  Palliative Medicine Team Team Phone # [phone] Pager [phone]

## 2021-10-16 NOTE — Progress Notes (Signed)
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  Work-up in the emergency department showed rhabdomyolysis, L2 anterior superior corner fracture.  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  Hospital course remarkable for progressive worsening of mental status, declining kidney function.  Patient transferred from [facility] to [facility] for consideration of dialysis.  Nephrology closely following.  Neurosurgery also consulted for persistent back pain, pending L3-L4 fusion surgery awaiting family decision.  Assessment & Plan:  Principal Problem:   AKI (acute kidney injury) ([place]) Active Problems:   Coronary artery disease of native artery of native heart with stable angina pectoris (HCC)   MGUS (monoclonal gammopathy of unknown significance)   Primary hypertension   CKD (chronic kidney disease) stage 4, GFR 15-29 ml/min (HCC)   HLD (hyperlipidemia)   Type 2 diabetes mellitus with hyperlipidemia ([place])   Fall at home, initial encounter   Closed fracture of lumbar spine without lesion of spinal cord (HCC)   GERD (gastroesophageal reflux disease)   Elevated LFTs   Rhabdomyolysis   Pressure injury of skin   Normal anion gap metabolic acidosis   Fall/L2 vertebral fracture: Fell at home.CT   Imaging showed  L2 anterior superior corner fracture. Case was discussed with neurosurgery and initially commended TLSO brace with OOB and recommended outpatient follow-up.  Physical therapy saw the patient and recommended skilled nursing facility on discharge.  Hospital course remarkable for severe progressive excruciating back  pain.  Neurosurgery reconsulted and following.   MRI  of the lumbar spine showed  acute fractures of the anterior superior endplates/osteophytes atboth L2 and L4 ,a long tear of the L3-L4 disc, and nondisplaced fracture of the L4 spinous process, in association with the L4 injury.  Now neurosurgery offering minimally invasive L3-L4 fusion procedure.  Waiting for family decision to go ahead for surgery.  Continue supportive care, pain management  AKI on CKD stage IV/rhabdomyolysis/hyperkalemia/non-anion gap metabolic acidosis:   Creatinine was in the range of 4 on April 2023.  On admission, creatinine was 3.1.  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  Follows with nephrology.  He started on IV fluids for rhabdomyolysis.  Creatinine has been in the range of 4.  AKI suspected to be from rhabdomyolysis associated  ATN.  Transferred to [facility] for consideration of dialysis.  Continue sodium bicarb tablets.Hyperkalemia resolved with Kayexalate.  Awaiting recommendation from nephrology if he needs dialysis.Also has  hypernatremia.  Continue gentle D5 infusion   Altered mental status: Secondary to metabolic encephalopathy from from uremia or narcotics.  Continue to monitor.  Minimize narcotics, sedatives.  Delirium precautions.  He is currently oriented to place.He has been having poor oral intake.  Continue gentle D5  Hypertension: Continue by bisystolic,imdur.  Monitor blood pressure  Liver mass/abnormal LFTs: Abdominal ultrasound showed right hepatic lobe mass about 12 cm.  MRI confirmed right hepatic lobe mass suspicious for isolated melanoma metastasis.  Elevated alpha-fetoprotein. Elevated Liver enzymes. Hepatitis panel negative.  His oncologist is aware about this finding and he has been set up an appointment as an outpatient.  Diabetes type 2: Recent hemoglobin A1c of 8.8.  On Tarceva prior to admission.  Currently on sliding scale insulin.  Normocytic anemia/iron deficiency:  Secondary to CKD, MGUS.  Iron studies on 5/31 showed iron of 14.  Given IV  iron.  Continue to monitor hemoglobin  Leukocytosis: Likely reactive, cud be associated  with ongoing medical problems including malignancy, AKI.  Continue to monitor  Dysphagia: Speech therapy following, recommended NpO for now due to persistent lethargy.  Now started on clear liquid diet/full liquid diet.  If he does not take orally in 12 to 24 hours, we need to consider putting tube   for feeding  Debility/deconditioning: Patient seen by PT/OT and recommended skilled nursing facility on discharge  Goals of care: Multiple comorbidities in this elderly patient including possible liver cancer, severe renal failure, lumbar vertebral fracture.  He was completely independent and  ambulatory until he fell.  Palliaitve care closely following for goals of care.remains full code    Pressure Injury 10/10/21 Head Posterior Stage 2 -  Partial thickness loss of dermis presenting as a shallow open injury with a red, pink wound bed without slough. (Active)  10/10/21   Location: Head  Location Orientation: Posterior  Staging: Stage 2 -  Partial thickness loss of dermis presenting as a shallow open injury with a red, pink wound bed without slough.  Wound Description (Comments):   Present on Admission: Yes     Pressure Injury 10/13/21 Buttocks Right;Left Stage 1 -  Intact skin with non-blanchable redness of a localized area usually over a bony prominence. redness on bil buttocks (Active)  10/13/21 2200  Location: Buttocks  Location Orientation: Right;Left  Staging: Stage 1 -  Intact skin with non-blanchable redness of a localized area usually over a bony prominence.  Wound Description (Comments): redness on bil buttocks  Present on Admission:      Pressure Injury 10/15/21 Vertebral column Unstageable - Full thickness tissue loss in which the base of the injury is covered by slough (yellow, tan, gray, green or brown) and/or eschar (tan, brown or black) in the wound bed. yellow wound base (Active)   10/15/21 1030  Location: Vertebral column  Location Orientation:   Staging: Unstageable - Full thickness tissue loss in which the base of the injury is covered by slough (yellow, tan, gray, green or brown) and/or eschar (tan, brown or black) in the wound bed.  Wound Description (Comments): yellow wound base  Present on Admission: No          DVT prophylaxis:SCDs Start: 10/08/21 1541     Code Status: Full Code  Family Communication: Brother at the bedside today.Called and discussed with daughter on phone on 6/4  Patient status:Inpatient  Patient is from :Home  Anticipated discharge to: Skilled nursing facility  Estimated DC date: Not sure, waiting improvement in the renal function, mental status, waiting for recommendations from neurosurgery for vertebral fracture  Consultants: neurosurgery,nephrology  Procedures:None  Antimicrobials:  Anti-infectives (From admission, onward)    None       Subjective:  Patient seen and examined at the bedside this morning.  Hemodynamically stable.  He was lying on bed.  He is more alert, awake than yesterday.  He is still confused.  Looks like back pain is better today.  Brother at the bedside  Objective: Vitals:   10/15/21 0953 10/15/21 1701 10/15/21 2052 10/16/21 0430  BP: (!) 133/56 (!) 138/59 (!) 129/58 (!) 135/55  Pulse: 72 76 74 76  Resp: '16 18 18 18  '$ Temp: (!) 97.5 F (36.4 C) 97.6 F (36.4 C) 97.8 F (36.6 C) 100.2 F (37.9 C)  TempSrc: Oral Oral Oral  Oral  SpO2: 96% 96% 100% 95%  Weight:    79 kg  Height:        Intake/Output Summary (Last 24 hours) at 10/16/2021 1305 Last data filed at 10/16/2021 0900 Gross per 24 hour  Intake 938.66 ml  Output 1705 ml  Net -766.34 ml   Filed Weights   10/14/21 0700 10/15/21 0500 10/16/21 0430  Weight: 86.3 kg 83.1 kg 79 kg    Examination:   General exam: Overall comfortable, debilitated, lying in bed HEENT: PERRL Respiratory system:  no wheezes or crackles   Cardiovascular system: S1 & S2 heard, RRR.  Gastrointestinal system: Abdomen is nondistended, soft and nontender. Central nervous system: Alert, awake, but not oriented Extremities: No edema, no clubbing ,no cyanosis Skin: No rashes, no ulcers,no icterus    Data Reviewed: I have personally reviewed following labs and imaging studies  CBC: Recent Labs  Lab 10/10/21 0610 10/14/21 0340 10/15/21 0051 10/16/21 0457  WBC 13.1* 14.7* 14.0* 13.0*  NEUTROABS  --   --   --  10.3*  HGB 9.8* 9.5* 8.9* 8.9*  HCT 31.7* 28.6* 27.5* 27.4*  MCV 88.5 81.9 83.3 84.3  PLT 122* 215 224 962   Basic Metabolic Panel: Recent Labs  Lab 10/12/21 0524 10/13/21 0956 10/14/21 0340 10/15/21 0051 10/16/21 0457  NA 142 144 148* 152* 153*  K 4.5 3.8 3.7 3.5 3.5  CL 112* 113* 118* 123* 125*  CO2 21* 18* 20* 20* 21*  GLUCOSE 133* 134* 233* 202* 255*  BUN 77* 75* 73* 72* 78*  CREATININE 4.40* 4.10* 4.00* 4.19* 4.40*  CALCIUM 8.1* 8.1* 8.1* 8.2* 8.1*  PHOS  --   --   --   --  3.7     Recent Results (from the past 240 hour(s))  Resp Panel by RT-PCR (Flu A&B, Covid) Anterior Nasal Swab     Status: None   Collection Time: 10/08/21 11:09 AM   Specimen: Anterior Nasal Swab  Result Value Ref Range Status   SARS Coronavirus 2 by RT PCR NEGATIVE NEGATIVE Final    Comment: (NOTE) SARS-CoV-2 target nucleic acids are NOT DETECTED.  The SARS-CoV-2 RNA is generally detectable in upper respiratory specimens during the acute phase of infection. The lowest concentration of SARS-CoV-2 viral copies this assay can detect is 138 copies/mL. A negative result does not preclude SARS-Cov-2 infection and should not be used as the sole basis for treatment or other patient management decisions. A negative result may occur with  improper specimen collection/handling, submission of specimen other than nasopharyngeal swab, presence of viral mutation(s) within the areas targeted by this assay, and inadequate number of  viral copies(<138 copies/mL). A negative result must be combined with clinical observations, patient history, and epidemiological information. The expected result is Negative.  Fact Sheet for Patients:  EntrepreneurPulse.com.au  Fact Sheet for Healthcare Providers:  IncredibleEmployment.be  This test is no t yet approved or cleared by the [place] FDA and  has been authorized for detection and/or diagnosis of SARS-CoV-2 by FDA under an Emergency Use Authorization (EUA). This EUA will remain  in effect (meaning this test can be used) for the duration of the COVID-19 declaration under Section 564(b)(1) of the Act, 21 U.S.C.section 360bbb-3(b)(1), unless the authorization is terminated  or revoked sooner.       Influenza A by PCR NEGATIVE NEGATIVE Final   Influenza B by PCR NEGATIVE NEGATIVE Final    Comment: (NOTE) The Xpert Xpress SARS-CoV-2/FLU/RSV plus assay is intended as an aid  in the diagnosis of influenza from Nasopharyngeal swab specimens and should not be used as a sole basis for treatment. Nasal washings and aspirates are unacceptable for Xpert Xpress SARS-CoV-2/FLU/RSV testing.  Fact Sheet for Patients: EntrepreneurPulse.com.au  Fact Sheet for Healthcare Providers: IncredibleEmployment.be  This test is not yet approved or cleared by the [place] FDA and has been authorized for detection and/or diagnosis of SARS-CoV-2 by FDA under an Emergency Use Authorization (EUA). This EUA will remain in effect (meaning this test can be used) for the duration of the COVID-19 declaration under Section 564(b)(1) of the Act, 21 U.S.C. section 360bbb-3(b)(1), unless the authorization is terminated or revoked.  Performed at [facility], [place] [address], [place], [place] [address]      Radiology Studies: DG Lumbar Spine 2-3 Views  Result Date: 10/14/2021 CLINICAL DATA:  Fall,  weakness EXAM: LUMBAR SPINE - 2-3 VIEW COMPARISON:  CT 10/08/2021 FINDINGS: Five lumbar type vertebral segments. Vertebral body heights are maintained. Previously described fractured osteophytes at L2 and L4, better seen on recent CT. No change in alignment. Disc heights are preserved. Bones are demineralized. Aortic atherosclerosis. IMPRESSION: Previously described fractured osteophytes at L2 and L4, better seen on recent CT. No change in alignment. No new findings. Electronically Signed   By: Davina Poke D.O.   On: 10/14/2021 16:17   MR LUMBAR SPINE WO CONTRAST  Result Date: 10/15/2021 CLINICAL DATA:  81 year old male with recent fall. Back pain. L2 and L4 anterior superior endplate/osteophyte fractures in the setting of flowing endplate osteophytes and ankylosis on lumbar spine CT last month. Pain. Weakness. Large liver mass. EXAM: MRI LUMBAR SPINE WITHOUT CONTRAST TECHNIQUE: Multiplanar, multisequence MR imaging of the lumbar spine was performed. No intravenous contrast was administered. COMPARISON:  CT lumbar spine 10/08/2021. Abdomen MRI 10/09/2021. FINDINGS: Segmentation:  Normal on the comparison. Alignment: Lumbar lordosis is stable and relatively preserved. No significant spondylolisthesis. Vertebrae: Fairly subtle marrow edema at the anterior superior L2 endplate associated with the recent CT finding. At L4, the dominant finding is a large tear of the L3-L4 disc, oblique on series 16, image 7. This tracks toward the L4 osteophyte fractures seen recently by CT. There is also a nondisplaced fracture of the tip of the L4 spinous process evident on series 15, image 8. No other marrow edema or acute osseous abnormality identified. Intact visible sacrum and SI joints. Normal background bone marrow signal. Conus medullaris and cauda equina: Conus extends to the T12-L1 level. No lower spinal cord or conus signal abnormality. Normal cauda equina nerve roots, with capacious spinal canal at most levels.  Paraspinal and other soft tissues: Absent right kidney again noted. Large right liver mass partially visible, see abdomen MRI 10/09/2021. Disc levels: Visible lower thoracic levels through L2-L3 are negative for age, with underlying interbody ankylosis via flowing endplate osteophytes (traumatically interrupted at L1-L2 as seen on 10/08/2021. L3-L4: Long oblique tear of the disc tracking to the fractured anterior superior L4 osteophyte (series 15, image 7). Left far lateral broad-based disc bulge/protrusion (series 16, image 13). But no associated spinal stenosis. There is mild to moderate left L3 foraminal stenosis. L4-L5: Circumferential disc bulge with endplate spurring. Prominent left far lateral component here also. Mild to moderate facet and ligament flavum hypertrophy. Trace degenerative facet joint fluid. Mild spinal stenosis. Mild to moderate bilateral lateral recess stenosis (L5 nerve levels). Moderate to severe bilateral L4 foraminal stenosis. L5-S1: Negative disc.  Mild endplate spurring.  No stenosis. IMPRESSION: 1. Underlying lower thoracic and  upper lumbar interbody ankylosis from flowing endplate osteophytes. Acute fractures of the anterior superior endplates/osteophytes at both L2 and L4 are better demonstrated on the recent CT. But this exam demonstrates a long tear of the L3-L4 disc, and a nondisplaced fracture of the L4 spinous process, in association with the L4 injury. No subsequent spinal stenosis at that level but bulky leftward/lateral disc herniation is associated with up to moderate left L3 neural foraminal stenosis. 2. Disc and posterior element degeneration at L4-L5 with mild spinal stenosis, mild to moderate bilateral lateral recess stenosis, and moderate to severe bilateral L4 foraminal stenosis. 3. Large right Liver mass, see Abdomen MRI 10/09/2021. Electronically Signed   By: Genevie Ann M.D.   On: 10/15/2021 09:46    Scheduled Meds:  Chlorhexidine Gluconate Cloth  6 each Topical  Q0600   dorzolamide-timolol  1 drop Both Eyes BID   feeding supplement  1 Container Oral TID BM   insulin aspart  0-15 Units Subcutaneous TID WC   insulin glargine-yfgn  5 Units Subcutaneous Daily   isosorbide dinitrate  20 mg Oral TID   latanoprost  1 drop Both Eyes QHS   leptospermum manuka honey  1 application. Topical Daily   lidocaine  1 patch Transdermal Q24H   nebivolol  10 mg Oral Daily   pantoprazole (PROTONIX) IV  40 mg Intravenous Daily   rosuvastatin  20 mg Oral QHS   Continuous Infusions:  dextrose 50 mL/hr at 10/16/21 0111      LOS: 8 days   Shelly Coss, MD [facility] P6/10/2021, 1:05 PM

## 2021-10-16 NOTE — Progress Notes (Signed)
Subjective: Patient reports that he continues to have back pain but is under better control with the change from dilaudid to fentanyl. No acute events overnight.   Objective: Vital signs in last 24 hours: Temp:  [97.5 F (36.4 C)-100.2 F (37.9 C)] 100.2 F (37.9 C) (06/06 0430) Pulse Rate:  [72-76] 76 (06/06 0430) Resp:  [16-18] 18 (06/06 0430) BP: (129-138)/(55-59) 135/55 (06/06 0430) SpO2:  [95 %-100 %] 95 % (06/06 0430) Weight:  [79 kg] 79 kg (06/06 0430)  Intake/Output from previous day: 06/05 0701 - 06/06 0700 In: 818.7 [P.O.:50; I.V.:768.7] Out: 1505 [Urine:1505] Intake/Output this shift: No intake/output data recorded.  Physical Exam: Patient is awake, confused, and oriented to person only. He is able to follow commands in his BUE and BLE. Eyes open spontaneously. They are in NAD. MAEW with good strength that is symmetric bilaterally.  BUE 5/5 throughout, BLE 5/5 throughout. Sensation to light touch is intact. PERLA, EOMI. CNs grossly intact.    Lab Results: Recent Labs    10/15/21 0051 10/16/21 0457  WBC 14.0* 13.0*  HGB 8.9* 8.9*  HCT 27.5* 27.4*  PLT 224 226   BMET Recent Labs    10/15/21 0051 10/16/21 0457  NA 152* 153*  K 3.5 3.5  CL 123* 125*  CO2 20* 21*  GLUCOSE 202* 255*  BUN 72* 78*  CREATININE 4.19* 4.40*  CALCIUM 8.2* 8.1*    Studies/Results: DG Lumbar Spine 2-3 Views  Result Date: 10/14/2021 CLINICAL DATA:  Fall, weakness EXAM: LUMBAR SPINE - 2-3 VIEW COMPARISON:  CT 10/08/2021 FINDINGS: Five lumbar type vertebral segments. Vertebral body heights are maintained. Previously described fractured osteophytes at L2 and L4, better seen on recent CT. No change in alignment. Disc heights are preserved. Bones are demineralized. Aortic atherosclerosis. IMPRESSION: Previously described fractured osteophytes at L2 and L4, better seen on recent CT. No change in alignment. No new findings. Electronically Signed   By: Davina Poke D.O.   On: 10/14/2021  16:17   MR LUMBAR SPINE WO CONTRAST  Result Date: 10/15/2021 CLINICAL DATA:  81 year old male with recent fall. Back pain. L2 and L4 anterior superior endplate/osteophyte fractures in the setting of flowing endplate osteophytes and ankylosis on lumbar spine CT last month. Pain. Weakness. Large liver mass. EXAM: MRI LUMBAR SPINE WITHOUT CONTRAST TECHNIQUE: Multiplanar, multisequence MR imaging of the lumbar spine was performed. No intravenous contrast was administered. COMPARISON:  CT lumbar spine 10/08/2021. Abdomen MRI 10/09/2021. FINDINGS: Segmentation:  Normal on the comparison. Alignment: Lumbar lordosis is stable and relatively preserved. No significant spondylolisthesis. Vertebrae: Fairly subtle marrow edema at the anterior superior L2 endplate associated with the recent CT finding. At L4, the dominant finding is a large tear of the L3-L4 disc, oblique on series 16, image 7. This tracks toward the L4 osteophyte fractures seen recently by CT. There is also a nondisplaced fracture of the tip of the L4 spinous process evident on series 15, image 8. No other marrow edema or acute osseous abnormality identified. Intact visible sacrum and SI joints. Normal background bone marrow signal. Conus medullaris and cauda equina: Conus extends to the T12-L1 level. No lower spinal cord or conus signal abnormality. Normal cauda equina nerve roots, with capacious spinal canal at most levels. Paraspinal and other soft tissues: Absent right kidney again noted. Large right liver mass partially visible, see abdomen MRI 10/09/2021. Disc levels: Visible lower thoracic levels through L2-L3 are negative for age, with underlying interbody ankylosis via flowing endplate osteophytes (traumatically interrupted at L1-L2 as  seen on 10/08/2021. L3-L4: Long oblique tear of the disc tracking to the fractured anterior superior L4 osteophyte (series 15, image 7). Left far lateral broad-based disc bulge/protrusion (series 16, image 13). But no  associated spinal stenosis. There is mild to moderate left L3 foraminal stenosis. L4-L5: Circumferential disc bulge with endplate spurring. Prominent left far lateral component here also. Mild to moderate facet and ligament flavum hypertrophy. Trace degenerative facet joint fluid. Mild spinal stenosis. Mild to moderate bilateral lateral recess stenosis (L5 nerve levels). Moderate to severe bilateral L4 foraminal stenosis. L5-S1: Negative disc.  Mild endplate spurring.  No stenosis. IMPRESSION: 1. Underlying lower thoracic and upper lumbar interbody ankylosis from flowing endplate osteophytes. Acute fractures of the anterior superior endplates/osteophytes at both L2 and L4 are better demonstrated on the recent CT. But this exam demonstrates a long tear of the L3-L4 disc, and a nondisplaced fracture of the L4 spinous process, in association with the L4 injury. No subsequent spinal stenosis at that level but bulky leftward/lateral disc herniation is associated with up to moderate left L3 neural foraminal stenosis. 2. Disc and posterior element degeneration at L4-L5 with mild spinal stenosis, mild to moderate bilateral lateral recess stenosis, and moderate to severe bilateral L4 foraminal stenosis. 3. Large right Liver mass, see Abdomen MRI 10/09/2021. Electronically Signed   By: Genevie Ann M.D.   On: 10/15/2021 09:46    Assessment/Plan: 81 y.o. 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 MRI of his lumbar spine revealed a tear in the ALL and disc injury of L3-4 with mild posterior ligamentous complex edema. He was initially instructed to don a TLSO brace, however, he has been unable to tolerate bracing. Due to his complex medical conditions, it was recommended that he undergo percutaneous fusion surgery with cement augmentation which would likely offer him the best clinical  outcome. This morning, the patient's brother expressed new hesitations about proceeding with surgery and requested to speak with Dr. Marcello Moores. I will pass the information on to Dr. Marcello Moores so he can set up a time to meet with the patient and his family to discuss their concerns.     LOS: 8 days     Marvis Moeller, DNP, AGNP-C Neurosurgery Nurse Practitioner  [facility] [place] [address], [place], [place] [address] P: [phone]    F: [phone]  10/16/2021 8:03 AM

## 2021-10-16 NOTE — Progress Notes (Signed)
Initial Nutrition Assessment  INTERVENTION:   If unable to advance diet within 24 hours, recommend insertion of Cortrak tube with initiation of EN. Reached out to attending with recommendations  Tube Feeding Recommendations:  Osmolite 1.2 at 65 ml/hr Pro-source TF 45 mL daily This provides  1912 kcals, 98 g of protein and 1264 mL of free water  No BM since 6/1 (5 days); recommend bowel regimen  NUTRITION DIAGNOSIS:   Inadequate oral intake related to dysphagia, lethargy/confusion, acute illness as evidenced by NPO status, meal completion < 25%.  GOAL:   Patient will meet greater than or equal to 90% of their needs   MONITOR:   Diet advancement, Labs, Weight trends  REASON FOR ASSESSMENT:   Rounds    ASSESSMENT:   81 yo male admitted post fall with L2 vertebral fracture, AKI on CKD IV, metabolic encephalopathy. 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   Neurosurgery is following for possible surgery (fusion surgery with cement augmentation); family is unsure if they want to proceed, awaiting decision  Pt has been NPO/CL since 6/02. Recorded po intake prior to this 10-50% of meals. 25% on average. Inadequate po intake x 8 days  Hypernatremia, D5 at 50 ml/hr started yesterday  Current wt 79 kg; per weight encounters, pt has lost weight. Weight of 83 kg in April 2023  Wt Readings from Last 10 Encounters:  10/16/21 79 kg  08/13/21 83 kg  06/27/21 88 kg  06/26/20 90.4 kg  06/21/19 92.5 kg  12/01/18 94.3 kg  11/17/18 88.9 kg  02/05/18 88.1 kg  02/14/17 82.7 kg  11/14/16 83.5 kg   Noted pt was independent and ambulatory before he fell.   Labs: sodium 153 (H), Creatinine 4.40, BUN 78, albumin <1.5, CBGs 143-276 Meds: ss novolog, semglee  NUTRITION - FOCUSED PHYSICAL  EXAM:  Deferred  Diet Order:   Diet Order             Diet full liquid Room service appropriate? Yes; Fluid consistency: Thin  Diet effective now                   EDUCATION NEEDS:   Not appropriate for education at this time  Skin:  Skin Assessment: Skin Integrity Issues: Skin Integrity Issues:: Stage II, DTI DTI: vertebral column Stage II: head  Last BM:  6/01  Height:   Ht Readings from Last 1 Encounters:  10/08/21 6' (1.829 m)    Weight:   Wt Readings from Last 1 Encounters:  10/16/21 79 kg     BMI:  Body mass index is 23.62 kg/m.  Estimated Nutritional Needs:   Kcal:  1850-2050 kcals  Protein:  90-105g  Fluid:  >/= 1.8 L   Kerman Passey MS, RDN, LDN, CNSC Registered Dietitian III Clinical Nutrition RD Pager and On-Call Pager Number Located in [place]

## 2021-10-17 DIAGNOSIS — R627 Adult failure to thrive: Secondary | ICD-10-CM

## 2021-10-17 DIAGNOSIS — Z66 Do not resuscitate: Secondary | ICD-10-CM

## 2021-10-17 LAB — RENAL FUNCTION PANEL
Albumin: <1.5 g/dL — ABNORMAL LOW (ref 3.5–5.0)
Anion gap: 13 (ref 5–15)
BUN: 78 mg/dL — ABNORMAL HIGH (ref 8–23)
CO2: 20 mmol/L — ABNORMAL LOW (ref 22–32)
Calcium: 8.2 mg/dL — ABNORMAL LOW (ref 8.9–10.3)
Chloride: 116 mmol/L — ABNORMAL HIGH (ref 98–111)
Creatinine, Ser: 5.03 mg/dL — ABNORMAL HIGH (ref 0.61–1.24)
GFR, Estimated: 11 mL/min — ABNORMAL LOW (ref >60)
Glucose, Bld: 195 mg/dL — ABNORMAL HIGH (ref 70–99)
Phosphorus: 4.4 mg/dL (ref 2.5–4.6)
Potassium: 3.6 mmol/L (ref 3.5–5.1)
Sodium: 149 mmol/L — ABNORMAL HIGH (ref 135–145)

## 2021-10-17 LAB — CBC
HCT: 30.9 % — ABNORMAL LOW (ref 39.0–52.0)
Hemoglobin: 9.7 g/dL — ABNORMAL LOW (ref 13.0–17.0)
MCH: 26.7 pg (ref 26.0–34.0)
MCHC: 31.4 g/dL (ref 30.0–36.0)
MCV: 85.1 fL (ref 80.0–100.0)
Platelets: 213 10*3/uL (ref 150–400)
RBC: 3.63 MIL/uL — ABNORMAL LOW (ref 4.22–5.81)
RDW: 17.2 % — ABNORMAL HIGH (ref 11.5–15.5)
WBC: 16.3 10*3/uL — ABNORMAL HIGH (ref 4.0–10.5)
nRBC: 0.1 % (ref 0.0–0.2)

## 2021-10-17 LAB — GLUCOSE, CAPILLARY
Glucose-Capillary: 206 mg/dL — ABNORMAL HIGH (ref 70–99)
Glucose-Capillary: 266 mg/dL — ABNORMAL HIGH (ref 70–99)
Glucose-Capillary: 309 mg/dL — ABNORMAL HIGH (ref 70–99)
Glucose-Capillary: 240 mg/dL — ABNORMAL HIGH (ref 70–99)

## 2021-10-17 NOTE — Progress Notes (Signed)
Patient ID: ERNESTINE LANGWORTHY, male   DOB: [birth year], 81 y.o.   MRN: [id number]    Progress Note from the Palliative Medicine Team at [facility]   Patient Name: [name]        Date: 10/17/2021 DOB: [birth year]  Age: 81 y.o. MRN#: [id number] Attending Physician: Barb Merino, MD Primary Care Physician: Burnard Bunting, MD Admit Date: 10/08/2021   Medical records reviewed    81 y.o. 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  Work-up in the emergency department showed rhabdomyolysis, L2 anterior superior fracture.  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  Hospital course remarkable for progressive worsening of mental status, declining kidney function.  Patient transferred from [facility] to [facility] for consideration of dialysis.  Nephrology following.  Continued physical, functional and cognitive decline and spite of full medical support.   Today patient is lethargic and difficult to arouse.  This NP visited patient at the bedside as a follow up for palliative medicine needs and emotional support and to meet as scheduled with patient's wife/[name], daughter/ [name] and his brother [name] for continued conversation regarding current medical situation;    treatment option decisions, advanced directive decisions and anticipatory care needs.   All persons participating in family meeting today verbalize an understanding of the complexity of the patient's multiple comorbidities specific to worsening renal function, large liver mass, spinal fracture as noted per neurosurgery, ongoing encephalopathy, generalized pain and discomfort, poor oral intake, hypoalbuminemia,  and overall failure to thrive.  Education offered on the associated poor prognosis  As a family unit all  verbalized that the patient actually has had significant physical and functional decline over the past 3 months.   Together we reviewed the patient's advance care planning documents, healthcare power of attorney and declaration for a natural death.  Education offered on the difference between aggressive medical intervention path and a palliative comfort path for this patient, at this time, in this situation.  Education offered on the natural trajectory and expectations at end-of-life  Plan of care -DNR/DNI-documented today -Family understand patient is not a dialysis candidate. -Continue current treatment plan over the next several days, hope is that son/  [name] will be able to get to [facility] to visit his father in that time -Family meeting is scheduled for Sunday at 12 noon to further delineate next steps in plan of care  Patient is high risk for decompensation  Education offered on the possibility that anything could happen at any time.   Education on hospice benefit; philosophy and eligibility.   Detailed residential hospice benefit and associated prognosis of less than 2 weeks.  Questions and concerns addressed     Discussed with treatment team via secure chat  This nurse practitioner informed  the family and the attending that I will be out of the hospital until Sunday morning.    Call palliative medicine team phone # [phone] with questions or concerns in the interim    Wadie Lessen NP  Palliative Medicine Team Team Phone # [phone] Pager [phone]

## 2021-10-17 NOTE — Progress Notes (Signed)
Subjective: Patient reports that his pain is slightly better today. No acute events reported overnight.   Objective: Vital signs in last 24 hours: Temp:  [98 F (36.7 C)-99.8 F (37.7 C)] 98 F (36.7 C) (06/07 0430) Pulse Rate:  [74-89] 77 (06/07 0430) Resp:  [18-20] 18 (06/07 0430) BP: (122-141)/(51-87) 141/67 (06/07 0430) SpO2:  [91 %-96 %] 96 % (06/07 0430) Weight:  [80.7 kg] 80.7 kg (06/07 0430)  Intake/Output from previous day: 06/06 0701 - 06/07 0700 In: 533.9 [P.O.:120; I.V.:413.9] Out: 1300 [Urine:1300] Intake/Output this shift: No intake/output data recorded.    Physical Exam: Patient is awake, confused, and oriented to person only. He is able to follow commands in his BUE and BLE. Eyes open spontaneously. They are in NAD. MAEW with good strength that is symmetric bilaterally.  BUE 5/5 throughout, BLE 5/5 throughout. Sensation to light touch is intact. PERLA, EOMI. CNs grossly intact.   Lab Results: Recent Labs    10/16/21 0457 10/17/21 0427  WBC 13.0* 16.3*  HGB 8.9* 9.7*  HCT 27.4* 30.9*  PLT 226 213   BMET Recent Labs    10/16/21 1521 10/17/21 0427  NA 151* 149*  K 3.5 3.6  CL 122* 116*  CO2 20* 20*  GLUCOSE 224* 195*  BUN 80* 78*  CREATININE 4.83* 5.03*  CALCIUM 8.1* 8.2*    Studies/Results: MR LUMBAR SPINE WO CONTRAST  Result Date: 10/15/2021 CLINICAL DATA:  81 year old male with recent fall. Back pain. L2 and L4 anterior superior endplate/osteophyte fractures in the setting of flowing endplate osteophytes and ankylosis on lumbar spine CT last month. Pain. Weakness. Large liver mass. EXAM: MRI LUMBAR SPINE WITHOUT CONTRAST TECHNIQUE: Multiplanar, multisequence MR imaging of the lumbar spine was performed. No intravenous contrast was administered. COMPARISON:  CT lumbar spine 10/08/2021. Abdomen MRI 10/09/2021. FINDINGS: Segmentation:  Normal on the comparison. Alignment: Lumbar lordosis is stable and relatively preserved. No significant  spondylolisthesis. Vertebrae: Fairly subtle marrow edema at the anterior superior L2 endplate associated with the recent CT finding. At L4, the dominant finding is a large tear of the L3-L4 disc, oblique on series 16, image 7. This tracks toward the L4 osteophyte fractures seen recently by CT. There is also a nondisplaced fracture of the tip of the L4 spinous process evident on series 15, image 8. No other marrow edema or acute osseous abnormality identified. Intact visible sacrum and SI joints. Normal background bone marrow signal. Conus medullaris and cauda equina: Conus extends to the T12-L1 level. No lower spinal cord or conus signal abnormality. Normal cauda equina nerve roots, with capacious spinal canal at most levels. Paraspinal and other soft tissues: Absent right kidney again noted. Large right liver mass partially visible, see abdomen MRI 10/09/2021. Disc levels: Visible lower thoracic levels through L2-L3 are negative for age, with underlying interbody ankylosis via flowing endplate osteophytes (traumatically interrupted at L1-L2 as seen on 10/08/2021. L3-L4: Long oblique tear of the disc tracking to the fractured anterior superior L4 osteophyte (series 15, image 7). Left far lateral broad-based disc bulge/protrusion (series 16, image 13). But no associated spinal stenosis. There is mild to moderate left L3 foraminal stenosis. L4-L5: Circumferential disc bulge with endplate spurring. Prominent left far lateral component here also. Mild to moderate facet and ligament flavum hypertrophy. Trace degenerative facet joint fluid. Mild spinal stenosis. Mild to moderate bilateral lateral recess stenosis (L5 nerve levels). Moderate to severe bilateral L4 foraminal stenosis. L5-S1: Negative disc.  Mild endplate spurring.  No stenosis. IMPRESSION: 1. Underlying lower thoracic  and upper lumbar interbody ankylosis from flowing endplate osteophytes. Acute fractures of the anterior superior endplates/osteophytes at both  L2 and L4 are better demonstrated on the recent CT. But this exam demonstrates a long tear of the L3-L4 disc, and a nondisplaced fracture of the L4 spinous process, in association with the L4 injury. No subsequent spinal stenosis at that level but bulky leftward/lateral disc herniation is associated with up to moderate left L3 neural foraminal stenosis. 2. Disc and posterior element degeneration at L4-L5 with mild spinal stenosis, mild to moderate bilateral lateral recess stenosis, and moderate to severe bilateral L4 foraminal stenosis. 3. Large right Liver mass, see Abdomen MRI 10/09/2021. Electronically Signed   By: Genevie Ann M.D.   On: 10/15/2021 09:46    Assessment/Plan: 81 y.o. 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 MRI of his lumbar spine revealed a tear in the ALL and disc injury of L3-4 with mild posterior ligamentous complex edema. He was initially instructed to don a TLSO brace, however, he has been unable to tolerate bracing. Due to his complex medical conditions, it was recommended that he undergo percutaneous fusion surgery with cement augmentation which would likely offer him the best clinical outcome with the least amount of risk. This morning, the patient's brother reported that the family continues to be unsure of how they would like to proceed. Family is to have a meeting with palliative care this afternoon to discuss goals of care. Will await the outcome of this meeting as to better guide potential neurosurgical intervention.    LOS: 9 days     Marvis Moeller, DNP, AGNP-C Neurosurgery Nurse Practitioner  [facility] [place] [address], [address], [place], [place] [address] P: [phone]    F: [phone]  10/17/2021 7:20 AM

## 2021-10-17 NOTE — Progress Notes (Signed)
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  Work-up in the emergency department showed rhabdomyolysis, L2 anterior superior corner fracture.  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  Hospital course remarkable for progressive worsening of mental status, declining kidney function.  Patient transferred from [facility] to [facility] for consideration of dialysis.  Nephrology closely following.  Neurosurgery also consulted for persistent back pain.  Remains in poor clinical status, Palliative meeting today.   Assessment & Plan:   Traumatic L2 vertebral fracture: Neurosurgery following.  MRI with multiple level fracture.  Not likely a surgical candidate given confusion and debility.  Supportive care.  Pain management.  See goal of care discussion below.  Acute kidney injury on CKD stage IV with rhabdomyolysis/hyperkalemia/NAGMA: Known baseline creatinine 3-4.1.  Absent right kidney.  Treated with IV fluids.  Currently on maintenance IV fluids and sodium bicarb. Given altered mental status, uremia suspected.  Patient is not a dialysis candidate given extreme weakness, multiple medical issues.  Conservative management.  Acute metabolic encephalopathy: Suspect secondary from uremia and also use of narcotics. Fall precautions.  Delirium precautions.  Will use low-dose of fentanyl or Dilaudid for pain control and comfort.  Liver mass with abnormal LFTs: Abdominal ultrasound showed right hepatic lobe mass about 12 cm in size.  MRI confirmed right hepatic lobe mass suspicious for isolated melanoma metastasis.  If clinically better, he will be followed up as  outpatient.  Type 2 diabetes: Recent A1c 8.8.  On Tarceva at home.  On sliding scale insulin now.  Dysphagia: Aspiration precautions.  Full liquid diet.  Goal of care discussion: Extreme debility and dysphagia, failure to thrive. Worsening renal functions and patient not candidate for hemodialysis. New diagnosis of liver cancer. Patient will benefit with home palliation or hospice, family meeting today scheduled with palliative care team.  We will follow-up.   DVT prophylaxis: SCDs Start: 10/08/21 1541   Code Status: Full code, palliative meeting today Family Communication: Brother at the bedside Disposition Plan: Status is: Inpatient Remains inpatient appropriate because: Altered mental status, worsening renal functions     Consultants:  Palliative Nephrology Neurosurgery  Procedures:  None  Antimicrobials:  None   Subjective: Patient seen and examined.  His brother was at the bedside.  Patient was minimally interactive, complains of pain in the back and also cannot move right hand due to pain.  He tells me that he was fine. Nursing reported giving him a dose of fentanyl last night and had some rest after that.  Family expecting meeting with palliative care team today.  Objective: Vitals:   10/16/21 1627 10/16/21 2056 10/17/21 0430 10/17/21 0946  BP: (!) 128/51 122/87 (!) 141/67 113/61  Pulse: 89 74 77 72  Resp: '20 18 18 19  '$ Temp: 99.8 F (37.7 C) 98 F (36.7 C) 98 F (36.7 C) 98.1 F (36.7 C)  TempSrc: Oral     SpO2: 96% 91% 96% 98%  Weight:   80.7 kg   Height:        Intake/Output Summary (Last 24 hours) at 10/17/2021 1108 Last data filed at 10/17/2021 0800 Gross per 24 hour  Intake 533.85 ml  Output 1100 ml  Net -  566.15 ml   Filed Weights   10/15/21 0500 10/16/21 0430 10/17/21 0430  Weight: 83.1 kg 79 kg 80.7 kg    Examination:  General exam: Debilitated.  Frail.  Not in any distress at rest.  Painful on mobility. Respiratory system: Clear to  auscultation. Respiratory effort normal.  No added sounds. Cardiovascular system: S1 & S2 heard, RRR.  1+ bilateral pedal edema. Right forearm and hand with painful swelling and pitting edema. Gastrointestinal system: Soft.  Nontender.  Bowel sound present. Central nervous system: Alert and awake.  Oriented x1-2.  Pleasant and confused.  Able to follow simple commands and answer simple questions but not appropriately answering as per brother at the bedside.     Data Reviewed: I have personally reviewed following labs and imaging studies  CBC: Recent Labs  Lab 10/14/21 0340 10/15/21 0051 10/16/21 0457 10/17/21 0427  WBC 14.7* 14.0* 13.0* 16.3*  NEUTROABS  --   --  10.3*  --   HGB 9.5* 8.9* 8.9* 9.7*  HCT 28.6* 27.5* 27.4* 30.9*  MCV 81.9 83.3 84.3 85.1  PLT 215 224 226 992   Basic Metabolic Panel: Recent Labs  Lab 10/14/21 0340 10/15/21 0051 10/16/21 0457 10/16/21 1521 10/17/21 0427  NA 148* 152* 153* 151* 149*  K 3.7 3.5 3.5 3.5 3.6  CL 118* 123* 125* 122* 116*  CO2 20* 20* 21* 20* 20*  GLUCOSE 233* 202* 255* 224* 195*  BUN 73* 72* 78* 80* 78*  CREATININE 4.00* 4.19* 4.40* 4.83* 5.03*  CALCIUM 8.1* 8.2* 8.1* 8.1* 8.2*  PHOS  --   --  3.7  --  4.4   GFR: Estimated Creatinine Clearance: 12.4 mL/min (A) (by C-G formula based on SCr of 5.03 mg/dL (H)). Liver Function Tests: Recent Labs  Lab 10/14/21 0340 10/16/21 0457 10/17/21 0427  AST 145*  --   --   ALT 73*  --   --   ALKPHOS 603*  --   --   BILITOT 1.2  --   --   PROT 5.4*  --   --   ALBUMIN 1.6* <1.5* <1.5*   No results for input(s): LIPASE, AMYLASE in the last 168 hours. No results for input(s): AMMONIA in the last 168 hours. Coagulation Profile: No results for input(s): INR, PROTIME in the last 168 hours. Cardiac Enzymes: Recent Labs  Lab 10/11/21 0625  CKTOTAL 327   BNP (last 3 results) No results for input(s): PROBNP in the last 8760 hours. HbA1C: No results for input(s): HGBA1C in the last  72 hours. CBG: Recent Labs  Lab 10/16/21 1137 10/16/21 1311 10/16/21 1626 10/16/21 2056 10/17/21 0737  GLUCAP 275* 269* 195* 142* 206*   Lipid Profile: No results for input(s): CHOL, HDL, LDLCALC, TRIG, CHOLHDL, LDLDIRECT in the last 72 hours. Thyroid Function Tests: No results for input(s): TSH, T4TOTAL, FREET4, T3FREE, THYROIDAB in the last 72 hours. Anemia Panel: No results for input(s): VITAMINB12, FOLATE, FERRITIN, TIBC, IRON, RETICCTPCT in the last 72 hours. Sepsis Labs: No results for input(s): PROCALCITON, LATICACIDVEN in the last 168 hours.  Recent Results (from the past 240 hour(s))  Resp Panel by RT-PCR (Flu A&B, Covid) Anterior Nasal Swab     Status: None   Collection Time: 10/08/21 11:09 AM   Specimen: Anterior Nasal Swab  Result Value Ref Range Status   SARS Coronavirus 2 by RT PCR NEGATIVE NEGATIVE Final    Comment: (NOTE) SARS-CoV-2 target nucleic acids are NOT DETECTED.  The SARS-CoV-2 RNA is generally detectable in upper  respiratory specimens during the acute phase of infection. The lowest concentration of SARS-CoV-2 viral copies this assay can detect is 138 copies/mL. A negative result does not preclude SARS-Cov-2 infection and should not be used as the sole basis for treatment or other patient management decisions. A negative result may occur with  improper specimen collection/handling, submission of specimen other than nasopharyngeal swab, presence of viral mutation(s) within the areas targeted by this assay, and inadequate number of viral copies(<138 copies/mL). A negative result must be combined with clinical observations, patient history, and epidemiological information. The expected result is Negative.  Fact Sheet for Patients:  EntrepreneurPulse.com.au  Fact Sheet for Healthcare Providers:  IncredibleEmployment.be  This test is no t yet approved or cleared by the [place] FDA and  has been authorized  for detection and/or diagnosis of SARS-CoV-2 by FDA under an Emergency Use Authorization (EUA). This EUA will remain  in effect (meaning this test can be used) for the duration of the COVID-19 declaration under Section 564(b)(1) of the Act, 21 U.S.C.section 360bbb-3(b)(1), unless the authorization is terminated  or revoked sooner.       Influenza A by PCR NEGATIVE NEGATIVE Final   Influenza B by PCR NEGATIVE NEGATIVE Final    Comment: (NOTE) The Xpert Xpress SARS-CoV-2/FLU/RSV plus assay is intended as an aid in the diagnosis of influenza from Nasopharyngeal swab specimens and should not be used as a sole basis for treatment. Nasal washings and aspirates are unacceptable for Xpert Xpress SARS-CoV-2/FLU/RSV testing.  Fact Sheet for Patients: EntrepreneurPulse.com.au  Fact Sheet for Healthcare Providers: IncredibleEmployment.be  This test is not yet approved or cleared by the [place] FDA and has been authorized for detection and/or diagnosis of SARS-CoV-2 by FDA under an Emergency Use Authorization (EUA). This EUA will remain in effect (meaning this test can be used) for the duration of the COVID-19 declaration under Section 564(b)(1) of the Act, 21 U.S.C. section 360bbb-3(b)(1), unless the authorization is terminated or revoked.  Performed at [facility], [place] [address], [place], [place] [address]          Radiology Studies: No results found.      Scheduled Meds:  Chlorhexidine Gluconate Cloth  6 each Topical Q0600   dorzolamide-timolol  1 drop Both Eyes BID   feeding supplement  1 Container Oral TID BM   feeding supplement  237 mL Oral TID BM   insulin aspart  0-15 Units Subcutaneous TID WC   insulin glargine-yfgn  10 Units Subcutaneous Daily   isosorbide dinitrate  20 mg Oral TID   latanoprost  1 drop Both Eyes QHS   leptospermum manuka honey  1 application. Topical Daily   lidocaine  1 patch  Transdermal Q24H   nebivolol  10 mg Oral Daily   pantoprazole (PROTONIX) IV  40 mg Intravenous Daily   rosuvastatin  20 mg Oral QHS   Continuous Infusions:  dextrose 50 mL/hr at 10/16/21 0111     LOS: 9 days    Time spent: 35 minutes    Barb Merino, MD [facility] Pager [phone]

## 2021-10-17 NOTE — Progress Notes (Signed)
Patient ID: [name], male   DOB: 10-07-1940, 81 y.o.   MRN: [id number] S: family to meet with palliative care today. They have decided not to proceed with surgery and focus on comfort at this time. O:BP 113/61 (BP Location: Left Arm)   Pulse 72   Temp 98.1 F (36.7 C)   Resp 19   Ht 6' (1.829 m)   Wt 80.7 kg   SpO2 98%   BMI 24.13 kg/m   Intake/Output Summary (Last 24 hours) at 10/17/2021 1222 Last data filed at 10/17/2021 0800 Gross per 24 hour  Intake 533.85 ml  Output 1100 ml  Net -566.15 ml   Intake/Output: I/O last 3 completed shifts: In: 1167.9 [P.O.:170; I.V.:997.9] Out: 2255 [Urine:2255]  Intake/Output this shift:  Total I/O In: 120 [P.O.:120] Out: -  Weight change: 1.7 kg IRJ:JOAC awake this am, able to open eyes and respond to questions with one word answers CVS:RRR, no rub Resp: occ rhonchi Abd: +BS< soft, NT/ND Ext: no edema  Recent Labs  Lab 10/12/21 0524 10/13/21 0956 10/14/21 0340 10/15/21 0051 10/16/21 0457 10/16/21 1521 10/17/21 0427  NA 142 144 148* 152* 153* 151* 149*  K 4.5 3.8 3.7 3.5 3.5 3.5 3.6  CL 112* 113* 118* 123* 125* 122* 116*  CO2 21* 18* 20* 20* 21* 20* 20*  GLUCOSE 133* 134* 233* 202* 255* 224* 195*  BUN 77* 75* 73* 72* 78* 80* 78*  CREATININE 4.40* 4.10* 4.00* 4.19* 4.40* 4.83* 5.03*  ALBUMIN  --   --  1.6*  --  <1.5*  --  <1.5*  CALCIUM 8.1* 8.1* 8.1* 8.2* 8.1* 8.1* 8.2*  PHOS  --   --   --   --  3.7  --  4.4  AST  --   --  145*  --   --   --   --   ALT  --   --  73*  --   --   --   --    Liver Function Tests: Recent Labs  Lab 10/14/21 0340 10/16/21 0457 10/17/21 0427  AST 145*  --   --   ALT 73*  --   --   ALKPHOS 603*  --   --   BILITOT 1.2  --   --   PROT 5.4*  --   --   ALBUMIN 1.6* <1.5* <1.5*   No results for input(s): LIPASE, AMYLASE in the last 168 hours. No results for input(s): AMMONIA in the last 168 hours. CBC: Recent Labs  Lab 10/14/21 0340 10/15/21 0051 10/16/21 0457 10/17/21 0427  WBC  14.7* 14.0* 13.0* 16.3*  NEUTROABS  --   --  10.3*  --   HGB 9.5* 8.9* 8.9* 9.7*  HCT 28.6* 27.5* 27.4* 30.9*  MCV 81.9 83.3 84.3 85.1  PLT 215 224 226 213   Cardiac Enzymes: Recent Labs  Lab 10/11/21 0625  CKTOTAL 327   CBG: Recent Labs  Lab 10/16/21 1311 10/16/21 1626 10/16/21 2056 10/17/21 0737 10/17/21 1123  GLUCAP 269* 195* 142* 206* 266*    Iron Studies: No results for input(s): IRON, TIBC, TRANSFERRIN, FERRITIN in the last 72 hours. Studies/Results: No results found.  Chlorhexidine Gluconate Cloth  6 each Topical Q0600   dorzolamide-timolol  1 drop Both Eyes BID   feeding supplement  1 Container Oral TID BM   feeding supplement  237 mL Oral TID BM   insulin aspart  0-15 Units Subcutaneous TID WC   insulin glargine-yfgn  10 Units  Subcutaneous Daily   isosorbide dinitrate  20 mg Oral TID   latanoprost  1 drop Both Eyes QHS   leptospermum manuka honey  1 application. Topical Daily   lidocaine  1 patch Transdermal Q24H   nebivolol  10 mg Oral Daily   pantoprazole (PROTONIX) IV  40 mg Intravenous Daily   rosuvastatin  20 mg Oral QHS    BMET    Component Value Date/Time   NA 149 (H) 10/17/2021 0427   NA 141 11/20/2012 1028   K 3.6 10/17/2021 0427   K 4.2 11/20/2012 1028   CL 116 (H) 10/17/2021 0427   CL 104 10/26/2012 1011   CO2 20 (L) 10/17/2021 0427   CO2 25 11/20/2012 1028   GLUCOSE 195 (H) 10/17/2021 0427   GLUCOSE 143 (H) 11/20/2012 1028   GLUCOSE 304 (H) 10/26/2012 1011   BUN 78 (H) 10/17/2021 0427   BUN 25.4 11/20/2012 1028   CREATININE 5.03 (H) 10/17/2021 0427   CREATININE 1.6 (H) 11/20/2012 1028   CALCIUM 8.2 (L) 10/17/2021 0427   CALCIUM 9.3 11/20/2012 1028   GFRNONAA 11 (L) 10/17/2021 0427   GFRAA 34 (L) 12/02/2018 0528   CBC    Component Value Date/Time   WBC 16.3 (H) 10/17/2021 0427   RBC 3.63 (L) 10/17/2021 0427   HGB 9.7 (L) 10/17/2021 0427   HGB 14.7 10/26/2012 1011   HCT 30.9 (L) 10/17/2021 0427   HCT 43.2 10/26/2012 1011    PLT 213 10/17/2021 0427   PLT 167 10/26/2012 1011   MCV 85.1 10/17/2021 0427   MCV 89.7 10/26/2012 1011   MCH 26.7 10/17/2021 0427   MCHC 31.4 10/17/2021 0427   RDW 17.2 (H) 10/17/2021 0427   RDW 13.6 10/26/2012 1011   LYMPHSABS 1.1 10/16/2021 0457   LYMPHSABS 1.7 10/26/2012 1011   MONOABS 1.3 (H) 10/16/2021 0457   MONOABS 0.6 10/26/2012 1011   EOSABS 0.2 10/16/2021 0457   EOSABS 0.2 10/26/2012 1011   BASOSABS 0.0 10/16/2021 0457   BASOSABS 0.1 10/26/2012 1011   Assessment/ Plan: AKI on CKD IV - b/l creat 3.1- 3.7, eGFR 15-20.  Creat 3.1 on admission 5/29 in setting of fall, L2 fracture and mild/ mod acute rhabdo.  Pt f/b Dr Candiss Norse at [facility]. Abd [place] > absent R kidney, L kidney w/o obstruction. UA showed myoglobin (low rbc/ high Hb). Suspect mild rhabdomyolysis-related ATN as cause of AKI. Creat peaked at 4.4 then improved down to 4.0 but back to 4.4 today.    Has not been eating or drinking for past 48 hours and likely has some pre-renal component.  Was started on D5W due to hypernatremia but would prefer D51/2 NS to help with renal perfusion.  Will start IVF's and follow UOP and Scr if Na has improved with D5.  Not a suitable candidate for dialysis and recommend palliative care consult to help set goals/limits of care as he is still full code.  SP fall / L2 fracture - found down at home, acute back pain is signicant issue still.  Family divided about Le-4 fusion by neurosurgery and are talking with palliative as well. Hypernatremia - due to poor po intake and consistent with dehydration.  He was started on D5 but may benefit from D5 1/2 NS given poor po intake and low albumin.  Will recheck Na now and decide whether to change to 1/2NS if it normalizes.  Push po as tolerated Hx of MGUS - serum free LC's, SPEP and UPEP ordered.  Light chains  normal, + bence jones + lamda type, IgA monoclonal protien in urine, M spike 5.3%.  could have evolved into multiple myeloma. HTN - getting BB and norvasc,  BP's good. Avoid hypotension, keep SBP > 115-120 please w/ AKI Volume - mild edema, CXR 5/29 neg. Good UOP, has condom cath now. DC ivf MGUS - as above Liver mass - per primary team.  Suspicious for metastatic melanoma per Rad. DM2 Disposition - poor overall prognosis and family to meet with Palliative care.  Likely moving towards comfort measures.   Donetta Potts, MD [facility]

## 2021-10-18 LAB — GLUCOSE, CAPILLARY
Glucose-Capillary: 211 mg/dL — ABNORMAL HIGH (ref 70–99)
Glucose-Capillary: 359 mg/dL — ABNORMAL HIGH (ref 70–99)
Glucose-Capillary: 305 mg/dL — ABNORMAL HIGH (ref 70–99)
Glucose-Capillary: 232 mg/dL — ABNORMAL HIGH (ref 70–99)

## 2021-10-18 NOTE — Progress Notes (Signed)
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  Work-up in the emergency department showed rhabdomyolysis, L2 anterior superior corner fracture.  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  Hospital course remarkable for progressive worsening of mental status, declining kidney function.  Patient transferred from [facility] to [facility] for consideration of dialysis.  Nephrology closely following.  Neurosurgery also consulted for persistent back pain.  Remains in poor clinical status,    Assessment & Plan:   Traumatic L2 vertebral fracture: Neurosurgery following.  MRI with multiple level fracture.  Not likely a surgical candidate given confusion and debility.  Supportive care.  Pain management.  See goal of care discussion below.  Acute kidney injury on CKD stage IV with rhabdomyolysis/hyperkalemia/NAGMA: Known baseline creatinine 3-4.1.  Absent right kidney.  Treated with IV fluids.  Currently on maintenance IV fluids and sodium bicarb. Given altered mental status, uremia suspected.  Patient is not a dialysis candidate given extreme weakness, multiple medical issues.  Conservative management.  Acute metabolic encephalopathy: Suspect secondary from uremia and also use of narcotics. Fall precautions.  Delirium precautions.  Will use low-dose of fentanyl or Dilaudid for pain control and comfort.  Liver mass with abnormal LFTs: Abdominal ultrasound showed right hepatic lobe mass about 12 cm in size.  MRI confirmed right hepatic lobe mass suspicious for isolated melanoma metastasis.  He is less likely to survive.  Type 2 diabetes: Recent A1c 8.8.  On Tarceva at  home.  On sliding scale insulin now.  Dysphagia: Aspiration precautions.  Full liquid diet.  Poor prognosis.  Goal of care discussion: Extreme debility and dysphagia, failure to thrive. Worsening renal functions and patient not candidate for hemodialysis. New diagnosis of liver cancer. Patient will benefit with home hospice that is our recommendations. 6/8 Brother at the bedside, wife Chrys Racer on the phone.  We discussed about declining status. Plan is to continue supportive care, IV fluids, check renal functions as well as continue to check blood sugars and treat until his family can convene by the weekend. No escalation of care.  Do not transfer to ICU or higher level of care.  We have made clear goal of care discussion that if patient were to decompensate or deteriorate or become uncomfortable will treat him as comfort care.  Use IV fentanyl and oxycodone for pain relief in distress.   DVT prophylaxis: SCDs Start: 10/08/21 1541   Code Status: DNR/DNI Family Communication: Brother at the bedside, wife on the phone. Disposition Plan: Status is: Inpatient Remains inpatient appropriate because: Altered mental status, worsening renal functions     Consultants:  Palliative Nephrology Neurosurgery  Procedures:  None  Antimicrobials:  None   Subjective: Patient seen and examined.  Patient looking comfortable, answers basic questions.  He declines to use his right hand because it is painful.  He had some wakefulness and was able to eat a few bites of meal however otherwise remains tired and lethargic. Not able to express any concerns.  Brother at the bedside.  Objective: Vitals:   10/17/21 1631 10/17/21 2051 10/18/21 0553 10/18/21 0928  BP: (!) 119/52 130/61 (!) 126/59 (!) 108/54  Pulse: 73 75 72 70  Resp: 19 16  18 19  Temp: 98.2 F (36.8 C) 97.9 F (36.6 C) 98 F (36.7 C) 97.8 F (36.6 C)  TempSrc:   Axillary Oral  SpO2: 97% 94% 100% 96%  Weight:   82 kg   Height:         Intake/Output Summary (Last 24 hours) at 10/18/2021 1050 Last data filed at 10/18/2021 0250 Gross per 24 hour  Intake 1503.11 ml  Output 750 ml  Net 753.11 ml   Filed Weights   10/16/21 0430 10/17/21 0430 10/18/21 0553  Weight: 79 kg 80.7 kg 82 kg    Examination:  General exam: Frail and debilitated.  Sick looking.  Lethargic. Respiratory system: Clear to auscultation. Respiratory effort normal.  No added sounds. Cardiovascular system: S1 & S2 heard, RRR.  1+ bilateral pedal edema. Right forearm and hand with painful swelling and pitting edema. Gastrointestinal system: Soft.  Nontender.  Bowel sound present. Central nervous system: Alert and awake.  Oriented x1-2.  Pleasant and confused.  Able to follow simple commands and answer simple questions but confused.     Data Reviewed: I have personally reviewed following labs and imaging studies  CBC: Recent Labs  Lab 10/14/21 0340 10/15/21 0051 10/16/21 0457 10/17/21 0427  WBC 14.7* 14.0* 13.0* 16.3*  NEUTROABS  --   --  10.3*  --   HGB 9.5* 8.9* 8.9* 9.7*  HCT 28.6* 27.5* 27.4* 30.9*  MCV 81.9 83.3 84.3 85.1  PLT 215 224 226 409   Basic Metabolic Panel: Recent Labs  Lab 10/14/21 0340 10/15/21 0051 10/16/21 0457 10/16/21 1521 10/17/21 0427  NA 148* 152* 153* 151* 149*  K 3.7 3.5 3.5 3.5 3.6  CL 118* 123* 125* 122* 116*  CO2 20* 20* 21* 20* 20*  GLUCOSE 233* 202* 255* 224* 195*  BUN 73* 72* 78* 80* 78*  CREATININE 4.00* 4.19* 4.40* 4.83* 5.03*  CALCIUM 8.1* 8.2* 8.1* 8.1* 8.2*  PHOS  --   --  3.7  --  4.4   GFR: Estimated Creatinine Clearance: 12.4 mL/min (A) (by C-G formula based on SCr of 5.03 mg/dL (H)). Liver Function Tests: Recent Labs  Lab 10/14/21 0340 10/16/21 0457 10/17/21 0427  AST 145*  --   --   ALT 73*  --   --   ALKPHOS 603*  --   --   BILITOT 1.2  --   --   PROT 5.4*  --   --   ALBUMIN 1.6* <1.5* <1.5*   No results for input(s): "LIPASE", "AMYLASE" in the last 168 hours. No results  for input(s): "AMMONIA" in the last 168 hours. Coagulation Profile: No results for input(s): "INR", "PROTIME" in the last 168 hours. Cardiac Enzymes: No results for input(s): "CKTOTAL", "CKMB", "CKMBINDEX", "TROPONINI" in the last 168 hours.  BNP (last 3 results) No results for input(s): "PROBNP" in the last 8760 hours. HbA1C: No results for input(s): "HGBA1C" in the last 72 hours. CBG: Recent Labs  Lab 10/17/21 0737 10/17/21 1123 10/17/21 1619 10/17/21 2051 10/18/21 0717  GLUCAP 206* 266* 309* 240* 211*   Lipid Profile: No results for input(s): "CHOL", "HDL", "LDLCALC", "TRIG", "CHOLHDL", "LDLDIRECT" in the last 72 hours. Thyroid Function Tests: No results for input(s): "TSH", "T4TOTAL", "FREET4", "T3FREE", "THYROIDAB" in the last 72 hours. Anemia Panel: No results for input(s): "VITAMINB12", "FOLATE", "FERRITIN", "TIBC", "IRON", "RETICCTPCT" in the last 72 hours. Sepsis Labs: No results for input(s): "PROCALCITON", "LATICACIDVEN" in the last 168 hours.  Recent Results (from the past 240 hour(s))  Resp Panel  by RT-PCR (Flu A&B, Covid) Anterior Nasal Swab     Status: None   Collection Time: 10/08/21 11:09 AM   Specimen: Anterior Nasal Swab  Result Value Ref Range Status   SARS Coronavirus 2 by RT PCR NEGATIVE NEGATIVE Final    Comment: (NOTE) SARS-CoV-2 target nucleic acids are NOT DETECTED.  The SARS-CoV-2 RNA is generally detectable in upper respiratory specimens during the acute phase of infection. The lowest concentration of SARS-CoV-2 viral copies this assay can detect is 138 copies/mL. A negative result does not preclude SARS-Cov-2 infection and should not be used as the sole basis for treatment or other patient management decisions. A negative result may occur with  improper specimen collection/handling, submission of specimen other than nasopharyngeal swab, presence of viral mutation(s) within the areas targeted by this assay, and inadequate number of  viral copies(<138 copies/mL). A negative result must be combined with clinical observations, patient history, and epidemiological information. The expected result is Negative.  Fact Sheet for Patients:  EntrepreneurPulse.com.au  Fact Sheet for Healthcare Providers:  IncredibleEmployment.be  This test is no t yet approved or cleared by the [place] FDA and  has been authorized for detection and/or diagnosis of SARS-CoV-2 by FDA under an Emergency Use Authorization (EUA). This EUA will remain  in effect (meaning this test can be used) for the duration of the COVID-19 declaration under Section 564(b)(1) of the Act, 21 U.S.C.section 360bbb-3(b)(1), unless the authorization is terminated  or revoked sooner.       Influenza A by PCR NEGATIVE NEGATIVE Final   Influenza B by PCR NEGATIVE NEGATIVE Final    Comment: (NOTE) The Xpert Xpress SARS-CoV-2/FLU/RSV plus assay is intended as an aid in the diagnosis of influenza from Nasopharyngeal swab specimens and should not be used as a sole basis for treatment. Nasal washings and aspirates are unacceptable for Xpert Xpress SARS-CoV-2/FLU/RSV testing.  Fact Sheet for Patients: EntrepreneurPulse.com.au  Fact Sheet for Healthcare Providers: IncredibleEmployment.be  This test is not yet approved or cleared by the [place] FDA and has been authorized for detection and/or diagnosis of SARS-CoV-2 by FDA under an Emergency Use Authorization (EUA). This EUA will remain in effect (meaning this test can be used) for the duration of the COVID-19 declaration under Section 564(b)(1) of the Act, 21 U.S.C. section 360bbb-3(b)(1), unless the authorization is terminated or revoked.  Performed at [facility], [place] [address] [place], [place] [address]          Radiology Studies: No results found.      Scheduled Meds:  Chlorhexidine  Gluconate Cloth  6 each Topical Q0600   dorzolamide-timolol  1 drop Both Eyes BID   feeding supplement  1 Container Oral TID BM   feeding supplement  237 mL Oral TID BM   insulin aspart  0-15 Units Subcutaneous TID WC   insulin glargine-yfgn  10 Units Subcutaneous Daily   isosorbide dinitrate  20 mg Oral TID   latanoprost  1 drop Both Eyes QHS   leptospermum manuka honey  1 application. Topical Daily   lidocaine  1 patch Transdermal Q24H   nebivolol  10 mg Oral Daily   pantoprazole (PROTONIX) IV  40 mg Intravenous Daily   rosuvastatin  20 mg Oral QHS   Continuous Infusions:  dextrose 50 mL/hr at 10/18/21 0429     LOS: 10 days    Time spent: 35 minutes    Barb Merino, MD [facility] Pager [phone]

## 2021-10-18 NOTE — Progress Notes (Signed)
Speech Language Pathology Treatment: Dysphagia  Patient Details Name: [name] MRN: [id number] DOB: February 04, 1941 Today's Date: 10/18/2021 Time: 9794-8016 SLP Time Calculation (min) (ACUTE ONLY): 20 min  Assessment / Plan / Recommendation Clinical Impression  Patient seen by SLP for skilled treatment session focused on dysphagia goals and family education. Patient's spouse and his brother both in room. Family reports that patient at entire bowl of grits this morning and has been enjoying applesauce and pudding. Patient was lethargic but awake and alert enough for small amount of chocolate pudding and sips of water via straw. He exhibited oral holding, prolonged bolus transit with pudding but sips of water helped clear oral cavity. No overt s/s aspiration or penetration observed. SLP spoke with family regarding slight upgrade to Dys 1 solids (has been on full liquids) in order to give patient some more variety with puree/pudding type PO's. Patient is transitioning to comfort care measures and so SLP will s/o at this time.      HPI HPI: [name] is an 81 y.o. 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 Per chart MRI of the liver done that showed right hepatic lobe which is new with precontrast T1 hyperintense density which favors isolated melanoma. Pt had difficulty swallowing his pills morning of 6/2 and swallow eval ordered.      SLP Plan  Discharge SLP treatment due to (comment);All goals met      Recommendations for follow up therapy are one component of a multi-disciplinary  discharge planning process, led by the attending physician.  Recommendations may be updated based on patient status, additional functional criteria and insurance authorization.    Recommendations  Diet recommendations: Dysphagia 1 (puree);Thin liquid Liquids provided via: Straw Medication Administration: Other (Comment) (IV preferred, crushed in puree otherwise) Compensations: Small sips/bites;Minimize environmental distractions;Slow rate Postural Changes and/or Swallow Maneuvers: Seated upright 90 degrees                Oral Care Recommendations: Oral care QID;Staff/trained caregiver to provide oral care;Oral care before and after PO Follow Up Recommendations: No SLP follow up Assistance recommended at discharge: None SLP Visit Diagnosis: Dysphagia, unspecified (R13.10) Plan: Discharge SLP treatment due to (comment);All goals met           Sonia Baller, MA, CCC-SLP Speech Therapy

## 2021-10-18 NOTE — TOC Progression Note (Signed)
Transition of Care [facility]) - Initial/Assessment Note    Patient Details  Name: [name] MRN: [id number] Date of Birth: 1940-08-11  Transition of Care [facility]) CM/SW Contact:    Milinda Antis, LCSWA Phone Number: 10/18/2021, 11:02 AM  Clinical Narrative:                  Transition of Care Department [facility]) has reviewed patient.  The patient is now DNR and receiving limited interventions.  The patient's family will come in town over the weekend and a [place] meeting with palliative is scheduled for Sunday at noon.  TOC will continue to follow for d/c needs.    Expected Discharge Plan: Skilled Nursing Facility Barriers to Discharge: Continued Medical Work up, SNF Pending bed offer, Insurance Authorization   Patient Goals and CMS Choice Patient states their goals for this hospitalization and ongoing recovery are:: to eventually return home with wife      Expected Discharge Plan and Services Expected Discharge Plan: [place] In-house Referral: Clinical Social Work   Post Acute Care Choice: [place] Living arrangements for the past 2 months: Single Family Home                 DME Arranged: N/A DME Agency: NA                  Prior Living Arrangements/Services Living arrangements for the past 2 months: Single Family Home     Do you feel safe going back to the place where you live?: Yes      Need for Family Participation in Patient Care: Yes (Comment) Care giver support system in place?: Yes (comment)      Activities of Daily Living Home Assistive Devices/Equipment: None ADL Screening (condition at time of admission) Patient's cognitive ability adequate to safely complete daily activities?: Yes Is the patient deaf or have difficulty hearing?: No Does the patient have difficulty seeing, even when wearing glasses/contacts?: No Does the patient have difficulty concentrating, remembering, or making decisions?: No Patient able to  express need for assistance with ADLs?: Yes Does the patient have difficulty dressing or bathing?: No Independently performs ADLs?: Yes (appropriate for developmental age) Does the patient have difficulty walking or climbing stairs?: Yes Weakness of Legs: Both Weakness of Arms/Hands: None  Permission Sought/Granted Permission sought to share information with : Family Supports Permission granted to share information with : Yes, Verbal Permission Granted  Share Information with NAME: Rhone Ozaki     Permission granted to share info w Relationship: spouse  Permission granted to share info w Contact Information: [phone]  Emotional Assessment Appearance:: Appears stated age Attitude/Demeanor/Rapport: Lethargic Affect (typically observed): Flat Orientation: : Oriented to Self, Oriented to Place, Oriented to  Time, Oriented to Situation Alcohol / Substance Use: Not Applicable Psych Involvement: No (comment)  Admission diagnosis:  Elevated troponin [R77.8] Falls [W19.XXXA] Fall, initial encounter [id number]] Traumatic rhabdomyolysis, initial encounter ([place]) [T79.6XXA] Other closed fracture of second lumbar vertebra, initial encounter [facility]) [S32.028A] Patient Active Problem List   Diagnosis Date Noted   Pressure injury of skin 10/11/2021   Normal anion gap metabolic acidosis 24/26/8341   Fall at home, initial encounter 10/08/2021   Closed fracture of lumbar spine without lesion of spinal cord ([place]) 10/08/2021   GERD (gastroesophageal reflux disease) 10/08/2021   Elevated LFTs 10/08/2021   Rhabdomyolysis 10/08/2021   AKI (acute kidney injury) ([place]) 11/29/2018   Urinary tract obstruction by kidney stone 11/29/2018   Type 2 diabetes  mellitus with hyperlipidemia ([place]) 11/29/2018   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   Post PTCA 10/22/2016   Coronary artery disease of native artery of native heart with stable angina pectoris ([place]) 10/20/2016   NSTEMI (non-ST elevated myocardial infarction)  ([place]) 09/25/2016   MGUS (monoclonal gammopathy of unknown significance)    Primary hypertension    CKD (chronic kidney disease) stage 4, GFR 15-29 ml/min (HCC)    HLD (hyperlipidemia)    Borderline diabetes    Seborrheic keratosis    PCP:  Burnard Bunting, MD Pharmacy:   [place], [place] [address] Phone: [phone] Fax: [phone]     Social Determinants of Health (SDOH) Interventions    Readmission Risk Interventions    10/09/2021    2:37 PM  Readmission Risk Prevention Plan  Transportation Screening Complete  PCP or Specialist Appt within 5-7 Days Complete  Home Care Screening Complete  Medication Review (RN CM) Complete

## 2021-10-18 NOTE — Progress Notes (Signed)
OT Cancellation Note and Discharge  Patient Details Name: [name] MRN: [id number] DOB: 03/08/1941   Cancelled Treatment:    Reason Eval/Treat Not Completed:  Brother stating family is choosing comfort measures, declined therapy. Awaiting family member to arrive in town and hopeful for inpatient hospice. OT signing off.   [name] 10/18/2021, 10:54 AM Nestor Lewandowsky, OTR/L Acute Rehabilitation Services Pager: [phone] Office: [phone]

## 2021-10-18 NOTE — Progress Notes (Signed)
PT Cancellation Note  Patient Details Name: [name] MRN: [id number] DOB: 04/10/1941   Cancelled Treatment:    Reason Eval/Treat Not Completed: Other (comment) Brother stating family is choosing comfort measures, declined therapy. Awaiting family member to arrive in town and hopeful for inpatient hospice. PT signing off.   Jacy Brocker A. Gilford Rile PT, DPT Acute Rehabilitation Services Office [phone]    Linna Hoff 10/18/2021, 11:40 AM

## 2021-10-18 NOTE — Progress Notes (Signed)
Family leaning towards comfort care.  They understand he is not a dialysis candidate.  Nothing further to add.  Will sign off.  Please call with questions or concerns.

## 2021-10-18 NOTE — Progress Notes (Signed)
Subjective: Patient reports in bed in NAD. Patient's brother is at bedside.   Objective: Vital signs in last 24 hours: Temp:  [97.9 F (36.6 C)-98.2 F (36.8 C)] 98 F (36.7 C) (06/08 0553) Pulse Rate:  [72-75] 72 (06/08 0553) Resp:  [16-19] 18 (06/08 0553) BP: (113-130)/(52-61) 126/59 (06/08 0553) SpO2:  [94 %-100 %] 100 % (06/08 0553) Weight:  [82 kg] 82 kg (06/08 0553)  Intake/Output from previous day: 06/07 0701 - 06/08 0700 In: 1623.1 [P.O.:120; I.V.:1503.1] Out: 750 [Urine:750] Intake/Output this shift: No intake/output data recorded.  Physical Exam: Patient is awake, confused, and oriented to person only. He is able to follow commands in his BUE and BLE. Eyes open spontaneously. They are in NAD. MAEW with good strength that is symmetric bilaterally.  BUE 5/5 throughout, BLE 5/5 throughout. Sensation to light touch is intact. PERLA, EOMI. CNs grossly intact.   Lab Results: Recent Labs    10/16/21 0457 10/17/21 0427  WBC 13.0* 16.3*  HGB 8.9* 9.7*  HCT 27.4* 30.9*  PLT 226 213   BMET Recent Labs    10/16/21 1521 10/17/21 0427  NA 151* 149*  K 3.5 3.6  CL 122* 116*  CO2 20* 20*  GLUCOSE 224* 195*  BUN 80* 78*  CREATININE 4.83* 5.03*  CALCIUM 8.1* 8.2*    Studies/Results: No results found.  Assessment/Plan: 81 y.o. 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 MRI of his lumbar spine revealed a tear in the ALL and disc injury of L3-4 with mild posterior ligamentous complex edema. He was initially instructed to don a TLSO brace, however, he has been unable to tolerate bracing. Due to his complex medical conditions, it was recommended that he undergo percutaneous fusion surgery with cement augmentation which would likely offer him the best clinical outcome with the least amount of risk. Surgery was initially planned and then  family wanted to have more time to make a decision. Family met with palliative care team yesterday. At this point, they are wanting to continue to hold off on surgery. Awaiting arrival of the patient's son with follow up goals of care meeting on Sunday. Will likely transition to comfort care.     LOS: 10 days     Marvis Moeller, DNP, AGNP-C Neurosurgery Nurse Practitioner  [facility] [place] [address], [address], [place], [place] [address] P: [phone]    F: [phone]  10/18/2021 8:16 AM

## 2021-10-18 NOTE — Progress Notes (Signed)
Inpatient Diabetes Program Recommendations  AACE/ADA: New Consensus Statement on Inpatient Glycemic Control (2015)  Target Ranges:  Prepandial:   less than 140 mg/dL      Peak postprandial:   less than 180 mg/dL (1-2 hours)      Critically ill patients:  140 - 180 mg/dL   Lab Results  Component Value Date   GLUCAP 211 (H) 10/18/2021   HGBA1C 8.8 (H) 10/08/2021    Review of Glycemic Control  Latest Reference Range & Units 10/17/21 07:37 10/17/21 11:23 10/17/21 16:19 10/17/21 20:51 10/18/21 07:17  Glucose-Capillary 70 - 99 mg/dL 206 (H) 266 (H) 309 (H) 240 (H) 211 (H)   Diabetes history: DM 2 Outpatient Diabetes medications: Tresiba 10 units Daily Current orders for Inpatient glycemic control:  Novolog 0-15 units tid Semglee 10 units  Inpatient Diabetes Program Recommendations:    -  Increase Semglee to 15 units home dose  Thanks,  Tama Headings RN, MSN, BC-ADM Inpatient Diabetes Coordinator Team Pager [phone] (8a-5p)

## 2021-10-19 DIAGNOSIS — Z515 Encounter for palliative care: Secondary | ICD-10-CM

## 2021-10-19 LAB — GLUCOSE, CAPILLARY: Glucose-Capillary: 219 mg/dL — ABNORMAL HIGH (ref 70–99)

## 2021-10-19 LAB — BASIC METABOLIC PANEL
Anion gap: 12 (ref 5–15)
BUN: 106 mg/dL — ABNORMAL HIGH (ref 8–23)
CO2: 18 mmol/L — ABNORMAL LOW (ref 22–32)
Calcium: 8 mg/dL — ABNORMAL LOW (ref 8.9–10.3)
Chloride: 111 mmol/L (ref 98–111)
Creatinine, Ser: 7.21 mg/dL — ABNORMAL HIGH (ref 0.61–1.24)
GFR, Estimated: 7 mL/min — ABNORMAL LOW (ref >60)
Glucose, Bld: 220 mg/dL — ABNORMAL HIGH (ref 70–99)
Potassium: 4 mmol/L (ref 3.5–5.1)
Sodium: 141 mmol/L (ref 135–145)

## 2021-10-19 MED ORDER — HALOPERIDOL 0.5 MG PO TABS
0.5000 mg | ORAL_TABLET | ORAL | Status: DC | PRN
  Filled 2021-10-19: qty 1

## 2021-10-19 MED ORDER — PROCHLORPERAZINE EDISYLATE 10 MG/2ML IJ SOLN: 10.0000 mg | Freq: Two times a day (BID) | INTRAMUSCULAR | Status: DC | PRN

## 2021-10-19 MED ORDER — HALOPERIDOL LACTATE 2 MG/ML PO CONC
0.5000 mg | ORAL | Status: DC | PRN
  Filled 2021-10-19: qty 5

## 2021-10-19 MED ORDER — ALBUTEROL SULFATE (2.5 MG/3ML) 0.083% IN NEBU: 2.5000 mg | INHALATION_SOLUTION | RESPIRATORY_TRACT | Status: DC | PRN

## 2021-10-19 MED ORDER — HALOPERIDOL LACTATE 5 MG/ML IJ SOLN: 0.5000 mg | INTRAMUSCULAR | Status: DC | PRN

## 2021-10-19 MED ORDER — GLYCOPYRROLATE 1 MG PO TABS
1.0000 mg | ORAL_TABLET | ORAL | Status: DC | PRN
  Filled 2021-10-19: qty 1

## 2021-10-19 MED ORDER — LORAZEPAM 2 MG/ML PO CONC
1.0000 mg | ORAL | Status: DC | PRN
  Filled 2021-10-19: qty 0.5

## 2021-10-19 MED ORDER — GLYCOPYRROLATE 0.2 MG/ML IJ SOLN: 0.2000 mg | INTRAMUSCULAR | Status: DC | PRN

## 2021-10-19 MED ORDER — PROCHLORPERAZINE MALEATE 5 MG PO TABS
5.0000 mg | ORAL_TABLET | Freq: Four times a day (QID) | ORAL | Status: DC | PRN
  Filled 2021-10-19: qty 1

## 2021-10-19 MED ORDER — LORAZEPAM 1 MG PO TABS: 1.0000 mg | ORAL_TABLET | ORAL | Status: DC | PRN

## 2021-10-19 MED ORDER — FENTANYL CITRATE PF 50 MCG/ML IJ SOSY
25.0000 ug | PREFILLED_SYRINGE | INTRAMUSCULAR | Status: DC | PRN
  Administered 2021-10-20 – 2021-10-22 (×10): 25 ug via INTRAVENOUS
  Filled 2021-10-19 (×11): qty 1

## 2021-10-19 MED ORDER — PROCHLORPERAZINE 25 MG RE SUPP
25.0000 mg | Freq: Two times a day (BID) | RECTAL | Status: DC | PRN
  Filled 2021-10-19: qty 1

## 2021-10-19 MED ORDER — LORAZEPAM 2 MG/ML IJ SOLN: 1.0000 mg | INTRAMUSCULAR | Status: DC | PRN

## 2021-10-19 NOTE — Progress Notes (Signed)
Nutrition Brief Note  Chart reviewed. Per MD plan to continue supportive care but no escalation of care. Plan for family to convene over weekend and if pt decompensates will treat as comfort care.  Full nutrition assessment dated 6/6. No further nutrition plans at this time. Encourage PO intake as able.  Please re-consult as needed.   Lockie Pares., RD, LDN, CNSC See AMiON for contact information

## 2021-10-19 NOTE — Progress Notes (Signed)
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  Work-up in the emergency department showed rhabdomyolysis, L2 anterior superior corner fracture.  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  Hospital course remarkable for progressive worsening of mental status, declining kidney function.  Patient transferred from [facility] to [facility] for consideration of dialysis.  Nephrology closely following.  Neurosurgery also consulted for persistent back pain.  Remains in poor clinical status, 6/9, converted to comfort care and hospice.  See plan of care below.   Assessment & Plan:   Traumatic L2 vertebral fracture Acute kidney injury on CKD stage IV with rhabdomyolysis and hyperkalemia, uremia Acute metabolic encephalopathy, multifactorial.  Mostly from uremic symptoms. 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 Patient is more lethargic, unable to interact, unable to eat, distressed with any interactions or stimulation.  Patient is reaching end-of-life and expected to die soon.  After careful discussion with family, everyone agreed to treat him with comfort care and hospice level of care. Changed to comfort care and hospice Adequate pain medications with IV and oral opiates, manage secretions, manage anxiety. Provide end-of-life care Unrestricted visitor policy for end-of-life RN to pronounce death if happens in the hospital Hospital death anticipated, if remains stable next 24 hours and can be transferred  safely, will consider referral to inpatient hospice. Transfer to 6 N. palliative unit if bed available.  DVT prophylaxis:   Comfort care   Code Status: DNR/DNI, comfort care Family Communication: Wife, brother, daughter at the bedside Disposition Plan: Status is: Inpatient Remains inpatient appropriate because: End-of-life care     Consultants:  Palliative Nephrology Neurosurgery  Procedures:  None  Antimicrobials:  None   Subjective:  Seen and examined.  More lethargic today.  Did not respond to verbal cues, he became very uncomfortable with pain on attempted mobility restimulation.  He has not eaten anything since last 24 hours.  Objective: Vitals:   10/18/21 2039 10/19/21 0452 10/19/21 0500 10/19/21 0911  BP: (!) 112/51 (!) 112/54  119/63  Pulse: 76 72  67  Resp: '18 19  18  '$ Temp: 98.2 F (36.8 C) 97.8 F (36.6 C)  98.3 F (36.8 C)  TempSrc:  Oral    SpO2: 94% 95%  98%  Weight:   82.5 kg   Height:        Intake/Output Summary (Last 24 hours) at 10/19/2021 1317 Last data filed at 10/19/2021 1123 Gross per 24 hour  Intake 549.1 ml  Output 400 ml  Net 149.1 ml   Filed Weights   10/17/21 0430 10/18/21 0553 10/19/21 0500  Weight: 80.7 kg 82 kg 82.5 kg    Examination:  General exam: Frail and debilitated.  Sick looking.  Uncomfortable on stimulation. Mostly lethargic and sleepy, minimally interactive on stimulation.  Confused. Respiratory system: Poor bilateral air entry. Cardiovascular system: S1 & S2 heard, RRR.  Anasarca. Right forearm and hand with painful swelling and pitting edema. Gastrointestinal system: Soft.  Nontender.  Bowel sound present.     Data Reviewed: I have personally reviewed following labs and imaging studies  CBC: Recent Labs  Lab 10/14/21 0340 10/15/21 0051 10/16/21 0457 10/17/21 0427  WBC 14.7* 14.0* 13.0* 16.3*  NEUTROABS  --   --  10.3*  --   HGB 9.5* 8.9* 8.9* 9.7*  HCT 28.6* 27.5* 27.4* 30.9*  MCV 81.9 83.3 84.3  85.1  PLT 215 224 226 366   Basic Metabolic Panel: Recent Labs  Lab 10/15/21 0051 10/16/21 0457 10/16/21 1521 10/17/21 0427 10/19/21 0349  NA 152* 153* 151* 149* 141  K 3.5 3.5 3.5 3.6 4.0  CL 123* 125* 122* 116* 111  CO2 20* 21* 20* 20* 18*  GLUCOSE 202* 255* 224* 195* 220*  BUN 72* 78* 80* 78* 106*  CREATININE 4.19* 4.40* 4.83* 5.03* 7.21*  CALCIUM 8.2* 8.1* 8.1* 8.2* 8.0*  PHOS  --  3.7  --  4.4  --    GFR: Estimated Creatinine Clearance: 8.7 mL/min (A) (by C-G formula based on SCr of 7.21 mg/dL (H)). Liver Function Tests: Recent Labs  Lab 10/14/21 0340 10/16/21 0457 10/17/21 0427  AST 145*  --   --   ALT 73*  --   --   ALKPHOS 603*  --   --   BILITOT 1.2  --   --   PROT 5.4*  --   --   ALBUMIN 1.6* <1.5* <1.5*   No results for input(s): "LIPASE", "AMYLASE" in the last 168 hours. No results for input(s): "AMMONIA" in the last 168 hours. Coagulation Profile: No results for input(s): "INR", "PROTIME" in the last 168 hours. Cardiac Enzymes: No results for input(s): "CKTOTAL", "CKMB", "CKMBINDEX", "TROPONINI" in the last 168 hours.  BNP (last 3 results) No results for input(s): "PROBNP" in the last 8760 hours. HbA1C: No results for input(s): "HGBA1C" in the last 72 hours. CBG: Recent Labs  Lab 10/18/21 0717 10/18/21 1133 10/18/21 1653 10/18/21 2104 10/19/21 0719  GLUCAP 211* 359* 305* 232* 219*   Lipid Profile: No results for input(s): "CHOL", "HDL", "LDLCALC", "TRIG", "CHOLHDL", "LDLDIRECT" in the last 72 hours. Thyroid Function Tests: No results for input(s): "TSH", "T4TOTAL", "FREET4", "T3FREE", "THYROIDAB" in the last 72 hours. Anemia Panel: No results for input(s): "VITAMINB12", "FOLATE", "FERRITIN", "TIBC", "IRON", "RETICCTPCT" in the last 72 hours. Sepsis Labs: No results for input(s): "PROCALCITON", "LATICACIDVEN" in the last 168 hours.  No results found for this or any previous visit (from the past 240 hour(s)).        Radiology  Studies: No results found.      Scheduled Meds:  Chlorhexidine Gluconate Cloth  6 each Topical Q0600   dorzolamide-timolol  1 drop Both Eyes BID   feeding supplement  1 Container Oral TID BM   feeding supplement  237 mL Oral TID BM   latanoprost  1 drop Both Eyes QHS   leptospermum manuka honey  1 application  Topical Daily   lidocaine  1 patch Transdermal Q24H   Continuous Infusions:     LOS: 11 days    Time spent: 35 minutes    Barb Merino, MD [facility] Pager [phone]

## 2021-10-20 NOTE — TOC Progression Note (Signed)
Transition of Care [facility]) - Progression Note    Patient Details  Name: [name] MRN: [id number] Date of Birth: [birth year]  Transition of [facility]) CM/SW Contact  [address], [place], [place] Phone Number: 10/20/2021, 11:13 AM  Clinical Narrative:     This social worker notified that patient is Hospice now and is agreeable to [employer]. Hospice liaison [facility] notified and will evaluate.   Alvaro Aungst, LCSW Transition of Care   Expected Discharge Plan: Skilled Nursing Facility Barriers to Discharge: Continued Medical Work up, SNF Pending bed offer, Ship broker  Expected Discharge Plan and Services Expected Discharge Plan: [place] In-house Referral: Clinical Social Work   Post Acute Care Choice: [place] Living arrangements for the past 2 months: Single Family Home                 DME Arranged: N/A DME Agency: NA                   Social Determinants of Health (SDOH) Interventions    Readmission Risk Interventions    10/09/2021    2:37 PM  Readmission Risk Prevention Plan  Transportation Screening Complete  PCP or Specialist Appt within 5-7 Days Complete  Home Care Screening Complete  Medication Review (RN CM) Complete

## 2021-10-20 NOTE — Progress Notes (Signed)
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  Work-up in the emergency department showed rhabdomyolysis, L2 anterior superior corner fracture.  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  Hospital course remarkable for progressive worsening of mental status, declining kidney function.  Patient transferred from [facility] to [facility] for consideration of dialysis.  Nephrology closely following.  Neurosurgery also consulted for persistent back pain.  Remains in poor clinical status, 6/9, converted to comfort care and hospice.  See plan of care below.   Assessment & Plan:   Traumatic L2 vertebral fracture Acute kidney injury on CKD stage IV with rhabdomyolysis and hyperkalemia, uremia Acute metabolic encephalopathy, multifactorial.  Mostly from uremic symptoms. 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  Patient is reaching end-of-life and expected to die soon.  After careful discussion with family, everyone agreed to treat him with comfort care and hospice level of care. Adequate pain medications with IV and oral opiates, manage secretions, manage anxiety. Provide end-of-life care Unrestricted visitor policy for end-of-life RN to pronounce death if happens in the hospital Recommended transfer to inpatient hospice today, however family was hesitant to transfer him due to pain and suffering. Hospital date anticipated.  DVT prophylaxis:   Comfort care   Code Status:  DNR/DNI, comfort care Family Communication: Brother at the bedside. Disposition Plan: Status is: Inpatient Remains inpatient appropriate because: End-of-life care     Consultants:  Palliative Nephrology Neurosurgery  Procedures:  None  Antimicrobials:  None   Subjective:  Patient seen and examined.  He tells me he hurts everywhere.  Remains extremely lethargic and answers few questions.  In the morning rounds, his brother was at the bedside and we discussed about referral to [facility].  However, patient family were hesitant that he will be suffering and hurting on transfer.  We will continue to provide end-of-life care.  Objective: Vitals:   10/19/21 0500 10/19/21 0911 10/20/21 0432 10/20/21 0827  BP:  119/63 (!) 109/54 (!) 105/55  Pulse:  67 69 63  Resp:  18 (!) 32 16  Temp:  98.3 F (36.8 C)  98.8 F (37.1 C)  TempSrc:    Oral  SpO2:  98% 99% 97%  Weight: 82.5 kg     Height:        Intake/Output Summary (Last 24 hours) at 10/20/2021 1355 Last data filed at 10/19/2021 1700 Gross per 24 hour  Intake 0 ml  Output --  Net 0 ml   Filed Weights   10/17/21 0430 10/18/21 0553 10/19/21 0500  Weight: 80.7 kg 82 kg 82.5 kg    Examination:  General: Sick looking, lethargic, medicated for comfort.  Looks uncomfortable on stimulation or conversation. Cardiovascular: S1-S2 normal.  Tachycardic. Respiratory: Looks comfortable.  Breathing on room air. Anasarca.     Data Reviewed: I have personally reviewed following labs and imaging studies  CBC: Recent Labs  Lab 10/14/21 0340 10/15/21 0051 10/16/21 0457 10/17/21 0427  WBC 14.7* 14.0* 13.0* 16.3*  NEUTROABS  --   --  10.3*  --  HGB 9.5* 8.9* 8.9* 9.7*  HCT 28.6* 27.5* 27.4* 30.9*  MCV 81.9 83.3 84.3 85.1  PLT 215 224 226 220   Basic Metabolic Panel: Recent Labs  Lab 10/15/21 0051 10/16/21 0457 10/16/21 1521 10/17/21 0427 10/19/21 0349  NA 152* 153* 151* 149* 141  K 3.5 3.5 3.5 3.6 4.0  CL 123*  125* 122* 116* 111  CO2 20* 21* 20* 20* 18*  GLUCOSE 202* 255* 224* 195* 220*  BUN 72* 78* 80* 78* 106*  CREATININE 4.19* 4.40* 4.83* 5.03* 7.21*  CALCIUM 8.2* 8.1* 8.1* 8.2* 8.0*  PHOS  --  3.7  --  4.4  --    GFR: Estimated Creatinine Clearance: 8.7 mL/min (A) (by C-G formula based on SCr of 7.21 mg/dL (H)). Liver Function Tests: Recent Labs  Lab 10/14/21 0340 10/16/21 0457 10/17/21 0427  AST 145*  --   --   ALT 73*  --   --   ALKPHOS 603*  --   --   BILITOT 1.2  --   --   PROT 5.4*  --   --   ALBUMIN 1.6* <1.5* <1.5*   No results for input(s): "LIPASE", "AMYLASE" in the last 168 hours. No results for input(s): "AMMONIA" in the last 168 hours. Coagulation Profile: No results for input(s): "INR", "PROTIME" in the last 168 hours. Cardiac Enzymes: No results for input(s): "CKTOTAL", "CKMB", "CKMBINDEX", "TROPONINI" in the last 168 hours.  BNP (last 3 results) No results for input(s): "PROBNP" in the last 8760 hours. HbA1C: No results for input(s): "HGBA1C" in the last 72 hours. CBG: Recent Labs  Lab 10/18/21 0717 10/18/21 1133 10/18/21 1653 10/18/21 2104 10/19/21 0719  GLUCAP 211* 359* 305* 232* 219*   Lipid Profile: No results for input(s): "CHOL", "HDL", "LDLCALC", "TRIG", "CHOLHDL", "LDLDIRECT" in the last 72 hours. Thyroid Function Tests: No results for input(s): "TSH", "T4TOTAL", "FREET4", "T3FREE", "THYROIDAB" in the last 72 hours. Anemia Panel: No results for input(s): "VITAMINB12", "FOLATE", "FERRITIN", "TIBC", "IRON", "RETICCTPCT" in the last 72 hours. Sepsis Labs: No results for input(s): "PROCALCITON", "LATICACIDVEN" in the last 168 hours.  No results found for this or any previous visit (from the past 240 hour(s)).        Radiology Studies: No results found.      Scheduled Meds:  Chlorhexidine Gluconate Cloth  6 each Topical Q0600   dorzolamide-timolol  1 drop Both Eyes BID   feeding supplement  1 Container Oral TID BM   feeding  supplement  237 mL Oral TID BM   latanoprost  1 drop Both Eyes QHS   leptospermum manuka honey  1 application  Topical Daily   lidocaine  1 patch Transdermal Q24H   Continuous Infusions:     LOS: 12 days    Time spent: 35 minutes    Barb Merino, MD [facility] Pager [phone]

## 2021-10-20 NOTE — Progress Notes (Addendum)
[facility]) Hospital Liaison Note  Referral received for patient/family interest in [facility]. Chart under review by [facility] physician.   Hospice eligibility confirmed..  Family has declined bed at [facility] and would like to continue to stay in the hospital. MD and primary team aware.   Please call with any questions or concerns. Thank you  Roselee Nova, [place] Hospital Liaison [phone]

## 2021-10-21 NOTE — Progress Notes (Signed)
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  Work-up in the emergency department showed rhabdomyolysis, L2 anterior superior corner fracture.  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  Hospital course remarkable for progressive worsening of mental status, declining kidney function.  Patient transferred from [facility] to [facility] for consideration of dialysis.  Nephrology closely following.  Neurosurgery also consulted for persistent back pain.  Remained in poor clinical status, 6/9, converted to comfort care and hospice.  Remains in the hospital as no inpatient bed available.   Assessment & Plan:   Traumatic L2 vertebral fracture Acute kidney injury on CKD stage IV with rhabdomyolysis and hyperkalemia, uremia Acute metabolic encephalopathy, multifactorial.  Mostly from uremic symptoms. 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 After careful discussion with family, everyone agreed to treat him with comfort care and hospice level of care. Adequate pain medications with IV and oral opiates, manage secretions, manage anxiety. Provide end-of-life care Unrestricted visitor policy for end-of-life RN to pronounce death if happens in the hospital Family agreeable to transfer to [facility], however bed not available today.  DVT prophylaxis:   Comfort care   Code Status: DNR/DNI, comfort care Family Communication: Brother and son at the bedside. Disposition Plan: Status is:  Inpatient Remains inpatient appropriate because: End-of-life care     Consultants:  Palliative Nephrology Neurosurgery  Procedures:  None  Antimicrobials:  None   Subjective:  Patient seen and examined.  He was more awake today and was able to answer basic questions.  Family is giving him sips of water and some juice and he was able to swallow.  Pain is well controlled.  Hand swelling is better. Patient knows he is at [facility], still remains very lethargic.  Objective: Vitals:   10/19/21 0911 10/20/21 0432 10/20/21 0827 10/21/21 0533  BP: 119/63 (!) 109/54 (!) 105/55 (!) 111/27  Pulse: 67 69 63 60  Resp: 18 (!) 32 16 17  Temp: 98.3 F (36.8 C)  98.8 F (37.1 C) 97.6 F (36.4 C)  TempSrc:   Oral Oral  SpO2: 98% 99% 97% 97%  Weight:      Height:        Intake/Output Summary (Last 24 hours) at 10/21/2021 1101 Last data filed at 10/20/2021 1633 Gross per 24 hour  Intake --  Output 225 ml  Net -225 ml   Filed Weights   10/17/21 0430 10/18/21 0553 10/19/21 0500  Weight: 80.7 kg 82 kg 82.5 kg    Examination:  General: Frail debilitated chronically sick looking. Alert and awake.  Oriented x2-3 today.  Comfortable after he is medicated. Cardiovascular: S1-S2 normal.  Regular rate rhythm. Respiratory: Bilateral clear.  Comfortable on room air. Gastrointestinal: Nontender. Ext: Anasarca.     Data Reviewed: I have personally reviewed following labs and imaging studies  CBC: Recent Labs  Lab 10/15/21 0051 10/16/21 0457 10/17/21 0427  WBC 14.0* 13.0* 16.3*  NEUTROABS  --  10.3*  --   HGB 8.9* 8.9* 9.7*  HCT 27.5* 27.4* 30.9*  MCV 83.3 84.3 85.1  PLT 224 226 919   Basic Metabolic Panel: Recent Labs  Lab 10/15/21 0051 10/16/21 0457 10/16/21 1521 10/17/21 0427 10/19/21 0349  NA 152* 153* 151* 149* 141  K 3.5 3.5 3.5 3.6 4.0  CL 123* 125* 122* 116* 111  CO2 20* 21* 20* 20* 18*  GLUCOSE 202* 255* 224* 195* 220*  BUN 72* 78* 80* 78* 106*   CREATININE 4.19* 4.40* 4.83* 5.03* 7.21*  CALCIUM 8.2* 8.1* 8.1* 8.2* 8.0*  PHOS  --  3.7  --  4.4  --    GFR: Estimated Creatinine Clearance: 8.7 mL/min (A) (by C-G formula based on SCr of 7.21 mg/dL (H)). Liver Function Tests: Recent Labs  Lab 10/16/21 0457 10/17/21 0427  ALBUMIN <1.5* <1.5*   No results for input(s): "LIPASE", "AMYLASE" in the last 168 hours. No results for input(s): "AMMONIA" in the last 168 hours. Coagulation Profile: No results for input(s): "INR", "PROTIME" in the last 168 hours. Cardiac Enzymes: No results for input(s): "CKTOTAL", "CKMB", "CKMBINDEX", "TROPONINI" in the last 168 hours.  BNP (last 3 results) No results for input(s): "PROBNP" in the last 8760 hours. HbA1C: No results for input(s): "HGBA1C" in the last 72 hours. CBG: Recent Labs  Lab 10/18/21 0717 10/18/21 1133 10/18/21 1653 10/18/21 2104 10/19/21 0719  GLUCAP 211* 359* 305* 232* 219*   Lipid Profile: No results for input(s): "CHOL", "HDL", "LDLCALC", "TRIG", "CHOLHDL", "LDLDIRECT" in the last 72 hours. Thyroid Function Tests: No results for input(s): "TSH", "T4TOTAL", "FREET4", "T3FREE", "THYROIDAB" in the last 72 hours. Anemia Panel: No results for input(s): "VITAMINB12", "FOLATE", "FERRITIN", "TIBC", "IRON", "RETICCTPCT" in the last 72 hours. Sepsis Labs: No results for input(s): "PROCALCITON", "LATICACIDVEN" in the last 168 hours.  No results found for this or any previous visit (from the past 240 hour(s)).        Radiology Studies: No results found.      Scheduled Meds:  dorzolamide-timolol  1 drop Both Eyes BID   feeding supplement  1 Container Oral TID BM   feeding supplement  237 mL Oral TID BM   latanoprost  1 drop Both Eyes QHS   leptospermum manuka honey  1 application  Topical Daily   lidocaine  1 patch Transdermal Q24H   Continuous Infusions:     LOS: 13 days    Time spent: 35 minutes    Barb Merino, MD [facility] Pager  [phone]

## 2021-10-21 NOTE — Progress Notes (Signed)
[facility]) Hospital Liaison Note   Unfortunately, [facility] is unable to offer a room today. Hospital Liaison will follow up tomorrow or sooner if a room becomes available.    Please call with any questions or concerns. Thank you   Daphene Calamity, MSW [facility] Liaison [phone]

## 2021-10-21 NOTE — Plan of Care (Signed)
  Problem: Clinical Measurements: Goal: Ability to maintain clinical measurements within normal limits will improve Outcome: Progressing Goal: Will remain free from infection Outcome: Progressing Goal: Diagnostic test results will improve Outcome: Progressing Goal: Respiratory complications will improve Outcome: Progressing Goal: Cardiovascular complication will be avoided Outcome: Progressing   Problem: Activity: Goal: Risk for activity intolerance will decrease Outcome: Progressing   Problem: Nutrition: Goal: Adequate nutrition will be maintained Outcome: Progressing   Problem: Elimination: Goal: Will not experience complications related to bowel motility Outcome: Progressing Goal: Will not experience complications related to urinary retention Outcome: Progressing   Problem: Pain Managment: Goal: General experience of comfort will improve Outcome: Progressing   Problem: Safety: Goal: Ability to remain free from injury will improve Outcome: Progressing   Problem: Skin Integrity: Goal: Risk for impaired skin integrity will decrease Outcome: Progressing   Problem: Education: Goal: Knowledge of General Education information will improve Description: Including pain rating scale, medication(s)/side effects and non-pharmacologic comfort measures Outcome: Progressing   Problem: Health Behavior/Discharge Planning: Goal: Ability to manage health-related needs will improve Outcome: Progressing   Problem: Clinical Measurements: Goal: Ability to maintain clinical measurements within normal limits will improve Outcome: Progressing Goal: Will remain free from infection Outcome: Progressing Goal: Diagnostic test results will improve Outcome: Progressing Goal: Respiratory complications will improve Outcome: Progressing Goal: Cardiovascular complication will be avoided Outcome: Progressing   Problem: Activity: Goal: Risk for activity intolerance will decrease Outcome:  Progressing   Problem: Nutrition: Goal: Adequate nutrition will be maintained Outcome: Progressing   Problem: Coping: Goal: Level of anxiety will decrease Outcome: Progressing   Problem: Elimination: Goal: Will not experience complications related to bowel motility Outcome: Progressing Goal: Will not experience complications related to urinary retention Outcome: Progressing   Problem: Pain Managment: Goal: General experience of comfort will improve Outcome: Progressing   Problem: Safety: Goal: Ability to remain free from injury will improve Outcome: Progressing   Problem: Skin Integrity: Goal: Risk for impaired skin integrity will decrease Outcome: Progressing   Problem: Education: Goal: Ability to describe self-care measures that may prevent or decrease complications (Diabetes Survival Skills Education) will improve Outcome: Progressing Goal: Individualized Educational Video(s) Outcome: Progressing   Problem: Coping: Goal: Ability to adjust to condition or change in health will improve Outcome: Progressing   Problem: Fluid Volume: Goal: Ability to maintain a balanced intake and output will improve Outcome: Progressing   Problem: Health Behavior/Discharge Planning: Goal: Ability to identify and utilize available resources and services will improve Outcome: Progressing Goal: Ability to manage health-related needs will improve Outcome: Progressing   Problem: Metabolic: Goal: Ability to maintain appropriate glucose levels will improve Outcome: Progressing   Problem: Nutritional: Goal: Maintenance of adequate nutrition will improve Outcome: Progressing Goal: Progress toward achieving an optimal weight will improve Outcome: Progressing   Problem: Skin Integrity: Goal: Risk for impaired skin integrity will decrease Outcome: Progressing   Problem: Tissue Perfusion: Goal: Adequacy of tissue perfusion will improve Outcome: Progressing

## 2021-10-21 NOTE — Progress Notes (Signed)
Patient ID: ORYN CASANOVA, male   DOB: 05/06/1941, 81 y.o.   MRN: [id number]    Progress Note from the Palliative Medicine Team at [facility]   Patient Name: RIGGIN CUTTINO        Date: 10/21/2021 DOB: 20-Jan-1941  Age: 81 y.o. MRN#: [id number] Attending Physician: Barb Merino, MD Primary Care Physician: Burnard Bunting, MD Admit Date: 10/08/2021   Medical records reviewed    81 y.o. 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  Work-up in the emergency department showed rhabdomyolysis, L2 anterior superior fracture.  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  Hospital course remarkable for progressive worsening of mental status, declining kidney function.  Patient transferred from [facility] to [facility] for consideration of dialysis.  Nephrology following.  Continued physical, functional and cognitive decline and spite of full medical support.   Today patient is lethargic and difficult to arouse.  This NP visited patient at the bedside as a follow up for palliative medicine needs and emotional support and to meet as scheduled with patient's wife/[name] [place], daughters/ Foy [place] and Maudie [place], son [place]  and his brother Laverna Peace for continued conversation regarding current medical situation;    treatment option decisions, advanced directive decisions and anticipatory care needs.   According to medical records and discussion with Dr. Sloan Leiter,  family made decision to shift to full comfort path yesterday.   Education offered on the natural trajectory and expectations at end-of-life  Plan of care   Focus of care is comfort and dignity -DNR/DNI -No artificial feeding or hydration now or in the future -No further diagnostic,  labs, scans -Symptom management for comfort  measures -Prognosis is days to a week - Discussion had regarding residential hospice facility   Family plan today to visit area hospice, then want to ensure that facility can provide is in alignment with Mr Dafoe needs    Education on hospice benefit; philosophy and eligibility.   Detailed residential hospice benefit and associated prognosis of less than 2 weeks.  Questions and concerns addressed      Wadie Lessen NP  Palliative Medicine Team Team Phone # [phone] Pager [phone]

## 2021-10-22 MED ORDER — FENTANYL CITRATE PF 50 MCG/ML IJ SOSY
50.0000 ug | PREFILLED_SYRINGE | INTRAMUSCULAR | Status: DC | PRN
  Administered 2021-10-22 (×2): 50 ug via INTRAVENOUS
  Filled 2021-10-22 (×2): qty 1

## 2021-10-22 NOTE — Progress Notes (Signed)
Patient ID: AKEEN LEDYARD, male   DOB: 1940/08/10, 81 y.o.   MRN: [id number]    Progress Note from the Palliative Medicine Team at [facility]   Patient Name: [name]        Date: 10/22/2021 DOB: 10/28/1940  Age: 81 y.o. MRN#: [id number] Attending Physician: Barb Merino, MD Primary Care Physician: Burnard Bunting, MD Admit Date: 10/08/2021   Medical records reviewed    81 y.o. 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  Work-up in the emergency department showed rhabdomyolysis, L2 anterior superior fracture.  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  Hospital course remarkable for progressive worsening of mental status, declining kidney function.  Patient transferred from [facility] to [facility] for consideration of dialysis.  Nephrology following.  Continued physical, functional and cognitive decline and spite of full medical support.   Today patient is lethargic and difficult to arouse and continues to decline.  Prognosis is likely days to week.   Continue conversation with family regarding plan of care, hope is for transition to residential hospice for end-of-life care.  Education offered on hospice benefit specific to inpatient facility.  Education offered on the difference between an aggressive medical intervention path and a palliative comfort path for Mr. Elisabeth Cara.  Education offered on the natural trajectory and expectations at end-of-life.  addressed.  Family plan today to visit area hospice, then want to ensure that facility can provide is in alignment with Mr Ander needs   Questions and concerns addressed     Discussed with treatment team.   Wadie Lessen NP  Palliative Medicine Team Team Phone # [phone] Pager [phone]

## 2021-10-22 NOTE — TOC Progression Note (Addendum)
Transition of Care [facility]) - Progression Note    Patient Details  Name: [name] MRN: [id number] Date of Birth: Nov 25, 1940  Transition of Care [facility]) CM/SW Contact  Jacalyn Lefevre Edson Snowball, RN Phone Number: 10/22/2021, 11:34 AM  Clinical Narrative:     Received message from palliative care team , family interested in [place].   Spoke to patient's son . Confirmed family does want to speak to someone from [place] . His sister toured their hospice home this morning. Family deciding on taking [place] home with hospice vs a hospice home. Patient's wife [name] [phone] is the contact person.   NCM spoke to [facility] with admissions at [facility] and explained above. Debbie requested referral be emailed to them at [email or web address]. They will review referral and then call [facility] .   [place] with [facility] called their Market researcher is reviewing referral   [facility] called and offered a bed. [facility] , patient's brother and son all accepted. They understand [facility] will transport , NCM does not have a time [facility] will arrive. Called [facility] and was told he will be a priority because he is hospice. Nurse has number to call report . MD working on discharge summary . [facility] and DNR forms in chart .Debbie aware , and discharge summary emailed to her   Expected Discharge Plan:  (home with hospice vs hospice home) Barriers to Discharge: Continued Medical Work up, SNF Pending bed offer, Ship broker  Expected Discharge Plan and Services Expected Discharge Plan:  (home with hospice vs hospice home) In-house Referral: Clinical Social Work   Post Acute Care Choice: [place] Living arrangements for the past 2 months: Single Family Home                 DME Arranged: N/A DME Agency: NA                   Social Determinants of Health (SDOH) Interventions    Readmission Risk Interventions    10/09/2021    2:37 PM   Readmission Risk Prevention Plan  Transportation Screening Complete  PCP or Specialist Appt within 5-7 Days Complete  Home Care Screening Complete  Medication Review (RN CM) Complete

## 2021-10-22 NOTE — Consult Note (Signed)
[place] Nurse wound follow up Patient receiving care in [place]. Palliative care NP M. Davis Gourd was present in the room at the time of my visit, and explained the patient is now full comfort measures.  I am signing the [place] team off.  [place] nurse will not follow at this time.  Please re-consult the [place] team if needed.  Val Riles, RN, MSN, CWOCN, CNS-BC, pager [phone]

## 2021-10-22 NOTE — Discharge Summary (Signed)
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  Work-up in the emergency department showed rhabdomyolysis, L2 anterior superior corner fracture.  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  Hospital course remarkable for progressive worsening of mental status, declining kidney function.  Patient transferred from [facility] to [facility] for consideration of dialysis.  Nephrology closely following.  Neurosurgery also consulted for persistent back pain.  Remained in poor clinical status, 6/9, converted to comfort care and hospice.  Remained in the hospital providing end-of-life care.      Assessment & Plan:   Traumatic L2 vertebral fracture Acute kidney injury on CKD stage IV with rhabdomyolysis and hyperkalemia, uremia Acute metabolic encephalopathy, multifactorial.  Mostly from uremic symptoms. 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 Started on comfort care measures and provided end of life. Manage with IV and oral opiates, manage secretions, manage anxiety. Provide end-of-life care Transfer to hospice today. Will be medicated before transferring. Stable for transfer.    Discharge  Diagnoses:  Principal Problem:   AKI (acute kidney injury) ([place]) Active Problems:   Coronary artery disease of native artery of native heart with stable angina pectoris (HCC)   MGUS (monoclonal gammopathy of unknown significance)   Primary hypertension   CKD (chronic kidney disease) stage 4, GFR 15-29 ml/min (HCC)   HLD (hyperlipidemia)   Type 2 diabetes mellitus with hyperlipidemia ([place])   Fall at home, initial encounter   Closed fracture of lumbar spine without lesion of spinal cord (HCC)   GERD (gastroesophageal reflux disease)   Elevated LFTs   Rhabdomyolysis   Pressure injury of skin   Normal anion gap metabolic acidosis   End of life care    Discharge Instructions  Discharge Instructions     Diet general   Complete by: As directed    Comfort diet   Discharge wound care:   Complete by: As directed    For comfort      Allergies as of 10/22/2021       Reactions   Sulfa Antibiotics Hives   Morphine And Related Other (See Comments)   Spontaneous body movements   Lisinopril Cough        Medication List     STOP taking these medications    amLODipine 2.5 MG tablet Commonly known as: NORVASC   aspirin EC 81 MG tablet   Bystolic 20 MG Tabs Generic drug: Nebivolol HCl   dorzolamide-timolol 22.3-6.8 MG/ML ophthalmic solution Commonly known as: COSOPT   isosorbide mononitrate 60 MG 24 hr tablet Commonly known as: IMDUR   Kerendia 10 MG Tabs Generic drug: Finerenone   latanoprost 0.005 % ophthalmic solution Commonly known as: XALATAN   nitroGLYCERIN 0.4 MG SL tablet Commonly known as: NITROSTAT   pantoprazole 40 MG tablet Commonly known as: PROTONIX   rosuvastatin  20 MG tablet Commonly known as: CRESTOR   Tresiba FlexTouch 100 UNIT/ML FlexTouch Pen Generic drug: insulin degludec               Discharge Care Instructions  (From admission, onward)           Start     Ordered   10/22/21 0000  Discharge wound care:       Comments:  For comfort   10/22/21 1337            Contact information for after-discharge care     Destination     [facility] .   Service: Skilled Nursing Contact information: [address] [address] [phone]                    Allergies  Allergen Reactions   Sulfa Antibiotics Hives   Morphine And Related Other (See Comments)    Spontaneous body movements   Lisinopril Cough    Consultations: Neurosurgery Nephrology Palliative care    Procedures/Studies: MR LUMBAR SPINE WO CONTRAST  Result Date: 10/15/2021 CLINICAL DATA:  81 year old male with recent fall. Back pain. L2 and L4 anterior superior endplate/osteophyte fractures in the setting of flowing endplate osteophytes and ankylosis on lumbar spine CT last month. Pain. Weakness. Large liver mass. EXAM: MRI LUMBAR SPINE WITHOUT CONTRAST TECHNIQUE: Multiplanar, multisequence MR imaging of the lumbar spine was performed. No intravenous contrast was administered. COMPARISON:  CT lumbar spine 10/08/2021. Abdomen MRI 10/09/2021. FINDINGS: Segmentation:  Normal on the comparison. Alignment: Lumbar lordosis is stable and relatively preserved. No significant spondylolisthesis. Vertebrae: Fairly subtle marrow edema at the anterior superior L2 endplate associated with the recent CT finding. At L4, the dominant finding is a large tear of the L3-L4 disc, oblique on series 16, image 7. This tracks toward the L4 osteophyte fractures seen recently by CT. There is also a nondisplaced fracture of the tip of the L4 spinous process evident on series 15, image 8. No other marrow edema or acute osseous abnormality identified. Intact visible sacrum and SI joints. Normal background bone marrow signal. Conus medullaris and cauda equina: Conus extends to the T12-L1 level. No lower spinal cord or conus signal abnormality. Normal cauda equina nerve roots, with capacious spinal canal at most levels. Paraspinal  and other soft tissues: Absent right kidney again noted. Large right liver mass partially visible, see abdomen MRI 10/09/2021. Disc levels: Visible lower thoracic levels through L2-L3 are negative for age, with underlying interbody ankylosis via flowing endplate osteophytes (traumatically interrupted at L1-L2 as seen on 10/08/2021. L3-L4: Long oblique tear of the disc tracking to the fractured anterior superior L4 osteophyte (series 15, image 7). Left far lateral broad-based disc bulge/protrusion (series 16, image 13). But no associated spinal stenosis. There is mild to moderate left L3 foraminal stenosis. L4-L5: Circumferential disc bulge with endplate spurring. Prominent left far lateral component here also. Mild to moderate facet and ligament flavum hypertrophy. Trace degenerative facet joint fluid. Mild spinal stenosis. Mild to moderate bilateral lateral recess stenosis (L5 nerve levels). Moderate to severe bilateral L4 foraminal stenosis. L5-S1: Negative disc.  Mild endplate spurring.  No stenosis. IMPRESSION: 1. Underlying lower thoracic and upper lumbar interbody ankylosis from flowing endplate osteophytes. Acute fractures of the anterior superior endplates/osteophytes at both L2 and L4 are better demonstrated on the recent CT. But this exam demonstrates a long tear of the L3-L4 disc, and a nondisplaced fracture of the L4 spinous process, in association with the  L4 injury. No subsequent spinal stenosis at that level but bulky leftward/lateral disc herniation is associated with up to moderate left L3 neural foraminal stenosis. 2. Disc and posterior element degeneration at L4-L5 with mild spinal stenosis, mild to moderate bilateral lateral recess stenosis, and moderate to severe bilateral L4 foraminal stenosis. 3. Large right Liver mass, see Abdomen MRI 10/09/2021. Electronically Signed   By: Genevie Ann M.D.   On: 10/15/2021 09:46   DG Lumbar Spine 2-3 Views  Result Date: 10/14/2021 CLINICAL DATA:  Fall,  weakness EXAM: LUMBAR SPINE - 2-3 VIEW COMPARISON:  CT 10/08/2021 FINDINGS: Five lumbar type vertebral segments. Vertebral body heights are maintained. Previously described fractured osteophytes at L2 and L4, better seen on recent CT. No change in alignment. Disc heights are preserved. Bones are demineralized. Aortic atherosclerosis. IMPRESSION: Previously described fractured osteophytes at L2 and L4, better seen on recent CT. No change in alignment. No new findings. Electronically Signed   By: Davina Poke D.O.   On: 10/14/2021 16:17   MR LIVER WO CONRTAST  Result Date: 10/09/2021 CLINICAL DATA:  Liver mass on ultrasound. 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 Acute renal insufficiency. Dementia. EXAM: MRI ABDOMEN WITHOUT CONTRAST TECHNIQUE: Multiplanar multisequence MR imaging was performed without the administration of intravenous contrast. COMPARISON:  Ultrasound 10/08/2021. Abdominal CT of 11/29/2018. PET of 02/19/2018. FINDINGS: Exam is mildly degraded by lack of IV contrast and mild motion. Lower chest: Small right pleural effusion.  Mild cardiomegaly. Hepatobiliary: No evidence of cirrhosis. Dominant right hepatic lobe mass measures 10.5 x 12.5 x 12.7 cm on 21/5 and 21/3. Demonstrates heterogeneous primarily hyperintense T2 signal. Areas of precontrast T1 hyperintensity throughout including on series 12. Normal gallbladder, without biliary ductal dilatation. Pancreas:  Normal, without mass or ductal dilatation. Spleen:  Normal in size, without focal abnormality. Adrenals/Urinary Tract: Normal right adrenal gland. Left renal cortical thinning. Right nephrectomy, without local recurrence. Stomach/Bowel: Normal stomach and abdominal bowel loops. Vascular/Lymphatic: Normal caliber of the aorta and branch vessels. No retroperitoneal or retrocrural adenopathy. Other:  No ascites. Musculoskeletal: No acute osseous abnormality. IMPRESSION: 1. Dominant right hepatic lobe mass is  new since the CT of 11/29/2018. 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 Renal cell carcinoma metastasis or primary malignancy such as hepatocellular carcinoma felt less likely. 2. Decreased sensitivity and specificity exam due to technique related factors, as described above. 3. Right nephrectomy, without local recurrence. 4. Small right pleural effusion. Electronically Signed   By: Abigail Miyamoto M.D.   On: 10/09/2021 12:34   US Abdomen Complete  Result Date: 10/08/2021 CLINICAL DATA:  Patient with previous history of melanoma, and right nephrectomy for carcinoma. Elevated LFTs with AKI. Nearly 10 cm liver mass noted on renal ultrasound 09/06/2021. EXAM: ABDOMEN ULTRASOUND COMPLETE COMPARISON:  Renal ultrasound 09/06/2021 FINDINGS: Gallbladder: No gallstones or wall thickening visualized. No sonographic Murphy sign noted by sonographer. Common bile duct: Diameter: Nonvisualized. Liver: There is heterogeneous solid mass in the right lobe, today measuring 11.6 x 11.1 x 9.2 cm, previously 9.9 x 9.0 x 9.3 cm. Otherwise within normal limits in parenchymal echogenicity. Portal vein is patent on color Doppler imaging with normal direction of blood flow towards the liver. IVC: No abnormality visualized. Pancreas: Not seen, obscured by bowel gas. Spleen: Size and appearance within normal limits. Length measurement is 10.8 cm. Right Kidney: Length: Surgically absent. Left Kidney: Length: 10.4 cm. Echogenicity within normal limits. No mass or hydronephrosis visualized. Abdominal aorta: No  aneurysm visualized. Other findings: No free fluid is seen. Technically difficult due to: Patient inability to assist with positioning or take deep breaths, inability to hold his breath, and also suboptimal image quality due to multifocal bowel gas shadowing and patient body habitus. IMPRESSION: 1. Surgically absent right kidney with sonographically unremarkable left kidney,  as far as seen. Image quality limited. 2. Enlarging mass in the right lobe of the liver, now nearly 12 cm in size most likely representing a primary or metastatic neoplasm. 3. Nonvisualization of the extrahepatic bile ducts and pancreas. Electronically Signed   By: Telford Nab M.D.   On: 10/08/2021 21:15   DG Wrist Complete Right  Result Date: 10/08/2021 CLINICAL DATA:  Status post fall EXAM: RIGHT WRIST - COMPLETE 3+ VIEW COMPARISON:  None available FINDINGS: Extensive chondrocalcinosis of the triangular fibrocartilage. No fracture or dislocation. Moderate degenerative changes of the interphalangeal joint of the thumb. Mild degenerative changes at the first carpometacarpal joint. IMPRESSION: No acute abnormality of the right wrist. Electronically Signed   By: Miachel Roux M.D.   On: 10/08/2021 11:58   DG Pelvis 1-2 Views  Result Date: 10/08/2021 CLINICAL DATA:  Status post fall EXAM: PELVIS - 1-2 VIEW COMPARISON:  Renal stone protocol CT 10/30/2018 FINDINGS: Mild degenerative changes of the hips. Radiation seeds noted in the region the prostate. Soft tissues otherwise unremarkable. IMPRESSION: No acute fracture or dislocation of the pelvis. Electronically Signed   By: Miachel Roux M.D.   On: 10/08/2021 11:57   DG Chest Portable 1 View  Result Date: 10/08/2021 CLINICAL DATA:  Status post fall EXAM: PORTABLE CHEST - 1 VIEW COMPARISON:  07/26/2016 FINDINGS: Apparent cardiomegaly most likely related to extra tori phase of imaging and portable technique. No pulmonary vascular congestion.  Lungs clear.  No pneumothorax. IMPRESSION: No acute cardiopulmonary process. Electronically Signed   By: Miachel Roux M.D.   On: 10/08/2021 11:55   CT Thoracic Spine Wo Contrast  Result Date: 10/08/2021 CLINICAL DATA:  Fall in bathroom with low back pain EXAM: CT THORACIC AND LUMBAR SPINE WITHOUT CONTRAST TECHNIQUE: Multidetector CT imaging of the thoracic and lumbar spine was performed without contrast. Multiplanar  CT image reconstructions were also generated. RADIATION DOSE REDUCTION: This exam was performed according to the departmental dose-optimization program which includes automated exposure control, adjustment of the mA and/or kV according to patient size and/or use of iterative reconstruction technique. COMPARISON:  None Available. FINDINGS: CT THORACIC SPINE FINDINGS Alignment: Exaggerated thoracic kyphosis.  No traumatic malalignment Vertebrae: Bridging osteophytes span T2 to the lumbar spine. No superimposed fracture is seen. Generalized osteopenia. Paraspinal and other soft tissues: No paraspinal hematoma or masslike finding. Disc levels: No visible herniation or impingement CT LUMBAR SPINE FINDINGS Segmentation: 5 lumbar type vertebrae Alignment: Normal Vertebrae: Rigid spine from T2-L2 with fracture through the anterior superior corner of L2 also traversing an anterior intervertebral osteophyte at L1-2. No displacement or visible posterior element involvement at this level. Additional fractures through anterior superior L4 endplate osteophyte. Paraspinal and other soft tissues: Mild paravertebral at edema at the level of fractures. Disc levels: Generalized disc bulging without focal high-grade stenosis is seen. IMPRESSION: 1. L2 anterior superior corner fracture also involving a L1-2 bridging osteophyte. No listhesis or visible posterior element involvement. 2. L4 anterior superior osteophyte fracture. 3. Rigid spine from bridging osteophytes at T2-L2. Electronically Signed   By: Jorje Guild M.D.   On: 10/08/2021 11:52   CT Lumbar Spine Wo Contrast  Result Date: 10/08/2021 CLINICAL DATA:  Fall in bathroom with low back pain EXAM: CT THORACIC AND LUMBAR SPINE WITHOUT CONTRAST TECHNIQUE: Multidetector CT imaging of the thoracic and lumbar spine was performed without contrast. Multiplanar CT image reconstructions were also generated. RADIATION DOSE REDUCTION: This exam was performed according to the  departmental dose-optimization program which includes automated exposure control, adjustment of the mA and/or kV according to patient size and/or use of iterative reconstruction technique. COMPARISON:  None Available. FINDINGS: CT THORACIC SPINE FINDINGS Alignment: Exaggerated thoracic kyphosis.  No traumatic malalignment Vertebrae: Bridging osteophytes span T2 to the lumbar spine. No superimposed fracture is seen. Generalized osteopenia. Paraspinal and other soft tissues: No paraspinal hematoma or masslike finding. Disc levels: No visible herniation or impingement CT LUMBAR SPINE FINDINGS Segmentation: 5 lumbar type vertebrae Alignment: Normal Vertebrae: Rigid spine from T2-L2 with fracture through the anterior superior corner of L2 also traversing an anterior intervertebral osteophyte at L1-2. No displacement or visible posterior element involvement at this level. Additional fractures through anterior superior L4 endplate osteophyte. Paraspinal and other soft tissues: Mild paravertebral at edema at the level of fractures. Disc levels: Generalized disc bulging without focal high-grade stenosis is seen. IMPRESSION: 1. L2 anterior superior corner fracture also involving a L1-2 bridging osteophyte. No listhesis or visible posterior element involvement. 2. L4 anterior superior osteophyte fracture. 3. Rigid spine from bridging osteophytes at T2-L2. Electronically Signed   By: Jorje Guild M.D.   On: 10/08/2021 11:52   CT Head Wo Contrast  Result Date: 10/08/2021 CLINICAL DATA:  [place] in bathroom this morning. Low back pain. Patient reportedly did not hit his head. EXAM: CT HEAD WITHOUT CONTRAST CT CERVICAL SPINE WITHOUT CONTRAST TECHNIQUE: Multidetector CT imaging of the head and cervical spine was performed following the standard protocol without intravenous contrast. Multiplanar CT image reconstructions of the cervical spine were also generated. RADIATION DOSE REDUCTION: This exam was performed according to the  departmental dose-optimization program which includes automated exposure control, adjustment of the mA and/or kV according to patient size and/or use of iterative reconstruction technique. COMPARISON:  08/13/2021. FINDINGS: CT HEAD FINDINGS Brain: No evidence of acute infarction, hemorrhage, hydrocephalus, extra-axial collection or mass lesion/mass effect. Vascular: No hyperdense vessel or unexpected calcification. Skull: Normal. Negative for fracture or focal lesion. Sinuses/Orbits: Globes and orbits are unremarkable. Sinuses are clear. Other: None. CT CERVICAL SPINE FINDINGS Alignment: Normal. Skull base and vertebrae: No acute fracture. No primary bone lesion or focal pathologic process. Soft tissues and spinal canal: No prevertebral fluid or swelling. No visible canal hematoma. Disc levels: Minor loss of disc height at C5-C6. Moderate loss of disc height at C6-C7. Endplate spurring and mild disc bulging noted at C6-C7. Moderate left neural foraminal narrowing. No convincing disc herniation. Upper chest: No acute or significant abnormality. Other: None. IMPRESSION: HEAD CT 1. No acute intracranial abnormalities. CERVICAL CT 1. No fracture or acute finding. Electronically Signed   By: Lajean Manes M.D.   On: 10/08/2021 11:50   CT Cervical Spine Wo Contrast  Result Date: 10/08/2021 CLINICAL DATA:  [place] in bathroom this morning. Low back pain. Patient reportedly did not hit his head. EXAM: CT HEAD WITHOUT CONTRAST CT CERVICAL SPINE WITHOUT CONTRAST TECHNIQUE: Multidetector CT imaging of the head and cervical spine was performed following the standard protocol without intravenous contrast. Multiplanar CT image reconstructions of the cervical spine were also generated. RADIATION DOSE REDUCTION: This exam was performed according to the departmental dose-optimization program which includes automated exposure control, adjustment of the mA and/or kV according to patient  size and/or use of iterative reconstruction  technique. COMPARISON:  08/13/2021. FINDINGS: CT HEAD FINDINGS Brain: No evidence of acute infarction, hemorrhage, hydrocephalus, extra-axial collection or mass lesion/mass effect. Vascular: No hyperdense vessel or unexpected calcification. Skull: Normal. Negative for fracture or focal lesion. Sinuses/Orbits: Globes and orbits are unremarkable. Sinuses are clear. Other: None. CT CERVICAL SPINE FINDINGS Alignment: Normal. Skull base and vertebrae: No acute fracture. No primary bone lesion or focal pathologic process. Soft tissues and spinal canal: No prevertebral fluid or swelling. No visible canal hematoma. Disc levels: Minor loss of disc height at C5-C6. Moderate loss of disc height at C6-C7. Endplate spurring and mild disc bulging noted at C6-C7. Moderate left neural foraminal narrowing. No convincing disc herniation. Upper chest: No acute or significant abnormality. Other: None. IMPRESSION: HEAD CT 1. No acute intracranial abnormalities. CERVICAL CT 1. No fracture or acute finding. Electronically Signed   By: Lajean Manes M.D.   On: 10/08/2021 11:50   (Echo, Carotid, EGD, Colonoscopy, ERCP)    Subjective: lethargic but awake    Discharge Exam: Vitals:   10/21/21 0533 10/22/21 0444  BP: (!) 111/27 (!) 118/57  Pulse: 60 60  Resp: 17 18  Temp: 97.6 F (36.4 C) 97.6 F (36.4 C)  SpO2: 97% 99%   Vitals:   10/20/21 0432 10/20/21 0827 10/21/21 0533 10/22/21 0444  BP: (!) 109/54 (!) 105/55 (!) 111/27 (!) 118/57  Pulse: 69 63 60 60  Resp: (!) 32 '16 17 18  '$ Temp:  98.8 F (37.1 C) 97.6 F (36.4 C) 97.6 F (36.4 C)  TempSrc:  Oral Oral Axillary  SpO2: 99% 97% 97% 99%  Weight:      Height:       Very debilitated and sick looking.  Medicated for comfort.  Anasarca.  Lethargic.     The results of significant diagnostics from this hospitalization (including imaging, microbiology, ancillary and laboratory) are listed below for reference.     Microbiology: No results found for this or any  previous visit (from the past 240 hour(s)).   Labs: BNP (last 3 results) No results for input(s): "BNP" in the last 8760 hours. Basic Metabolic Panel: Recent Labs  Lab 10/16/21 0457 10/16/21 1521 10/17/21 0427 10/19/21 0349  NA 153* 151* 149* 141  K 3.5 3.5 3.6 4.0  CL 125* 122* 116* 111  CO2 21* 20* 20* 18*  GLUCOSE 255* 224* 195* 220*  BUN 78* 80* 78* 106*  CREATININE 4.40* 4.83* 5.03* 7.21*  CALCIUM 8.1* 8.1* 8.2* 8.0*  PHOS 3.7  --  4.4  --    Liver Function Tests: Recent Labs  Lab 10/16/21 0457 10/17/21 0427  ALBUMIN <1.5* <1.5*   No results for input(s): "LIPASE", "AMYLASE" in the last 168 hours. No results for input(s): "AMMONIA" in the last 168 hours. CBC: Recent Labs  Lab 10/16/21 0457 10/17/21 0427  WBC 13.0* 16.3*  NEUTROABS 10.3*  --   HGB 8.9* 9.7*  HCT 27.4* 30.9*  MCV 84.3 85.1  PLT 226 213   Cardiac Enzymes: No results for input(s): "CKTOTAL", "CKMB", "CKMBINDEX", "TROPONINI" in the last 168 hours. BNP: Invalid input(s): "POCBNP" CBG: Recent Labs  Lab 10/18/21 0717 10/18/21 1133 10/18/21 1653 10/18/21 2104 10/19/21 0719  GLUCAP 211* 359* 305* 232* 219*   D-Dimer No results for input(s): "DDIMER" in the last 72 hours. Hgb A1c No results for input(s): "HGBA1C" in the last 72 hours. Lipid Profile No results for input(s): "CHOL", "HDL", "LDLCALC", "TRIG", "CHOLHDL", "LDLDIRECT" in the last 72 hours.  Thyroid function studies No results for input(s): "TSH", "T4TOTAL", "T3FREE", "THYROIDAB" in the last 72 hours.  Invalid input(s): "FREET3" Anemia work up No results for input(s): "VITAMINB12", "FOLATE", "FERRITIN", "TIBC", "IRON", "RETICCTPCT" in the last 72 hours. Urinalysis    Component Value Date/Time   COLORURINE YELLOW 10/09/2021 0209   APPEARANCEUR CLEAR 10/09/2021 0209   LABSPEC 1.025 10/09/2021 0209   PHURINE 6.0 10/09/2021 0209   GLUCOSEU NEGATIVE 10/09/2021 0209   HGBUR LARGE (A) 10/09/2021 0209   BILIRUBINUR NEGATIVE  10/09/2021 0209   KETONESUR NEGATIVE 10/09/2021 0209   PROTEINUR 100 (A) 10/09/2021 0209   NITRITE NEGATIVE 10/09/2021 0209   LEUKOCYTESUR NEGATIVE 10/09/2021 0209   Sepsis Labs Recent Labs  Lab 10/16/21 0457 10/17/21 0427  WBC 13.0* 16.3*   Microbiology No results found for this or any previous visit (from the past 240 hour(s)).   Time coordinating discharge:  35 minutes  SIGNED:   Barb Merino, MD  [facility] 10/22/2021, 4:42 PM

## 2021-10-22 NOTE — Progress Notes (Signed)
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  Work-up in the emergency department showed rhabdomyolysis, L2 anterior superior corner fracture.  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  Hospital course remarkable for progressive worsening of mental status, declining kidney function.  Patient transferred from [facility] to [facility] for consideration of dialysis.  Nephrology closely following.  Neurosurgery also consulted for persistent back pain.  Remained in poor clinical status, 6/9, converted to comfort care and hospice.  Remains in the hospital providing end-of-life care. Waiting for inpatient hospice availability.   Assessment & Plan:   Traumatic L2 vertebral fracture Acute kidney injury on CKD stage IV with rhabdomyolysis and hyperkalemia, uremia Acute metabolic encephalopathy, multifactorial.  Mostly from uremic symptoms. 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 Adequate pain medications with IV and oral opiates, manage secretions, manage anxiety. Provide end-of-life care Unrestricted visitor policy for end-of-life RN to pronounce death if happens in the hospital Family looking for different inpatient hospice places.  Stable for transfer for end-of-life.  DVT prophylaxis:   Comfort care   Code Status: DNR/DNI, comfort care Family Communication: Brother at the bedside. Disposition Plan: Status is: Inpatient Remains inpatient appropriate because: End-of-life care      Consultants:  Palliative Nephrology Neurosurgery  Procedures:  None  Antimicrobials:  None   Subjective:  Patient seen and examined early morning hours.  Brother was at the bedside.  Patient himself remained lethargic, he tells me that he had poor sleep.  Hurts everywhere.  Objective: Vitals:   10/20/21 0432 10/20/21 0827 10/21/21 0533 10/22/21 0444  BP: (!) 109/54 (!) 105/55 (!) 111/27 (!) 118/57  Pulse: 69 63 60 60  Resp: (!) 32 '16 17 18  '$ Temp:  98.8 F (37.1 C) 97.6 F (36.4 C) 97.6 F (36.4 C)  TempSrc:  Oral Oral Axillary  SpO2: 99% 97% 97% 99%  Weight:      Height:        Intake/Output Summary (Last 24 hours) at 10/22/2021 1113 Last data filed at 10/21/2021 2113 Gross per 24 hour  Intake --  Output 200 ml  Net -200 ml    Filed Weights   10/17/21 0430 10/18/21 0553 10/19/21 0500  Weight: 80.7 kg 82 kg 82.5 kg    Examination:  Very debilitated and sick looking.  Medicated for comfort.  Anasarca.  Lethargic.     Data Reviewed: I have personally reviewed following labs and imaging studies  CBC: Recent Labs  Lab 10/16/21 0457 10/17/21 0427  WBC 13.0* 16.3*  NEUTROABS 10.3*  --   HGB 8.9* 9.7*  HCT 27.4* 30.9*  MCV 84.3 85.1  PLT 226 323    Basic Metabolic Panel: Recent Labs  Lab 10/16/21 0457 10/16/21 1521 10/17/21 0427 10/19/21 0349  NA 153* 151* 149* 141  K 3.5 3.5 3.6 4.0  CL 125* 122* 116* 111  CO2 21* 20* 20* 18*  GLUCOSE 255* 224* 195* 220*  BUN 78* 80* 78* 106*  CREATININE 4.40* 4.83* 5.03* 7.21*  CALCIUM 8.1* 8.1* 8.2* 8.0*  PHOS 3.7  --  4.4  --     GFR: Estimated Creatinine Clearance: 8.7 mL/min (A) (by C-G formula based on SCr of 7.21 mg/dL (H)). Liver Function Tests: Recent Labs  Lab 10/16/21 0457 10/17/21 0427  ALBUMIN <1.5* <1.5*    No results for input(s): "LIPASE", "AMYLASE" in the last 168 hours. No results for input(s): "AMMONIA" in the last 168 hours. Coagulation Profile: No results for input(s):  "INR", "PROTIME" in the last 168 hours. Cardiac Enzymes: No results for input(s): "CKTOTAL", "CKMB", "CKMBINDEX", "TROPONINI" in the last 168 hours.  BNP (last 3 results) No results for input(s): "PROBNP" in the last 8760 hours. HbA1C: No results for input(s): "HGBA1C" in the last 72 hours. CBG: Recent Labs  Lab 10/18/21 0717 10/18/21 1133 10/18/21 1653 10/18/21 2104 10/19/21 0719  GLUCAP 211* 359* 305* 232* 219*    Lipid Profile: No results for input(s): "CHOL", "HDL", "LDLCALC", "TRIG", "CHOLHDL", "LDLDIRECT" in the last 72 hours. Thyroid Function Tests: No results for input(s): "TSH", "T4TOTAL", "FREET4", "T3FREE", "THYROIDAB" in the last 72 hours. Anemia Panel: No results for input(s): "VITAMINB12", "FOLATE", "FERRITIN", "TIBC", "IRON", "RETICCTPCT" in the last 72 hours. Sepsis Labs: No results for input(s): "PROCALCITON", "LATICACIDVEN" in the last 168 hours.  No results found for this or any previous visit (from the past 240 hour(s)).        Radiology Studies: No results found.      Scheduled Meds:  dorzolamide-timolol  1 drop Both Eyes BID   feeding supplement  1 Container Oral TID BM   feeding supplement  237 mL Oral TID BM   latanoprost  1 drop Both Eyes QHS   leptospermum manuka honey  1 application  Topical Daily   lidocaine  1 patch Transdermal Q24H   Continuous Infusions:     LOS: 14 days    Time spent: 35 minutes    Barb Merino, MD [facility] Pager [phone]

## 2021-10-22 NOTE — Progress Notes (Signed)
Gave report to Chasity at [facility].  Waiting on PTAR. AVS printed and on chart.

## 2021-11-10 DEATH — deceased

## 2022-06-27 ENCOUNTER — Ambulatory Visit: Payer: Self-pay | Admitting: Cardiology
